# Patient Record
Sex: Female | Born: 1940
Health system: Southern US, Community
[De-identification: ages and names within clinical notes are randomized; demographics above are authoritative.]

## PROBLEM LIST (undated history)

## (undated) DIAGNOSIS — M1990 Unspecified osteoarthritis, unspecified site: Secondary | ICD-10-CM

## (undated) DIAGNOSIS — Z9889 Other specified postprocedural states: Secondary | ICD-10-CM

## (undated) DIAGNOSIS — I10 Essential (primary) hypertension: Secondary | ICD-10-CM

## (undated) DIAGNOSIS — K621 Rectal polyp: Secondary | ICD-10-CM

## (undated) DIAGNOSIS — K648 Other hemorrhoids: Secondary | ICD-10-CM

## (undated) DIAGNOSIS — Z972 Presence of dental prosthetic device (complete) (partial): Secondary | ICD-10-CM

## (undated) DIAGNOSIS — R112 Nausea with vomiting, unspecified: Secondary | ICD-10-CM

## (undated) DIAGNOSIS — M51379 Other intervertebral disc degeneration, lumbosacral region without mention of lumbar back pain or lower extremity pain: Secondary | ICD-10-CM

## (undated) DIAGNOSIS — D369 Benign neoplasm, unspecified site: Secondary | ICD-10-CM

## (undated) DIAGNOSIS — M5137 Other intervertebral disc degeneration, lumbosacral region: Secondary | ICD-10-CM

## (undated) DIAGNOSIS — D259 Leiomyoma of uterus, unspecified: Secondary | ICD-10-CM

## (undated) DIAGNOSIS — N3010 Interstitial cystitis (chronic) without hematuria: Secondary | ICD-10-CM

## (undated) DIAGNOSIS — K635 Polyp of colon: Secondary | ICD-10-CM

## (undated) DIAGNOSIS — E785 Hyperlipidemia, unspecified: Secondary | ICD-10-CM

## (undated) DIAGNOSIS — R42 Dizziness and giddiness: Secondary | ICD-10-CM

## (undated) DIAGNOSIS — R3989 Other symptoms and signs involving the genitourinary system: Secondary | ICD-10-CM

## (undated) DIAGNOSIS — Z973 Presence of spectacles and contact lenses: Secondary | ICD-10-CM

## (undated) DIAGNOSIS — R001 Bradycardia, unspecified: Secondary | ICD-10-CM

## (undated) HISTORY — DX: Other hemorrhoids: K64.8

## (undated) HISTORY — DX: Other intervertebral disc degeneration, lumbosacral region: M51.37

## (undated) HISTORY — PX: CARPAL TUNNEL RELEASE: SHX101

## (undated) HISTORY — DX: Essential (primary) hypertension: I10

## (undated) HISTORY — DX: Rectal polyp: K62.1

## (undated) HISTORY — DX: Benign neoplasm, unspecified site: D36.9

## (undated) HISTORY — DX: Polyp of colon: K63.5

## (undated) HISTORY — DX: Dizziness and giddiness: R42

## (undated) HISTORY — DX: Hyperlipidemia, unspecified: E78.5

## (undated) HISTORY — DX: Other intervertebral disc degeneration, lumbosacral region without mention of lumbar back pain or lower extremity pain: M51.379

---

## 1986-07-26 HISTORY — PX: DILATION AND CURETTAGE OF UTERUS: SHX78

## 1997-11-29 ENCOUNTER — Ambulatory Visit (HOSPITAL_COMMUNITY): Admission: RE | Admit: 1997-11-29 | Discharge: 1997-11-29 | Payer: Self-pay | Admitting: Family Medicine

## 1999-07-22 ENCOUNTER — Other Ambulatory Visit: Admission: RE | Admit: 1999-07-22 | Discharge: 1999-07-22 | Payer: Self-pay | Admitting: *Deleted

## 1999-08-14 ENCOUNTER — Encounter: Admission: RE | Admit: 1999-08-14 | Discharge: 1999-08-14 | Payer: Self-pay | Admitting: *Deleted

## 1999-08-14 ENCOUNTER — Encounter: Payer: Self-pay | Admitting: Family Medicine

## 2000-08-19 ENCOUNTER — Other Ambulatory Visit: Admission: RE | Admit: 2000-08-19 | Discharge: 2000-08-19 | Payer: Self-pay | Admitting: *Deleted

## 2001-01-22 ENCOUNTER — Emergency Department (HOSPITAL_COMMUNITY): Admission: EM | Admit: 2001-01-22 | Discharge: 2001-01-22 | Payer: Self-pay | Admitting: Emergency Medicine

## 2001-02-27 ENCOUNTER — Encounter: Payer: Self-pay | Admitting: Urology

## 2001-02-27 ENCOUNTER — Encounter: Admission: RE | Admit: 2001-02-27 | Discharge: 2001-02-27 | Payer: Self-pay | Admitting: Urology

## 2001-09-26 ENCOUNTER — Other Ambulatory Visit: Admission: RE | Admit: 2001-09-26 | Discharge: 2001-09-26 | Payer: Self-pay | Admitting: Obstetrics & Gynecology

## 2003-03-25 ENCOUNTER — Other Ambulatory Visit: Admission: RE | Admit: 2003-03-25 | Discharge: 2003-03-25 | Payer: Self-pay | Admitting: Obstetrics & Gynecology

## 2004-04-12 ENCOUNTER — Encounter: Admission: RE | Admit: 2004-04-12 | Discharge: 2004-04-12 | Payer: Self-pay | Admitting: Family Medicine

## 2004-06-15 ENCOUNTER — Other Ambulatory Visit: Admission: RE | Admit: 2004-06-15 | Discharge: 2004-06-15 | Payer: Self-pay | Admitting: Obstetrics & Gynecology

## 2004-06-23 ENCOUNTER — Emergency Department: Payer: Self-pay | Admitting: General Practice

## 2004-07-01 ENCOUNTER — Encounter: Admission: RE | Admit: 2004-07-01 | Discharge: 2004-07-01 | Payer: Self-pay | Admitting: Chiropractic Medicine

## 2005-01-22 ENCOUNTER — Ambulatory Visit (HOSPITAL_COMMUNITY): Admission: RE | Admit: 2005-01-22 | Discharge: 2005-01-22 | Payer: Self-pay | Admitting: Obstetrics & Gynecology

## 2005-10-13 ENCOUNTER — Other Ambulatory Visit: Admission: RE | Admit: 2005-10-13 | Discharge: 2005-10-13 | Payer: Self-pay | Admitting: Obstetrics & Gynecology

## 2006-01-27 ENCOUNTER — Emergency Department (HOSPITAL_COMMUNITY): Admission: EM | Admit: 2006-01-27 | Discharge: 2006-01-27 | Payer: Self-pay | Admitting: Emergency Medicine

## 2006-02-14 ENCOUNTER — Emergency Department: Payer: Self-pay | Admitting: Emergency Medicine

## 2007-06-26 ENCOUNTER — Encounter: Payer: Self-pay | Admitting: Family Medicine

## 2007-07-27 HISTORY — PX: COLONOSCOPY W/ POLYPECTOMY: SHX1380

## 2007-11-20 ENCOUNTER — Encounter: Payer: Self-pay | Admitting: Family Medicine

## 2008-09-30 ENCOUNTER — Encounter: Payer: Self-pay | Admitting: Family Medicine

## 2009-02-14 ENCOUNTER — Encounter: Payer: Self-pay | Admitting: Family Medicine

## 2009-05-30 ENCOUNTER — Encounter: Payer: Self-pay | Admitting: Family Medicine

## 2009-05-30 ENCOUNTER — Encounter: Admission: RE | Admit: 2009-05-30 | Discharge: 2009-05-30 | Payer: Self-pay | Admitting: Orthopedic Surgery

## 2009-07-16 ENCOUNTER — Ambulatory Visit: Payer: Self-pay | Admitting: Family Medicine

## 2009-07-16 DIAGNOSIS — I10 Essential (primary) hypertension: Secondary | ICD-10-CM | POA: Insufficient documentation

## 2009-07-16 DIAGNOSIS — R42 Dizziness and giddiness: Secondary | ICD-10-CM

## 2009-07-16 DIAGNOSIS — E785 Hyperlipidemia, unspecified: Secondary | ICD-10-CM | POA: Insufficient documentation

## 2009-07-16 DIAGNOSIS — H6120 Impacted cerumen, unspecified ear: Secondary | ICD-10-CM

## 2009-10-03 ENCOUNTER — Ambulatory Visit: Payer: Self-pay | Admitting: Family Medicine

## 2009-10-03 DIAGNOSIS — E559 Vitamin D deficiency, unspecified: Secondary | ICD-10-CM

## 2009-10-06 LAB — CONVERTED CEMR LAB
ALT: 18 units/L (ref 0–35)
Albumin: 3.9 g/dL (ref 3.5–5.2)
Alkaline Phosphatase: 51 units/L (ref 39–117)
BUN: 12 mg/dL (ref 6–23)
Bilirubin, Direct: 0 mg/dL (ref 0.0–0.3)
CO2: 30 meq/L (ref 19–32)
Chloride: 103 meq/L (ref 96–112)
Creatinine, Ser: 0.6 mg/dL (ref 0.4–1.2)
Direct LDL: 134.5 mg/dL
GFR calc non Af Amer: 106.08 mL/min (ref >60)
Glucose, Bld: 101 mg/dL — ABNORMAL HIGH (ref 70–99)
HDL: 41.8 mg/dL (ref >39.00)
Triglycerides: 225 mg/dL — ABNORMAL HIGH (ref 0.0–149.0)
Vit D, 25-Hydroxy: 41 ng/mL (ref 30–89)

## 2009-11-18 ENCOUNTER — Ambulatory Visit (HOSPITAL_BASED_OUTPATIENT_CLINIC_OR_DEPARTMENT_OTHER): Admission: RE | Admit: 2009-11-18 | Discharge: 2009-11-18 | Payer: Self-pay | Admitting: Orthopedic Surgery

## 2009-11-18 HISTORY — PX: KNEE ARTHROSCOPY: SUR90

## 2009-11-28 LAB — HM DEXA SCAN: HM Dexa Scan: NORMAL

## 2009-12-15 ENCOUNTER — Ambulatory Visit: Payer: Self-pay | Admitting: Family Medicine

## 2009-12-15 DIAGNOSIS — J018 Other acute sinusitis: Secondary | ICD-10-CM

## 2010-01-15 ENCOUNTER — Telehealth: Payer: Self-pay | Admitting: Family Medicine

## 2010-02-26 ENCOUNTER — Ambulatory Visit: Payer: Self-pay | Admitting: Family Medicine

## 2010-02-26 LAB — CONVERTED CEMR LAB
Specific Gravity, Urine: 1.01
Urobilinogen, UA: 0.2
WBC Urine, dipstick: NEGATIVE

## 2010-03-02 DIAGNOSIS — R300 Dysuria: Secondary | ICD-10-CM

## 2010-03-03 ENCOUNTER — Encounter: Payer: Self-pay | Admitting: Family Medicine

## 2010-03-05 ENCOUNTER — Encounter: Payer: Self-pay | Admitting: Family Medicine

## 2010-03-12 ENCOUNTER — Encounter: Admission: RE | Admit: 2010-03-12 | Discharge: 2010-03-12 | Payer: Self-pay | Admitting: Obstetrics & Gynecology

## 2010-04-06 ENCOUNTER — Ambulatory Visit: Payer: Self-pay | Admitting: Family Medicine

## 2010-04-06 DIAGNOSIS — G4700 Insomnia, unspecified: Secondary | ICD-10-CM

## 2010-04-06 HISTORY — DX: Insomnia, unspecified: G47.00

## 2010-04-06 LAB — CONVERTED CEMR LAB
Bilirubin Urine: NEGATIVE
Glucose, Urine, Semiquant: NEGATIVE
Ketones, urine, test strip: NEGATIVE
Nitrite: NEGATIVE
Protein, U semiquant: NEGATIVE

## 2010-04-22 ENCOUNTER — Encounter: Payer: Self-pay | Admitting: Family Medicine

## 2010-05-09 ENCOUNTER — Emergency Department (HOSPITAL_COMMUNITY): Admission: EM | Admit: 2010-05-09 | Discharge: 2010-05-09 | Payer: Self-pay | Admitting: Emergency Medicine

## 2010-05-12 ENCOUNTER — Ambulatory Visit: Payer: Self-pay | Admitting: Family Medicine

## 2010-05-27 ENCOUNTER — Telehealth: Payer: Self-pay | Admitting: Family Medicine

## 2010-07-03 ENCOUNTER — Telehealth: Payer: Self-pay | Admitting: Family Medicine

## 2010-08-07 ENCOUNTER — Telehealth: Payer: Self-pay | Admitting: Family Medicine

## 2010-08-16 ENCOUNTER — Encounter: Payer: Self-pay | Admitting: Orthopedic Surgery

## 2010-08-27 ENCOUNTER — Other Ambulatory Visit: Payer: Self-pay | Admitting: Obstetrics & Gynecology

## 2010-08-27 NOTE — Assessment & Plan Note (Signed)
Summary: PROBLEMS WITH HIGH BP / LFW   Vital Signs:  Patient profile:   70 year old female Height:      65.5 inches Weight:      208 pounds BMI:     34.21 Temp:     97.8 degrees F oral Pulse rate:   68 / minute Pulse rhythm:   regular BP sitting:   152 / 90  (left arm) Cuff size:   large  Vitals Entered By: Linde Gillis CMA Duncan Dull) (May 12, 2010 10:57 AM) CC: elevated blood pressure   History of Present Illness: 70 yo here for ER follow up.  Felt funny on 10/15, checked her BP at home and it was 190s/100.  Called EMS, taken to [facility].  Had a bad headache and felt a little disoriented.  No CP, SOB, nausea or vomiting.  No focal neurological deficits.  On arrival to ED BP was 189/94. Notes reviewed, BP trended down to 118/48 without additional medicaiton. Head CT was negative.  Has been on HCTZ 25 mg for some time.  This has never happened to her before.  Earlier in the day, prior to developping symptoms, received steroid shots in her knees at her Orthopedist office (Dr. Simonne Come).  Never had steroid injections in past.    HA has now resolved. Checking her BP every day, this morning was 127/74.  Current Medications (verified): 1)  Hydrochlorothiazide 25 Mg Tabs (Hydrochlorothiazide) .... Take One Tablet By Mouth Daily 2)  Valium 5 Mg Tabs (Diazepam) .... Take One Tablet By Mouth As Needed For Vertigo 3)  Tylenol Extra Strength 1000 Mg/48ml Liqd (Acetaminophen) .... Take One Tablet By Mouth As Needed 4)  Tramadol Hcl 50 Mg Tabs (Tramadol Hcl) .... As Needed For Pain (Knee) 5)  Meclizine Hcl 25 Mg Tabs (Meclizine Hcl) .Marland Kitchen.. 1 By Mouth Two Times A Day As Needed Vertigo 6)  Lunesta 2 Mg Tabs (Eszopiclone) .Marland Kitchen.. 1 Tab Nightly As Needed For Insomnia. 7)  Hydrocodone-Acetaminophen 7.5-500 Mg Tabs (Hydrocodone-Acetaminophen) .... Take One Tablet By Mouth Every 4-6 Hours As Needed Pain  Allergies: 1)  ! Sulfa 2)  ! Codeine  Past History:  Past Medical History: Last updated:  08/15/2009 Hyperlipidemia Hypertension Vertigo Right knee meniscal tear  Past Surgical History: Last updated: 2009/08/15 Denies surgical history  Family History: Last updated: August 15, 2009 Mom died at 53 of PE Dad died at 1, MI, COPD  Social History: Last updated: Aug 15, 2009 Lives with husband in [place]. Quit smoking when she was 70 yo. Married Alcohol use-no Drug use-no  Review of Systems      See HPI General:  Denies malaise. Eyes:  Denies blurring. CV:  Denies chest pain or discomfort. Resp:  Denies pleuritic. Neuro:  Denies headaches, inability to speak, numbness, poor balance, seizures, sensation of room spinning, tremors, visual disturbances, and weakness.  Physical Exam  General:  Well-developed,well-nourished,in no acute distress; alert,appropriate and cooperative throughout examination slightly hypertensive Lungs:  Normal respiratory effort, chest expands symmetrically. Lungs are clear to auscultation, no crackles or wheezes. Heart:  Normal rate and regular rhythm. S1 and S2 normal without gallop, murmur, click, rub or other extra sounds. Extremities:  No clubbing, cyanosis, edema, or deformity noted with normal full range of motion of all joints.   Neurologic:  No cranial nerve deficits noted. Station and gait are normal. Plantar reflexes are down-going bilaterally. DTRs are symmetrical throughout. Sensory, motor and coordinative functions appear intact. Psych:  Cognition and judgment appear intact. Alert and cooperative with normal attention span  and concentration. No apparent delusions, illusions, hallucinations   Impression & Recommendations:  Problem # 1:  HYPERTENSION (ICD-401.9) Assessment Deteriorated ER notes reviewed, normotensive prior to D/c and has been normotensive at home. Has h/o white coat HTN. I suspect this incident of increased BP was driven by a reaciton to the steroid shots she received at orthopedist.  Will call for records and add meds  to her allergy list.  Continue current dose of HCTZ. Pt to call on Friday with update of BPs. Her updated medication list for this problem includes:    Hydrochlorothiazide 25 Mg Tabs (Hydrochlorothiazide) .Marland Kitchen... Take one tablet by mouth daily  Complete Medication List: 1)  Hydrochlorothiazide 25 Mg Tabs (Hydrochlorothiazide) .... Take one tablet by mouth daily 2)  Valium 5 Mg Tabs (Diazepam) .... Take one tablet by mouth as needed for vertigo 3)  Tylenol Extra Strength 1000 Mg/14ml Liqd (Acetaminophen) .... Take one tablet by mouth as needed 4)  Tramadol Hcl 50 Mg Tabs (Tramadol hcl) .... As needed for pain (knee) 5)  Meclizine Hcl 25 Mg Tabs (Meclizine hcl) .Marland Kitchen.. 1 by mouth two times a day as needed vertigo 6)  Lunesta 2 Mg Tabs (Eszopiclone) .Marland Kitchen.. 1 tab nightly as needed for insomnia. 7)  Hydrocodone-acetaminophen 7.5-500 Mg Tabs (Hydrocodone-acetaminophen) .... Take one tablet by mouth every 4-6 hours as needed pain   Orders Added: 1)  Est. Patient Level IV [04540]    Current Allergies (reviewed today): ! SULFA ! CODEINE

## 2010-08-27 NOTE — Assessment & Plan Note (Signed)
Summary: TROUBLE SLEEPING,/CLE   Vital Signs:  Patient profile:   70 year old female Height:      65.5 inches Weight:      211 pounds BMI:     34.70 Temp:     97.7 degrees F oral Pulse rate:   72 / minute Pulse rhythm:   regular BP sitting:   140 / 90  (left arm) Cuff size:   large  Vitals Entered By: Linde Gillis CMA Duncan Dull) (April 06, 2010 10:52 AM) CC: trouble sleeping, ?UTI   History of Present Illness: 70 yo here to discuss several issues.  1.  ?UTI- over month 1/2 of dysuria, suprapubic pressure.  Has been seeing Dr. Jennette Kettle, OBGYN.  UAs at Dr. Donnetta Hail office and here have been negative.  No hematuria, fevers, back pain, nausea or vomiting. Dr. Jennette Kettle gave her topical estrogen which has not helped.  2.  Trouble sleeping- cannot fall or stay asleep.  Once she is asleep, she wakes up to use the bathroom and cannot fall back asleep.  Valium does not help.  Knows part of the issue is that she is fearful that something is wrong with her, like bladder cancer. Former smoker.  Current Medications (verified): 1)  Hydrochlorothiazide 25 Mg Tabs (Hydrochlorothiazide) .... Take One Tablet By Mouth Daily 2)  Valium 5 Mg Tabs (Diazepam) .... Take One Tablet By Mouth As Needed For Vertigo 3)  Tylenol Extra Strength 1000 Mg/74ml Liqd (Acetaminophen) .... Take One Tablet By Mouth As Needed 4)  Tramadol Hcl 50 Mg Tabs (Tramadol Hcl) .... As Needed For Pain (Knee) 5)  Meclizine Hcl 25 Mg Tabs (Meclizine Hcl) .Marland Kitchen.. 1 By Mouth Two Times A Day As Needed Vertigo 6)  Lunesta 2 Mg Tabs (Eszopiclone) .Marland Kitchen.. 1 Tab Nightly As Needed For Insomnia.  Allergies: 1)  ! Sulfa 2)  ! Codeine  Past History:  Past Medical History: Last updated: 08-14-2009 Hyperlipidemia Hypertension Vertigo Right knee meniscal tear  Past Surgical History: Last updated: 08-14-09 Denies surgical history  Family History: Last updated: 08-14-09 Mom died at 45 of PE Dad died at 48, MI, COPD  Social History: Last  updated: 2009-08-14 Lives with husband in [place]. Quit smoking when she was 70 yo. Married Alcohol use-no Drug use-no  Review of Systems      See HPI General:  Denies chills and fever. GI:  Complains of abdominal pain; denies nausea and vomiting. GU:  Complains of dysuria, nocturia, and urinary frequency; denies hematuria, incontinence, and urinary hesitancy. Psych:  Denies sense of great danger, suicidal thoughts/plans, thoughts of violence, unusual visions or sounds, and thoughts /plans of harming others.  Physical Exam  General:  Well-developed,well-nourished,in no acute distress; alert,appropriate and cooperative throughout examination slightly hypertensive Lungs:  Normal respiratory effort, chest expands symmetrically. Lungs are clear to auscultation, no crackles or wheezes. Heart:  Normal rate and regular rhythm. S1 and S2 normal without gallop, murmur, click, rub or other extra sounds. Abdomen:  Bowel sounds positive,abdomen soft and non-tender without masses, organomegaly or hernias noted. NO CVA or suprapubic tenderness Psych:  Cognition and judgment appear intact. Alert and cooperative with normal attention span and concentration. No apparent delusions, illusions, hallucinations   Impression & Recommendations:  Problem # 1:  DYSURIA (ICD-788.1) Assessment Deteriorated UA again negative. Chronic issue, will refer to urology for further workup and possible urodynamics/cystoscopy. ?possible IC. Orders: UA Dipstick w/o Micro (manual) (16109) Urology Referral (Urology)  Problem # 2:  INSOMNIA (ICD-780.52) Assessment: New Chronic issue that is worsened  by above. Will give short course of Lunesta until our urology work up is complete.  Pt in agreement with plan. Discussed sleep hygiene as well. Her updated medication list for this problem includes:    Lunesta 2 Mg Tabs (Eszopiclone) .Marland Kitchen... 1 tab nightly as needed for insomnia.  Complete Medication List: 1)   Hydrochlorothiazide 25 Mg Tabs (Hydrochlorothiazide) .... Take one tablet by mouth daily 2)  Valium 5 Mg Tabs (Diazepam) .... Take one tablet by mouth as needed for vertigo 3)  Tylenol Extra Strength 1000 Mg/31ml Liqd (Acetaminophen) .... Take one tablet by mouth as needed 4)  Tramadol Hcl 50 Mg Tabs (Tramadol hcl) .... As needed for pain (knee) 5)  Meclizine Hcl 25 Mg Tabs (Meclizine hcl) .Marland Kitchen.. 1 by mouth two times a day as needed vertigo 6)  Lunesta 2 Mg Tabs (Eszopiclone) .Marland Kitchen.. 1 tab nightly as needed for insomnia.  Patient Instructions: 1)  Please stop by to see Shirlee Limerick on your way out. Prescriptions: LUNESTA 2 MG TABS (ESZOPICLONE) 1 tab nightly as needed for insomnia.  #30 x 0   Entered and Authorized by:   Ruthe Mannan MD   Signed by:   Ruthe Mannan MD on 04/06/2010   Method used:   Print then Give to Patient   RxID:   [id number]   Current Allergies (reviewed today): ! SULFA ! CODEINE Laboratory Results   Urine Tests  Date/Time Received: April 06, 2010 11:10 AM   Routine Urinalysis   Color: yellow Appearance: Clear Glucose: negative   (Normal Range: Negative) Bilirubin: negative   (Normal Range: Negative) Ketone: negative   (Normal Range: Negative) Spec. Gravity: 1.010   (Normal Range: 1.003-1.035) Blood: negative   (Normal Range: Negative) pH: 5.0   (Normal Range: 5.0-8.0) Protein: negative   (Normal Range: Negative) Urobilinogen: 0.2   (Normal Range: 0-1) Nitrite: negative   (Normal Range: Negative) Leukocyte Esterace: negative   (Normal Range: Negative)        Last PAP:  normal (04/02/2009 2:31:54 PM) PAP Result Date:  03/11/2010 PAP Result:  normal Last Mammogram:  normal (03/27/2009 2:31:54 PM) Mammogram Result Date:  03/10/2010 Mammogram Result:  normal Bone Density Result Date:  03/05/2010 Bone Density Result:  normal

## 2010-08-27 NOTE — Progress Notes (Signed)
Summary: Meclizine  Phone Note Call from Patient Call back at Home Phone [phone] Call back at Cell:  [phone]   Caller: Patient Call For: Ruthe Mannan MD Summary of Call: Patient says you have not prescribed Meclizine in the past but you are aware that she takes it for dizziness.  She is down to 1 tablet left.  [facility]. Initial call taken by: Delilah Shan CMA Duncan Dull),  January 15, 2010 9:47 AM    New/Updated Medications: MECLIZINE HCL 25 MG TABS (MECLIZINE HCL) 1 by mouth two times a day as needed vertigo Prescriptions: MECLIZINE HCL 25 MG TABS (MECLIZINE HCL) 1 by mouth two times a day as needed vertigo  #60 x 0   Entered and Authorized by:   Ruthe Mannan MD   Signed by:   Ruthe Mannan MD on 01/15/2010   Method used:   [facility]ronically to        [facility] [id number] (retail)       [address]       [place], [place]  [address]       Ph: [phone] or [phone]       Fax: [phone]   RxID:   [id number]   Appended Document: Meclizine Patient Advised.

## 2010-08-27 NOTE — Letter (Signed)
Summary: Records from Dr. Harlene Salts Office 2005 - 2010  Records from Dr. Harlene Salts Office 2005 - 2010   Imported By: Maryln Gottron 03/06/2010 13:18:44  _____________________________________________________________________  External Attachment:    Type:   Image     Comment:   External Document

## 2010-08-27 NOTE — Assessment & Plan Note (Signed)
Summary: CPX/RBH   Vital Signs:  Patient profile:   70 year old female Height:      65.5 inches Weight:      214.13 pounds BMI:     35.22 Temp:     97.7 degrees F oral Pulse rate:   84 / minute Pulse rhythm:   regular BP sitting:   122 / 82  (left arm) Cuff size:   large  Vitals Entered By: Delilah Shan CMA Duncan Dull) (October 03, 2009 9:12 AM) CC: CPX   History of Present Illness: 70 yo here to for CPX.  HTN- on HCTZ 25 mg.  BPs were elevated at last OV, much improved today.  She has lost 4 pounds! and is working on exercising more.  Has been under great deal of stress since her son had an accident in January and she has been helping to rehabilitate him.   No CP, SOB, HA or blurred vision.  Hyperlipidem- was on Lipitor. Gave her myalgias and nausea.   Not taking anything right now.  Due for FLP.  Right knee meniscal tear- see scanned MRI report. Was supposed to have surgical repair in February but rescheduled for next month due to son's accident.    Well woman- UTD on mammogram, bone scan, pap smear (sees Dr. Jennette Kettle).   She wants to wait for colonoscopy until after knee surgery.   We do not have Zostavax available until June.  Current Medications (verified): 1)  Hydrochlorothiazide 25 Mg Tabs (Hydrochlorothiazide) .... Take One Tablet By Mouth Daily 2)  Valium 5 Mg Tabs (Diazepam) .... Take One Tablet By Mouth As Needed For Vertigo 3)  Tylenol Extra Strength 1000 Mg/44ml Liqd (Acetaminophen) .... Take One Tablet By Mouth As Needed 4)  Tramadol Hcl 50 Mg Tabs (Tramadol Hcl) .... As Needed For Pain (Knee) 5)  Meclizine Hcl 25 Mg Tabs (Meclizine Hcl) .... Occasionally For Vertigo  Allergies: 1)  ! Sulfa 2)  ! Codeine  Family History: Reviewed history from 07/16/2009 and no changes required. Mom died at 17 of PE Dad died at 11, MI, COPD  Social History: Reviewed history from 07/16/2009 and no changes required. Lives with husband in [place]. Quit smoking when she was 70  yo. Married Alcohol use-no Drug use-no  Review of Systems      See HPI General:  Denies chills, fever, and malaise. CV:  Denies chest pain or discomfort. Resp:  Denies shortness of breath. GI:  Denies abdominal pain. MS:  Denies muscle weakness. Derm:  Denies rash. Neuro:  Denies sensation of room spinning and visual disturbances. Psych:  Denies anxiety and depression.  Physical Exam  General:  Well-developed,well-nourished,in no acute distress; alert,appropriate and cooperative throughout examination Vs- lost 4 pounds, BP great! Ears:  R ear normal and L ear normal.   Mouth:  Oral mucosa and oropharynx without lesions or exudates.  Teeth in good repair. Lungs:  Normal respiratory effort, chest expands symmetrically. Lungs are clear to auscultation, no crackles or wheezes. Heart:  Normal rate and regular rhythm. S1 and S2 normal without gallop, murmur, click, rub or other extra sounds. Abdomen:  Bowel sounds positive,abdomen soft and non-tender without masses, organomegaly or hernias noted. Extremities:  No clubbing, cyanosis, edema, or deformity noted with normal full range of motion of all joints.   Skin:  Intact without suspicious lesions or rashes Psych:  Cognition and judgment appear intact. Alert and cooperative with normal attention span and concentration. No apparent delusions, illusions, hallucinations   Impression & Recommendations:  Problem # 1:  HYPERTENSION (ICD-401.9) Assessment Improved Congratulated her on weight loss and changes in diet. Looks great. Her updated medication list for this problem includes:    Hydrochlorothiazide 25 Mg Tabs (Hydrochlorothiazide) .Marland Kitchen... Take one tablet by mouth daily  Orders: TLB-BMP (Basic Metabolic Panel-BMET) (80048-METABOL)  Problem # 2:  HYPERLIPIDEMIA (ICD-272.4) Assessment: Unchanged Recheck Lipid panel, LFTs today.  Will likely need to start a statin, perhaps she will tolerate Pravachol better than  Lipitor. Orders: Venipuncture ([address]) TLB-Lipid Panel (80061-LIPID) TLB-Hepatic/Liver Function Pnl (80076-HEPATIC)  Problem # 3:  Preventive Health Care (ICD-V70.0) Reviewed preventive care protocols, scheduled due services, and updated immunizations Discussed nutrition, exercise, diet, and healthy lifestyle.  FLP, BMET today. Discussed colonoscopy, she would like to wait until after she has her knee surgery. Will need Zostavax when we get them.   Problem # 4:  VITAMIN D DEFICIENCY (ICD-268.9) Assessment: Unchanged Will recheck Vit D today, has been taking OTC supplements, she can not recall exact amount.  she will call back when she gets home with those details. Orders: Venipuncture (60454) T-Vitamin D (25-Hydroxy) (09811-91478) Specimen Handling (29562)  Complete Medication List: 1)  Hydrochlorothiazide 25 Mg Tabs (Hydrochlorothiazide) .... Take one tablet by mouth daily 2)  Valium 5 Mg Tabs (Diazepam) .... Take one tablet by mouth as needed for vertigo 3)  Tylenol Extra Strength 1000 Mg/51ml Liqd (Acetaminophen) .... Take one tablet by mouth as needed 4)  Tramadol Hcl 50 Mg Tabs (Tramadol hcl) .... As needed for pain (knee) 5)  Meclizine Hcl 25 Mg Tabs (Meclizine hcl) .... Occasionally for vertigo  Current Allergies (reviewed today): ! SULFA ! CODEINE

## 2010-08-27 NOTE — Progress Notes (Signed)
Summary: refill request for lunesta  Phone Note Refill Request Call back at [facility] Phone [phone] Call back at [phone] Message from:  Patient  Refills Requested: Medication #1:  LUNESTA 2 MG TABS 1 tab nightly as needed for insomnia. Pt states she is waking too early with this dose.  She tried her husbands 3 mg's and that worked better for her. She would like to increase to 3 mg's.   [facility] [place]  Initial call taken by: Lowella Petties CMA, AAMA,  May 27, 2010 4:51 PM  Follow-up for Phone Call        Rx called to pharmacy, patient notified. Follow-up by: Linde Gillis CMA Duncan Dull),  May 28, 2010 8:09 AM    New/Updated Medications: LUNESTA 3 MG TABS (ESZOPICLONE) one by mouth at bedtime as needed Prescriptions: LUNESTA 3 MG TABS (ESZOPICLONE) one by mouth at bedtime as needed  #30 x 0   Entered by:   Linde Gillis CMA (AAMA)   Authorized by:   Ruthe Mannan MD   Signed by:   Linde Gillis CMA (AAMA) on 05/28/2010   Method used:   Telephoned to ...       [facility] [phone]* (retail)       [address]       [place], [place]  [address]       Ph: [phone] or [phone]       Fax: [phone]   RxID:   [id number]

## 2010-08-27 NOTE — Assessment & Plan Note (Signed)
Summary: CHECK URINE  Nurse Visit   Vital Signs:  Patient profile:   70 year old female Height:      65.5 inches Temp:     98.4 degrees F oral Pulse rate:   72 / minute Pulse rhythm:   regular  Vitals Entered By: Linde Gillis CMA Duncan Dull) (February 26, 2010 3:35 PM) CC: ? UTI  Patient stated she was having a little burning upon urination that started about three days ago and wanted to make sure she didn't have a UTI.  Urine was clear, no signs of infection.  Advised patient to drink plenty of water, cranberry juice.  Advised her that we would send urine for culture and we would call her when results are in.  Advised her to call us back if symptoms worsen or new symptoms develop.  Linde Gillis CMA Duncan Dull)  February 26, 2010 3:37 PM    Allergies: 1)  ! Sulfa 2)  ! Codeine Laboratory Results   Urine Tests  Date/Time Received: February 26, 2010 3:37 PM   Routine Urinalysis   Color: lt. yellow Appearance: Clear Glucose: negative   (Normal Range: Negative) Bilirubin: negative   (Normal Range: Negative) Ketone: negative   (Normal Range: Negative) Spec. Gravity: 1.010   (Normal Range: 1.003-1.035) Blood: negative   (Normal Range: Negative) pH: 5.0   (Normal Range: 5.0-8.0) Protein: negative   (Normal Range: Negative) Urobilinogen: 0.2   (Normal Range: 0-1) Nitrite: negative   (Normal Range: Negative) Leukocyte Esterace: negative   (Normal Range: Negative)       Appended Document: CHECK URINE

## 2010-08-27 NOTE — Progress Notes (Signed)
Summary: lunesta  Phone Note Refill Request Message from:  Fax from Pharmacy on August 07, 2010 11:32 AM  Refills Requested: Medication #1:  LUNESTA 3 MG TABS one by mouth at bedtime as needed   Last Refilled: 07/03/2010 [phone]  Initial call taken by: Melody Comas,  August 07, 2010 11:32 AM  Follow-up for Phone Call        Rx called to pharmacy Follow-up by: Linde Gillis CMA Duncan Dull),  August 07, 2010 12:04 PM    Prescriptions: LUNESTA 3 MG TABS (ESZOPICLONE) one by mouth at bedtime as needed  #30 x 0   Entered and Authorized by:   Ruthe Mannan MD   Signed by:   Ruthe Mannan MD on 08/07/2010   Method used:   Telephoned to ...       [facility] [id number]* (retail)       [address]       [place], [place]  [address]       Ph: [phone] or [phone]       Fax: [phone]   RxID:   [id number]

## 2010-08-27 NOTE — Assessment & Plan Note (Signed)
Summary: STREP??   Vital Signs:  Patient profile:   70 year old female Height:      65.5 inches Weight:      214.50 pounds BMI:     35.28 Temp:     99.0 degrees F oral Pulse rate:   68 / minute Pulse rhythm:   regular BP sitting:   142 / 80  (right arm) Cuff size:   large  Vitals Entered By: Linde Gillis CMA Duncan Dull) (Dec 15, 2009 2:54 PM) CC: ? Strep throat   History of Present Illness: 70 yo here for ? strep.  Over 1 week ago was around her grandson that had strep throat. Since that time, she has had worsening nasal congestion, dry cough, sinus pressure. Subjective fever, body aches. Pain with swallowing. No nausea, vomiting or diarrhea. No rash.  Current Medications (verified): 1)  Hydrochlorothiazide 25 Mg Tabs (Hydrochlorothiazide) .... Take One Tablet By Mouth Daily 2)  Valium 5 Mg Tabs (Diazepam) .... Take One Tablet By Mouth As Needed For Vertigo 3)  Tylenol Extra Strength 1000 Mg/38ml Liqd (Acetaminophen) .... Take One Tablet By Mouth As Needed 4)  Tramadol Hcl 50 Mg Tabs (Tramadol Hcl) .... As Needed For Pain (Knee) 5)  Meclizine Hcl 25 Mg Tabs (Meclizine Hcl) .... Occasionally For Vertigo 6)  Amoxicillin 500 Mg Tabs (Amoxicillin) .Marland Kitchen.. 1 Tab By Mouth Two Times A Day X 10 Days  Allergies: 1)  ! Sulfa 2)  ! Codeine  Review of Systems      See HPI General:  Complains of chills and fever. ENT:  Complains of postnasal drainage, sinus pressure, and sore throat. Resp:  Complains of cough; denies shortness of breath, sputum productive, and wheezing.  Physical Exam  General:  Well-developed,well-nourished,in no acute distress; alert,appropriate and cooperative throughout examination Vs- lost 4 pounds, BP great! Ears:  TMs retracted bilaterally Nose:  boggy turbinates Mouth:  pharyngeal erythema.   Lungs:  Normal respiratory effort, chest expands symmetrically. Lungs are clear to auscultation, no crackles or wheezes. Heart:  Normal rate and regular rhythm. S1 and  S2 normal without gallop, murmur, click, rub or other extra sounds. Psych:  Cognition and judgment appear intact. Alert and cooperative with normal attention span and concentration. No apparent delusions, illusions, hallucinations   Impression & Recommendations:  Problem # 1:  OTHER ACUTE SINUSITIS (ICD-461.8) Assessment New  Given duration and progression of symptoms ,will treat for bacterial sinusitis with amoxicillin. See pt instructions for details. Her updated medication list for this problem includes:    Amoxicillin 500 Mg Tabs (Amoxicillin) .Marland Kitchen... 1 tab by mouth two times a day x 10 days  Orders: Prescription Created Electronically [phone])  Complete Medication List: 1)  Hydrochlorothiazide 25 Mg Tabs (Hydrochlorothiazide) .... Take one tablet by mouth daily 2)  Valium 5 Mg Tabs (Diazepam) .... Take one tablet by mouth as needed for vertigo 3)  Tylenol Extra Strength 1000 Mg/29ml Liqd (Acetaminophen) .... Take one tablet by mouth as needed 4)  Tramadol Hcl 50 Mg Tabs (Tramadol hcl) .... As needed for pain (knee) 5)  Meclizine Hcl 25 Mg Tabs (Meclizine hcl) .... Occasionally for vertigo 6)  Amoxicillin 500 Mg Tabs (Amoxicillin) .Marland Kitchen.. 1 tab by mouth two times a day x 10 days  Patient Instructions: 1)  Take Amoxcillin as directed.  Drink lots of fluids.  Treat sympotmatically with Mucinex, nasal saline irrigation, and Tylenol/Ibuprofen. You can use warm compresses.  Cough suppressant at night. Call if not improving as expected in 5-7 days.  Prescriptions: AMOXICILLIN 500 MG TABS (AMOXICILLIN) 1 tab by mouth two times a day x 10 days  #20 x 0   Entered and Authorized by:   Ruthe Mannan MD   Signed by:   Ruthe Mannan MD on 12/15/2009   Method used:   [facility] to        [facility] [id number] (retail)       [address]       [place], [place]  [address]       Ph: [phone] or [phone]       Fax: [phone]   RxID:   [id number]   Current Allergies (reviewed  today): ! SULFA ! CODEINE

## 2010-08-27 NOTE — Progress Notes (Signed)
Summary: refill request for lunesta  Phone Note Refill Request Call back at [phone] Message from:  Patient  Refills Requested: Medication #1:  LUNESTA 3 MG TABS one by mouth at bedtime as needed [facility] [place] [phone]  Initial call taken by: Lowella Petties CMA, AAMA,  July 03, 2010 12:43 PM  Follow-up for Phone Call        Rx called to pharmacy Follow-up by: Linde Gillis CMA Duncan Dull),  July 03, 2010 12:53 PM    Prescriptions: LUNESTA 3 MG TABS (ESZOPICLONE) one by mouth at bedtime as needed  #30 x 0   Entered and Authorized by:   Ruthe Mannan MD   Signed by:   Ruthe Mannan MD on 07/03/2010   Method used:   Telephoned to ...       [facility] [id number]* (retail)       [address]       [place], [place]  [address]       Ph: [phone] or [phone]       Fax: [phone]   RxID:   [id number]

## 2010-09-01 ENCOUNTER — Encounter: Payer: Self-pay | Admitting: Family Medicine

## 2010-09-01 ENCOUNTER — Ambulatory Visit
Admission: RE | Admit: 2010-09-01 | Discharge: 2010-09-01 | Disposition: A | Discharge status: Home / Self Care | Payer: Medicare Other | Source: Ambulatory Visit | Attending: Obstetrics & Gynecology | Admitting: Obstetrics & Gynecology

## 2010-09-01 MED ORDER — GADOBENATE DIMEGLUMINE 529 MG/ML IV SOLN
19.0000 mL | Freq: Once | INTRAVENOUS | Status: AC | PRN
  Administered 2010-09-01: 19 mL via INTRAVENOUS

## 2010-09-14 ENCOUNTER — Encounter: Payer: Self-pay | Admitting: Family Medicine

## 2010-09-17 ENCOUNTER — Ambulatory Visit (INDEPENDENT_AMBULATORY_CARE_PROVIDER_SITE_OTHER): Payer: Medicare Other | Admitting: Family Medicine

## 2010-09-17 ENCOUNTER — Encounter: Payer: Self-pay | Admitting: Family Medicine

## 2010-09-17 DIAGNOSIS — R300 Dysuria: Secondary | ICD-10-CM

## 2010-09-17 LAB — CONVERTED CEMR LAB
Bilirubin Urine: NEGATIVE
Glucose, Urine, Semiquant: NEGATIVE
pH: 5.5

## 2010-09-21 ENCOUNTER — Ambulatory Visit (HOSPITAL_BASED_OUTPATIENT_CLINIC_OR_DEPARTMENT_OTHER)
Admission: RE | Admit: 2010-09-21 | Discharge: 2010-09-21 | Disposition: A | Discharge status: Home / Self Care | Payer: Medicare Other | Source: Ambulatory Visit | Attending: Urology | Admitting: Urology

## 2010-09-21 DIAGNOSIS — Z79899 Other long term (current) drug therapy: Secondary | ICD-10-CM | POA: Insufficient documentation

## 2010-09-21 DIAGNOSIS — I10 Essential (primary) hypertension: Secondary | ICD-10-CM | POA: Insufficient documentation

## 2010-09-21 DIAGNOSIS — N949 Unspecified condition associated with female genital organs and menstrual cycle: Secondary | ICD-10-CM | POA: Insufficient documentation

## 2010-09-21 DIAGNOSIS — E669 Obesity, unspecified: Secondary | ICD-10-CM | POA: Insufficient documentation

## 2010-09-21 HISTORY — PX: OTHER SURGICAL HISTORY: SHX169

## 2010-09-21 LAB — POCT I-STAT 4, (NA,K, GLUC, HGB,HCT)
Glucose, Bld: 96 mg/dL (ref 70–99)
HCT: 42 % (ref 36.0–46.0)
Potassium: 3.7 mEq/L (ref 3.5–5.1)
Sodium: 140 mEq/L (ref 135–145)

## 2010-09-22 ENCOUNTER — Encounter: Payer: Self-pay | Admitting: Family Medicine

## 2010-09-22 LAB — HM PAP SMEAR

## 2010-09-22 LAB — HM MAMMOGRAPHY

## 2010-09-22 NOTE — Letter (Signed)
Summary: [facility]   Imported By: Maryln Gottron 09/18/2010 15:35:24  _____________________________________________________________________  External Attachment:    Type:   Image     Comment:   External Document

## 2010-09-22 NOTE — Assessment & Plan Note (Signed)
Summary: F/U FROM UROLOGIST/CLE   MEDICARE/[facility] [place]   Vital Signs:  Patient profile:   70 year old female Height:      65.5 inches Weight:      208 pounds BMI:     34.21 Temp:     97.8 degrees F oral Pulse rate:   67 / minute Pulse rhythm:   regular BP sitting:   130 / 80  (left arm) Cuff size:   large  Vitals Entered By: Linde Gillis CMA Duncan Dull) (September 17, 2010 9:07 AM) CC: follow up after Urology appt   History of Present Illness: 70 yo here to follow up urology appt.  Saw Dr. Isabel Caprice on Monday for persistent dysuria with normal UA. Per pt, ?possible IC. She is scheduled for cystoscopy next Monday (awaiting records). Wanted to discuss how I felt about this work up and plan.  having significant back pain with associated radiculopathy.  was referred to neurologist by her orthopedist.  awaiting appt. no urinary incontinence or saddle anesthesia.  Current Medications (verified): 1)  Hydrochlorothiazide 25 Mg Tabs (Hydrochlorothiazide) .... Take One Tablet By Mouth Daily 2)  Valium 5 Mg Tabs (Diazepam) .... Take One Tablet By Mouth As Needed For Vertigo 3)  Tylenol Extra Strength 1000 Mg/73ml Liqd (Acetaminophen) .... Take One Tablet By Mouth As Needed 4)  Tramadol Hcl 50 Mg Tabs (Tramadol Hcl) .... As Needed For Pain (Knee) 5)  Meclizine Hcl 25 Mg Tabs (Meclizine Hcl) .Marland Kitchen.. 1 By Mouth Two Times A Day As Needed Vertigo 6)  Lunesta 3 Mg Tabs (Eszopiclone) .... One By Mouth At Bedtime As Needed 7)  Hydrocodone-Acetaminophen 7.5-500 Mg Tabs (Hydrocodone-Acetaminophen) .... Take One Tablet By Mouth Every 4-6 Hours As Needed Pain  Allergies: 1)  ! Sulfa 2)  ! Codeine  Past History:  Past Medical History: Last updated: 2009-08-10 Hyperlipidemia Hypertension Vertigo Right knee meniscal tear  Past Surgical History: Last updated: 2009-08-10 Denies surgical history  Family History: Last updated: August 10, 2009 Mom died at 59 of PE Dad died at 65, MI, COPD  Social  History: Last updated: 2009-08-10 Lives with husband in [place]. Quit smoking when she was 70 yo. Married Alcohol use-no Drug use-no  Review of Systems      See HPI General:  Denies fever. GU:  Complains of dysuria, nocturia, and urinary frequency; denies incontinence.  Physical Exam  General:  Well-developed,well-nourished,in no acute distress; alert,appropriate and cooperative throughout examination normotensive today Psych:  Cognition and judgment appear intact. Alert and cooperative with normal attention span and concentration. No apparent delusions, illusions, hallucinations   Impression & Recommendations:  Problem # 1:  DYSURIA (ICD-788.1) Assessment Deteriorated Time spent with patient 25 minutes, more than 50% of this time was spent counseling patient on dysuria work up. UA clear here as well. agree with full work up/cystoscopy with Dr. Isabel Caprice.   answered patient's questions around the work up but deferred specific questions about procedure to Dr. Isabel Caprice since he will be performing it (awaiting records from Dr. Ellin Goodie office) Orders: UA Dipstick w/o Micro (manual) ([address])  Complete Medication List: 1)  Hydrochlorothiazide 25 Mg Tabs (Hydrochlorothiazide) .... Take one tablet by mouth daily 2)  Valium 5 Mg Tabs (Diazepam) .... Take one tablet by mouth as needed for vertigo 3)  Tylenol Extra Strength 1000 Mg/40ml Liqd (Acetaminophen) .... Take one tablet by mouth as needed 4)  Tramadol Hcl 50 Mg Tabs (Tramadol hcl) .... As needed for pain (knee) 5)  Meclizine Hcl 25 Mg Tabs (Meclizine hcl) .Marland KitchenMarland KitchenMarland Kitchen  1 by mouth two times a day as needed vertigo 6)  Lunesta 3 Mg Tabs (Eszopiclone) .... One by mouth at bedtime as needed 7)  Hydrocodone-acetaminophen 7.5-500 Mg Tabs (Hydrocodone-acetaminophen) .... Take one tablet by mouth every 4-6 hours as needed pain   Orders Added: 1)  UA Dipstick w/o Micro (manual) [81002] 2)  Est. Patient Level III [[address]]    Current Allergies  (reviewed today): ! SULFA ! CODEINE  Laboratory Results   Urine Tests  Date/Time Received: September 17, 2010 9:18 AM   Routine Urinalysis   Color: yellow Appearance: Clear Glucose: negative   (Normal Range: Negative) Bilirubin: negative   (Normal Range: Negative) Ketone: negative   (Normal Range: Negative) Spec. Gravity: 1.020   (Normal Range: 1.003-1.035) Blood: negative   (Normal Range: Negative) pH: 5.5   (Normal Range: 5.0-8.0) Protein: negative   (Normal Range: Negative) Urobilinogen: 0.2   (Normal Range: 0-1) Nitrite: negative   (Normal Range: Negative) Leukocyte Esterace: negative   (Normal Range: Negative)

## 2010-10-01 ENCOUNTER — Ambulatory Visit (INDEPENDENT_AMBULATORY_CARE_PROVIDER_SITE_OTHER): Payer: Medicare Other | Admitting: Family Medicine

## 2010-10-01 ENCOUNTER — Encounter: Payer: Self-pay | Admitting: Family Medicine

## 2010-10-01 ENCOUNTER — Telehealth (INDEPENDENT_AMBULATORY_CARE_PROVIDER_SITE_OTHER): Payer: Self-pay | Admitting: *Deleted

## 2010-10-01 DIAGNOSIS — R300 Dysuria: Secondary | ICD-10-CM

## 2010-10-01 NOTE — Letter (Signed)
Summary: MRI Abdomen  MRI Abdomen   Imported By: Kassie Mends 09/22/2010 09:38:53  _____________________________________________________________________  External Attachment:    Type:   Image     Comment:   External Document

## 2010-10-02 NOTE — Op Note (Signed)
  [name], Heather Shaw                 ACCOUNT NO.:  [id number]  MEDICAL RECORD NO.:  [id number]           PATIENT TYPE:  LOCATION:                                 FACILITY:  PHYSICIAN:  Valetta Fuller, M.D.  DATE OF BIRTH:  Feb 11, 1941  DATE OF PROCEDURE:  09/21/2010 DATE OF DISCHARGE:                              OPERATIVE REPORT   PREOPERATIVE DIAGNOSES: 1. Pelvic pain. 2. Probable interstitial cystitis.  POSTOPERATIVE DIAGNOSES: 1. Pelvic pain. 2. Probable interstitial cystitis.  PROCEDURE PERFORMED:  Cystoscopy, hydraulic overdistention of the bladder, installation of Clorpactin and Marcaine.  SURGEON:  Valetta Fuller, MD  ANESTHESIA:  General.  INDICATIONS:  Heather Shaw has had some fairly chronic discomfort with pelvic pain.  She has had a GYN evaluation and no definitive explanation for pain has been elucidated.  She does have increased urinary frequency and active voiding does appear to change her pain.  We suspectedpotentially interstitial cystitis/painful bladder syndrome and she presents now for additional diagnostic as well as potential therapeutic benefit.  Full informed consent was obtained.  The patient has received perioperative antibiotics.  TECHNIQUE AND FINDINGS:  The patient was brought to the operating room where she had successful induction of general anesthesia.  She was placed in lithotomy position and prepped and draped in the usual manner. An appropriate surgical time-out was performed.  Initial cystoscopy revealed an unremarkable urethra and bladder.  The bladder was filled to 80 cm of water pressure for 5 minutes.  Capacity was measured to be 1100 mL.  The patient did have evidence of diffuse glomerular hemorrhaging consistent with interstitial cystitis.  She underwent an additional hydraulic overdistention.  We then placed Clorpactin in her bladder under gravity drainage with standard concentration.  Her bladder was then emptied and Marcaine  instilled in her bladder.  She was brought to the recovery room having had no obvious complications or problems.     Valetta Fuller, M.D.     DSG/MEDQ  D:  09/22/2010  T:  09/22/2010  Job:  [id number]  cc:   Freddy Finner, M.D. Fax: [phone]  Electronically Signed by Barron Alvine M.D. on 10/02/2010 01:46:59 PM

## 2010-10-06 ENCOUNTER — Other Ambulatory Visit: Payer: Medicare Other

## 2010-10-06 NOTE — Progress Notes (Signed)
----   Converted from flag ---- ---- 10/01/2010 8:28 AM, Ruthe Mannan MD wrote: BMET (401.9) , lipid panel ,  hepatic panel (272.4)  ---- 10/01/2010 8:04 AM, Liane Comber CMA (AAMA) wrote: Lab orders please! Good Morning! This pt is scheduled for cpx labs Tuesday, which labs to draw and dx codes to use? Thanks Tasha ------------------------------

## 2010-10-06 NOTE — Assessment & Plan Note (Signed)
Summary: CONSULT-REFERRAL TO NEUROLOGIST/CLE  MEDICARE/[facility] OF [place]   Vital Signs:  Patient profile:   70 year old female Height:      65.5 inches Weight:      209.25 pounds BMI:     34.42 Temp:     97.8 degrees F oral Pulse rate:   61 / minute Pulse rhythm:   regular BP sitting:   130 / 70  (left arm) Cuff size:   large  Vitals Entered By: Linde Gillis CMA Duncan Dull) (October 01, 2010 11:04 AM) CC: follow up   History of Present Illness: 70 yo here to follow up urology appt.  Had cystoscopy done last Monday. Pt's husband was told that there was some irritation of the bladder but was otherwise unremarkable.  She has follow up appointment to discuss with him next week.  Still having significant back pain with associated radiculopathy.  was referred by Dr. Sedonia Small, her orthopedist, to Dr. Venetia Maxon (neuro).  Awaiting appointment. no urinary incontinence or saddle anesthesia.  She has not yet had her colonscopy.  Wants my opinion if she should go through with it. Has been having some pressure with BMs and she thought maybe that was related to her back issues.  Current Medications (verified): 1)  Hydrochlorothiazide 25 Mg Tabs (Hydrochlorothiazide) .... Take One Tablet By Mouth Daily 2)  Valium 5 Mg Tabs (Diazepam) .... Take One Tablet By Mouth As Needed For Vertigo 3)  Tylenol Extra Strength 1000 Mg/18ml Liqd (Acetaminophen) .... Take One Tablet By Mouth As Needed 4)  Meclizine Hcl 25 Mg Tabs (Meclizine Hcl) .Marland Kitchen.. 1 By Mouth Two Times A Day As Needed Vertigo 5)  Hydrocodone-Acetaminophen 7.5-500 Mg Tabs (Hydrocodone-Acetaminophen) .... Take One Tablet By Mouth Every 4-6 Hours As Needed Pain  Allergies: 1)  ! Sulfa 2)  ! Codeine  Past History:  Past Medical History: Last updated: 07/21/2009 Hyperlipidemia Hypertension Vertigo Right knee meniscal tear  Past Surgical History: Last updated: 21-Jul-2009 Denies surgical history  Family History: Last updated: 2009/07/21 Mom  died at 67 of PE Dad died at 73, MI, COPD  Social History: Last updated: 07-21-09 Lives with husband in [place]. Quit smoking when she was 70 yo. Married Alcohol use-no Drug use-no  Review of Systems      See HPI GI:  Denies constipation, diarrhea, nausea, and vomiting. GU:  Denies incontinence.  Physical Exam  General:  Well-developed,well-nourished,in no acute distress; alert,appropriate and cooperative throughout examination  Psych:  Cognition and judgment appear intact. Alert and cooperative with normal attention span and concentration. No apparent delusions, illusions, hallucinations   Impression & Recommendations:  Problem # 1:  DYSURIA (ICD-788.1) Assessment Unchanged Time spent with patient 15 minutes, more than 50% of this time was spent counseling patient on her symptoms and management.  She is in great hands with Dr. Isabel Caprice, I will await his cystoscopy report.  He also placed her on Uribel which has helped a little with her symptoms.  Problem # 2:  SPECIAL SCREENING FOR MALIGNANT NEOPLASMS COLON (ICD-V76.51) Order placed for GI referral. Orders: Gastroenterology Referral (GI)  Complete Medication List: 1)  Hydrochlorothiazide 25 Mg Tabs (Hydrochlorothiazide) .... Take one tablet by mouth daily 2)  Valium 5 Mg Tabs (Diazepam) .... Take one tablet by mouth as needed for vertigo 3)  Tylenol Extra Strength 1000 Mg/54ml Liqd (Acetaminophen) .... Take one tablet by mouth as needed 4)  Meclizine Hcl 25 Mg Tabs (Meclizine hcl) .Marland Kitchen.. 1 by mouth two times a day as needed vertigo 5)  Hydrocodone-acetaminophen 7.5-500 Mg Tabs (Hydrocodone-acetaminophen) .... Take one tablet by mouth every 4-6 hours as needed pain  Patient Instructions: 1)  Please stop by to see Shirlee Limerick on your way out.   Orders Added: 1)  Gastroenterology Referral [GI] 2)  Est. Patient Level III [[address]]    Current Allergies (reviewed today): ! SULFA ! CODEINE

## 2010-10-07 ENCOUNTER — Other Ambulatory Visit: Payer: Self-pay | Admitting: Family Medicine

## 2010-10-07 ENCOUNTER — Other Ambulatory Visit (INDEPENDENT_AMBULATORY_CARE_PROVIDER_SITE_OTHER): Payer: Medicare Other

## 2010-10-07 ENCOUNTER — Encounter: Payer: Self-pay | Admitting: *Deleted

## 2010-10-07 DIAGNOSIS — E785 Hyperlipidemia, unspecified: Secondary | ICD-10-CM

## 2010-10-07 LAB — BASIC METABOLIC PANEL
CO2: 29 mEq/L (ref 19–32)
Calcium: 9.6 mg/dL (ref 8.4–10.5)
Calcium: 9.3 mg/dL (ref 8.4–10.5)
Chloride: 100 mEq/L (ref 96–112)
Creatinine, Ser: 0.72 mg/dL (ref 0.4–1.2)
GFR calc Af Amer: >60 mL/min (ref >60)
Sodium: 138 mEq/L (ref 135–145)

## 2010-10-07 LAB — LIPID PANEL
HDL: 38.9 mg/dL — ABNORMAL LOW (ref >39.00)
Triglycerides: 240 mg/dL — ABNORMAL HIGH (ref 0.0–149.0)

## 2010-10-07 LAB — DIFFERENTIAL
Eosinophils Absolute: 0 10*3/uL (ref 0.0–0.7)
Lymphocytes Relative: 14 % (ref 12–46)
Lymphs Abs: 1 10*3/uL (ref 0.7–4.0)
Neutrophils Relative %: 85 % — ABNORMAL HIGH (ref 43–77)

## 2010-10-07 LAB — HEPATIC FUNCTION PANEL
ALT: 16 U/L (ref 0–35)
AST: 18 U/L (ref 0–37)
Total Protein: 6.6 g/dL (ref 6.0–8.3)

## 2010-10-07 LAB — LDL CHOLESTEROL, DIRECT: Direct LDL: 145.9 mg/dL

## 2010-10-07 LAB — CBC
Platelets: 220 10*3/uL (ref 150–400)
RBC: 4.98 MIL/uL (ref 3.87–5.11)
WBC: 7.5 10*3/uL (ref 4.0–10.5)

## 2010-10-07 LAB — RAPID STREP SCREEN (MED CTR MEBANE ONLY): Streptococcus, Group A Screen (Direct): NEGATIVE

## 2010-10-13 ENCOUNTER — Encounter: Payer: Medicare Other | Admitting: Family Medicine

## 2010-10-13 LAB — POCT I-STAT, CHEM 8
Calcium, Ion: 1.21 mmol/L (ref 1.12–1.32)
Hemoglobin: 14.6 g/dL (ref 12.0–15.0)
Sodium: 142 mEq/L (ref 135–145)
TCO2: 29 mmol/L (ref 0–100)

## 2010-10-27 ENCOUNTER — Encounter: Payer: Self-pay | Admitting: Family Medicine

## 2010-10-27 ENCOUNTER — Ambulatory Visit (INDEPENDENT_AMBULATORY_CARE_PROVIDER_SITE_OTHER): Payer: Medicare Other | Admitting: Family Medicine

## 2010-10-27 DIAGNOSIS — Z0000 Encounter for general adult medical examination without abnormal findings: Secondary | ICD-10-CM

## 2010-10-27 DIAGNOSIS — I10 Essential (primary) hypertension: Secondary | ICD-10-CM

## 2010-10-27 MED ORDER — TRAMADOL HCL 50 MG PO TABS: 50.0000 mg | ORAL_TABLET | Freq: Four times a day (QID) | ORAL | Status: DC | PRN

## 2010-10-27 MED ORDER — ZOSTER VACCINE LIVE 19400 UNT/0.65ML ~~LOC~~ SOLR
0.6500 mL | Freq: Once | SUBCUTANEOUS | Status: AC
  Administered 2010-10-27: 19400 [IU] via SUBCUTANEOUS

## 2010-10-27 NOTE — Patient Instructions (Signed)
Triglycerides are too high.  Weight loss (even small amount) can decrease triglycerides.  Decrease added sugars, eliminate trans fats, increase fiber and limit alcohol.  All these changes together can drop triglycerides by almost 50%.

## 2010-10-27 NOTE — Progress Notes (Signed)
70 yo here to for [facility] Annual Wellness Visit.  I have personally reviewed the Medicare Annual Wellness questionnaire and have noted 1. The patient's medical and social history- HTN- on HCTZ 25 mg.  No CP, SOB, HA or blurred vision.  Hyperlipidemia- was on Lipitor. Gave her myalgias and nausea.   Not taking anything right now.   Lipids still elevated- LDL last month 145, HDL 38 and TG240. Working on diet now for her IC.  IC- followed by Dr. Isabel Caprice.  Symptoms are slowly improving.  Well woman- UTD on mammogram, bone scan, pap smear (sees Dr. Jennette Kettle).   Colonoscopy scheduled for Monday. Due for Zostavax.  2. Their use of alcohol, tobacco or illicit drugs 3. Their current medications and supplements 4. The patient's functional ability including ADL's, fall risks, home safety risks and hearing or visual             impairment. 5. Diet and physical activities- trying to walk more.  Working on eliminating sugar from diet.   6. Evidence for depression or mood disorders- none  The PMH, PSH, Social History, Family History, Medications, and allergies have been reviewed in [facility], and have been updated if relevant.   Review of Systems       See HPI General:  Denies chills, fever, and malaise. CV:  Denies chest pain or discomfort. Resp:  Denies shortness of breath. GI:  Denies abdominal pain. MS:  Denies muscle weakness. Derm:  Denies rash. Neuro:  Denies sensation of room spinning and visual disturbances. Psych:  Denies anxiety and depression.  Physical Exam BP 130/80  Pulse 61  Temp(Src) 97.5 F (36.4 C) (Oral)  Ht 5' 5.5" (1.664 m)  Wt 210 lb (95.255 kg)  BMI 34.41 kg/m2  General:  Well-developed,well-nourished,in no acute distress; alert,appropriate and cooperative throughout examination Ears:  R ear normal and L ear normal.   Mouth:  Oral mucosa and oropharynx without lesions or exudates.  Teeth in good repair. Lungs:  Normal respiratory effort, chest expands symmetrically.  Lungs are clear to auscultation, no crackles or wheezes. Heart:  Normal rate and regular rhythm. S1 and S2 normal without gallop, murmur, click, rub or other extra sounds. Abdomen:  Bowel sounds positive,abdomen soft and non-tender without masses, organomegaly or hernias noted. Extremities:  No clubbing, cyanosis, edema, or deformity noted with normal full range of motion of all joints.   Skin:  Intact without suspicious lesions or rashes Psych:  Cognition and judgment appear intact. Alert and cooperative with normal attention span and concentration. No apparent delusions, illusions, hallucinations

## 2010-10-27 NOTE — Progress Notes (Signed)
Addended by: Linde Gillis on: 10/27/2010 10:03 AM   Modules accepted: Orders

## 2010-11-02 HISTORY — PX: COLONOSCOPY: SHX174

## 2010-11-26 ENCOUNTER — Other Ambulatory Visit: Payer: Self-pay | Admitting: Family Medicine

## 2010-12-07 ENCOUNTER — Other Ambulatory Visit: Payer: Self-pay | Admitting: *Deleted

## 2010-12-07 MED ORDER — HYDROCHLOROTHIAZIDE 25 MG PO TABS: 25.0000 mg | ORAL_TABLET | Freq: Every day | ORAL | Status: DC

## 2010-12-18 ENCOUNTER — Emergency Department: Payer: Self-pay | Admitting: *Deleted

## 2010-12-22 ENCOUNTER — Encounter: Payer: Self-pay | Admitting: Family Medicine

## 2010-12-22 ENCOUNTER — Ambulatory Visit (INDEPENDENT_AMBULATORY_CARE_PROVIDER_SITE_OTHER): Payer: Medicare Other | Admitting: Family Medicine

## 2010-12-22 VITALS — BP 146/82 | HR 60 | Temp 98.3°F | Ht 65.5 in | Wt 206.4 lb

## 2010-12-22 DIAGNOSIS — I10 Essential (primary) hypertension: Secondary | ICD-10-CM

## 2010-12-22 MED ORDER — ESZOPICLONE 3 MG PO TABS: 3.0000 mg | ORAL_TABLET | Freq: Every evening | ORAL | Status: DC | PRN

## 2010-12-22 MED ORDER — ESZOPICLONE 2 MG PO TABS: 2.0000 mg | ORAL_TABLET | Freq: Every evening | ORAL | Status: DC | PRN

## 2010-12-22 MED ORDER — LISINOPRIL 20 MG PO TABS: 20.0000 mg | ORAL_TABLET | Freq: Every day | ORAL | Status: DC

## 2010-12-22 NOTE — Patient Instructions (Signed)
Good to see you. Take your lisinopril as soon as you pick it up today. Tonight, check your blood pressure before bedtime. If it's high, take another clonidine. Tomorrow, just take your HCTZ and lisinopril in the morning.  Recheck your blood pressure in the evening. If high, ok to take another clonidine. Repeat this over the next few days and call me on Friday with an update.

## 2010-12-22 NOTE — Assessment & Plan Note (Signed)
Deteriorated, likely due to steroid injection. Per pt, labs were check in ER (awaiting records). Will add lisinopril 20 mg to her HCTZ 25 mg and use clonidine on as needed basis only. See pt instructions for details.

## 2010-12-22 NOTE — Progress Notes (Signed)
70 yo here for elevated BP.    Felt funny on Thursday night,  checked her BP at home and it was 200s/100s.  Went to [facility].  Had a bad headache and felt a little disoriented.  No CP, SOB, nausea or vomiting.  No focal neurological deficits. Has been on HCTZ 25 mg for some time.   We do not yet have ER notes to review.  Had similar episode when she had steroid injections in her knees in October 2011.   Had steroid injection in spine recently.  ER added clonidine to her HCTZ 25 mg daily.  HA has now resolved. Checking her BP every day, this morning was 117/74.  The PMH, PSH, Social History, Family History, Medications, and allergies have been reviewed in [facility], and have been updated if relevant.   Review of Systems       See HPI General:  Denies malaise. Eyes:  Denies blurring. CV:  Denies chest pain or discomfort. Resp:  Denies pleuritic. Neuro:  Denies headaches, inability to speak, numbness, poor balance, seizures, sensation of room spinning, tremors, visual disturbances, and weakness.  Physical Exam BP 146/82  Pulse 60  Temp(Src) 98.3 F (36.8 C) (Oral)  Ht 5' 5.5" (1.664 m)  Wt 206 lb 6.4 oz (93.622 kg)  BMI 33.82 kg/m2  General:  Well-developed,well-nourished,in no acute distress; alert,appropriate and cooperative throughout examination slightly hypertensive Lungs:  Normal respiratory effort, chest expands symmetrically. Lungs are clear to auscultation, no crackles or wheezes. Heart:  Normal rate and regular rhythm. S1 and S2 normal without gallop, murmur, click, rub or other extra sounds. Extremities:  No clubbing, cyanosis, edema, or deformity noted with normal full range of motion of all joints.   Neurologic:  No cranial nerve deficits noted. Station and gait are normal. Plantar reflexes are down-going bilaterally. DTRs are symmetrical throughout. Sensory, motor and coordinative functions appear intact. Psych:  Cognition and judgment appear intact. Alert and cooperative  with normal attention span and concentration. No apparent delusions, illusions, hallucinations

## 2010-12-29 ENCOUNTER — Encounter: Payer: Self-pay | Admitting: Family Medicine

## 2010-12-29 ENCOUNTER — Ambulatory Visit (INDEPENDENT_AMBULATORY_CARE_PROVIDER_SITE_OTHER): Payer: Medicare Other | Admitting: Family Medicine

## 2010-12-29 VITALS — BP 152/80 | HR 75 | Temp 97.8°F | Ht 65.5 in | Wt 207.8 lb

## 2010-12-29 DIAGNOSIS — I10 Essential (primary) hypertension: Secondary | ICD-10-CM

## 2010-12-29 NOTE — Progress Notes (Signed)
70 yo here for follow up elevated BP.   Went to [facility] last week for elevated BP, had a bad headache and felt a little disoriented.  No CP, SOB, nausea or vomiting.  No focal neurological deficits. Has been on HCTZ 25 mg for some time.   Had similar episode when she had steroid injections in her knees in October 2011.   Had steroid injection in spine recently.  ER added clonidine to her HCTZ 25 mg daily.  HA has now resolved.  When I saw her last week, we added Lisinopril 20 mg daily  to her HCTZ and advised her to use clonidine only on prn basis.   No angioedema or cough.   Checking her BP every day, this morning was 117/74, but has been as high as 180/100. She feels better, thinks her home cuff is inaccurate.    The PMH, PSH, Social History, Family History, Medications, and allergies have been reviewed in [facility], and have been updated if relevant.   Review of Systems       See HPI General:  Denies malaise. Eyes:  Denies blurring. CV:  Denies chest pain or discomfort. Resp:  Denies pleuritic. Neuro:  Denies headaches, inability to speak, numbness, poor balance, seizures, sensation of room spinning, tremors, visual disturbances, and weakness.  Physical Exam BP 152/80  Pulse 75  Temp(Src) 97.8 F (36.6 C) (Oral)  Ht 5' 5.5" (1.664 m)  Wt 207 lb 12.8 oz (94.257 kg)  BMI 34.05 kg/m2 General:  Well-developed,well-nourished,in no acute distress; alert,appropriate and cooperative throughout examination slightly hypertensive Lungs:  Normal respiratory effort, chest expands symmetrically. Lungs are clear to auscultation, no crackles or wheezes. Heart:  Normal rate and regular rhythm. S1 and S2 normal without gallop, murmur, click, rub or other extra sounds. Extremities:  No clubbing, cyanosis, edema, or deformity noted with normal full range of motion of all joints.   Neurologic:  No cranial nerve deficits noted. Station and gait are normal. Plantar reflexes are down-going bilaterally.  DTRs are symmetrical throughout. Sensory, motor and coordinative functions appear intact. Psych:  Cognition and judgment appear intact. Alert and cooperative with normal attention span and concentration. No apparent delusions, illusions, hallucinations  A/P- HTN- Improved. Advised getting an OMRON cuff. Continue current dose of Lisinopril and HCTZ, with only PRN clonidine. Follow up nurse visit for BP check in 2 weeks.

## 2010-12-30 ENCOUNTER — Telehealth: Payer: Self-pay | Admitting: *Deleted

## 2010-12-30 MED ORDER — DIAZEPAM 5 MG PO TABS: 5.0000 mg | ORAL_TABLET | Freq: Four times a day (QID) | ORAL | Status: DC | PRN

## 2010-12-30 NOTE — Telephone Encounter (Signed)
I'm happy with those BP readings. I will enter rx for valium.  Please call in.

## 2010-12-30 NOTE — Telephone Encounter (Signed)
Patient advised as instructed via telephone.  Rx call to pharmacy.

## 2010-12-30 NOTE — Telephone Encounter (Signed)
Pt called to report BP readings.  Last night it was 157/59, pulse 63, 142/80 after meds this morning with a pulse of 71.  She also states she wants to stop tramadol and vicodin and is asking if she can have a script for valium for a few days to get her through.  Wants 5 mg's.  [facility] [place]

## 2011-01-28 ENCOUNTER — Other Ambulatory Visit: Payer: Self-pay | Admitting: *Deleted

## 2011-01-28 MED ORDER — DIAZEPAM 5 MG PO TABS: 5.0000 mg | ORAL_TABLET | Freq: Four times a day (QID) | ORAL | Status: DC | PRN

## 2011-01-29 ENCOUNTER — Other Ambulatory Visit: Payer: Self-pay | Admitting: *Deleted

## 2011-01-29 MED ORDER — DIAZEPAM 5 MG PO TABS: 5.0000 mg | ORAL_TABLET | Freq: Four times a day (QID) | ORAL | Status: DC | PRN

## 2011-01-29 NOTE — Telephone Encounter (Signed)
[facility] 

## 2011-02-04 ENCOUNTER — Telehealth: Payer: Self-pay | Admitting: *Deleted

## 2011-02-04 NOTE — Telephone Encounter (Signed)
Advised pharmacist as instructed via telephone.

## 2011-02-04 NOTE — Telephone Encounter (Signed)
Received a call from pharmacy stating that Diazepam Rx was just refilled six days ago on 01/29/2011.  They wanted to make Dr. Dayton Martes aware of this.  Is it ok to refill again this soon?  Please advise.

## 2011-02-04 NOTE — Telephone Encounter (Signed)
Sorry did not realize that. NOT ok to refill.

## 2011-02-04 NOTE — Telephone Encounter (Signed)
[facility] 

## 2011-02-05 ENCOUNTER — Other Ambulatory Visit: Payer: Self-pay | Admitting: Family Medicine

## 2011-03-01 ENCOUNTER — Encounter: Payer: Self-pay | Admitting: Family Medicine

## 2011-03-01 ENCOUNTER — Ambulatory Visit (INDEPENDENT_AMBULATORY_CARE_PROVIDER_SITE_OTHER): Payer: Medicare Other | Admitting: Family Medicine

## 2011-03-01 VITALS — BP 140/80 | HR 71 | Temp 98.3°F | Wt 213.8 lb

## 2011-03-01 DIAGNOSIS — I10 Essential (primary) hypertension: Secondary | ICD-10-CM

## 2011-03-01 NOTE — Progress Notes (Signed)
70 yo here for follow up HTN.  On HCTZ 25 mg, Lisinopril 20 mg daily. Takes as needed clonidine. Bp is always a little elevated in office. She now purchased an OMRON home cuff and overall, BP has been good. This morning, at home, BP was 116/80.  Last week, after doing her back stretches, BP was 180/100- took a clonidine and it came down quickly.  No HA, blurred vision, LE edema, CP or SOB.  The PMH, PSH, Social History, Family History, Medications, and allergies have been reviewed in [facility], and have been updated if relevant.  Review of Systems       See HPI General:  Denies malaise. Eyes:  Denies blurring. CV:  Denies chest pain or discomfort. Resp:  Denies pleuritic. Neuro:  Denies headaches, inability to speak, numbness, poor balance, seizures, sensation of room spinning, tremors, visual disturbances, and weakness.  Physical Exam BP 140/80  Pulse 71  Temp(Src) 98.3 F (36.8 C) (Oral)  Wt 213 lb 12 oz (96.956 kg) General:  Well-developed,well-nourished,in no acute distress; alert,appropriate and cooperative throughout examination slightly hypertensive Lungs:  Normal respiratory effort, chest expands symmetrically. Lungs are clear to auscultation, no crackles or wheezes. Heart:  Normal rate and regular rhythm. S1 and S2 normal without gallop, murmur, click, rub or other extra sounds. Extremities:  No clubbing, cyanosis, edema, or deformity noted with normal full range of motion of all joints.   Neurologic:  No cranial nerve deficits noted. Station and gait are normal. Plantar reflexes are down-going bilaterally. DTRs are symmetrical throughout. Sensory, motor and coordinative functions appear intact. Psych:  Cognition and judgment appear intact. Alert and cooperative with normal attention span and concentration. No apparent delusions, illusions, hallucinations  A/P- HTN- Improved. Continue current dose of Lisinopril and HCTZ, with only PRN clonidine. Advised following up with  neurosurgery as she is due for more injections in her spine (caused her BP to increase last time). We need to keep a close eye on her BP if she does receive more injections. The patient indicates understanding of these issues and agrees with the plan.

## 2011-04-02 ENCOUNTER — Other Ambulatory Visit: Payer: Self-pay | Admitting: Family Medicine

## 2011-04-07 ENCOUNTER — Encounter: Payer: Self-pay | Admitting: Family Medicine

## 2011-04-07 ENCOUNTER — Ambulatory Visit (INDEPENDENT_AMBULATORY_CARE_PROVIDER_SITE_OTHER): Payer: Medicare Other | Admitting: Family Medicine

## 2011-04-07 VITALS — BP 150/92 | HR 72 | Temp 98.2°F | Wt 210.5 lb

## 2011-04-07 DIAGNOSIS — M51379 Other intervertebral disc degeneration, lumbosacral region without mention of lumbar back pain or lower extremity pain: Secondary | ICD-10-CM | POA: Insufficient documentation

## 2011-04-07 DIAGNOSIS — M5416 Radiculopathy, lumbar region: Secondary | ICD-10-CM | POA: Insufficient documentation

## 2011-04-07 DIAGNOSIS — IMO0002 Reserved for concepts with insufficient information to code with codable children: Secondary | ICD-10-CM

## 2011-04-07 DIAGNOSIS — M5137 Other intervertebral disc degeneration, lumbosacral region: Secondary | ICD-10-CM | POA: Insufficient documentation

## 2011-04-07 DIAGNOSIS — I10 Essential (primary) hypertension: Secondary | ICD-10-CM

## 2011-04-07 MED ORDER — GABAPENTIN 100 MG PO CAPS: ORAL_CAPSULE | ORAL | Status: DC

## 2011-04-07 NOTE — Patient Instructions (Signed)
Good to see you. Let's keep your blood pressure medication the same. See Shirlee Limerick on your way out to set up neurology referral.

## 2011-04-07 NOTE — Progress Notes (Signed)
70 yo here for follow up HTN.  On HCTZ 25 mg, Lisinopril 20 mg daily. Takes as needed clonidine. Bp is always a little elevated in office. Brings home readings with her- checks twice daily.  Most readings are great- averaging 106/72-130/80. Only requires clonidine once every few weeks when her back flares up.  No HA, blurred vision, LE edema, CP or SOB.  L4 radiculopathy- Saw Dr. Venetia Maxon, neurosurgery in 10/2010. MRI showed L> R disc herniation at L4-L5 with spondylosis and right L4 nerve compression. Tried nerve root block which did not provide much relief. She wants referral to neurologist, Dr. Terrace Arabia.  The PMH, PSH, Social History, Family History, Medications, and allergies have been reviewed in [facility], and have been updated if relevant.  Review of Systems       See HPI General:  Denies malaise. Eyes:  Denies blurring. CV:  Denies chest pain or discomfort. Resp:  Denies pleuritic. Neuro:  Denies headaches, inability to speak, numbness, poor balance, seizures, sensation of room spinning, tremors, visual disturbances, and weakness.  Physical Exam BP 150/92  Pulse 72  Temp(Src) 98.2 F (36.8 C) (Oral)  Wt 210 lb 8 oz (95.482 kg) BP Readings from Last 3 Encounters:  04/07/11 150/92  03/01/11 140/80  12/29/10 152/80    General:  Well-developed,well-nourished,in no acute distress; alert,appropriate and cooperative throughout examination slightly hypertensive Lungs:  Normal respiratory effort, chest expands symmetrically. Lungs are clear to auscultation, no crackles or wheezes. Heart:  Normal rate and regular rhythm. S1 and S2 normal without gallop, murmur, click, rub or other extra sounds. Extremities:  No clubbing, cyanosis, edema, or deformity noted with normal full range of motion of all joints.   Neurologic:  No cranial nerve deficits noted. Station and gait are normal. Plantar reflexes are down-going bilaterally. DTRs are symmetrical throughout. Sensory, motor and coordinative  functions appear intact. Psych:  Cognition and judgment appear intact. Alert and cooperative with normal attention span and concentration. No apparent delusions, illusions, hallucinations  A/P-   1. HYPERTENSION  Stable. Continue current dose of Lisinopril and HCTZ, with only PRN clonidine.    2. Radiculopathy, lumbar region  Deteriorated. Will add Gabapentin and refer to neuro.  Ambulatory referral to Neurology

## 2011-04-29 ENCOUNTER — Other Ambulatory Visit: Payer: Self-pay | Admitting: *Deleted

## 2011-04-29 MED ORDER — HYDROCODONE-ACETAMINOPHEN 7.5-500 MG PO TABS: ORAL_TABLET | ORAL | Status: DC

## 2011-04-29 NOTE — Telephone Encounter (Signed)
[facility], patient notified via telephone.

## 2011-04-29 NOTE — Telephone Encounter (Signed)
Patient is requesting a refill until her Neurology appt next Wednesday.  She stated that the Tramadol isn't helping.  Please advise.

## 2011-05-20 ENCOUNTER — Encounter: Payer: Self-pay | Admitting: Family Medicine

## 2011-05-20 ENCOUNTER — Ambulatory Visit (INDEPENDENT_AMBULATORY_CARE_PROVIDER_SITE_OTHER): Payer: Medicare Other | Admitting: Family Medicine

## 2011-05-20 VITALS — BP 124/72 | HR 77 | Temp 98.7°F | Ht 65.5 in | Wt 209.8 lb

## 2011-05-20 DIAGNOSIS — I10 Essential (primary) hypertension: Secondary | ICD-10-CM

## 2011-05-20 NOTE — Progress Notes (Signed)
70 yo here for follow up HTN.  On HCTZ 25 mg, Lisinopril 20 mg daily. Takes as needed clonidine.  Brings home readings with her- checks twice daily.  Most readings are great- averaging 106/72-127/90 but does have some elevated readings in 160s-180/100s.  Seem to occur more frequently in evenings but is happening in morning as well.  Only requires clonidine once every few weeks when her back flares up.  No HA, blurred vision, LE edema, CP or SOB.  Did have some left arm pain at one point when her BP was elevated.  EKG NSR in 11/2010. Cr 0.6 in 09/2010.  The PMH, PSH, Social History, Family History, Medications, and allergies have been reviewed in [facility], and have been updated if relevant.  Review of Systems       See HPI  Physical Exam BP 124/72  Pulse 77  Temp(Src) 98.7 F (37.1 C) (Oral)  Ht 5' 5.5" (1.664 m)  Wt 209 lb 12 oz (95.142 kg)  BMI 34.37 kg/m2 BP Readings from Last 3 Encounters:  05/20/11 124/72  04/07/11 150/92  03/01/11 140/80    General:  Well-developed,well-nourished,in no acute distress; alert,appropriate and cooperative throughout examination slightly hypertensive Lungs:  Normal respiratory effort, chest expands symmetrically. Lungs are clear to auscultation, no crackles or wheezes. Heart:  Normal rate and regular rhythm. S1 and S2 normal without gallop, murmur, click, rub or other extra sounds. Extremities:  No clubbing, cyanosis, edema, or deformity noted with normal full range of motion of all joints.   Neurologic:  No cranial nerve deficits noted. Station and gait are normal. Plantar reflexes are down-going bilaterally. DTRs are symmetrical throughout. Sensory, motor and coordinative functions appear intact. Psych:  Cognition and judgment appear intact. Alert and cooperative with normal attention span and concentration. No apparent delusions, illusions, hallucinations  A/P-   1. HYPERTENSION  Stable here but continuing to have elevated readings at  home. Has compared her cuff to ours in office, usually pretty close to same reading. Continue current dose of Lisinopril and HCTZ, with only PRN clonidine. Will refer to cardiology for further work up and management.

## 2011-05-20 NOTE — Patient Instructions (Signed)
Please stop by to see Heather Shaw on your way out to set up your cardiology appointment.

## 2011-05-27 ENCOUNTER — Other Ambulatory Visit: Payer: Self-pay | Admitting: Family Medicine

## 2011-05-27 NOTE — Telephone Encounter (Signed)
Rx refilled electronically by Dr. Aron. 

## 2011-06-02 ENCOUNTER — Encounter: Payer: Self-pay | Admitting: *Deleted

## 2011-06-03 ENCOUNTER — Ambulatory Visit (INDEPENDENT_AMBULATORY_CARE_PROVIDER_SITE_OTHER): Payer: Medicare Other | Admitting: Cardiovascular Disease

## 2011-06-03 ENCOUNTER — Encounter: Payer: Self-pay | Admitting: Cardiovascular Disease

## 2011-06-03 DIAGNOSIS — IMO0002 Reserved for concepts with insufficient information to code with codable children: Secondary | ICD-10-CM

## 2011-06-03 DIAGNOSIS — I10 Essential (primary) hypertension: Secondary | ICD-10-CM

## 2011-06-03 DIAGNOSIS — E785 Hyperlipidemia, unspecified: Secondary | ICD-10-CM

## 2011-06-03 DIAGNOSIS — M5416 Radiculopathy, lumbar region: Secondary | ICD-10-CM

## 2011-06-03 MED ORDER — NEBIVOLOL HCL 10 MG PO TABS: 10.0000 mg | ORAL_TABLET | Freq: Every day | ORAL | Status: DC

## 2011-06-03 MED ORDER — LISINOPRIL 40 MG PO TABS: 40.0000 mg | ORAL_TABLET | Freq: Every day | ORAL | Status: DC

## 2011-06-03 NOTE — Patient Instructions (Signed)
Your physician recommends that you schedule a follow-up appointment in: 6 weeks  Start BYSTOLIC 10 MG DAILY  INCREASE LISINOPRIL 40 MG DAILY

## 2011-06-03 NOTE — Assessment & Plan Note (Signed)
Try to minimize Rx with NSAI's and steroid iniections which will make BP worse

## 2011-06-03 NOTE — Progress Notes (Signed)
  70 yo referred by primary for HTN.  Recent worsening.  Seen in ER at [facility] and given PRN clonidine which probably not helping fluctuations.  She is compliant with her lisinopril and has been on diuretic for years.  No unusual stress but has a white coat component.  Has an Omeron BP cuff and most readings at home are high.  Seems real nervous.  No history of chronic kidney disease.  Some salt in diet.  No stimulants or cold meds.  Denies ETOH and quit smoking over 25 years ago.  No SSCP, palpitations or syncope  ROS: Denies fever, malais, weight loss, blurry vision, decreased visual acuity, cough, sputum, SOB, hemoptysis, pleuritic pain, palpitaitons, heartburn, abdominal pain, melena, lower extremity edema, claudication, or rash.  All other systems reviewed and negative   General: Affect appropriate Healthy:  appears stated age HEENT: normal Neck supple with no adenopathy JVP normal no bruits no thyromegaly Lungs clear with no wheezing and good diaphragmatic motion Heart:  S1/S2 no murmur,rub, gallop or click PMI normal Abdomen: benighn, BS positve, no tenderness, no AAA no bruit.  No HSM or HJR Distal pulses intact with no bruits No edema Neuro non-focal Skin warm and dry No muscular weakness  Medications Current Outpatient Prescriptions  Medication Sig Dispense Refill  . acetaminophen (TYLENOL) 500 MG tablet Take 500 mg by mouth as needed.        . cloNIDine (CATAPRES) 0.1 MG tablet Take 0.1 mg by mouth as needed.       . Eszopiclone (ESZOPICLONE) 3 MG TABS Take 3 mg by mouth at bedtime. Take immediately before bedtime       . hydrochlorothiazide 25 MG tablet Take 1 tablet (25 mg total) by mouth daily.  30 tablet  6  . HYDROcodone-acetaminophen (LORTAB) 7.5-500 MG per tablet Take one tablet by mouth every 4-6 hours as needed for pain  60 tablet  0  . lisinopril (PRINIVIL,ZESTRIL) 20 MG tablet Take 1 tablet (20 mg total) by mouth daily.  30 tablet  11  . traMADol (ULTRAM) 50 MG  tablet TAKE 1 TABLET BY MOUTH EVERY 6 HOURS AS NEEDED FOR KNEE PAIN  60 tablet  0    Allergies Codeine and Sulfonamide derivatives  Family History: Family History  Problem Relation Age of Onset  . COPD Father     Social History: History   Social History  . Marital Status: Married    Spouse Name: N/A    Number of Children: N/A  . Years of Education: N/A   Occupational History  . Not on file.   Social History Main Topics  . Smoking status: Former Games developer  . Smokeless tobacco: Not on file  . Alcohol Use: No  . Drug Use: No  . Sexually Active:    Other Topics Concern  . Not on file   Social History Narrative  . No narrative on file    Electrocardiogram:   12/18/10  NSR 65 normal ECG no LVH  Assessment and Plan

## 2011-06-03 NOTE — Assessment & Plan Note (Signed)
Stop clonidine PRN.  Increase lisinopril and take latter in day with diuretic.  Add bystolic in am.  F/U with me -6 weeks

## 2011-06-03 NOTE — Assessment & Plan Note (Signed)
Cholesterol is at goal.  Continue current dose of statin and diet Rx.  No myalgias or side effects.  F/U  LFT's in 6 months. No results found for this basename: LDLCALC  Labs with primary            

## 2011-06-18 ENCOUNTER — Other Ambulatory Visit: Payer: Self-pay | Admitting: *Deleted

## 2011-06-18 MED ORDER — HYDROCODONE-ACETAMINOPHEN 7.5-500 MG PO TABS: ORAL_TABLET | ORAL | Status: DC

## 2011-06-18 MED ORDER — ESZOPICLONE 3 MG PO TABS: 3.0000 mg | ORAL_TABLET | Freq: Every day | ORAL | Status: DC

## 2011-06-18 NOTE — Telephone Encounter (Signed)
Ok to refill lortab #60, 0 refills lunesta #30, 0 refills  Filled in Dr. Elmer Sow absence

## 2011-06-18 NOTE — Telephone Encounter (Signed)
[facility]

## 2011-06-18 NOTE — Telephone Encounter (Signed)
Faxed request from [facility], lortab last filled 04/29/11, lunesta last filled 05/18/11.

## 2011-07-13 ENCOUNTER — Encounter: Payer: Self-pay | Admitting: Cardiovascular Disease

## 2011-07-13 ENCOUNTER — Ambulatory Visit (INDEPENDENT_AMBULATORY_CARE_PROVIDER_SITE_OTHER): Payer: Medicare Other | Admitting: Cardiovascular Disease

## 2011-07-13 DIAGNOSIS — IMO0002 Reserved for concepts with insufficient information to code with codable children: Secondary | ICD-10-CM

## 2011-07-13 DIAGNOSIS — E785 Hyperlipidemia, unspecified: Secondary | ICD-10-CM

## 2011-07-13 DIAGNOSIS — I10 Essential (primary) hypertension: Secondary | ICD-10-CM

## 2011-07-13 DIAGNOSIS — M5416 Radiculopathy, lumbar region: Secondary | ICD-10-CM

## 2011-07-13 NOTE — Progress Notes (Signed)
70 yo referred by primary for HTN. Recent worsening. Seen in ER at [facility] and given PRN clonidine which probably not helping fluctuations. She is compliant with her lisinopril and has been on diuretic for years. No unusual stress but has a white coat component. Has an Omeron BP cuff and most readings at home are high. Seems real nervous. No history of chronic kidney disease. Some salt in diet. No stimulants or cold meds. Denies ETOH and quit smoking over 25 years ago. No SSCP, palpitations or syncope  Last visit added bystolic to ACE and diuretic.  Still having problems with taking afternoon meds on time. Home readings improved.  Her BP machine ran high in office today  ROS: Denies fever, malais, weight loss, blurry vision, decreased visual acuity, cough, sputum, SOB, hemoptysis, pleuritic pain, palpitaitons, heartburn, abdominal pain, melena, lower extremity edema, claudication, or rash.  All other systems reviewed and negative  General: Affect appropriate Healthy:  appears stated age HEENT: normal Neck supple with no adenopathy JVP normal no bruits no thyromegaly Lungs clear with no wheezing and good diaphragmatic motion Heart:  S1/S2 no murmur,rub, gallop or click PMI normal Abdomen: benighn, BS positve, no tenderness, no AAA no bruit.  No HSM or HJR Distal pulses intact with no bruits No edema Neuro non-focal Skin warm and dry No muscular weakness   Current Outpatient Prescriptions  Medication Sig Dispense Refill  . acetaminophen (TYLENOL) 500 MG tablet Take 500 mg by mouth as needed.        . Eszopiclone (ESZOPICLONE) 3 MG TABS Take 1 tablet (3 mg total) by mouth at bedtime. Take immediately before bedtime  30 tablet  0  . hydrochlorothiazide 25 MG tablet Take 1 tablet (25 mg total) by mouth daily.  30 tablet  6  . HYDROcodone-acetaminophen (LORTAB) 7.5-500 MG per tablet Take one tablet by mouth every 4-6 hours as needed for pain  60 tablet  0  . lisinopril (PRINIVIL,ZESTRIL) 40 MG  tablet Take 1 tablet (40 mg total) by mouth daily.  30 tablet  11  . nebivolol (BYSTOLIC) 10 MG tablet Take 1 tablet (10 mg total) by mouth daily.  30 tablet  12  . traMADol (ULTRAM) 50 MG tablet TAKE 1 TABLET BY MOUTH EVERY 6 HOURS AS NEEDED FOR KNEE PAIN  60 tablet  0    Allergies  Codeine and Sulfonamide derivatives  Electrocardiogram:  Assessment and Plan

## 2011-07-13 NOTE — Assessment & Plan Note (Signed)
F/U ortho  Avoid steroids and NSAI's as much as possible

## 2011-07-13 NOTE — Assessment & Plan Note (Signed)
Cholesterol is at goal.  Continue current dose of statin and diet Rx.  No myalgias or side effects.  F/U  LFT's in 6 months. No results found for this basename: LDLCALC   Labs next office visit

## 2011-07-13 NOTE — Assessment & Plan Note (Signed)
Improved continue current meds ?

## 2011-07-13 NOTE — Patient Instructions (Signed)
Your physician wants you to follow-up in:  6 MONTHS WITH DR NISHAN  You will receive a reminder letter in the mail two months in advance. If you don't receive a letter, please call our office to schedule the follow-up appointment. Your physician recommends that you continue on your current medications as directed. Please refer to the Current Medication list given to you today. 

## 2011-07-15 ENCOUNTER — Other Ambulatory Visit: Payer: Self-pay | Admitting: Family Medicine

## 2011-07-19 ENCOUNTER — Other Ambulatory Visit: Payer: Self-pay | Admitting: Family Medicine

## 2011-08-05 ENCOUNTER — Other Ambulatory Visit: Payer: Self-pay | Admitting: Family Medicine

## 2011-08-26 ENCOUNTER — Ambulatory Visit (INDEPENDENT_AMBULATORY_CARE_PROVIDER_SITE_OTHER)
Admission: RE | Admit: 2011-08-26 | Discharge: 2011-08-26 | Disposition: A | Discharge status: Home / Self Care | Payer: Medicare Other | Source: Ambulatory Visit | Attending: Family Medicine | Admitting: Family Medicine

## 2011-08-26 ENCOUNTER — Encounter: Payer: Self-pay | Admitting: Family Medicine

## 2011-08-26 ENCOUNTER — Ambulatory Visit (INDEPENDENT_AMBULATORY_CARE_PROVIDER_SITE_OTHER): Payer: Medicare Other | Admitting: Family Medicine

## 2011-08-26 ENCOUNTER — Ambulatory Visit: Payer: Medicare Other | Admitting: Family Medicine

## 2011-08-26 VITALS — BP 130/80 | HR 56 | Temp 97.5°F | Wt 185.8 lb

## 2011-08-26 DIAGNOSIS — M25559 Pain in unspecified hip: Secondary | ICD-10-CM | POA: Diagnosis not present

## 2011-08-26 DIAGNOSIS — M5137 Other intervertebral disc degeneration, lumbosacral region: Secondary | ICD-10-CM

## 2011-08-26 MED ORDER — GABAPENTIN 300 MG PO CAPS: 300.0000 mg | ORAL_CAPSULE | Freq: Three times a day (TID) | ORAL | Status: DC

## 2011-08-26 NOTE — Patient Instructions (Signed)
Good to see you.  We will call you with results of your xray. Restart gabapentin- up to 300 mg three times daily.

## 2011-08-26 NOTE — Progress Notes (Signed)
71 yo here to follow up .  Having persistent left hip pain and left back pain associated radiculopathy. Was referred by Dr. Sedonia Small, her orthopedist, to Dr. Venetia Maxon (neuro).   No urinary incontinence or saddle anesthesia.   Was told not a neurosurgical issue, spine injections have not helped.  Pain in left hip so bad, it wakes her up from her sleep.  No LLE weakness.  Patient Active Problem List  Diagnoses  . VITAMIN D DEFICIENCY  . HYPERLIPIDEMIA  . CERUMEN IMPACTION, RIGHT  . HYPERTENSION  . OTHER ACUTE SINUSITIS  . VERTIGO  . INSOMNIA  . DYSURIA  . Routine general medical examination at a health care facility  . DDD (degenerative disc disease), lumbosacral  . Radiculopathy, lumbar region  . Hip pain   Past Medical History  Diagnosis Date  . Hyperlipidemia   . Hypertension   . Vertigo   . H/O: knee surgery     right knee meniscal tear  . VITAMIN D DEFICIENCY   . VERTIGO   . Routine general medical examination at a health care facility   . Radiculopathy, lumbar region   . Other acute sinusitis   . INSOMNIA   . Dysuria   . DDD (degenerative disc disease), lumbosacral   . CERUMEN IMPACTION, RIGHT    No past surgical history on file. History  Substance Use Topics  . Smoking status: Former Games developer  . Smokeless tobacco: Not on file  . Alcohol Use: No   Family History  Problem Relation Age of Onset  . COPD Father    Allergies  Allergen Reactions  . Codeine   . Sulfonamide Derivatives    Current Outpatient Prescriptions on File Prior to Visit  Medication Sig Dispense Refill  . acetaminophen (TYLENOL) 500 MG tablet Take 500 mg by mouth as needed.        . Eszopiclone (ESZOPICLONE) 3 MG TABS Take 1 tablet (3 mg total) by mouth at bedtime. Take immediately before bedtime  30 tablet  0  . hydrochlorothiazide (HYDRODIURIL) 25 MG tablet TAKE 1 TABLET BY MOUTH EVERY DAY  30 tablet  12  . HYDROcodone-acetaminophen (LORTAB) 7.5-500 MG per tablet Take one tablet by  mouth every 4-6 hours as needed for pain  60 tablet  0  . lisinopril (PRINIVIL,ZESTRIL) 40 MG tablet Take 1 tablet (40 mg total) by mouth daily.  30 tablet  11  . nebivolol (BYSTOLIC) 10 MG tablet Take 1 tablet (10 mg total) by mouth daily.  30 tablet  12  . traMADol (ULTRAM) 50 MG tablet TAKE 1 TABLET BY MOUTH EVERY 6 HOURS AS NEEDED FOR KNEE PAIN  60 tablet  0   The PMH, PSH, Social History, Family History, Medications, and allergies have been reviewed in [facility], and have been updated if relevant.   Review of Systems  See HPI  GI: Denies constipation, diarrhea, nausea, and vomiting.  GU: Denies incontinence.   Physical Exam  BP 130/80  Pulse 56  Temp(Src) 97.5 F (36.4 C) (Oral)  Wt 185 lb 12 oz (84.256 kg)  General: Well-developed,well-nourished,in no acute distress; alert,appropriate and cooperative throughout examination  Psych: Cognition and judgment appear intact. Alert and cooperative with normal attention span and concentration. No apparent delusions, illusions, hallucinations Msk:  Not TTP over left hip but does have some limited ROM.  Assessment and Plan: 1. DDD (degenerative disc disease), lumbosacral   Deteriorated.  Advised following up again with Dr. Sedonia Small.  Will restart Gabapentin.   2. Hip pain  Deteriorated. See above. Will order hip films. DG Hip Complete Left

## 2011-08-27 ENCOUNTER — Other Ambulatory Visit: Payer: Self-pay | Admitting: *Deleted

## 2011-08-27 MED ORDER — ESZOPICLONE 3 MG PO TABS: 3.0000 mg | ORAL_TABLET | Freq: Every day | ORAL | Status: DC

## 2011-08-27 NOTE — Telephone Encounter (Signed)
Rx called to pharmacy

## 2011-08-30 DIAGNOSIS — N949 Unspecified condition associated with female genital organs and menstrual cycle: Secondary | ICD-10-CM | POA: Diagnosis not present

## 2011-08-30 DIAGNOSIS — D259 Leiomyoma of uterus, unspecified: Secondary | ICD-10-CM | POA: Diagnosis not present

## 2011-08-31 ENCOUNTER — Other Ambulatory Visit: Payer: Self-pay | Admitting: *Deleted

## 2011-08-31 MED ORDER — TRAMADOL HCL 50 MG PO TABS: 50.0000 mg | ORAL_TABLET | Freq: Four times a day (QID) | ORAL | Status: DC | PRN

## 2011-09-20 ENCOUNTER — Other Ambulatory Visit: Payer: Self-pay | Admitting: *Deleted

## 2011-09-20 MED ORDER — HYDROCODONE-ACETAMINOPHEN 7.5-500 MG PO TABS: ORAL_TABLET | ORAL | Status: DC

## 2011-09-20 NOTE — Telephone Encounter (Signed)
[facility]

## 2011-10-05 ENCOUNTER — Other Ambulatory Visit: Payer: Self-pay | Admitting: Family Medicine

## 2011-11-08 ENCOUNTER — Other Ambulatory Visit: Payer: Self-pay | Admitting: Family Medicine

## 2011-11-15 ENCOUNTER — Other Ambulatory Visit: Payer: Self-pay | Admitting: *Deleted

## 2011-11-15 NOTE — Telephone Encounter (Signed)
[facility] [place], last filled date not given.

## 2011-11-16 MED ORDER — HYDROCODONE-ACETAMINOPHEN 7.5-500 MG PO TABS: ORAL_TABLET | ORAL | Status: DC

## 2011-11-16 NOTE — Telephone Encounter (Signed)
[facility] 

## 2011-12-22 ENCOUNTER — Other Ambulatory Visit: Payer: Self-pay | Admitting: Family Medicine

## 2011-12-30 ENCOUNTER — Other Ambulatory Visit: Payer: Self-pay | Admitting: *Deleted

## 2011-12-30 MED ORDER — HYDROCODONE-ACETAMINOPHEN 7.5-500 MG PO TABS: ORAL_TABLET | ORAL | Status: DC

## 2011-12-30 NOTE — Telephone Encounter (Signed)
Medicine called to pharmacy. 

## 2011-12-30 NOTE — Telephone Encounter (Signed)
[facility] [place], last filled date not given.

## 2012-01-04 DIAGNOSIS — N949 Unspecified condition associated with female genital organs and menstrual cycle: Secondary | ICD-10-CM | POA: Diagnosis not present

## 2012-01-04 DIAGNOSIS — N3010 Interstitial cystitis (chronic) without hematuria: Secondary | ICD-10-CM | POA: Diagnosis not present

## 2012-02-01 ENCOUNTER — Other Ambulatory Visit: Payer: Self-pay | Admitting: Family Medicine

## 2012-02-04 ENCOUNTER — Other Ambulatory Visit: Payer: Self-pay | Admitting: *Deleted

## 2012-02-04 MED ORDER — HYDROCODONE-ACETAMINOPHEN 7.5-500 MG PO TABS: ORAL_TABLET | ORAL | Status: DC

## 2012-02-04 NOTE — Telephone Encounter (Signed)
[facility] [place]

## 2012-02-04 NOTE — Telephone Encounter (Signed)
Medicine called to pharmacy. 

## 2012-02-18 ENCOUNTER — Other Ambulatory Visit: Payer: Self-pay | Admitting: *Deleted

## 2012-02-18 NOTE — Telephone Encounter (Signed)
[facility] [place] 

## 2012-02-19 MED ORDER — ESZOPICLONE 3 MG PO TABS: 3.0000 mg | ORAL_TABLET | Freq: Every day | ORAL | Status: DC

## 2012-02-21 NOTE — Telephone Encounter (Signed)
Medicine called to pharamacy.

## 2012-03-02 ENCOUNTER — Telehealth: Payer: Self-pay | Admitting: Cardiovascular Disease

## 2012-03-02 NOTE — Telephone Encounter (Signed)
New Problem:     I called the patient and was unable to reach them. I left a message on their voicemail with my name, the reason I called, the name of his physician, and a number to call back to schedule their appointment. 

## 2012-03-06 ENCOUNTER — Other Ambulatory Visit: Payer: Self-pay | Admitting: Family Medicine

## 2012-04-03 ENCOUNTER — Other Ambulatory Visit: Payer: Self-pay | Admitting: *Deleted

## 2012-04-03 MED ORDER — ESZOPICLONE 3 MG PO TABS: 3.0000 mg | ORAL_TABLET | Freq: Every day | ORAL | Status: DC

## 2012-04-03 NOTE — Telephone Encounter (Signed)
Received faxed refill request from pharmacy. Last office visit 08/26/11. Is it okay to refill medication?

## 2012-04-03 NOTE — Telephone Encounter (Signed)
Medicine called to pharmacy. 

## 2012-04-06 ENCOUNTER — Other Ambulatory Visit: Payer: Self-pay | Admitting: *Deleted

## 2012-04-06 MED ORDER — HYDROCODONE-ACETAMINOPHEN 7.5-500 MG PO TABS: ORAL_TABLET | ORAL | Status: DC

## 2012-04-06 NOTE — Telephone Encounter (Signed)
Medicine called to pharmacy. 

## 2012-04-06 NOTE — Telephone Encounter (Signed)
[facility] [place], last filled 02/04/12

## 2012-04-10 ENCOUNTER — Ambulatory Visit: Payer: Medicare Other | Admitting: Cardiovascular Disease

## 2012-04-11 ENCOUNTER — Ambulatory Visit (INDEPENDENT_AMBULATORY_CARE_PROVIDER_SITE_OTHER): Payer: Medicare Other | Admitting: Family Medicine

## 2012-04-11 ENCOUNTER — Encounter: Payer: Self-pay | Admitting: Family Medicine

## 2012-04-11 VITALS — BP 142/80 | HR 68 | Temp 99.0°F | Wt 213.0 lb

## 2012-04-11 DIAGNOSIS — J329 Chronic sinusitis, unspecified: Secondary | ICD-10-CM | POA: Diagnosis not present

## 2012-04-11 MED ORDER — AZITHROMYCIN 250 MG PO TABS: ORAL_TABLET | ORAL | Status: DC

## 2012-04-11 MED ORDER — MECLIZINE HCL 25 MG PO TABS: 25.0000 mg | ORAL_TABLET | Freq: Two times a day (BID) | ORAL | Status: DC

## 2012-04-11 NOTE — Patient Instructions (Addendum)
Take Zpack as directed.  Drink lots of fluids.    Treat sympotmatically with Mucinex, nasal saline irrigation, and Tylenol/Ibuprofen. Also try claritin or zyrtec  over the counter- two times a day as needed ( have to sign for them at pharmacy). You can use warm compresses.    Call if not improving as expected in 5-7 days.

## 2012-04-11 NOTE — Progress Notes (Signed)
SUBJECTIVE:  Heather Shaw is a 71 y.o. female who complains of coryza, congestion and bilateral sinus pain for 8 days. She denies a history of anorexia, chest pain, chills and dizziness and denies a history of asthma. Patient denies smoke cigarettes.   Patient Active Problem List  Diagnosis  . VITAMIN D DEFICIENCY  . HYPERLIPIDEMIA  . CERUMEN IMPACTION, RIGHT  . HYPERTENSION  . OTHER ACUTE SINUSITIS  . VERTIGO  . INSOMNIA  . DYSURIA  . Routine general medical examination at a health care facility  . DDD (degenerative disc disease), lumbosacral  . Radiculopathy, lumbar region  . Hip pain   Past Medical History  Diagnosis Date  . Hyperlipidemia   . Hypertension   . Vertigo   . H/O: knee surgery     right knee meniscal tear  . VITAMIN D DEFICIENCY   . VERTIGO   . Routine general medical examination at a health care facility   . Radiculopathy, lumbar region   . Other acute sinusitis   . INSOMNIA   . Dysuria   . DDD (degenerative disc disease), lumbosacral   . CERUMEN IMPACTION, RIGHT    No past surgical history on file. History  Substance Use Topics  . Smoking status: Former Games developer  . Smokeless tobacco: Not on file  . Alcohol Use: No   Family History  Problem Relation Age of Onset  . COPD Father    Allergies  Allergen Reactions  . Codeine   . Sulfonamide Derivatives    Current Outpatient Prescriptions on File Prior to Visit  Medication Sig Dispense Refill  . acetaminophen (TYLENOL) 500 MG tablet Take 500 mg by mouth as needed.        . Eszopiclone (ESZOPICLONE) 3 MG TABS Take 1 tablet (3 mg total) by mouth at bedtime. Take immediately before bedtime  30 tablet  0  . hydrochlorothiazide (HYDRODIURIL) 25 MG tablet TAKE 1 TABLET BY MOUTH EVERY DAY  30 tablet  12  . HYDROcodone-acetaminophen (LORTAB) 7.5-500 MG per tablet Take one tablet by mouth every 4-6 hours as needed for pain  60 tablet  0  . lisinopril (PRINIVIL,ZESTRIL) 40 MG tablet Take 1 tablet (40 mg  total) by mouth daily.  30 tablet  11  . nebivolol (BYSTOLIC) 10 MG tablet Take 1 tablet (10 mg total) by mouth daily.  30 tablet  12  . traMADol (ULTRAM) 50 MG tablet TAKE 1 TABLET (50 MG TOTAL) BY MOUTH EVERY 6 (SIX) HOURS AS NEEDED FOR PAIN.  60 tablet  0   The PMH, PSH, Social History, Family History, Medications, and allergies have been reviewed in [facility] and have been updated if relevant.  OBJECTIVE: BP 142/80  Pulse 68  Temp 99 F (37.2 C)  Wt 213 lb (96.616 kg)  She appears well, vital signs are as noted. Ears normal.  Throat and pharynx normal.  Neck supple. No adenopathy in the neck. Nose is congested. Sinuses  tender. The chest is clear, without wheezes or rales.  ASSESSMENT:  sinusitis  PLAN: Given duration and progression of symptoms, will treat for bacterial sinusitis. Symptomatic therapy suggested: push fluids, rest and return office visit prn if symptoms persist or worsen.  Call or return to clinic prn if these symptoms worsen or fail to improve as anticipated.

## 2012-05-10 ENCOUNTER — Ambulatory Visit: Payer: Medicare Other | Admitting: Cardiovascular Disease

## 2012-05-10 DIAGNOSIS — M999 Biomechanical lesion, unspecified: Secondary | ICD-10-CM | POA: Diagnosis not present

## 2012-05-10 DIAGNOSIS — M5137 Other intervertebral disc degeneration, lumbosacral region: Secondary | ICD-10-CM | POA: Diagnosis not present

## 2012-05-10 DIAGNOSIS — M5030 Other cervical disc degeneration, unspecified cervical region: Secondary | ICD-10-CM | POA: Diagnosis not present

## 2012-05-10 DIAGNOSIS — M9981 Other biomechanical lesions of cervical region: Secondary | ICD-10-CM | POA: Diagnosis not present

## 2012-05-12 ENCOUNTER — Other Ambulatory Visit: Payer: Self-pay | Admitting: Family Medicine

## 2012-05-12 NOTE — Telephone Encounter (Signed)
Med[facility]. 

## 2012-05-22 ENCOUNTER — Ambulatory Visit: Payer: Medicare Other | Admitting: Family Medicine

## 2012-05-22 NOTE — Progress Notes (Signed)
Cancelled appt

## 2012-05-24 ENCOUNTER — Other Ambulatory Visit (INDEPENDENT_AMBULATORY_CARE_PROVIDER_SITE_OTHER): Payer: Medicare Other

## 2012-05-24 ENCOUNTER — Other Ambulatory Visit: Payer: Self-pay | Admitting: Family Medicine

## 2012-05-24 DIAGNOSIS — E785 Hyperlipidemia, unspecified: Secondary | ICD-10-CM

## 2012-05-24 DIAGNOSIS — I10 Essential (primary) hypertension: Secondary | ICD-10-CM

## 2012-05-24 DIAGNOSIS — Z0000 Encounter for general adult medical examination without abnormal findings: Secondary | ICD-10-CM

## 2012-05-24 LAB — COMPREHENSIVE METABOLIC PANEL
ALT: 14 U/L (ref 0–35)
AST: 16 U/L (ref 0–37)
Albumin: 3.7 g/dL (ref 3.5–5.2)
Calcium: 9.3 mg/dL (ref 8.4–10.5)
Chloride: 102 mEq/L (ref 96–112)
Potassium: 4 mEq/L (ref 3.5–5.1)

## 2012-05-24 LAB — LDL CHOLESTEROL, DIRECT: Direct LDL: 132.8 mg/dL

## 2012-05-26 ENCOUNTER — Other Ambulatory Visit: Payer: Self-pay | Admitting: Family Medicine

## 2012-05-29 ENCOUNTER — Ambulatory Visit (INDEPENDENT_AMBULATORY_CARE_PROVIDER_SITE_OTHER): Payer: Medicare Other | Admitting: Family Medicine

## 2012-05-29 ENCOUNTER — Encounter: Payer: Self-pay | Admitting: Family Medicine

## 2012-05-29 VITALS — BP 150/78 | HR 64 | Temp 98.4°F | Ht 64.5 in | Wt 215.0 lb

## 2012-05-29 DIAGNOSIS — Z23 Encounter for immunization: Secondary | ICD-10-CM

## 2012-05-29 DIAGNOSIS — I10 Essential (primary) hypertension: Secondary | ICD-10-CM

## 2012-05-29 DIAGNOSIS — E785 Hyperlipidemia, unspecified: Secondary | ICD-10-CM

## 2012-05-29 DIAGNOSIS — Z0000 Encounter for general adult medical examination without abnormal findings: Secondary | ICD-10-CM | POA: Diagnosis not present

## 2012-05-29 NOTE — Progress Notes (Signed)
71 yo here to for [facility] Annual Wellness Visit.  I have personally reviewed the Medicare Annual Wellness questionnaire and have noted 1. The patient's medical and social history 2. Their use of alcohol, tobacco or illicit drugs 3. Their current medications and supplements 4. The patient's functional ability including ADL's, fall risks, home safety risks and hearing or visual             impairment. 5. Diet and physical activities 6. Evidence for depression or mood disorders  Unfortunately, her son in law recently died unexpectedly.  She feels she is coping with this ok. She is more concerned about how her daughter and grandchildren are handling it. Denies feeling anxious or depressed.  HTN- on HCTZ 25 mg.  No CP, SOB, HA or blurred vision. Lab Results  Component Value Date   CREATININE 0.7 05/24/2012    Hyperlipidemia- was on Lipitor. Gave her myalgias and nausea.   Not taking anything right now.  TG remain elevated but admits to eating a lot sugar and carbohydrates recently.  Her weight has also gone up. Lab Results  Component Value Date   CHOL 213* 05/24/2012   HDL 33.00* 05/24/2012   LDLDIRECT 132.8 05/24/2012   TRIG 285.0* 05/24/2012   CHOLHDL 6 05/24/2012    IC- followed by Dr. Isabel Caprice.  Symptoms are controlled with Uribel.  Well woman- UTD on mammogram, bone scan, pap smear (sees Dr. Jennette Kettle).   Due for flu shot, pneumovax.  Patient Active Problem List  Diagnosis  . VITAMIN D DEFICIENCY  . HYPERLIPIDEMIA  . CERUMEN IMPACTION, RIGHT  . HYPERTENSION  . OTHER ACUTE SINUSITIS  . VERTIGO  . INSOMNIA  . DYSURIA  . Routine general medical examination at a health care facility  . DDD (degenerative disc disease), lumbosacral  . Radiculopathy, lumbar region  . Hip pain   Past Medical History  Diagnosis Date  . Hyperlipidemia   . Hypertension   . Vertigo   . H/O: knee surgery     right knee meniscal tear  . VITAMIN D DEFICIENCY   . VERTIGO   . Routine general  medical examination at a health care facility   . Radiculopathy, lumbar region   . Other acute sinusitis   . INSOMNIA   . Dysuria   . DDD (degenerative disc disease), lumbosacral   . CERUMEN IMPACTION, RIGHT    No past surgical history on file. History  Substance Use Topics  . Smoking status: Former Games developer  . Smokeless tobacco: Not on file  . Alcohol Use: No   Family History  Problem Relation Age of Onset  . COPD Father    Allergies  Allergen Reactions  . Codeine   . Sulfonamide Derivatives    Current Outpatient Prescriptions on File Prior to Visit  Medication Sig Dispense Refill  . acetaminophen (TYLENOL) 500 MG tablet Take 500 mg by mouth as needed.        . Eszopiclone (ESZOPICLONE) 3 MG TABS Take 1 tablet (3 mg total) by mouth at bedtime. Take immediately before bedtime  30 tablet  0  . hydrochlorothiazide (HYDRODIURIL) 25 MG tablet TAKE 1 TABLET BY MOUTH EVERY DAY  30 tablet  12  . HYDROcodone-acetaminophen (LORTAB) 7.5-500 MG per tablet TAKE 1 TABLET BY MOUTH EVERY 4 TO 6 HOURS AS NEEDED FOR PAIN  60 tablet  0  . lisinopril (PRINIVIL,ZESTRIL) 40 MG tablet Take 1 tablet (40 mg total) by mouth daily.  30 tablet  11  . meclizine (ANTIVERT) 25 MG  tablet Take 1 tablet (25 mg total) by mouth 2 (two) times daily.  30 tablet  0  . Meth-Hyo-M Bl-Na Phos-Ph Sal (URIBEL PO) Take by mouth.      . nebivolol (BYSTOLIC) 10 MG tablet Take 1 tablet (10 mg total) by mouth daily.  30 tablet  12  . traMADol (ULTRAM) 50 MG tablet TAKE 1 TABLET (50 MG TOTAL) BY MOUTH EVERY 6 (SIX) HOURS AS NEEDED FOR PAIN.  60 tablet  0     The PMH, PSH, Social History, Family History, Medications, and allergies have been reviewed in [facility], and have been updated if relevant.   Review of Systems       See HPI Patient reports no  vision/ hearing changes,anorexia, weight change, fever ,adenopathy, persistant / recurrent hoarseness, swallowing issues, chest pain, edema,persistant / recurrent cough, hemoptysis,  dyspnea(rest, exertional, paroxysmal nocturnal), gastrointestinal  bleeding (melena, rectal bleeding), abdominal pain, excessive heart burn, GU symptoms(dysuria, hematuria, pyuria, voiding/incontinence  Issues) syncope, focal weakness, severe memory loss, concerning skin lesions, depression, anxiety, abnormal bruising/bleeding, major joint swelling, breast masses or abnormal vaginal bleeding.     Physical Exam BP 150/78  Pulse 64  Temp 98.4 F (36.9 C)  Ht 5' 4.5" (1.638 m)  Wt 215 lb (97.523 kg)  BMI 36.33 kg/m2  General:  Well-developed,well-nourished,in no acute distress; alert,appropriate and cooperative throughout examination Ears:  R ear normal and L ear normal.   Mouth:  Oral mucosa and oropharynx without lesions or exudates.  Teeth in good repair. Lungs:  Normal respiratory effort, chest expands symmetrically. Lungs are clear to auscultation, no crackles or wheezes. Heart:  Normal rate and regular rhythm. S1 and S2 normal without gallop, murmur, click, rub or other extra sounds. Abdomen:  Bowel sounds positive,abdomen soft and non-tender without masses, organomegaly or hernias noted. Extremities:  No clubbing, cyanosis, edema, or deformity noted with normal full range of motion of all joints.   Skin:  Intact without suspicious lesions or rashes Psych:  Cognition and judgment appear intact. Alert and cooperative with normal attention span and concentration. No apparent delusions, illusions, hallucinations  Assessment and Plan: 1. HYPERLIPIDEMIA  LDL improved but TG deteriorated. Discussed conservative ways to decrease TG as she continues to be reluctant to starting medication.  Recheck TG in 3 months.  If remains elevated at that time, she agrees to start Tricor.  2. HYPERTENSION  A little elevated today- has not yet had her medication this morning. Asymptomatic.  No changes.  3. Routine general medical examination at a health care facility   The patients weight, height, BMI and  visual acuity have been recorded in the chart I have made referrals, counseling and provided education to the patient based review of the above and I have provided the pt with a written personalized care plan for preventive services.

## 2012-05-29 NOTE — Patient Instructions (Addendum)
Triglycerides are too high.  Weight loss (even small amount) can decrease triglycerides.  Decrease added sugars, eliminate trans fats, increase fiber and limit alcohol.  All these changes together can drop triglycerides by almost 50%.  Let's recheck your cholesterol in 3 - 6 months.  I am so sorry about your son in law.

## 2012-05-29 NOTE — Addendum Note (Signed)
Addended by: Eliezer Bottom on: 05/29/2012 09:11 AM   Modules accepted: Orders

## 2012-05-30 ENCOUNTER — Other Ambulatory Visit: Payer: Self-pay | Admitting: Family Medicine

## 2012-06-12 ENCOUNTER — Ambulatory Visit: Payer: Medicare Other | Admitting: Cardiovascular Disease

## 2012-06-13 ENCOUNTER — Other Ambulatory Visit: Payer: Self-pay | Admitting: *Deleted

## 2012-06-13 MED ORDER — LISINOPRIL 40 MG PO TABS: 40.0000 mg | ORAL_TABLET | Freq: Every day | ORAL | Status: DC

## 2012-06-26 ENCOUNTER — Telehealth: Payer: Self-pay | Admitting: Cardiovascular Disease

## 2012-06-26 NOTE — Telephone Encounter (Signed)
plz return call to pt [phone], to discuss Bystolic medication.  Pt only has one pill left

## 2012-06-26 NOTE — Telephone Encounter (Signed)
BUSY SIGNAL./CY 

## 2012-06-27 MED ORDER — NEBIVOLOL HCL 10 MG PO TABS: 10.0000 mg | ORAL_TABLET | Freq: Every day | ORAL | Status: DC

## 2012-06-27 NOTE — Telephone Encounter (Signed)
Pt returning nurse call, she can be reached at [phone]

## 2012-06-27 NOTE — Telephone Encounter (Signed)
[facility]  10 MG  QD  NUMBER 30 WITH 3 REFILLS./CY

## 2012-06-27 NOTE — Telephone Encounter (Signed)
LMTCB ./CY 

## 2012-07-04 ENCOUNTER — Other Ambulatory Visit: Payer: Self-pay | Admitting: Family Medicine

## 2012-07-04 NOTE — Telephone Encounter (Signed)
Medicine called to pharmacy. 

## 2012-07-14 ENCOUNTER — Ambulatory Visit: Payer: Medicare Other | Admitting: Cardiovascular Disease

## 2012-07-21 ENCOUNTER — Other Ambulatory Visit: Payer: Self-pay | Admitting: Family Medicine

## 2012-07-24 ENCOUNTER — Other Ambulatory Visit: Payer: Self-pay | Admitting: Family Medicine

## 2012-07-24 NOTE — Telephone Encounter (Signed)
[facility] 

## 2012-08-09 DIAGNOSIS — M5137 Other intervertebral disc degeneration, lumbosacral region: Secondary | ICD-10-CM | POA: Diagnosis not present

## 2012-08-09 DIAGNOSIS — IMO0002 Reserved for concepts with insufficient information to code with codable children: Secondary | ICD-10-CM | POA: Diagnosis not present

## 2012-08-09 DIAGNOSIS — M999 Biomechanical lesion, unspecified: Secondary | ICD-10-CM | POA: Diagnosis not present

## 2012-08-11 DIAGNOSIS — IMO0002 Reserved for concepts with insufficient information to code with codable children: Secondary | ICD-10-CM | POA: Diagnosis not present

## 2012-08-11 DIAGNOSIS — M999 Biomechanical lesion, unspecified: Secondary | ICD-10-CM | POA: Diagnosis not present

## 2012-08-11 DIAGNOSIS — M5137 Other intervertebral disc degeneration, lumbosacral region: Secondary | ICD-10-CM | POA: Diagnosis not present

## 2012-08-15 ENCOUNTER — Other Ambulatory Visit: Payer: Self-pay | Admitting: Family Medicine

## 2012-08-16 DIAGNOSIS — M5137 Other intervertebral disc degeneration, lumbosacral region: Secondary | ICD-10-CM | POA: Diagnosis not present

## 2012-08-16 DIAGNOSIS — IMO0002 Reserved for concepts with insufficient information to code with codable children: Secondary | ICD-10-CM | POA: Diagnosis not present

## 2012-08-16 DIAGNOSIS — M999 Biomechanical lesion, unspecified: Secondary | ICD-10-CM | POA: Diagnosis not present

## 2012-08-18 ENCOUNTER — Ambulatory Visit (INDEPENDENT_AMBULATORY_CARE_PROVIDER_SITE_OTHER): Payer: Medicare Other | Admitting: Cardiovascular Disease

## 2012-08-18 ENCOUNTER — Encounter: Payer: Self-pay | Admitting: Cardiovascular Disease

## 2012-08-18 VITALS — BP 150/80 | HR 68 | Wt 216.0 lb

## 2012-08-18 DIAGNOSIS — I10 Essential (primary) hypertension: Secondary | ICD-10-CM | POA: Diagnosis not present

## 2012-08-18 MED ORDER — NEBIVOLOL HCL 10 MG PO TABS: 10.0000 mg | ORAL_TABLET | Freq: Every day | ORAL | Status: DC

## 2012-08-18 NOTE — Progress Notes (Signed)
Patient ID: Heather Shaw, female   DOB: 1940/07/31, 72 y.o.   MRN: [id number] 72 yo referred by primary for HTN. Recent worsening. Seen in ER at [facility] and given PRN clonidine which probably not helping fluctuations. She is compliant with her lisinopril and has been on diuretic for years. No unusual stress but has a white coat component. Has an [place] BP cuff and most readings at home are high. Seems real nervous. No history of chronic kidney disease. Some salt in diet. No stimulants or cold meds. Denies ETOH and quit smoking over 25 years ago. No SSCP, palpitations or syncope  Her daughters husband died and husband had a ruptured colon so her stress levels have been high  ROS: Denies fever, malais, weight loss, blurry vision, decreased visual acuity, cough, sputum, SOB, hemoptysis, pleuritic pain, palpitaitons, heartburn, abdominal pain, melena, lower extremity edema, claudication, or rash.  All other systems reviewed and negative  General: Affect appropriate Healthy:  appears stated age HEENT: normal Neck supple with no adenopathy JVP normal no bruits no thyromegaly Lungs clear with no wheezing and good diaphragmatic motion Heart:  S1/S2 no murmur, no rub, gallop or click PMI normal Abdomen: benighn, BS positve, no tenderness, no AAA no bruit.  No HSM or HJR Distal pulses intact with no bruits No edema Neuro non-focal Skin warm and dry No muscular weakness   Current Outpatient Prescriptions  Medication Sig Dispense Refill  . acetaminophen (TYLENOL) 500 MG tablet Take 500 mg by mouth as needed.        . Eszopiclone (ESZOPICLONE) 3 MG TABS       . hydrochlorothiazide (HYDRODIURIL) 25 MG tablet TAKE 1 TABLET BY MOUTH EVERY DAY  30 tablet  5  . HYDROcodone-acetaminophen (LORTAB) 7.5-500 MG per tablet TAKE 1 TABLET BY MOUTH DAILY AT BEDTIME  60 tablet  0  . lisinopril (PRINIVIL,ZESTRIL) 40 MG tablet Take 1 tablet (40 mg total) by mouth daily.  30 tablet  11  . meclizine (ANTIVERT) 25  MG tablet Take 25 mg by mouth as needed.      . Meth-Hyo-M Bl-Na Phos-Ph Sal (URIBEL PO) Take by mouth as needed.       . nebivolol (BYSTOLIC) 10 MG tablet Take 1 tablet (10 mg total) by mouth daily.  30 tablet  12  . traMADol (ULTRAM) 50 MG tablet TAKE 1 TABLET (50 MG TOTAL) BY MOUTH EVERY 6 (SIX) HOURS AS NEEDED FOR PAIN.  60 tablet  0    Allergies  Codeine and Sulfonamide derivatives  Electrocardiogram:  SR rate 68  LAD nonspecific T wave changes  Assessment and Plan

## 2012-08-18 NOTE — Assessment & Plan Note (Signed)
Labile but overall reasonable control  Needs to do better with salt, exercise and weight loss

## 2012-08-18 NOTE — Patient Instructions (Signed)
Your physician wants you to follow-up in:  6 MONTHS WITH DR NISHAN  You will receive a reminder letter in the mail two months in advance. If you don't receive a letter, please call our office to schedule the follow-up appointment. Your physician recommends that you continue on your current medications as directed. Please refer to the Current Medication list given to you today. 

## 2012-08-18 NOTE — Assessment & Plan Note (Signed)
Cholesterol is at goal.  Continue current dose of statin and diet Rx.  No myalgias or side effects.  F/U  LFT's in 6 months. No results found for this basename: LDLCALC             

## 2012-08-28 DIAGNOSIS — M999 Biomechanical lesion, unspecified: Secondary | ICD-10-CM | POA: Diagnosis not present

## 2012-08-28 DIAGNOSIS — M9981 Other biomechanical lesions of cervical region: Secondary | ICD-10-CM | POA: Diagnosis not present

## 2012-08-28 DIAGNOSIS — M5030 Other cervical disc degeneration, unspecified cervical region: Secondary | ICD-10-CM | POA: Diagnosis not present

## 2012-08-28 DIAGNOSIS — M5137 Other intervertebral disc degeneration, lumbosacral region: Secondary | ICD-10-CM | POA: Diagnosis not present

## 2012-09-06 ENCOUNTER — Other Ambulatory Visit: Payer: Self-pay | Admitting: Family Medicine

## 2012-09-11 ENCOUNTER — Telehealth: Payer: Self-pay

## 2012-09-11 NOTE — Telephone Encounter (Signed)
Advised patient, she says she will call back for appt.

## 2012-09-11 NOTE — Telephone Encounter (Signed)
Pt left v/m pt is having to take Hydrocodone-apap in the morning and at bedtime. Pt request prescription [facility] with twice a day directions. If pt needs to be seen pt will come in for appt.Please advise.

## 2012-09-11 NOTE — Telephone Encounter (Signed)
She needs to be seen.

## 2012-09-13 ENCOUNTER — Ambulatory Visit: Payer: Medicare Other | Admitting: Family Medicine

## 2012-09-21 ENCOUNTER — Ambulatory Visit (INDEPENDENT_AMBULATORY_CARE_PROVIDER_SITE_OTHER): Payer: Medicare Other | Admitting: Family Medicine

## 2012-09-21 ENCOUNTER — Encounter: Payer: Self-pay | Admitting: Family Medicine

## 2012-09-21 VITALS — BP 140/72 | HR 64 | Temp 98.9°F | Wt 209.0 lb

## 2012-09-21 DIAGNOSIS — M25551 Pain in right hip: Secondary | ICD-10-CM

## 2012-09-21 DIAGNOSIS — G4700 Insomnia, unspecified: Secondary | ICD-10-CM | POA: Diagnosis not present

## 2012-09-21 DIAGNOSIS — M25559 Pain in unspecified hip: Secondary | ICD-10-CM | POA: Diagnosis not present

## 2012-09-21 MED ORDER — HYDROCODONE-ACETAMINOPHEN 7.5-300 MG PO TABS: ORAL_TABLET | ORAL | Status: DC

## 2012-09-21 NOTE — Patient Instructions (Addendum)
Let's increase your lortab to twice daily as needed, continue Tramadol. See Shirlee Limerick on your way out to set up your referral with Dr. Ernest Pine.

## 2012-09-21 NOTE — Progress Notes (Addendum)
72 yo here for follow up.  Chronic hip pain (right)- sees Dr. Leslee Home but would like to see Dr. Ernest Pine.  Right hip pain radiates to "bone" in her leg.  Dr. Leslee Home wanted to do a Myelogram but she preferred to get another opinion.   She did have an MRI several years ago. Has tried PT years ago, never tried water therapy.  Taking Tramadol and as needed Loratab, mainly at night.  Pain is worsening her insomnia.  Taking Lunesta.   Patient Active Problem List  Diagnosis  . VITAMIN D DEFICIENCY  . HYPERLIPIDEMIA  . HYPERTENSION  . OTHER ACUTE SINUSITIS  . INSOMNIA  . DYSURIA  . DDD (degenerative disc disease), lumbosacral  . Radiculopathy, lumbar region  . Hip pain   Past Medical History  Diagnosis Date  . Hyperlipidemia   . Hypertension   . Vertigo   . H/O: knee surgery     right knee meniscal tear  . VITAMIN D DEFICIENCY   . VERTIGO   . Routine general medical examination at a health care facility   . Radiculopathy, lumbar region   . Other acute sinusitis   . INSOMNIA   . Dysuria   . DDD (degenerative disc disease), lumbosacral   . CERUMEN IMPACTION, RIGHT    No past surgical history on file. History  Substance Use Topics  . Smoking status: Former Games developer  . Smokeless tobacco: Not on file  . Alcohol Use: No   Family History  Problem Relation Age of Onset  . COPD Father    Allergies  Allergen Reactions  . Codeine   . Sulfonamide Derivatives    Current Outpatient Prescriptions on File Prior to Visit  Medication Sig Dispense Refill  . acetaminophen (TYLENOL) 500 MG tablet Take 500 mg by mouth as needed.        . Eszopiclone (ESZOPICLONE) 3 MG TABS       . hydrochlorothiazide (HYDRODIURIL) 25 MG tablet TAKE 1 TABLET BY MOUTH EVERY DAY  30 tablet  5  . HYDROcodone-acetaminophen (LORTAB) 7.5-500 MG per tablet TAKE 1 TABLET BY MOUTH DAILY AT BEDTIME  60 tablet  0  . lisinopril (PRINIVIL,ZESTRIL) 40 MG tablet Take 1 tablet (40 mg total) by mouth daily.  30  tablet  11  . meclizine (ANTIVERT) 25 MG tablet Take 25 mg by mouth as needed.      . Meth-Hyo-M Bl-Na Phos-Ph Sal (URIBEL PO) Take by mouth as needed.       . nebivolol (BYSTOLIC) 10 MG tablet Take 1 tablet (10 mg total) by mouth daily.  30 tablet  12  . traMADol (ULTRAM) 50 MG tablet TAKE 1 TABLET BY MOUTH EVERY 6 HOURS AS NEEDED FOR PAIN  60 tablet  0   No current facility-administered medications on file prior to visit.     The PMH, PSH, Social History, Family History, Medications, and allergies have been reviewed in [facility], and have been updated if relevant.   Review of Systems       See HPI   Pain is worse when walking  Physical Exam BP 140/72  Pulse 64  Temp(Src) 98.9 F (37.2 C)  Wt 209 lb (94.802 kg)  BMI 35.33 kg/m2  General:  Well-developed,well-nourished,in no acute distress; alert,appropriate and cooperative throughout examination Ears:  R ear normal and L ear normal.   Mouth:  Oral mucosa and oropharynx without lesions or exudates.  Teeth in good repair. Lungs:  Normal respiratory effort, chest expands symmetrically. Lungs are clear to  auscultation, no crackles or wheezes. Heart:  Normal rate and regular rhythm. S1 and S2 normal without gallop, murmur, click, rub or other extra sounds. Abdomen:  Bowel sounds positive,abdomen soft and non-tender without masses, organomegaly or hernias noted. Extremities:  No clubbing, cyanosis, edema, or deformity noted with normal full range of motion of all joints.   SLR neg bilaterally, no TTP over spine Skin:  Intact without suspicious lesions or rashes Psych:  Cognition and judgment appear intact. Alert and cooperative with normal attention span and concentration. No apparent delusions, illusions, hallucinations  Assessment and Plan:  1. INSOMNIA Deteriorated due to pain.  See below.  2. Hip pain, right Hip vs sciatica? I suspect she would also benefit from another round of PT. Will refer to another orthopedist, referral  placed.  She does not abuse pain medications- will increase her Lortab to twice daily as needed, continue Tramadol.  - Ambulatory referral to Orthopedic Surgery

## 2012-09-28 DIAGNOSIS — Z124 Encounter for screening for malignant neoplasm of cervix: Secondary | ICD-10-CM | POA: Diagnosis not present

## 2012-09-28 DIAGNOSIS — Z1231 Encounter for screening mammogram for malignant neoplasm of breast: Secondary | ICD-10-CM | POA: Diagnosis not present

## 2012-10-03 ENCOUNTER — Telehealth: Payer: Self-pay | Admitting: *Deleted

## 2012-10-03 NOTE — Telephone Encounter (Signed)
Ok to change to 7.5-325 dose.

## 2012-10-03 NOTE — Telephone Encounter (Signed)
Prior Berkley Harvey is needed for hydrocodone acetaminophen 7.5-300.  Insurance will cover 7.5- 325.  Please advise.

## 2012-10-03 NOTE — Telephone Encounter (Signed)
doseage updated

## 2012-10-06 DIAGNOSIS — IMO0002 Reserved for concepts with insufficient information to code with codable children: Secondary | ICD-10-CM | POA: Diagnosis not present

## 2012-10-06 DIAGNOSIS — M5430 Sciatica, unspecified side: Secondary | ICD-10-CM | POA: Diagnosis not present

## 2012-10-06 DIAGNOSIS — M545 Low back pain: Secondary | ICD-10-CM | POA: Diagnosis not present

## 2012-10-10 ENCOUNTER — Emergency Department: Payer: Self-pay | Admitting: Emergency Medicine

## 2012-10-10 DIAGNOSIS — R51 Headache: Secondary | ICD-10-CM | POA: Diagnosis not present

## 2012-10-10 DIAGNOSIS — I498 Other specified cardiac arrhythmias: Secondary | ICD-10-CM | POA: Diagnosis not present

## 2012-10-10 DIAGNOSIS — I10 Essential (primary) hypertension: Secondary | ICD-10-CM | POA: Diagnosis not present

## 2012-10-10 DIAGNOSIS — R52 Pain, unspecified: Secondary | ICD-10-CM | POA: Diagnosis not present

## 2012-10-10 DIAGNOSIS — Z79899 Other long term (current) drug therapy: Secondary | ICD-10-CM | POA: Diagnosis not present

## 2012-10-10 LAB — COMPREHENSIVE METABOLIC PANEL
Alkaline Phosphatase: 52 U/L (ref 50–136)
Anion Gap: 7 (ref 7–16)
BUN: 13 mg/dL (ref 7–18)
Calcium, Total: 8.8 mg/dL (ref 8.5–10.1)
Chloride: 97 mmol/L — ABNORMAL LOW (ref 98–107)
Co2: 29 mmol/L (ref 21–32)
Creatinine: 0.74 mg/dL (ref 0.60–1.30)
EGFR (African American): >60
EGFR (Non-African Amer.): >60
Glucose: 109 mg/dL — ABNORMAL HIGH (ref 65–99)
Potassium: 4.5 mmol/L (ref 3.5–5.1)
SGOT(AST): 18 U/L (ref 15–37)
SGPT (ALT): 23 U/L (ref 12–78)
Sodium: 133 mmol/L — ABNORMAL LOW (ref 136–145)
Total Protein: 7.6 g/dL (ref 6.4–8.2)

## 2012-10-10 LAB — CK TOTAL AND CKMB (NOT AT ARMC): CK-MB: 0.9 ng/mL (ref 0.5–3.6)

## 2012-10-10 LAB — CBC
MCHC: 33.4 g/dL (ref 32.0–36.0)
WBC: 10.9 10*3/uL (ref 3.6–11.0)

## 2012-10-11 DIAGNOSIS — I10 Essential (primary) hypertension: Secondary | ICD-10-CM | POA: Diagnosis not present

## 2012-10-11 DIAGNOSIS — R51 Headache: Secondary | ICD-10-CM | POA: Diagnosis not present

## 2012-10-16 ENCOUNTER — Ambulatory Visit: Payer: Self-pay | Admitting: General Practice

## 2012-10-16 DIAGNOSIS — M48061 Spinal stenosis, lumbar region without neurogenic claudication: Secondary | ICD-10-CM | POA: Diagnosis not present

## 2012-10-16 DIAGNOSIS — Z4789 Encounter for other orthopedic aftercare: Secondary | ICD-10-CM | POA: Diagnosis not present

## 2012-10-16 DIAGNOSIS — Z87821 Personal history of retained foreign body fully removed: Secondary | ICD-10-CM | POA: Diagnosis not present

## 2012-10-16 DIAGNOSIS — M545 Low back pain, unspecified: Secondary | ICD-10-CM | POA: Diagnosis not present

## 2012-10-17 ENCOUNTER — Ambulatory Visit (INDEPENDENT_AMBULATORY_CARE_PROVIDER_SITE_OTHER): Payer: Medicare Other | Admitting: Family Medicine

## 2012-10-17 ENCOUNTER — Encounter: Payer: Self-pay | Admitting: Family Medicine

## 2012-10-17 VITALS — BP 140/68 | HR 80 | Temp 98.5°F | Wt 212.0 lb

## 2012-10-17 DIAGNOSIS — J018 Other acute sinusitis: Secondary | ICD-10-CM

## 2012-10-17 MED ORDER — AMOXICILLIN 875 MG PO TABS: 875.0000 mg | ORAL_TABLET | Freq: Two times a day (BID) | ORAL | Status: DC

## 2012-10-17 NOTE — Patient Instructions (Addendum)
Take Amoxicillin as directed.  Drink lots of fluids.    Treat sympotmatically with Mucinex, nasal saline irrigation, and Tylenol/Ibuprofen.  Try over the counter Nasocort-start with 2 sprays per nostril per day...and then try to taper to 1 spray per nostril once symptoms improve.    Call if not improving as expected in 5-7 days.

## 2012-10-17 NOTE — Progress Notes (Signed)
SUBJECTIVE:  Heather Shaw is a 72 y.o. female who complains of coryza, congestion, sneezing, sore throat, dry cough and bilateral sinus pain for 8 days. She denies a history of anorexia, chest pain, chills, dizziness, fatigue and fevers and denies a history of asthma. Patient denies smoke cigarettes.   Patient Active Problem List  Diagnosis  . VITAMIN D DEFICIENCY  . HYPERLIPIDEMIA  . HYPERTENSION  . OTHER ACUTE SINUSITIS  . INSOMNIA  . DDD (degenerative disc disease), lumbosacral  . Hip pain   Past Medical History  Diagnosis Date  . Hyperlipidemia   . Hypertension   . Vertigo   . H/O: knee surgery     right knee meniscal tear  . VITAMIN D DEFICIENCY   . VERTIGO   . Routine general medical examination at a health care facility   . Radiculopathy, lumbar region   . Other acute sinusitis   . INSOMNIA   . Dysuria   . DDD (degenerative disc disease), lumbosacral   . CERUMEN IMPACTION, RIGHT    No past surgical history on file. History  Substance Use Topics  . Smoking status: Former Games developer  . Smokeless tobacco: Not on file  . Alcohol Use: No   Family History  Problem Relation Age of Onset  . COPD Father    Allergies  Allergen Reactions  . Codeine   . Prednisone     Causes elevation in blood pressure- ? All steroids cause same reaction  . Sulfonamide Derivatives    Current Outpatient Prescriptions on File Prior to Visit  Medication Sig Dispense Refill  . acetaminophen (TYLENOL) 500 MG tablet Take 500 mg by mouth as needed.        . Eszopiclone (ESZOPICLONE) 3 MG TABS       . hydrochlorothiazide (HYDRODIURIL) 25 MG tablet TAKE 1 TABLET BY MOUTH EVERY DAY  30 tablet  5  . HYDROcodone-acetaminophen (NORCO) 7.5-325 MG per tablet Take 1 tablet by mouth 2 (two) times daily.      Marland Kitchen lisinopril (PRINIVIL,ZESTRIL) 40 MG tablet Take 1 tablet (40 mg total) by mouth daily.  30 tablet  11  . meclizine (ANTIVERT) 25 MG tablet Take 25 mg by mouth as needed.      . Meth-Hyo-M  Bl-Na Phos-Ph Sal (URIBEL PO) Take by mouth as needed.       . nebivolol (BYSTOLIC) 10 MG tablet Take 1 tablet (10 mg total) by mouth daily.  30 tablet  12  . traMADol (ULTRAM) 50 MG tablet TAKE 1 TABLET BY MOUTH EVERY 6 HOURS AS NEEDED FOR PAIN  60 tablet  0   No current facility-administered medications on file prior to visit.   The PMH, PSH, Social History, Family History, Medications, and allergies have been reviewed in [facility], and have been updated if relevant.  OBJECTIVE: BP 140/68  Pulse 80  Temp(Src) 98.5 F (36.9 C)  Wt 212 lb (96.163 kg)  BMI 35.84 kg/m2  She appears well, vital signs are as noted. Ears normal.  Throat and pharynx normal.  Neck supple. No adenopathy in the neck. Nose is congested. Sinuses  tender. The chest is clear, without wheezes or rales.  ASSESSMENT:  sinusitis  PLAN: Given duration and progression of symptoms, will treat for bacterial sinusitis with 10 day course of Augmentin. Symptomatic therapy suggested: push fluids, rest and return office visit prn if symptoms persist or worsen. Call or return to clinic prn if these symptoms worsen or fail to improve as anticipated.

## 2012-10-24 ENCOUNTER — Telehealth: Payer: Self-pay

## 2012-10-24 NOTE — Telephone Encounter (Signed)
Pt left v/m; pt spoke with insurance co and they request prior authorization for hydrocodone; pt left phone number to call for PA; [phone].

## 2012-10-25 DIAGNOSIS — M5137 Other intervertebral disc degeneration, lumbosacral region: Secondary | ICD-10-CM | POA: Diagnosis not present

## 2012-10-25 DIAGNOSIS — M999 Biomechanical lesion, unspecified: Secondary | ICD-10-CM | POA: Diagnosis not present

## 2012-10-25 DIAGNOSIS — IMO0002 Reserved for concepts with insufficient information to code with codable children: Secondary | ICD-10-CM | POA: Diagnosis not present

## 2012-10-27 NOTE — Telephone Encounter (Signed)
I have attempted to call the number below 5 times and each time I am holding for extended periods.  Will try again next week.

## 2012-10-30 DIAGNOSIS — D259 Leiomyoma of uterus, unspecified: Secondary | ICD-10-CM | POA: Diagnosis not present

## 2012-10-30 DIAGNOSIS — N949 Unspecified condition associated with female genital organs and menstrual cycle: Secondary | ICD-10-CM | POA: Diagnosis not present

## 2012-10-31 ENCOUNTER — Other Ambulatory Visit: Payer: Self-pay | Admitting: Family Medicine

## 2012-10-31 NOTE — Telephone Encounter (Signed)
Medicine called to pharmacy as 5/325 dose.

## 2012-10-31 NOTE — Telephone Encounter (Signed)
Request for refill on norco 5/300.  This will have to be changed to 5/325, which is on med list, due to insurance reasons.

## 2012-11-01 NOTE — Telephone Encounter (Signed)
Hydrocodone called to pharmacy yesterday as 5/325, which insurance company should cover.

## 2012-11-03 ENCOUNTER — Other Ambulatory Visit: Payer: Self-pay | Admitting: Family Medicine

## 2012-11-28 ENCOUNTER — Other Ambulatory Visit: Payer: Self-pay | Admitting: Family Medicine

## 2012-11-29 ENCOUNTER — Other Ambulatory Visit: Payer: Self-pay | Admitting: Family Medicine

## 2012-11-29 NOTE — Telephone Encounter (Signed)
Received refill request electronically. Last office visit 10/17/12. Is it okay to refill medication?

## 2012-11-30 ENCOUNTER — Telehealth: Payer: Self-pay

## 2012-11-30 NOTE — Telephone Encounter (Signed)
Form is on your desk.

## 2012-11-30 NOTE — Telephone Encounter (Signed)
Pt left v/m that Lunesta was non formulary and ask [place] to call [phone]. I called [employer] spoke with Maudie Mercury and she will fax to [phone]. Notified pt that PA process had started. Pt said only other med taken for sleep or rest is Valium and it did not help her sleep. Pt has not tried Ambien because of info she has read about Ambien; makes pt afraid to take Ambien. Pt will also call [facility] and if can pay out of pocket will let Jacki Cones know.

## 2012-11-30 NOTE — Telephone Encounter (Signed)
[facility] 

## 2012-11-30 NOTE — Telephone Encounter (Signed)
[facility].

## 2012-12-01 NOTE — Telephone Encounter (Signed)
On my desk

## 2012-12-01 NOTE — Telephone Encounter (Signed)
Prior auth given for [employer], approval letter placed on doctor's desk for signature and scanning.  Advised pharmacy- [facility].

## 2012-12-01 NOTE — Telephone Encounter (Signed)
Form faxed

## 2012-12-08 ENCOUNTER — Other Ambulatory Visit: Payer: Self-pay | Admitting: Family Medicine

## 2012-12-21 ENCOUNTER — Other Ambulatory Visit: Payer: Self-pay | Admitting: Family Medicine

## 2012-12-21 NOTE — Telephone Encounter (Signed)
[facility]

## 2013-01-24 ENCOUNTER — Other Ambulatory Visit: Payer: Self-pay | Admitting: Family Medicine

## 2013-01-29 DIAGNOSIS — N3010 Interstitial cystitis (chronic) without hematuria: Secondary | ICD-10-CM | POA: Diagnosis not present

## 2013-02-09 ENCOUNTER — Other Ambulatory Visit: Payer: Self-pay | Admitting: Family Medicine

## 2013-02-09 NOTE — Telephone Encounter (Signed)
[facility] 

## 2013-03-12 ENCOUNTER — Other Ambulatory Visit: Payer: Self-pay | Admitting: Family Medicine

## 2013-03-12 NOTE — Telephone Encounter (Signed)
Rx called to pharmacy as instructed. 

## 2013-03-13 DIAGNOSIS — N949 Unspecified condition associated with female genital organs and menstrual cycle: Secondary | ICD-10-CM | POA: Diagnosis not present

## 2013-03-13 DIAGNOSIS — D259 Leiomyoma of uterus, unspecified: Secondary | ICD-10-CM | POA: Diagnosis not present

## 2013-03-17 ENCOUNTER — Other Ambulatory Visit: Payer: Self-pay | Admitting: Family Medicine

## 2013-04-07 ENCOUNTER — Other Ambulatory Visit: Payer: Self-pay | Admitting: Family Medicine

## 2013-04-09 NOTE — Telephone Encounter (Signed)
[facility] 

## 2013-04-20 ENCOUNTER — Other Ambulatory Visit: Payer: Self-pay | Admitting: Family Medicine

## 2013-04-20 NOTE — Telephone Encounter (Signed)
Rout to PCP 

## 2013-04-23 NOTE — Telephone Encounter (Signed)
Rx called to pharmacy

## 2013-05-09 ENCOUNTER — Ambulatory Visit (INDEPENDENT_AMBULATORY_CARE_PROVIDER_SITE_OTHER): Payer: Medicare Other | Admitting: Emergency Medicine

## 2013-05-09 VITALS — BP 124/76 | HR 60 | Temp 98.8°F | Resp 18 | Ht 65.5 in | Wt 210.0 lb

## 2013-05-09 DIAGNOSIS — J209 Acute bronchitis, unspecified: Secondary | ICD-10-CM

## 2013-05-09 DIAGNOSIS — J018 Other acute sinusitis: Secondary | ICD-10-CM | POA: Diagnosis not present

## 2013-05-09 MED ORDER — AMOXICILLIN 500 MG PO CAPS: 500.0000 mg | ORAL_CAPSULE | Freq: Three times a day (TID) | ORAL | Status: DC

## 2013-05-09 MED ORDER — HYDROCOD POLST-CHLORPHEN POLST 10-8 MG/5ML PO LQCR: 5.0000 mL | Freq: Two times a day (BID) | ORAL | Status: DC | PRN

## 2013-05-09 NOTE — Patient Instructions (Signed)

## 2013-05-09 NOTE — Progress Notes (Signed)
[facility] [address], [place] [address] [phone]  Date:  05/09/2013   Name:  Heather Shaw   DOB:  12/22/1940   MRN:  [id number]  PCP:  Ruthe Mannan, MD    Chief Complaint: Sinus Problem   History of Present Illness:  Heather Shaw is a 72 y.o. very pleasant female patient who presents with the following:  Ill since Sunday with nasal congestion and purulent drainage and post nasal drip.  Has a sore throat.  No fever or chills.  Has a cough productive of purulent sputum.  No wheezing or shortness of breath.  No stool change, rash, nausea or vomiting.  No improvement with over the counter medications or other home remedies. Denies other complaint or health concern today.   Patient Active Problem List   Diagnosis Date Noted  . Hip pain 08/26/2011  . DDD (degenerative disc disease), lumbosacral 04/07/2011  . INSOMNIA 04/06/2010  . OTHER ACUTE SINUSITIS 12/15/2009  . VITAMIN D DEFICIENCY 10/03/2009  . HYPERLIPIDEMIA 07/16/2009  . HYPERTENSION 07/16/2009    Past Medical History  Diagnosis Date  . Hyperlipidemia   . Hypertension   . Vertigo   . H/O: knee surgery     right knee meniscal tear  . VITAMIN D DEFICIENCY   . VERTIGO   . Routine general medical examination at a health care facility   . Radiculopathy, lumbar region   . Other acute sinusitis   . INSOMNIA   . Dysuria   . DDD (degenerative disc disease), lumbosacral   . CERUMEN IMPACTION, RIGHT     History reviewed. No pertinent past surgical history.  History  Substance Use Topics  . Smoking status: Former Games developer  . Smokeless tobacco: Not on file  . Alcohol Use: No    Family History  Problem Relation Age of Onset  . COPD Father     Allergies  Allergen Reactions  . Codeine   . Prednisone     Causes elevation in blood pressure- ? All steroids cause same reaction  . Sulfonamide Derivatives     Medication list has been reviewed and updated.  Current Outpatient  Prescriptions on File Prior to Visit  Medication Sig Dispense Refill  . acetaminophen (TYLENOL) 500 MG tablet Take 500 mg by mouth as needed.        Marland Kitchen amoxicillin (AMOXIL) 875 MG tablet Take 1 tablet (875 mg total) by mouth 2 (two) times daily.  20 tablet  0  . ESZOPICLONE 3 MG tablet TAKE 1 TABLET BY MOUTH AT BEDTIME  30 tablet  0  . hydrochlorothiazide (HYDRODIURIL) 25 MG tablet TAKE 1 TABLET BY MOUTH EVERY DAY  30 tablet  5  . Hydrocodone-Acetaminophen 7.5-300 MG TABS TAKE 1 TABLET BY MOUTH 2 TIMES A DAY AS NEEDED FOR PAIN  60 each  0  . lisinopril (PRINIVIL,ZESTRIL) 40 MG tablet Take 1 tablet (40 mg total) by mouth daily.  30 tablet  11  . Meth-Hyo-M Bl-Na Phos-Ph Sal (URIBEL PO) Take by mouth as needed.       . nebivolol (BYSTOLIC) 10 MG tablet Take 1 tablet (10 mg total) by mouth daily.  30 tablet  12  . traMADol (ULTRAM) 50 MG tablet TAKE 1 TABLET BY MOUTH EVERY 6 HOURS AS NEEDED FOR PAIN  60 tablet  0  . gabapentin (NEURONTIN) 300 MG capsule Take 300 mg by mouth 3 (three) times daily.      Marland Kitchen HYDROcodone-acetaminophen (NORCO) 7.5-325 MG per  tablet TAKE 1 TABLET BY MOUTH TWICE DAILY AS NEEDED FOR PAIN  60 tablet  0  . meclizine (ANTIVERT) 25 MG tablet Take 25 mg by mouth as needed.       No current facility-administered medications on file prior to visit.    Review of Systems:  As per HPI, otherwise negative.    Physical Examination: Filed Vitals:   05/09/13 1201  BP: 124/76  Pulse: 60  Temp: 98.8 F (37.1 C)  Resp: 18   Filed Vitals:   05/09/13 1201  Height: 5' 5.5" (1.664 m)  Weight: 210 lb (95.255 kg)   Body mass index is 34.4 kg/(m^2). Ideal Body Weight: Weight in (lb) to have BMI = 25: 152.2  GEN: WDWN, NAD, Non-toxic, A & O x 3 HEENT: Atraumatic, Normocephalic. Neck supple. No masses, No LAD. Ears and Nose: No external deformity. CV: RRR, No M/G/R. No JVD. No thrill. No extra heart sounds. PULM: CTA B, no wheezes, crackles, rhonchi. No retractions. No resp.  distress. No accessory muscle use. ABD: S, NT, ND, +BS. No rebound. No HSM. EXTR: No c/c/e NEURO Normal gait.  PSYCH: Normally interactive. Conversant. Not depressed or anxious appearing.  Calm demeanor.    Assessment and Plan: Sinusitis Bronchitis augmentin tussionex   Signed,  Phillips Odor, MD

## 2013-05-18 ENCOUNTER — Other Ambulatory Visit: Payer: Self-pay | Admitting: Family Medicine

## 2013-05-18 NOTE — Telephone Encounter (Signed)
Last office visit 10/17/2012.  Ok to refill?

## 2013-05-21 NOTE — Telephone Encounter (Signed)
Ok to refill 

## 2013-05-21 NOTE — Telephone Encounter (Signed)
Called to [facility].

## 2013-05-28 ENCOUNTER — Other Ambulatory Visit: Payer: Self-pay

## 2013-05-28 MED ORDER — HYDROCODONE-ACETAMINOPHEN 7.5-325 MG PO TABS: ORAL_TABLET | ORAL | Status: DC

## 2013-05-28 NOTE — Telephone Encounter (Signed)
Pt left v/m requesting rx hydrocodone apap. Call when ready for pick up.  

## 2013-05-28 NOTE — Telephone Encounter (Signed)
Patient notified that script is up front and ready for pickup. 

## 2013-05-31 ENCOUNTER — Encounter: Payer: Self-pay | Admitting: Family Medicine

## 2013-05-31 DIAGNOSIS — Z79899 Other long term (current) drug therapy: Secondary | ICD-10-CM | POA: Diagnosis not present

## 2013-06-13 ENCOUNTER — Other Ambulatory Visit: Payer: Self-pay

## 2013-06-13 MED ORDER — LISINOPRIL 40 MG PO TABS: 40.0000 mg | ORAL_TABLET | Freq: Every day | ORAL | Status: DC

## 2013-06-25 ENCOUNTER — Encounter: Payer: Self-pay | Admitting: Family Medicine

## 2013-06-28 ENCOUNTER — Telehealth: Payer: Self-pay | Admitting: *Deleted

## 2013-06-28 NOTE — Telephone Encounter (Signed)
Atenolol 25 mg daily would be fine

## 2013-06-28 NOTE — Telephone Encounter (Signed)
MEDICARE  WILL NOT  COVER BYSTOLIC  IN 2015  [facility]  ARE  ARE  ATENOLOL  METOPROLOL WILL FORWARD TO DR NISHAN./CY

## 2013-07-06 MED ORDER — NEBIVOLOL HCL 10 MG PO TABS: 10.0000 mg | ORAL_TABLET | Freq: Every day | ORAL | Status: DC

## 2013-07-06 NOTE — Telephone Encounter (Signed)
Patient wishes to get 90 day supply bystolic then change to atenolol, script sent in.

## 2013-07-10 ENCOUNTER — Other Ambulatory Visit: Payer: Self-pay | Admitting: Family Medicine

## 2013-07-10 NOTE — Telephone Encounter (Signed)
Ok to phone in tramadol 

## 2013-07-10 NOTE — Telephone Encounter (Signed)
Phoned in.

## 2013-07-23 ENCOUNTER — Telehealth: Payer: Self-pay

## 2013-07-23 ENCOUNTER — Other Ambulatory Visit: Payer: Self-pay

## 2013-07-23 MED ORDER — HYDROCODONE-ACETAMINOPHEN 7.5-325 MG PO TABS: ORAL_TABLET | ORAL | Status: DC

## 2013-07-23 NOTE — Telephone Encounter (Signed)
Rx left in front office for pick up and left detailed msg on VM letting pt know and that a valid gov ID is required for pick up

## 2013-07-23 NOTE — Telephone Encounter (Signed)
Pt request rx hydrocodone apap. Call when ready for pick up. 

## 2013-08-28 ENCOUNTER — Other Ambulatory Visit: Payer: Self-pay | Admitting: Family Medicine

## 2013-08-28 NOTE — Telephone Encounter (Signed)
Pt requesting medication refill. Last ov 09/2012 with no future appts scheduled. pls advise 

## 2013-08-28 NOTE — Telephone Encounter (Signed)
Spoke to pt and informed her Rx has been faxed to requested pharmacy 

## 2013-09-12 ENCOUNTER — Other Ambulatory Visit: Payer: Self-pay

## 2013-09-12 NOTE — Telephone Encounter (Signed)
Pt left v/m requesting rx hydrocodone apap. Pt has 4 tabs left/ pt request cb when ready for pick up.

## 2013-09-13 MED ORDER — HYDROCODONE-ACETAMINOPHEN 7.5-325 MG PO TABS: ORAL_TABLET | ORAL | Status: DC

## 2013-09-13 NOTE — Telephone Encounter (Signed)
Spoke to pt and informed her Rx is available for pickup at the front desk;pt informed a gov't issued photo id required for pickup

## 2013-09-26 ENCOUNTER — Other Ambulatory Visit: Payer: Self-pay | Admitting: Family Medicine

## 2013-10-08 ENCOUNTER — Other Ambulatory Visit: Payer: Self-pay | Admitting: Family Medicine

## 2013-10-09 NOTE — Telephone Encounter (Signed)
Spoke to pt and informed her Rx has been faxed to requested pharmacy 

## 2013-10-25 ENCOUNTER — Other Ambulatory Visit: Payer: Self-pay

## 2013-10-25 MED ORDER — LISINOPRIL 40 MG PO TABS: 40.0000 mg | ORAL_TABLET | Freq: Every day | ORAL | Status: DC

## 2013-10-26 ENCOUNTER — Telehealth: Payer: Self-pay | Admitting: Cardiovascular Disease

## 2013-10-26 ENCOUNTER — Other Ambulatory Visit: Payer: Self-pay | Admitting: *Deleted

## 2013-10-26 MED ORDER — ATENOLOL 25 MG PO TABS: 25.0000 mg | ORAL_TABLET | Freq: Every day | ORAL | Status: DC

## 2013-10-26 NOTE — Telephone Encounter (Signed)
New message      Talk to a nurse regarding bp medication.  Insurance will no longer cover current medications. Pt has approx 5 pills left.  OK to call Monday.

## 2013-10-26 NOTE — Telephone Encounter (Signed)
Calling to let us know she is almost out of her Bystolic.  There is telephone note that says when has finished the Bystolic to change her to Atenolol 25 mg due to insurance.  She really doesn't want to change because medication is working well.  Her BP has been running 140/85; 125/80. Also made her an appointment to see Dr. Johnsie Cancel on 4/24 since has been over a year that she has been.  Sent Rx to [facility]

## 2013-11-05 ENCOUNTER — Other Ambulatory Visit: Payer: Self-pay

## 2013-11-05 ENCOUNTER — Other Ambulatory Visit: Payer: Self-pay | Admitting: Family Medicine

## 2013-11-05 MED ORDER — HYDROCODONE-ACETAMINOPHEN 7.5-325 MG PO TABS: ORAL_TABLET | ORAL | Status: DC

## 2013-11-05 NOTE — Telephone Encounter (Signed)
Pt left v/m requesting rx hydrocodone apap. Call when ready for pick up. No future appt scheduled.

## 2013-11-05 NOTE — Telephone Encounter (Signed)
Spoke to pt and informed her Rx is available at the front desk for pickup; states that she may pickup at 11/08/13 appt

## 2013-11-08 ENCOUNTER — Encounter: Payer: Self-pay | Admitting: Family Medicine

## 2013-11-08 ENCOUNTER — Ambulatory Visit (INDEPENDENT_AMBULATORY_CARE_PROVIDER_SITE_OTHER): Payer: Medicare Other | Admitting: Family Medicine

## 2013-11-08 VITALS — BP 142/80 | HR 57 | Temp 98.0°F | Ht 64.0 in | Wt 209.2 lb

## 2013-11-08 DIAGNOSIS — E785 Hyperlipidemia, unspecified: Secondary | ICD-10-CM | POA: Diagnosis not present

## 2013-11-08 DIAGNOSIS — G4700 Insomnia, unspecified: Secondary | ICD-10-CM

## 2013-11-08 DIAGNOSIS — I10 Essential (primary) hypertension: Secondary | ICD-10-CM

## 2013-11-08 DIAGNOSIS — M5137 Other intervertebral disc degeneration, lumbosacral region: Secondary | ICD-10-CM | POA: Diagnosis not present

## 2013-11-08 DIAGNOSIS — Z79899 Other long term (current) drug therapy: Secondary | ICD-10-CM | POA: Diagnosis not present

## 2013-11-08 LAB — LIPID PANEL
CHOL/HDL RATIO: 5
Cholesterol: 178 mg/dL (ref 0–200)
HDL: 34.7 mg/dL — ABNORMAL LOW (ref >39.00)
LDL Cholesterol: 122 mg/dL — ABNORMAL HIGH (ref 0–99)
Triglycerides: 106 mg/dL (ref 0.0–149.0)
VLDL: 21.2 mg/dL (ref 0.0–40.0)

## 2013-11-08 LAB — COMPREHENSIVE METABOLIC PANEL
ALK PHOS: 38 U/L — AB (ref 39–117)
ALT: 15 U/L (ref 0–35)
AST: 19 U/L (ref 0–37)
Albumin: 3.5 g/dL (ref 3.5–5.2)
BILIRUBIN TOTAL: 0.6 mg/dL (ref 0.3–1.2)
BUN: 18 mg/dL (ref 6–23)
CO2: 26 mEq/L (ref 19–32)
Calcium: 9.1 mg/dL (ref 8.4–10.5)
Chloride: 104 mEq/L (ref 96–112)
Creatinine, Ser: 0.8 mg/dL (ref 0.4–1.2)
GFR: 78.59 mL/min (ref >60.00)
Glucose, Bld: 81 mg/dL (ref 70–99)
Potassium: 3.8 mEq/L (ref 3.5–5.1)
SODIUM: 136 meq/L (ref 135–145)
TOTAL PROTEIN: 6.4 g/dL (ref 6.0–8.3)

## 2013-11-08 MED ORDER — HYDROCHLOROTHIAZIDE 25 MG PO TABS: ORAL_TABLET | ORAL | Status: DC

## 2013-11-08 NOTE — Assessment & Plan Note (Signed)
Deteriorated.  Taking more tramadol.  Discussed addictive nature of this drug and advised her to see Dr. Lorelei Pont to discuss her knee. The patient indicates understanding of these issues and agrees with the plan.

## 2013-11-08 NOTE — Assessment & Plan Note (Signed)
Due for labs today. Orders Placed This Encounter  Procedures  . Comprehensive metabolic panel  . Lipid panel

## 2013-11-08 NOTE — Patient Instructions (Signed)
Good to see you. Please make an appointment to see Dr. Lorelei Pont on you way out.

## 2013-11-08 NOTE — Assessment & Plan Note (Signed)
Stable on current rx. Has appt with Dr. Johnsie Cancel later this month.

## 2013-11-08 NOTE — Progress Notes (Signed)
73 yo female here for med refills.  HTN- on atenolol 25 mg daily, HCTZ 25 mg daily, Lisinopril 40 mg daily.  Sees Dr. Johnsie Cancel.  Has appt to see him at the end of this month. No CP, SOB, HA or blurred vision.  Due for labs. Lab Results  Component Value Date   CREATININE 0.7 05/24/2012    Hyperlipidemia- was on Lipitor. Gave her myalgias and nausea.    Lab Results  Component Value Date   CHOL 213* 05/24/2012   HDL 33.00* 05/24/2012   LDLDIRECT 132.8 05/24/2012   TRIG 285.0* 05/24/2012   CHOLHDL 6 05/24/2012    IC- followed by Dr. Risa Grill.  Symptoms are controlled with Uribel.  Insomnia- stopped taking Lunesta and Gapapentin.  Sleeping fine now unless her knee pain wakes her up and she takes a Norco and goes back to sleep.  Chronic pain- hip, back (DDD) and right knee.  Has seen multiple orthopedists, including [facility].  Take tramadol and Norco. Tries not to take Norco.  Does take Tramadol daily.  She has rejoined her gym- walking every day. Wt Readings from Last 3 Encounters:  11/08/13 209 lb 4 oz (94.915 kg)  05/09/13 210 lb (95.255 kg)  10/17/12 212 lb (96.163 kg)     Due for UDS today.   Patient Active Problem List   Diagnosis Date Noted  . Hip pain 08/26/2011  . DDD (degenerative disc disease), lumbosacral 04/07/2011  . INSOMNIA 04/06/2010  . VITAMIN D DEFICIENCY 10/03/2009  . HYPERLIPIDEMIA 07/16/2009  . HYPERTENSION 07/16/2009   Past Medical History  Diagnosis Date  . Hyperlipidemia   . Hypertension   . Vertigo   . H/O: knee surgery     right knee meniscal tear  . VITAMIN D DEFICIENCY   . VERTIGO   . Routine general medical examination at a health care facility   . Radiculopathy, lumbar region   . Other acute sinusitis   . INSOMNIA   . Dysuria   . DDD (degenerative disc disease), lumbosacral   . CERUMEN IMPACTION, RIGHT    No past surgical history on file. History  Substance Use Topics  . Smoking status: Former Research scientist (life sciences)  . Smokeless  tobacco: Not on file  . Alcohol Use: No   Family History  Problem Relation Age of Onset  . COPD Father    Allergies  Allergen Reactions  . Codeine   . Prednisone     Causes elevation in blood pressure- ? All steroids cause same reaction  . Sulfonamide Derivatives    Current Outpatient Prescriptions on File Prior to Visit  Medication Sig Dispense Refill  . acetaminophen (TYLENOL) 500 MG tablet Take 500 mg by mouth as needed.        Marland Kitchen atenolol (TENORMIN) 25 MG tablet Take 1 tablet (25 mg total) by mouth daily. After patient completes bystolic medication change to atenolol, see Dr Mariana Arn office, this is for insurer purposes  30 tablet  11  . HYDROcodone-acetaminophen (NORCO) 7.5-325 MG per tablet TAKE 1 TABLET BY MOUTH TWICE DAILY AS NEEDED FOR PAIN  60 tablet  0  . lisinopril (PRINIVIL,ZESTRIL) 40 MG tablet Take 1 tablet (40 mg total) by mouth daily.  30 tablet  0  . meclizine (ANTIVERT) 25 MG tablet Take 25 mg by mouth as needed.      . Meth-Hyo-M Bl-Na Phos-Ph Sal (URIBEL PO) Take by mouth as needed.       . nebivolol (BYSTOLIC) 10 MG tablet Take 1 tablet (10  mg total) by mouth daily.  90 tablet  0  . traMADol (ULTRAM) 50 MG tablet TAKE 1 TABLET BY MOUTH EVERY 6 HOURS AS NEEDED  60 tablet  0   No current facility-administered medications on file prior to visit.     The PMH, PSH, Social History, Family History, Medications, and allergies have been reviewed in [facility], and have been updated if relevant.   Review of Systems       See HPI   Physical Exam BP 142/80  Pulse 57  Temp(Src) 98 F (36.7 C) (Oral)  Ht 5\' 4"  (1.626 m)  Wt 209 lb 4 oz (94.915 kg)  BMI 35.90 kg/m2  SpO2 98%  General:  Well-developed,well-nourished,in no acute distress; alert,appropriate and cooperative throughout examination Ears:  R ear normal and L ear normal.   Mouth:  Oral mucosa and oropharynx without lesions or exudates.  Teeth in good repair. Lungs:  Normal respiratory effort, chest expands  symmetrically. Lungs are clear to auscultation, no crackles or wheezes. Heart:  Normal rate and regular rhythm. S1 and S2 normal without gallop, murmur, click, rub or other extra sounds. Abdomen:  Bowel sounds positive,abdomen soft and non-tender without masses, organomegaly or hernias noted. Extremities:  No clubbing, cyanosis, edema, or deformity noted with normal full range of motion of all joints.   Skin:  Intact without suspicious lesions or rashes Psych:  Cognition and judgment appear intact. Alert and cooperative with normal attention span and concentration. No apparent delusions, illusions, hallucinations  Assessment and Plan:

## 2013-11-08 NOTE — Progress Notes (Signed)
Pre visit review using our clinic review tool, if applicable. No additional management support is needed unless otherwise documented below in the visit note. 

## 2013-11-08 NOTE — Assessment & Plan Note (Signed)
Off Lunesta and sleeping well.

## 2013-11-09 ENCOUNTER — Encounter: Payer: Self-pay | Admitting: *Deleted

## 2013-11-09 ENCOUNTER — Telehealth: Payer: Self-pay | Admitting: Family Medicine

## 2013-11-09 NOTE — Telephone Encounter (Signed)
Relevant patient education assigned to patient using Emmi. ° °

## 2013-11-12 ENCOUNTER — Ambulatory Visit (INDEPENDENT_AMBULATORY_CARE_PROVIDER_SITE_OTHER)
Admission: RE | Admit: 2013-11-12 | Discharge: 2013-11-12 | Disposition: A | Discharge status: Home / Self Care | Payer: Medicare Other | Source: Ambulatory Visit | Attending: Family Medicine | Admitting: Family Medicine

## 2013-11-12 ENCOUNTER — Ambulatory Visit (INDEPENDENT_AMBULATORY_CARE_PROVIDER_SITE_OTHER): Payer: Medicare Other | Admitting: Family Medicine

## 2013-11-12 ENCOUNTER — Encounter: Payer: Self-pay | Admitting: Family Medicine

## 2013-11-12 VITALS — BP 140/72 | HR 51 | Temp 98.0°F | Ht 64.0 in | Wt 204.5 lb

## 2013-11-12 DIAGNOSIS — M25569 Pain in unspecified knee: Secondary | ICD-10-CM

## 2013-11-12 DIAGNOSIS — IMO0002 Reserved for concepts with insufficient information to code with codable children: Secondary | ICD-10-CM | POA: Diagnosis not present

## 2013-11-12 DIAGNOSIS — M1710 Unilateral primary osteoarthritis, unspecified knee: Secondary | ICD-10-CM | POA: Diagnosis not present

## 2013-11-12 DIAGNOSIS — M5137 Other intervertebral disc degeneration, lumbosacral region: Secondary | ICD-10-CM

## 2013-11-12 DIAGNOSIS — M25561 Pain in right knee: Secondary | ICD-10-CM

## 2013-11-12 DIAGNOSIS — M5416 Radiculopathy, lumbar region: Secondary | ICD-10-CM

## 2013-11-12 MED ORDER — PREGABALIN 75 MG PO CAPS: 75.0000 mg | ORAL_CAPSULE | Freq: Two times a day (BID) | ORAL | Status: DC

## 2013-11-12 NOTE — Progress Notes (Signed)
Date:  11/12/2013   Name:  Heather Shaw   DOB:  06/08/1941   MRN:  [id number]  PCP:  Arnette Norris, MD  Evaluating MD: Owens Loffler, MD  This 73 y.o. female patient presents with "R knee pain" pain intermittently for 15 years - but upon further questioning, the patient actually is having some radiculopathy and paresthesias throughout the entire leg and it is this pain and is altered sensation that she describes as her pain around the knee.  . No audible pop was heard. The patient has not had an effusion. No symptomatic giving-way. No mechanical clicking. Joint has not locked up. Patient has been able to walk. The patient does not have pain going up and down stairs or rising from a seated position.   2001, R knee pain. Initially she was having some problems as far back as 2001. Taking 2 tablets of tramadol at night. Sometimes a vicodin tablet a day.   DDD of L-spine.   2002 - 2003, [facility], back problem. She was seen at [facility], and they told her that she had a problem with her back, did not have any knee pathology.  Then Dr. Shellia Carwin,  Arthroscopy. About 5 years ago the patient had knee arthroscopy on the RIGHT, and Dr. Collier Salina felt on the surgical evaluation of the patient's knee that she had effectively no signs of degenerative joint disease or chondromalacia.  Saw someone at [facility], DDD in the l spine.   Burning goes down her leg. Bone feels like it hurts. Feels irritated and inflammed.   Take Tylenol and no nsaids.  Pain never goes away.   Lumbar radiculopathy and neuropathy  Pain location: anterior, posterior, and lateral, throughout the entirety of the leg down through the lower extremity. Current physical activity: minimal Prior Knee Surgery: RIGHT knee arthroscopy. Current pain meds: Tylenol and tramadol. Intermittent Vicodin. Bracing: none  The PMH, PSH, Social History, Family History, Medications, and allergies have been reviewed in [facility], and have been updated if  relevant.  ROS: no acute illness or fever MSK: above GI: tol po intake without nausea or vomitting. Neuro: no numbness, tingling, or radiculopathy O/w per hpi  PHYSICAL EXAM  Blood pressure 140/72, pulse 51, temperature 98 F (36.7 C), temperature source Oral, height 5\' 4"  (1.626 m), weight 204 lb 8 oz (92.761 kg).  GEN: Well-developed,well-nourished,in no acute distress; alert,appropriate and cooperative throughout examination HEENT: Normocephalic and atraumatic without obvious abnormalities. Ears, externally no deformities PULM: Breathing comfortably in no respiratory distress EXT: No clubbing, cyanosis, or edema PSYCH: Normally interactive. Cooperative during the interview. Pleasant. Friendly and conversant. Not anxious or depressed appearing. Normal, full affect.  R knee Gait: Normal heel toe pattern ROM: WNL Effusion: neg Echymosis or edema: none Patellar tendon NT Painful PLICA: neg Patellar grind: negative Medial and lateral patellar facet loading: negative medial and lateral joint lines:NT Mcmurray's neg Flexion-pinch neg Varus and valgus stress: stable Lachman: neg Ant and Post drawer: neg Hip abduction, IR, ER: WNL Hip flexion str: 5/5 Hip abd: 5/5 Quad: 5/5 VMO atrophy:No Hamstring concentric and eccentric: 5/5  Range of motion at  the waist: Flexion: normal Extension: normal Lateral bending: normal Rotation: all normal  No echymosis or edema Rises to examination table with no difficulty Gait: non antalgic  Inspection/Deformity: N Paraspinus Tenderness: mild l4-s1  B Ankle Dorsiflexion (L5,4): 5/5 B Great Toe Dorsiflexion (L5,4): 5/5 Heel Walk (L5): WNL Toe Walk (S1): WNL Rise/Squat (L4): WNL  SENSORY B Medial Foot (L4): WNL B  Dorsum (L5): WNL B Lateral (S1): WNL Light Touch: WNL Pinprick: WNL  REFLEXES Knee (L4): 2+ Ankle (S1): 2+  B SLR, seated: neg B SLR, supine: neg B FABER: neg B Reverse FABER: neg B Greater Troch: NT B Log  Roll: neg B Stork: NT B Sciatic Notch: NT   Dg Knee Ap/lat W/sunrise Right  11/12/2013   CLINICAL DATA:  Pain.  EXAM: DG KNEE - 3 VIEWS  COMPARISON:  MR EXTREM LOW JT*R* W/O CM dated 05/30/2009; CT EXTREMITY LOWER W/O CM*R* dated 07/01/2004  FINDINGS: Tricompartment degenerative change present. No evidence of fracture or dislocation. Metallic density again noted along the soft tissues of the medial posterior knee.  IMPRESSION: Number Degenerative changes right knee.  2. Metallic density again noted along the medial posterior right knee soft tissues.   Electronically Signed   By: Marcello Moores  Register   On: 11/12/2013 09:41   The radiological images were independently reviewed by myself in the office and results were reviewed with the patient. My independent interpretation of images:  Joint spaces are well preserved. On weightbearing the films, minimal loss of joint space at best. Overall minimal osteoarthritic changes in all compartments relative to age. No fracture or dislocation. There is a radiopaque body medially. This would be most consistent with a metal fragment. Owens Loffler, MD   Assessment and Plan: Lumbar radiculopathy  Knee pain, right - Plan: DG Knee AP/LAT W/Sunrise Right  DDD (degenerative disc disease), lumbosacral  The patient's pain is not coming from her knee. I suspect that this is all lumbar radiculopathy. I would titrate up the patient's Lyrica dosing. Not consistent with intra-articular knee pathology.  New Prescriptions   PREGABALIN (LYRICA) 75 MG CAPSULE    Take 1 capsule (75 mg total) by mouth 2 (two) times daily.    Orders Placed This Encounter  Procedures  . DG Knee AP/LAT W/Sunrise Right   Return in about 6 weeks (around 12/24/2013).   Signed,  Maud Deed. Careen Mauch, MD, [place] [facility] [place] [address] Phone: [phone] Fax: [phone]   Patient's Medications  New Prescriptions   PREGABALIN  (LYRICA) 75 MG CAPSULE    Take 1 capsule (75 mg total) by mouth 2 (two) times daily.  Previous Medications   ACETAMINOPHEN (TYLENOL) 500 MG TABLET    Take 500 mg by mouth as needed.     ATENOLOL (TENORMIN) 25 MG TABLET    Take 1 tablet (25 mg total) by mouth daily. After patient completes bystolic medication change to atenolol, see Dr Mariana Arn office, this is for insurer purposes   HYDROCHLOROTHIAZIDE (HYDRODIURIL) 25 MG TABLET    TAKE 1 TABLET BY MOUTH EVERY DAY   HYDROCODONE-ACETAMINOPHEN (NORCO) 7.5-325 MG PER TABLET    TAKE 1 TABLET BY MOUTH TWICE DAILY AS NEEDED FOR PAIN   LISINOPRIL (PRINIVIL,ZESTRIL) 40 MG TABLET    Take 1 tablet (40 mg total) by mouth daily.   MECLIZINE (ANTIVERT) 25 MG TABLET    Take 25 mg by mouth as needed.   METH-HYO-M BL-NA PHOS-PH SAL (URIBEL PO)    Take by mouth as needed.   Modified Medications   Modified Medication Previous Medication   TRAMADOL (ULTRAM) 50 MG TABLET traMADol (ULTRAM) 50 MG tablet      TAKE 1 TABLET BY MOUTH EVERY 6 HOURS AS NEEDED FOR PAIN    TAKE 1 TABLET BY MOUTH EVERY 6 HOURS AS NEEDED  Discontinued Medications   No medications  on file

## 2013-11-12 NOTE — Progress Notes (Signed)
Pre visit review using our clinic review tool, if applicable. No additional management support is needed unless otherwise documented below in the visit note. 

## 2013-11-13 ENCOUNTER — Other Ambulatory Visit: Payer: Self-pay | Admitting: Family Medicine

## 2013-11-13 NOTE — Telephone Encounter (Signed)
Lm on pts vm informing her Rx has been faxed to requested pharmacy.  

## 2013-11-13 NOTE — Telephone Encounter (Signed)
Pt requesting medication refill. Last ov 10/2013 with no future appts scheduled. Pls advise 

## 2013-11-16 ENCOUNTER — Encounter: Payer: Self-pay | Admitting: Cardiovascular Disease

## 2013-11-16 ENCOUNTER — Ambulatory Visit (INDEPENDENT_AMBULATORY_CARE_PROVIDER_SITE_OTHER): Payer: Medicare Other | Admitting: Cardiovascular Disease

## 2013-11-16 VITALS — BP 142/82 | HR 90 | Ht 64.0 in | Wt 208.0 lb

## 2013-11-16 DIAGNOSIS — I10 Essential (primary) hypertension: Secondary | ICD-10-CM

## 2013-11-16 DIAGNOSIS — E785 Hyperlipidemia, unspecified: Secondary | ICD-10-CM | POA: Diagnosis not present

## 2013-11-16 NOTE — Progress Notes (Signed)
Patient ID: Barnet Pall, female   DOB: 12-04-1940, 73 y.o.   MRN: [id number] 73 yo referred by primary for HTN. Recent worsening. Seen in ER at [facility] and given PRN clonidine which probably not helping fluctuations. She is compliant with her lisinopril and has been on diuretic for years. No unusual stress but has a white coat component. Has an [place] BP cuff and most readings at home are high. Seems real nervous. No history of chronic kidney disease. Some salt in diet. No stimulants or cold meds. Denies ETOH and quit smoking over 25 years ago. No SSCP, palpitations or syncope Her daughters husband died and husband had a ruptured colon so her stress levels have been high  Bystolic not covered anymore and she just changed to atenolol     ROS: Denies fever, malais, weight loss, blurry vision, decreased visual acuity, cough, sputum, SOB, hemoptysis, pleuritic pain, palpitaitons, heartburn, abdominal pain, melena, lower extremity edema, claudication, or rash.  All other systems reviewed and negative  General: Affect appropriate Healthy:  appears stated age 73: normal Neck supple with no adenopathy JVP normal no bruits no thyromegaly Lungs clear with no wheezing and good diaphragmatic motion Heart:  S1/S2 no murmur, no rub, gallop or click PMI normal Abdomen: benighn, BS positve, no tenderness, no AAA no bruit.  No HSM or HJR Distal pulses intact with no bruits No edema Neuro non-focal Skin warm and dry No muscular weakness   Current Outpatient Prescriptions  Medication Sig Dispense Refill  . acetaminophen (TYLENOL) 500 MG tablet Take 500 mg by mouth as needed.        Marland Kitchen atenolol (TENORMIN) 25 MG tablet Take 1 tablet (25 mg total) by mouth daily. After patient completes bystolic medication change to atenolol, see Dr Mariana Arn office, this is for insurer purposes  30 tablet  11  . hydrochlorothiazide (HYDRODIURIL) 25 MG tablet TAKE 1 TABLET BY MOUTH EVERY DAY  90 tablet  1  .  HYDROcodone-acetaminophen (NORCO) 7.5-325 MG per tablet TAKE 1 TABLET BY MOUTH TWICE DAILY AS NEEDED FOR PAIN  60 tablet  0  . lisinopril (PRINIVIL,ZESTRIL) 40 MG tablet Take 1 tablet (40 mg total) by mouth daily.  30 tablet  0  . meclizine (ANTIVERT) 25 MG tablet Take 25 mg by mouth as needed.      . Meth-Hyo-M Bl-Na Phos-Ph Sal (URIBEL PO) Take by mouth as needed.       . pregabalin (LYRICA) 75 MG capsule Take 1 capsule (75 mg total) by mouth 2 (two) times daily.  60 capsule  5  . traMADol (ULTRAM) 50 MG tablet TAKE 1 TABLET BY MOUTH EVERY 6 HOURS AS NEEDED FOR PAIN  60 tablet  0   No current facility-administered medications for this visit.    Allergies  Codeine; Prednisone; and Sulfonamide derivatives  Electrocardiogram: 08/18/12 SR rate 68 LAD nonspecific ST changes today NSR ate 90 normal ECG   Assessment and Plan

## 2013-11-16 NOTE — Assessment & Plan Note (Signed)
Cholesterol is at goal.  Continue current dose of statin and diet Rx.  No myalgias or side effects.  F/U  LFT's in 6 months. Lab Results  Component Value Date   LDLCALC 122* 11/08/2013

## 2013-11-16 NOTE — Assessment & Plan Note (Signed)
Reasonable control of BP  HR a bit high and may need higher dose of atenolol  She will monitor at home Continue low sodium diet and exercise

## 2013-11-16 NOTE — Patient Instructions (Signed)
Your physician wants you to follow-up in: YEAR WITH DR NISHAN  You will receive a reminder letter in the mail two months in advance. If you don't receive a letter, please call our office to schedule the follow-up appointment.  Your physician recommends that you continue on your current medications as directed. Please refer to the Current Medication list given to you today. 

## 2013-11-27 ENCOUNTER — Encounter: Payer: Self-pay | Admitting: Internal Medicine

## 2013-11-27 ENCOUNTER — Ambulatory Visit (INDEPENDENT_AMBULATORY_CARE_PROVIDER_SITE_OTHER): Payer: Medicare Other | Admitting: Internal Medicine

## 2013-11-27 VITALS — BP 134/84 | HR 74 | Temp 98.5°F | Wt 207.8 lb

## 2013-11-27 DIAGNOSIS — J209 Acute bronchitis, unspecified: Secondary | ICD-10-CM | POA: Diagnosis not present

## 2013-11-27 DIAGNOSIS — J019 Acute sinusitis, unspecified: Secondary | ICD-10-CM | POA: Diagnosis not present

## 2013-11-27 MED ORDER — AMOXICILLIN-POT CLAVULANATE 875-125 MG PO TABS: 1.0000 | ORAL_TABLET | Freq: Two times a day (BID) | ORAL | Status: DC

## 2013-11-27 NOTE — Patient Instructions (Addendum)

## 2013-11-27 NOTE — Progress Notes (Signed)
Pre visit review using our clinic review tool, if applicable. No additional management support is needed unless otherwise documented below in the visit note. 

## 2013-11-27 NOTE — Progress Notes (Signed)
HPI  Pt presents to the clinic today with c/o facial pain and pressure, headache, cough and shortness of breath. She reports this started 2-3 weeks ago. She does have yellow nasal drainage. She denies fever, chills or body aches. She has tried saline nasal spray. She does not take a antihistamine OTC daily. She has no history of allergies or breathing problems. She has had a sinus infections in the past.  Review of Systems    Past Medical History  Diagnosis Date  . Hyperlipidemia   . Hypertension   . Vertigo   . H/O: knee surgery     right knee meniscal tear  . VITAMIN D DEFICIENCY   . VERTIGO   . Routine general medical examination at a health care facility   . Radiculopathy, lumbar region   . Other acute sinusitis   . INSOMNIA   . Dysuria   . DDD (degenerative disc disease), lumbosacral   . CERUMEN IMPACTION, RIGHT     Family History  Problem Relation Age of Onset  . COPD Father     History   Social History  . Marital Status: Married    Spouse Name: N/A    Number of Children: N/A  . Years of Education: N/A   Occupational History  . Not on file.   Social History Main Topics  . Smoking status: Former Research scientist (life sciences)  . Smokeless tobacco: Never Used  . Alcohol Use: No  . Drug Use: No  . Sexual Activity: Not on file   Other Topics Concern  . Not on file   Social History Narrative  . No narrative on file    Allergies  Allergen Reactions  . Codeine   . Prednisone     Causes elevation in blood pressure- ? All steroids cause same reaction  . Sulfonamide Derivatives      Constitutional: Positive headache, fatigue. Denies fever or abrupt weight changes.  HEENT:  Positive eye pain, pressure behind the eyes, facial pain, nasal congestion and sore throat. Denies eye redness, ear pain, ringing in the ears, wax buildup, runny nose or bloody nose. Respiratory: Positive cough and shortness of breath. Denies difficulty breathing.  Cardiovascular: Denies chest pain, chest  tightness, palpitations or swelling in the hands or feet.   No other specific complaints in a complete review of systems (except as listed in HPI above).  Objective:    General: Appears his stated age, well developed, well nourished in NAD. HEENT: Head: normal shape and size, maxillary sinus tenderness noted; Eyes: sclera white, no icterus, conjunctiva pink, PERRLA and EOMs intact; Ears: Tm's gray and intact, normal light reflex; Nose: mucosa pink and moist, septum midline; Throat/Mouth: + PND. Teeth present, mucosa pink and moist, no exudate noted, no lesions or ulcerations noted.  Neck: Neck supple, trachea midline. No massses, lumps or thyromegaly present.  Cardiovascular: Normal rate and rhythm. S1,S2 noted.  No murmur, rubs or gallops noted. No JVD or BLE edema. No carotid bruits noted. Pulmonary/Chest: Normal effort and scattered rhonchi throughout. No respiratory distress. No wheezes, rales noted.      Assessment & Plan:   Acute bacterial sinusitis, bronchitis  Can use a Neti Pot which can be purchased from your local drug store. Augmentin BID for 10 days  RTC as needed or if symptoms persist.

## 2013-11-30 ENCOUNTER — Other Ambulatory Visit: Payer: Self-pay | Admitting: Internal Medicine

## 2013-11-30 ENCOUNTER — Telehealth: Payer: Self-pay

## 2013-11-30 MED ORDER — AMOXICILLIN 500 MG PO CAPS: 500.0000 mg | ORAL_CAPSULE | Freq: Three times a day (TID) | ORAL | Status: DC

## 2013-11-30 NOTE — Telephone Encounter (Signed)
Amoxil sent to pharmacy

## 2013-11-30 NOTE — Telephone Encounter (Signed)
Pt left v/m; pt seen 11/27/13 and was prescribed augmentin; augmentin is causing pain in stomach and pt request plain amoxicillin sent to [place]. Pt request cb.

## 2013-12-03 ENCOUNTER — Other Ambulatory Visit: Payer: Self-pay | Admitting: *Deleted

## 2013-12-03 MED ORDER — LISINOPRIL 40 MG PO TABS: 40.0000 mg | ORAL_TABLET | Freq: Every day | ORAL | Status: DC

## 2013-12-03 NOTE — Telephone Encounter (Signed)
Pt is aware and she has already picked up new Rx

## 2013-12-18 ENCOUNTER — Other Ambulatory Visit: Payer: Self-pay

## 2013-12-18 NOTE — Telephone Encounter (Signed)
Pt left v/m requesting rx hydrocodone apap. Call when ready for pick up.  

## 2013-12-18 NOTE — Telephone Encounter (Signed)
Ok to print and put in my box for signature. 

## 2013-12-19 MED ORDER — HYDROCODONE-ACETAMINOPHEN 7.5-325 MG PO TABS: ORAL_TABLET | ORAL | Status: DC

## 2013-12-19 NOTE — Telephone Encounter (Signed)
Spoke to pt and informed her Rx is available for pickup; informed a gov't issued photo id required for pickup 

## 2013-12-21 ENCOUNTER — Other Ambulatory Visit: Payer: Self-pay | Admitting: Family Medicine

## 2013-12-21 NOTE — Telephone Encounter (Signed)
Lm on pts vm informing her Rx has been called in to requested pharmacy 

## 2013-12-21 NOTE — Telephone Encounter (Signed)
Pt requesting medication refill. Last f/u appt 10/2013 with no future appts scheduled. pls advise 

## 2013-12-24 ENCOUNTER — Ambulatory Visit: Payer: Medicare Other | Admitting: Family Medicine

## 2013-12-31 ENCOUNTER — Encounter (INDEPENDENT_AMBULATORY_CARE_PROVIDER_SITE_OTHER): Payer: Self-pay

## 2013-12-31 ENCOUNTER — Encounter: Payer: Self-pay | Admitting: Family Medicine

## 2013-12-31 ENCOUNTER — Ambulatory Visit (INDEPENDENT_AMBULATORY_CARE_PROVIDER_SITE_OTHER): Payer: Medicare Other | Admitting: Family Medicine

## 2013-12-31 VITALS — BP 140/68 | HR 53 | Temp 99.2°F | Ht 64.0 in | Wt 207.5 lb

## 2013-12-31 DIAGNOSIS — IMO0002 Reserved for concepts with insufficient information to code with codable children: Secondary | ICD-10-CM | POA: Diagnosis not present

## 2013-12-31 DIAGNOSIS — M5416 Radiculopathy, lumbar region: Secondary | ICD-10-CM | POA: Insufficient documentation

## 2013-12-31 DIAGNOSIS — M5137 Other intervertebral disc degeneration, lumbosacral region: Secondary | ICD-10-CM

## 2013-12-31 MED ORDER — GABAPENTIN 300 MG PO CAPS: ORAL_CAPSULE | ORAL | Status: DC

## 2013-12-31 NOTE — Progress Notes (Signed)
Date:  12/31/2013   Name:  Heather Shaw   DOB:  10/11/1940   MRN:  [id number]  PCP:  Arnette Norris, MD  Evaluating MD: Owens Loffler, MD  Fu ov: last consult  [facility].  Insurance denied -   The details of our last consults are noted below. The patient has had 15-20 years of right knee pain and right-sided radiculopathy and back pain. Now she is primarily having pain that is going down the posterior aspect of her right leg. She is also have some altered sensation. On her last office visit, I recommended that she started some Lyrica. The patient did not complete this, and had some financial limitation. She in the past has been unable to do any kind of Neurontin. She has never tried to try cyclic antidepressants or Cymbalta or other SNRIs.  Pain never goes away.  Taking vicodin 7.5 mg, once at night. Sometimes twice, then some tramadol at night.   ESI - had one about 6 years ago. One of the [facility] in [place].  BP has gone up from steroids.   She has had extensive evaluations by multiple physicians over the last 2 decades.  11/12/2013 Last OV with Owens Loffler, MD  This 73 y.o. female patient presents with "R knee pain" pain intermittently for 15 years - but upon further questioning, the patient actually is having some radiculopathy and paresthesias throughout the entire leg and it is this pain and is altered sensation that she describes as her pain around the knee.  . No audible pop was heard. The patient has not had an effusion. No symptomatic giving-way. No mechanical clicking. Joint has not locked up. Patient has been able to walk. The patient does not have pain going up and down stairs or rising from a seated position.   2001, R knee pain. Initially she was having some problems as far back as 2001. Taking 2 tablets of tramadol at night. Sometimes a vicodin tablet a day.   DDD of L-spine.   2002 - 2003, [facility], back problem. She was seen at [facility], and they told her that she had  a problem with her back, did not have any knee pathology.  Then Dr. Shellia Carwin,  Arthroscopy. About 5 years ago the patient had knee arthroscopy on the RIGHT, and Dr. Collier Salina felt on the surgical evaluation of the patient's knee that she had effectively no signs of degenerative joint disease or chondromalacia.  Saw someone at [facility], DDD in the l spine.   Burning goes down her leg. Bone feels like it hurts. Feels irritated and inflammed.   Take Tylenol and no nsaids.  Pain never goes away.   Lumbar radiculopathy and neuropathy  Pain location: anterior, posterior, and lateral, throughout the entirety of the leg down through the lower extremity. Current physical activity: minimal Prior Knee Surgery: RIGHT knee arthroscopy. Current pain meds: Tylenol and tramadol. Intermittent Vicodin. Bracing: none  The PMH, PSH, Social History, Family History, Medications, and allergies have been reviewed in [facility], and have been updated if relevant.  ROS: no acute illness or fever MSK: above GI: tol po intake without nausea or vomitting. Neuro: no numbness, tingling, or radiculopathy O/w per hpi  PHYSICAL EXAM  Blood pressure 140/68, pulse 53, temperature 99.2 F (37.3 C), temperature source Oral, height 5\' 4"  (1.626 m), weight 207 lb 8 oz (94.121 kg).  GEN: Well-developed,well-nourished,in no acute distress; alert,appropriate and cooperative throughout examination HEENT: Normocephalic and atraumatic without obvious abnormalities. Ears, externally  no deformities PULM: Breathing comfortably in no respiratory distress EXT: No clubbing, cyanosis, or edema PSYCH: Normally interactive. Cooperative during the interview. Pleasant. Friendly and conversant. Not anxious or depressed appearing. Normal, full affect.  Range of motion at  the waist: Flexion: normal Extension: normal Lateral bending: normal Rotation: all normal  No echymosis or edema Rises to examination table with no difficulty Gait: non  antalgic  Inspection/Deformity: N Paraspinus Tenderness: mild l4-s1  B Ankle Dorsiflexion (L5,4): 5/5 B Great Toe Dorsiflexion (L5,4): 5/5 Heel Walk (L5): WNL Toe Walk (S1): WNL Rise/Squat (L4): WNL  SENSORY B Medial Foot (L4): WNL B Dorsum (L5): WNL B Lateral (S1): WNL Light Touch: WNL Pinprick: WNL  REFLEXES Knee (L4): 2+ Ankle (S1): 2+  B SLR, seated: neg B SLR, supine: neg B FABER: neg B Reverse FABER: neg B Greater Troch: NT B Log Roll: neg B Stork: NT B Sciatic Notch: NT   Assessment and Plan: Lumbar radiculopathy, chronic  DDD (degenerative disc disease), lumbosacral  The patient's pain is not coming from her knee. I suspect that this is all lumbar radiculopathy. Not consistent with intra-articular knee pathology.  I have given her a very detailed titration protocol for titrating up her gabapentin. She was anxious and reluctant to start any other medications or any that have been previously described as antidepressant.  New Prescriptions   GABAPENTIN (NEURONTIN) 300 MG CAPSULE    Use dose schedule as directed.   Patient Instructions  Generic Gabapentin Titration Schedule  Generic Gabapentin (generic form of Neurontin) comes in 300 mg tablets or capsules.   You have to titrate your dose slowly to reduce side effects and reduce sedation / sleepiness.    Week               Breakfast  Lunch   Dinner One                 0   0   300 mg Two   300mg    0   300mg  Three  300mg    300mg    300mg  Four   300mg    300mg    600mg  (2 tabs) Five   600mg  (2 tabs) 300mg    600mg  (2 tabs) Six   600mg  (2 tabs) 600mg  (2 tabs) 600mg  (2 tabs) Seven  600mg  (2 tabs) 600mg  (2 tabs) 900mg  (3 tabs) Eight   900mg  (3 tabs) 600mg  (2 tabs) 900mg  (3 tabs)  If you have any problems at any time, drop back to the previous dosing schedule. Continue with this dose for 1 week, and then try to go up the next step again.       Signed,  Maud Deed. Marvion Bastidas, MD, [place] at [facility] [place] [address] Phone: [phone] Fax: [phone]   Patient's Medications  New Prescriptions   GABAPENTIN (NEURONTIN) 300 MG CAPSULE    Use dose schedule as directed.  Previous Medications   ACETAMINOPHEN (TYLENOL) 500 MG TABLET    Take 500 mg by mouth as needed.     ATENOLOL (TENORMIN) 25 MG TABLET    Take 1 tablet (25 mg total) by mouth daily. After patient completes bystolic medication change to atenolol, see Dr Mariana Arn office, this is for insurer purposes   HYDROCHLOROTHIAZIDE (HYDRODIURIL) 25 MG TABLET    TAKE 1 TABLET BY MOUTH EVERY DAY   HYDROCODONE-ACETAMINOPHEN ([place]) 7.5-325 MG PER TABLET    TAKE 1 TABLET BY MOUTH TWICE DAILY AS NEEDED FOR  PAIN   LISINOPRIL (PRINIVIL,ZESTRIL) 40 MG TABLET    Take 1 tablet (40 mg total) by mouth daily.   MECLIZINE (ANTIVERT) 25 MG TABLET    Take 25 mg by mouth as needed.   METH-HYO-M BL-NA PHOS-PH SAL (URIBEL PO)    Take by mouth as needed.    TRAMADOL (ULTRAM) 50 MG TABLET    TAKE 1 TABLET BY MOUTH EVERY 6 HOURS AS NEEDED FOR PAIN  Modified Medications   No medications on file  Discontinued Medications   AMOXICILLIN (AMOXIL) 500 MG CAPSULE    Take 1 capsule (500 mg total) by mouth 3 (three) times daily.   AMOXICILLIN-CLAVULANATE (AUGMENTIN) 875-125 MG PER TABLET    Take 1 tablet by mouth 2 (two) times daily.

## 2013-12-31 NOTE — Patient Instructions (Signed)

## 2013-12-31 NOTE — Progress Notes (Signed)
Pre visit review using our clinic review tool, if applicable. No additional management support is needed unless otherwise documented below in the visit note. 

## 2014-01-09 ENCOUNTER — Other Ambulatory Visit: Payer: Self-pay | Admitting: Family Medicine

## 2014-01-28 ENCOUNTER — Other Ambulatory Visit: Payer: Self-pay | Admitting: Family Medicine

## 2014-01-28 NOTE — Telephone Encounter (Signed)
Pt requesting medication refill. Last f/u appt 10/2013 with no future appts scheduled. pls advise

## 2014-01-29 NOTE — Telephone Encounter (Signed)
Too early to contact pt; Rx has been called in to requested pharmacy

## 2014-01-31 ENCOUNTER — Telehealth: Payer: Self-pay

## 2014-01-31 NOTE — Telephone Encounter (Signed)
Pt left v/m requesting rx hydrocodone apap. Call when ready for pick up.  

## 2014-02-01 MED ORDER — HYDROCODONE-ACETAMINOPHEN 7.5-325 MG PO TABS: ORAL_TABLET | ORAL | Status: DC

## 2014-02-01 NOTE — Telephone Encounter (Signed)
Spoke with pt and pt thinks she has enough med to last until 02/04/14.

## 2014-02-01 NOTE — Telephone Encounter (Signed)
Ok to print out and put in my box for signature. 

## 2014-02-01 NOTE — Telephone Encounter (Signed)
Patient with 20 year chronic pain syndrome history. I have seen in consultation in past, but I do not assume chronic narcotic management with these cases.   I will forward to her PCP, who she has a pain contract with.  Electronically Signed  By: Owens Loffler, MD On: 02/01/2014 7:48 AM

## 2014-02-04 ENCOUNTER — Telehealth: Payer: Self-pay

## 2014-02-04 NOTE — Telephone Encounter (Signed)
Pt left v/m requesting status of Hydrocodone; pt request cb [phone].

## 2014-02-04 NOTE — Telephone Encounter (Signed)
Patient returned your call.

## 2014-02-04 NOTE — Telephone Encounter (Signed)
Left message on answering machine to call back.

## 2014-02-04 NOTE — Telephone Encounter (Signed)
Opened in error; see 01/31/14 phone note.

## 2014-02-04 NOTE — Telephone Encounter (Signed)
Patient notified that script is up front ready for pickup.

## 2014-03-04 ENCOUNTER — Ambulatory Visit: Payer: Medicare Other | Admitting: Family Medicine

## 2014-03-06 ENCOUNTER — Ambulatory Visit (INDEPENDENT_AMBULATORY_CARE_PROVIDER_SITE_OTHER): Payer: Medicare Other | Admitting: Internal Medicine

## 2014-03-06 ENCOUNTER — Encounter: Payer: Self-pay | Admitting: Internal Medicine

## 2014-03-06 VITALS — BP 120/80 | HR 58 | Temp 98.2°F | Wt 203.8 lb

## 2014-03-06 DIAGNOSIS — J069 Acute upper respiratory infection, unspecified: Secondary | ICD-10-CM | POA: Diagnosis not present

## 2014-03-06 DIAGNOSIS — B9789 Other viral agents as the cause of diseases classified elsewhere: Principal | ICD-10-CM

## 2014-03-06 MED ORDER — PROMETHAZINE-DM 6.25-15 MG/5ML PO SYRP: 5.0000 mL | ORAL_SOLUTION | Freq: Four times a day (QID) | ORAL | Status: DC | PRN

## 2014-03-06 NOTE — Progress Notes (Signed)
HPI  Pt presents to the clinic today with c/o sore throat, post nasal drip, cough and chest congestion. This started 5 days ago. She reports the cough is worse at night. The cough is productive of thick white sputum. She denies fever, chills or body aches. She has tried tylenol and nasal saline without much relief. She does have a history of allergies but denies breathing problems. She has not had sick contacts that she is aware of.   Review of Systems      Past Medical History  Diagnosis Date  . Hyperlipidemia   . Hypertension   . Vertigo   . H/O: knee surgery     right knee meniscal tear  . VITAMIN D DEFICIENCY   . VERTIGO   . Routine general medical examination at a health care facility   . Radiculopathy, lumbar region   . Other acute sinusitis   . INSOMNIA   . Dysuria   . DDD (degenerative disc disease), lumbosacral   . CERUMEN IMPACTION, RIGHT     Family History  Problem Relation Age of Onset  . COPD Father     History   Social History  . Marital Status: Married    Spouse Name: N/A    Number of Children: N/A  . Years of Education: N/A   Occupational History  . Not on file.   Social History Main Topics  . Smoking status: Former Research scientist (life sciences)  . Smokeless tobacco: Never Used  . Alcohol Use: No  . Drug Use: No  . Sexual Activity: Not on file   Other Topics Concern  . Not on file   Social History Narrative  . No narrative on file    Allergies  Allergen Reactions  . Codeine   . Prednisone     Causes elevation in blood pressure- ? All steroids cause same reaction  . Sulfonamide Derivatives      Constitutional: Denies headache, fatigue, fever or  abrupt weight changes.  HEENT:  Positive sore throat. Denies eye redness, eye pain, pressure behind the eyes, facial pain, nasal congestion, ear pain, ringing in the ears, wax buildup, runny nose or bloody nose. Respiratory: Positive cough. Denies difficulty breathing or shortness of breath.  Cardiovascular: Denies  chest pain, chest tightness, palpitations or swelling in the hands or feet.   No other specific complaints in a complete review of systems (except as listed in HPI above).  Objective:   Wt 203 lb 12 oz (92.42 kg) Wt Readings from Last 3 Encounters:  03/06/14 203 lb 12 oz (92.42 kg)  12/31/13 207 lb 8 oz (94.121 kg)  11/27/13 207 lb 12 oz (94.235 kg)   BP 120/80  Pulse 58  Temp(Src) 98.2 F (36.8 C) (Oral)  Wt 203 lb 12 oz (92.42 kg)  SpO2 98%   General: Appears her stated age, well developed, well nourished in NAD. HEENT: Head: normal shape and size; Nose: mucosa pink and moist, septum midline; Throat/Mouth: + PND. Teeth present, mucosa erythematous and moist, no exudate noted, no lesions or ulcerations noted.  Cardiovascular: Normal rate and rhythm. S1,S2 noted.  No murmur, rubs or gallops noted. No JVD or BLE edema. No carotid bruits noted. Pulmonary/Chest: Normal effort and positive vesicular breath sounds. No respiratory distress. No wheezes, rales or ronchi noted.      Assessment & Plan:   Viral Upper Respiratory Infection:  Get some rest and drink plenty of water Do salt water gargles for the sore throat Try some Allegra OTC eRx for  promethazine with dextromethorphan for cough  RTC as needed or if symptoms persist.

## 2014-03-06 NOTE — Patient Instructions (Addendum)
Upper Respiratory Infection, Adult An upper respiratory infection (URI) is also sometimes known as the common cold. The upper respiratory tract includes the nose, sinuses, throat, trachea, and bronchi. Bronchi are the airways leading to the lungs. Most people improve within 1 week, but symptoms can last up to 2 weeks. A residual cough may last even longer.  CAUSES Many different viruses can infect the tissues lining the upper respiratory tract. The tissues become irritated and inflamed and often become very moist. Mucus production is also common. A cold is contagious. You can easily spread the virus to others by oral contact. This includes kissing, sharing a glass, coughing, or sneezing. Touching your mouth or nose and then touching a surface, which is then touched by another person, can also spread the virus. SYMPTOMS  Symptoms typically develop 1 to 3 days after you come in contact with a cold virus. Symptoms vary from person to person. They may include:  Runny nose.  Sneezing.  Nasal congestion.  Sinus irritation.  Sore throat.  Loss of voice (laryngitis).  Cough.  Fatigue.  Muscle aches.  Loss of appetite.  Headache.  Low-grade fever. DIAGNOSIS  You might diagnose your own cold based on familiar symptoms, since most people get a cold 2 to 3 times a year. Your caregiver can confirm this based on your exam. Most importantly, your caregiver can check that your symptoms are not due to another disease such as strep throat, sinusitis, pneumonia, asthma, or epiglottitis. Blood tests, throat tests, and X-rays are not necessary to diagnose a common cold, but they may sometimes be helpful in excluding other more serious diseases. Your caregiver will decide if any further tests are required. RISKS AND COMPLICATIONS  You may be at risk for a more severe case of the common cold if you smoke cigarettes, have chronic heart disease (such as heart failure) or lung disease (such as asthma), or if  you have a weakened immune system. The very young and very old are also at risk for more serious infections. Bacterial sinusitis, middle ear infections, and bacterial pneumonia can complicate the common cold. The common cold can worsen asthma and chronic obstructive pulmonary disease (COPD). Sometimes, these complications can require emergency medical care and may be life-threatening. PREVENTION  The best way to protect against getting a cold is to practice good hygiene. Avoid oral or hand contact with people with cold symptoms. Wash your hands often if contact occurs. There is no clear evidence that vitamin C, vitamin E, echinacea, or exercise reduces the chance of developing a cold. However, it is always recommended to get plenty of rest and practice good nutrition. TREATMENT  Treatment is directed at relieving symptoms. There is no cure. Antibiotics are not effective, because the infection is caused by a virus, not by bacteria. Treatment may include:  Increased fluid intake. Sports drinks offer valuable electrolytes, sugars, and fluids.  Breathing heated mist or steam (vaporizer or shower).  Eating chicken soup or other clear broths, and maintaining good nutrition.  Getting plenty of rest.  Using gargles or lozenges for comfort.  Controlling fevers with ibuprofen or acetaminophen as directed by your caregiver.  Increasing usage of your inhaler if you have asthma. Zinc gel and zinc lozenges, taken in the first 24 hours of the common cold, can shorten the duration and lessen the severity of symptoms. Pain medicines may help with fever, muscle aches, and throat pain. A variety of non-prescription medicines are available to treat congestion and runny nose. Your caregiver   can make recommendations and may suggest nasal or lung inhalers for other symptoms.  HOME CARE INSTRUCTIONS   Only take over-the-counter or prescription medicines for pain, discomfort, or fever as directed by your  caregiver.  Use a warm mist humidifier or inhale steam from a shower to increase air moisture. This may keep secretions moist and make it easier to breathe.  Drink enough water and fluids to keep your urine clear or pale yellow.  Rest as needed.  Return to work when your temperature has returned to normal or as your caregiver advises. You may need to stay home longer to avoid infecting others. You can also use a face mask and careful hand washing to prevent spread of the virus. SEEK MEDICAL CARE IF:   After the first few days, you feel you are getting worse rather than better.  You need your caregiver's advice about medicines to control symptoms.  You develop chills, worsening shortness of breath, or brown or red sputum. These may be signs of pneumonia.  You develop yellow or brown nasal discharge or pain in the face, especially when you bend forward. These may be signs of sinusitis.  You develop a fever, swollen neck glands, pain with swallowing, or white areas in the back of your throat. These may be signs of strep throat. SEEK IMMEDIATE MEDICAL CARE IF:   You have a fever.  You develop severe or persistent headache, ear pain, sinus pain, or chest pain.  You develop wheezing, a prolonged cough, cough up blood, or have a change in your usual mucus (if you have chronic lung disease).  You develop sore muscles or a stiff neck. Document Released: 01/05/2001 Document Revised: 10/04/2011 Document Reviewed: 10/17/2013 Exit[employer] Patient Information 2015 [employer]. This information is not intended to replace advice given to you by your health care provider. Make sure you discuss any questions you have with your health care provider.  

## 2014-03-06 NOTE — Progress Notes (Signed)
Pre visit review using our clinic review tool, if applicable. No additional management support is needed unless otherwise documented below in the visit note. 

## 2014-03-08 ENCOUNTER — Other Ambulatory Visit: Payer: Self-pay | Admitting: Internal Medicine

## 2014-03-08 ENCOUNTER — Telehealth: Payer: Self-pay

## 2014-03-08 MED ORDER — AMOXICILLIN 875 MG PO TABS: 875.0000 mg | ORAL_TABLET | Freq: Two times a day (BID) | ORAL | Status: DC

## 2014-03-08 NOTE — Telephone Encounter (Signed)
p left v/m requesting amoxicillin sent to [facility]; pt was seen on 03/06/14; bronchial tubes are tightening, cough is no better and has low grade fever. Pt said cannot take allegra because causes pt to be nervous.

## 2014-03-08 NOTE — Telephone Encounter (Signed)
Amoxil sent to pharmacy

## 2014-03-11 NOTE — Telephone Encounter (Signed)
Spoke to pt and she picked up Rx on Fri

## 2014-03-12 ENCOUNTER — Other Ambulatory Visit: Payer: Self-pay

## 2014-03-12 NOTE — Telephone Encounter (Signed)
Pt left v/m requesting rx hydrocodone apap. Call when ready for pick up. Pt has appt to see Dr Lorelei Pont on 03/13/14 at 2:15 and would like to pick up rx then.

## 2014-03-13 ENCOUNTER — Ambulatory Visit (INDEPENDENT_AMBULATORY_CARE_PROVIDER_SITE_OTHER): Payer: Medicare Other | Admitting: Family Medicine

## 2014-03-13 ENCOUNTER — Encounter: Payer: Self-pay | Admitting: Family Medicine

## 2014-03-13 VITALS — BP 130/70 | HR 59 | Temp 98.4°F | Ht 64.0 in | Wt 202.8 lb

## 2014-03-13 DIAGNOSIS — IMO0002 Reserved for concepts with insufficient information to code with codable children: Secondary | ICD-10-CM

## 2014-03-13 DIAGNOSIS — M5416 Radiculopathy, lumbar region: Secondary | ICD-10-CM

## 2014-03-13 DIAGNOSIS — M5137 Other intervertebral disc degeneration, lumbosacral region: Secondary | ICD-10-CM

## 2014-03-13 DIAGNOSIS — Z79899 Other long term (current) drug therapy: Secondary | ICD-10-CM | POA: Diagnosis not present

## 2014-03-13 MED ORDER — HYDROCODONE-ACETAMINOPHEN 7.5-325 MG PO TABS: ORAL_TABLET | ORAL | Status: DC

## 2014-03-13 MED ORDER — FENTANYL 12 MCG/HR TD PT72: 12.5000 ug | MEDICATED_PATCH | TRANSDERMAL | Status: DC

## 2014-03-13 NOTE — Progress Notes (Signed)
Dr. Frederico Hamman T. Kealie Barrie, MD Primary Care and Sports Medicine [place], [address] Phone: [phone] Fax: [phone]  03/13/2014  Patient: Heather Shaw [place], MRN: [id number], DOB: Jan 07, 1941  Primary Physician:  Arnette Norris, MD  Chief Complaint: Follow-up   Chronic back pain patient with failure to improve despite multiple conservative measures for more than 2 decades. She continues to have some difficulties, but she has had some relief of her symptoms and taking some gabapentin. Right now she has been able to increase this up to 300 mg 3 times daily. She is stalled out in terms of some side effects and some fatigue at this point. She also is taking some Vicodin about twice a day. Intermittently she also takes some Tylenol.  She has had one episode of epidural steroid in the past, now much significant relief. She has never had a spine surgery, and she is unwilling to try TCA's or SNRIs at this point.  12/31/2013 Last OV with Heather Loffler, MD  Fu ov: last consult  [facility].  Insurance denied -   The details of our last consults are noted below. The patient has had 15-20 years of right knee pain and right-sided radiculopathy and back pain. Now she is primarily having pain that is going down the posterior aspect of her right leg. She is also have some altered sensation. On her last office visit, I recommended that she started some Lyrica. The patient did not complete this, and had some financial limitation. She in the past has been unable to do any kind of Neurontin. She has never tried to try cyclic antidepressants or Cymbalta or other SNRIs.  Pain never goes away.  Taking vicodin 7.5 mg, once at night. Sometimes twice, then some tramadol at night.   ESI - had one about 6 years ago. One of the [place] in [place].  BP has gone up from steroids.   She has had extensive evaluations by multiple physicians over the last 2 decades.  11/12/2013 Last OV with Heather Loffler, MD  This 73 y.o. female patient presents with "R knee pain" pain intermittently for 15 years - but upon further questioning, the patient actually is having some radiculopathy and paresthesias throughout the entire leg and it is this pain and is altered sensation that she describes as her pain around the knee.  . No audible pop was heard. The patient has not had an effusion. No symptomatic giving-way. No mechanical clicking. Joint has not locked up. Patient has been able to walk. The patient does not have pain going up and down stairs or rising from a seated position.   2001, R knee pain. Initially she was having some problems as far back as 2001. Taking 2 tablets of tramadol at night. Sometimes a vicodin tablet a day.   DDD of L-spine.   2002 - 2003, [facility], back problem. She was seen at [facility], and they told her that she had a problem with her back, did not have any knee pathology.  Then Dr. Shellia Carwin,  Arthroscopy. About 5 years ago the patient had knee arthroscopy on the RIGHT, and Dr. Collier Salina felt on the surgical evaluation of the patient's knee that she had effectively no signs of degenerative joint disease or chondromalacia.  Saw someone at [facility], DDD in the l spine.   Burning goes down her leg. Bone feels like it hurts. Feels irritated and inflammed.   Take Tylenol and no nsaids.  Pain never goes away.  Lumbar radiculopathy and neuropathy  Pain location: anterior, posterior, and lateral, throughout the entirety of the leg down through the lower extremity. Current physical activity: minimal Prior Knee Surgery: RIGHT knee arthroscopy. Current pain meds: Tylenol and tramadol. Intermittent Vicodin. Bracing: none  The PMH, PSH, Social History, Family History, Medications, and allergies have been reviewed in [facility], and have been updated if relevant.   ROS: no acute illness or fever MSK: above GI: tol po intake without nausea or vomitting. Neuro: no numbness, tingling, or  radiculopathy O/w per hpi  PHYSICAL EXAM  Blood pressure 130/70, pulse 59, temperature 98.4 F (36.9 C), temperature source Oral, height 5\' 4"  (1.626 m), weight 202 lb 12 oz (91.967 kg), SpO2 97.00%.  GEN: Well-developed,well-nourished,in no acute distress; alert,appropriate and cooperative throughout examination HEENT: Normocephalic and atraumatic without obvious abnormalities. Ears, externally no deformities PULM: Breathing comfortably in no respiratory distress EXT: No clubbing, cyanosis, or edema PSYCH: Normally interactive. Cooperative during the interview. Pleasant. Friendly and conversant. Not anxious or depressed appearing. Normal, full affect.  Range of motion at  the waist: Flexion: normal Extension: normal Lateral bending: normal Rotation: all normal  No echymosis or edema Rises to examination table with no difficulty Gait: non antalgic  Inspection/Deformity: N Paraspinus Tenderness: mild l4-s1  B Ankle Dorsiflexion (L5,4): 5/5 B Great Toe Dorsiflexion (L5,4): 5/5 Heel Walk (L5): WNL Toe Walk (S1): WNL Rise/Squat (L4): WNL  SENSORY B Medial Foot (L4): WNL B Dorsum (L5): WNL B Lateral (S1): WNL Light Touch: WNL Pinprick: WNL  REFLEXES Knee (L4): 2+ Ankle (S1): 2+  B SLR, seated: neg B SLR, supine: neg B FABER: neg B Reverse FABER: neg B Greater Troch: NT B Log Roll: neg B Stork: NT B Sciatic Notch: NT   Assessment and Plan: Lumbar radiculopathy, chronic  DDD (degenerative disc disease), lumbosacral   I really think that this patient pain is coming almost exclusively from her back. She is reluctant to do any kind of further procedures or any kind of epidural steroids. This is a patient it would be good for pain management. Right now and will start her on some fentanyl patches. She also is intervally going to be using some Vicodin on an as-needed basis. She currently is using one or 2 a day.  Titrate up neurontin as tolerated  12/31/2013 Last OV  with Heather Loffler, MD  The patient's pain is not coming from her knee. I suspect that this is all lumbar radiculopathy. Not consistent with intra-articular knee pathology.  I have given her a very detailed titration protocol for titrating up her gabapentin. She was anxious and reluctant to start any other medications or any that have been previously described as antidepressant.  New Prescriptions   FENTANYL (DURAGESIC - DOSED MCG/HR) 12 MCG/HR    Place 1 patch (12.5 mcg total) onto the skin every 3 (three) days.   There are no Patient Instructions on file for this visit.   Signed,  Maud Deed. Valentine Kuechle, MD, [place] at [facility] [place] [address] Phone: [phone] Fax: [phone]   Patient's Medications  New Prescriptions   FENTANYL (DURAGESIC - DOSED MCG/HR) 12 MCG/HR    Place 1 patch (12.5 mcg total) onto the skin every 3 (three) days.  Previous Medications   ACETAMINOPHEN (TYLENOL) 500 MG TABLET    Take 500 mg by mouth as needed.     AMOXICILLIN (AMOXIL) 875 MG TABLET    Take 1 tablet (875 mg  total) by mouth 2 (two) times daily.   ATENOLOL (TENORMIN) 25 MG TABLET    Take 1 tablet (25 mg total) by mouth daily. After patient completes bystolic medication change to atenolol, see Dr Mariana Arn office, this is for insurer purposes   GABAPENTIN (NEURONTIN) 300 MG CAPSULE    Use dose schedule as directed.   HYDROCHLOROTHIAZIDE (HYDRODIURIL) 25 MG TABLET    TAKE 1 TABLET BY MOUTH EVERY DAY   HYDROCODONE-ACETAMINOPHEN (NORCO) 7.5-325 MG PER TABLET    TAKE 1 TABLET BY MOUTH TWICE DAILY AS NEEDED FOR PAIN   LISINOPRIL (PRINIVIL,ZESTRIL) 40 MG TABLET    Take 1 tablet (40 mg total) by mouth daily.   MECLIZINE (ANTIVERT) 25 MG TABLET    Take 25 mg by mouth as needed.   METH-HYO-M BL-NA PHOS-PH SAL (URIBEL PO)    Take by mouth as needed.    PROMETHAZINE-DEXTROMETHORPHAN (PROMETHAZINE-DM) 6.25-15 MG/5ML SYRUP    Take 5 mLs by mouth 4 (four)  times daily as needed for cough.   TRAMADOL (ULTRAM) 50 MG TABLET    TAKE 1 TABLET BY MOUTH EVERY 6 HOURS AS NEEDED FOR PAIN  Modified Medications   No medications on file  Discontinued Medications   No medications on file

## 2014-03-13 NOTE — Telephone Encounter (Signed)
Spoke to pt and informed her Rx is available for pick up at the front desk 

## 2014-03-13 NOTE — Progress Notes (Signed)
Pre visit review using our clinic review tool, if applicable. No additional management support is needed unless otherwise documented below in the visit note. 

## 2014-03-28 ENCOUNTER — Other Ambulatory Visit: Payer: Self-pay | Admitting: Family Medicine

## 2014-03-28 NOTE — Telephone Encounter (Signed)
Rx faxed to requested pharmacy 

## 2014-03-28 NOTE — Telephone Encounter (Signed)
Pt requesting medication refill. Per Dr Deborra Medina, [facility] to fill. Last f/u appt 10/2013 with no future appts scheduled.

## 2014-04-11 ENCOUNTER — Other Ambulatory Visit (HOSPITAL_COMMUNITY): Payer: Self-pay | Admitting: Obstetrics & Gynecology

## 2014-04-11 DIAGNOSIS — R1032 Left lower quadrant pain: Secondary | ICD-10-CM

## 2014-04-11 DIAGNOSIS — D251 Intramural leiomyoma of uterus: Secondary | ICD-10-CM

## 2014-04-11 DIAGNOSIS — D259 Leiomyoma of uterus, unspecified: Secondary | ICD-10-CM | POA: Diagnosis not present

## 2014-04-12 DIAGNOSIS — N949 Unspecified condition associated with female genital organs and menstrual cycle: Secondary | ICD-10-CM | POA: Diagnosis not present

## 2014-04-12 DIAGNOSIS — N3010 Interstitial cystitis (chronic) without hematuria: Secondary | ICD-10-CM | POA: Diagnosis not present

## 2014-04-15 ENCOUNTER — Encounter (INDEPENDENT_AMBULATORY_CARE_PROVIDER_SITE_OTHER): Payer: Self-pay

## 2014-04-15 ENCOUNTER — Ambulatory Visit (HOSPITAL_COMMUNITY)
Admission: RE | Admit: 2014-04-15 | Discharge: 2014-04-15 | Disposition: A | Discharge status: Home / Self Care | Payer: Medicare Other | Source: Ambulatory Visit | Attending: Obstetrics & Gynecology | Admitting: Obstetrics & Gynecology

## 2014-04-15 ENCOUNTER — Other Ambulatory Visit: Payer: Self-pay | Admitting: *Deleted

## 2014-04-15 DIAGNOSIS — R1032 Left lower quadrant pain: Secondary | ICD-10-CM | POA: Diagnosis not present

## 2014-04-15 DIAGNOSIS — D259 Leiomyoma of uterus, unspecified: Secondary | ICD-10-CM | POA: Insufficient documentation

## 2014-04-15 DIAGNOSIS — D251 Intramural leiomyoma of uterus: Secondary | ICD-10-CM

## 2014-04-15 DIAGNOSIS — N838 Other noninflammatory disorders of ovary, fallopian tube and broad ligament: Secondary | ICD-10-CM | POA: Diagnosis not present

## 2014-04-15 DIAGNOSIS — J984 Other disorders of lung: Secondary | ICD-10-CM | POA: Insufficient documentation

## 2014-04-15 MED ORDER — HYDROCODONE-ACETAMINOPHEN 7.5-325 MG PO TABS: ORAL_TABLET | ORAL | Status: DC

## 2014-04-15 MED ORDER — IOHEXOL 300 MG/ML  SOLN
100.0000 mL | Freq: Once | INTRAMUSCULAR | Status: AC | PRN
  Administered 2014-04-15: 100 mL via INTRAVENOUS

## 2014-04-15 NOTE — Telephone Encounter (Signed)
Heather Shaw notified prescription is ready to be picked up at front desk.

## 2014-04-15 NOTE — Telephone Encounter (Signed)
I think this is reasonable.   If she continues to have problems, she might be someone who would benefit from pain management consult.

## 2014-04-15 NOTE — Telephone Encounter (Signed)
Pt left voicemail requesting Hydrocodone rx. She states she tried they Fentanyl patches prescribed by Dr Lorelei Pont, but they made her nauseous and dizzy. She stopped the patches, and only has 1 pill of hydrocodone left. I returned pt's call, and informed her that DrAron was out of the office today. Since she only has one tab left I told her I would send the message to Dr Lorelei Pont, to see if he could write the rx.

## 2014-04-16 DIAGNOSIS — N83209 Unspecified ovarian cyst, unspecified side: Secondary | ICD-10-CM | POA: Diagnosis not present

## 2014-04-16 DIAGNOSIS — D259 Leiomyoma of uterus, unspecified: Secondary | ICD-10-CM | POA: Diagnosis not present

## 2014-04-17 ENCOUNTER — Telehealth: Payer: Self-pay | Admitting: Family Medicine

## 2014-04-17 NOTE — Telephone Encounter (Signed)
Lm on pts vm informing her that she may schedule nurse visit to receive immunization

## 2014-04-17 NOTE — Telephone Encounter (Signed)
Closed in error.

## 2014-04-17 NOTE — Telephone Encounter (Signed)
Yes ok for her to come in for Tdap

## 2014-04-17 NOTE — Telephone Encounter (Signed)
Pt called stating her daughter in law is getting ready to have a baby and needs the whooping cough vaccine. Looks like she had Td in 2009 but not the pertussis. Does she need to come in for Tdap? Please advise.

## 2014-04-18 ENCOUNTER — Telehealth: Payer: Self-pay | Admitting: Family Medicine

## 2014-04-18 NOTE — Telephone Encounter (Signed)
Lm additional message on pts vm; see 9/23 phone note

## 2014-04-18 NOTE — Telephone Encounter (Signed)
Pt called to request whooping cough (pertussis) vaccine. Daughter-in law is having a baby and OB/Gyn told her that anyone who comes in contact with the baby should have this vaccine. There are no Nurse visits in time before the baby is due to be born.

## 2014-04-19 DIAGNOSIS — N3010 Interstitial cystitis (chronic) without hematuria: Secondary | ICD-10-CM | POA: Diagnosis not present

## 2014-04-19 DIAGNOSIS — N949 Unspecified condition associated with female genital organs and menstrual cycle: Secondary | ICD-10-CM | POA: Diagnosis not present

## 2014-04-19 NOTE — Telephone Encounter (Signed)
Scheduled 04/23/14

## 2014-04-23 ENCOUNTER — Ambulatory Visit (INDEPENDENT_AMBULATORY_CARE_PROVIDER_SITE_OTHER): Payer: Medicare Other | Admitting: *Deleted

## 2014-04-23 DIAGNOSIS — Z23 Encounter for immunization: Secondary | ICD-10-CM

## 2014-05-10 DIAGNOSIS — R102 Pelvic and perineal pain: Secondary | ICD-10-CM | POA: Diagnosis not present

## 2014-05-10 DIAGNOSIS — N3010 Interstitial cystitis (chronic) without hematuria: Secondary | ICD-10-CM | POA: Diagnosis not present

## 2014-05-13 ENCOUNTER — Other Ambulatory Visit: Payer: Self-pay | Admitting: Urology

## 2014-05-15 ENCOUNTER — Encounter (HOSPITAL_BASED_OUTPATIENT_CLINIC_OR_DEPARTMENT_OTHER): Payer: Self-pay | Admitting: *Deleted

## 2014-05-16 ENCOUNTER — Encounter (HOSPITAL_BASED_OUTPATIENT_CLINIC_OR_DEPARTMENT_OTHER): Payer: Self-pay | Admitting: *Deleted

## 2014-05-16 NOTE — Progress Notes (Signed)
NPO AFTER MN. ARRIVE AT 0745. NEEDS ISTAT AND EKG. WILL TAKE ATENOLOL AM DOS W/ SIPS OF WATER AND IF NEEDED TAKE MELCIZINE OR PAIN MED .

## 2014-05-17 ENCOUNTER — Other Ambulatory Visit: Payer: Self-pay

## 2014-05-17 MED ORDER — HYDROCODONE-ACETAMINOPHEN 7.5-325 MG PO TABS: ORAL_TABLET | ORAL | Status: DC

## 2014-05-17 MED ORDER — HYDROCHLOROTHIAZIDE 25 MG PO TABS: ORAL_TABLET | ORAL | Status: DC

## 2014-05-17 NOTE — Telephone Encounter (Signed)
Heather Shaw notified prescription is ready to be picked up at front desk.

## 2014-05-17 NOTE — Telephone Encounter (Signed)
Pt left v/m requesting rx hydrocodone apap. Call when ready for pick up. Pt has 2 pills left and request pick up today.

## 2014-05-17 NOTE — H&P (Signed)
History of Present Illness   Heather Shaw presents today to discuss her ongoing pelvic pain. I saw her originally in February of 2012. At that time, she was having some pelvic discomfort as well as some irritative voiding symptoms. This situation was potentially consistent with painful bladder syndrome/interstitial cystitis. I did take her to surgery and went ahead with a cystoscopy and hydraulic overdistention of her bladder. This showed normal capacity, but she did have fairly diffuse glomerular hemorrhaging consistent with painful bladder syndrome. She did get substantial improvement with that procedure. I last saw her back in the summer of 2013. At that time, she told me that for the most part she did quite well, as long as she was fairly consistent with her dietary modifications. If she did deviate, she would often have increased discomfort. She recently has had increased pain. She has nonspecific pelvic discomfort that is bilateral into her inguinal and pelvic region. She does have at least mild to moderate nocturia with minimal frequency. She has had a repeat assessment by her gynecologist, Dr Dory Horn. She does have known fibroid tumor, but recent ultrasound did not show any change. She has been given a Marcaine instillation of her bladder by Jimmey Ralph, but that has not resulted in any obvious significant improvement in some of her discomfort. She is here to discuss the possibility of trying a repeat cystoscopy and hydraulic overdistention of her bladder.     Past Medical History Problems  1. History of Anxiety (F41.9) 2. Former smoker [phone]) 3. History of hypercholesterolemia (Z86.39) 4. History of hypertension (Z86.79) 5. History of vertigo (Z87.898) 6. History of Lumbar Disc Degeneration L4 - L5  Surgical History Problems  1. History of Arthroscopy Knee Right 2. History of Bladder Irrigation 3. History of Cystoscopy With Dilation Of Bladder 4. History of Dilation And  Curettage  Current Meds 1. Atenolol TABS;  Therapy: (Recorded:18Sep2015) to Recorded 2. Hydrochlorothiazide 25 MG Oral Tablet; TAKE 1 TABLET DAILY;  Therapy: (Recorded:20Feb2012) to Recorded 3. Hydrocodone-Acetaminophen 7.5-500 MG TABS;  Therapy: [phone] to Recorded 4. Lisinopril 40 MG Oral Tablet;  Therapy: [id number] to Recorded 5. Uribel 118 MG Oral Capsule; TAKE ONE CAPSULE BY MOUTH 3 TIMES A DAY AS  NEEDED FOR BURNING;  Therapy: 22Mar2012 to (Evaluate:25Jul2015)  Requested for: [phone]; Last  Rx:15Jul2015 Ordered 6. Vitamin D3 1000 UNIT Oral Capsule; TAKE 1 CAPSULE Every 6 hours;  Therapy: (Recorded:20Feb2012) to Recorded  Allergies Medication  1. NSAIDs 2. Codeine Derivatives 3. Sulfa Drugs  Family History Problems  1. Family history of Acute Myocardial Infarction : Paternal Grandfather 2. Family history of Acute Myocardial Infarction : Paternal Grandmother 3. Family history of Acute Myocardial Infarction : Maternal Grandfather 4. Family history of Acute Myocardial Infarction : Maternal Grandmother 5. Family history of Death In The Family Child Daughter   54  yo- accident 58. Family history of Death In The Family Father   72 yo heart attack 7. Family history of Death In The Family Mother   51 yo pulmonary embolism 8. Family history of Heart Disease : Father 34. Family history of Hematuria : Mother 54. Family history of Pulmonary Artery Thrombosis : Mother 4. Family history of Renal Failure : Father 32. Family history of Venous Thrombosis : Mother  Social History Problems  1. Denied: History of Alcohol Use 2. Caffeine Use   2/day 3. Former smoker [phone]) 4. Marital History - Currently Married 5. Occupation:   Homemaker  Review of Systems Genitourinary, constitutional, skin, eye, otolaryngeal, hematologic/lymphatic, cardiovascular,  pulmonary, endocrine, musculoskeletal, gastrointestinal, neurological and psychiatric system(s) were reviewed and  pertinent findings if present are noted.  Genitourinary: urinary frequency, nocturia, incontinence, pelvic pain, suprapubic pain and inguinal pain.  Constitutional: feeling tired (fatigue).  Integumentary: skin rash/lesion.  Musculoskeletal: back pain.    Vitals Vital Signs [Data Includes: Last 1 Day]  Recorded: 16Oct2015 03:25PM  Blood Pressure: 155 / 83 Temperature: 98.3 F Heart Rate: 57  Well-developed well-nourished female in no acute distress Respiratory effort normal Cardiac regular rate and rhythm Abdomen soft nontender Extremities without edema or tenderness Neurologic nonfocal  Results/Data Urine [Data Includes: Last 1 Day]   16Oct2015 COLOR YELLOW  APPEARANCE CLEAR  SPECIFIC GRAVITY 1.015  pH 5.5  GLUCOSE NEG mg/dL BILIRUBIN NEG  KETONE NEG mg/dL BLOOD NEG  PROTEIN NEG mg/dL UROBILINOGEN 0.2 mg/dL NITRITE NEG  LEUKOCYTE ESTERASE NEG   Assessment Assessed  1. Female pelvic pain (R10.2) 2. Chronic interstitial cystitis without hematuria (N30.10)  Plan Health Maintenance  1. UA With REFLEX; [Do Not Release]; Status:Complete;   Done: [id number] 03:11PM  Discussion/Summary   Heather Shaw has had recurrent pelvic discomfort. She often gets some relief right after voiding, but also will have pain that can occur in association with urination. Her symptoms certainly suggest that her bladder may indeed be the source of her discomfort. It appears that she is having a flare of her interstitial cystitis. She is interested in a repeat hydraulic overdistention. It has been about 3-1/2 years since that procedure and I think this is a very reasonable approach. It is her contention that is probably her bladder causing her discomfort and apparently Dr Nori Riis felt as well. I think given the pain association with the act of voiding, I do think that is likely to be the case. Her urinalysis today is clear and there is no other obvious concern over other pathology. We will try to set her  up for outpatient cystoscopy with hydraulic overdistention of her bladder sometime in the next several weeks. Again, we will remind her that the pain may worsen initially before getting improvement after this procedure. We will set her up a followup afterwards with Diane to assess her progress.

## 2014-05-20 ENCOUNTER — Encounter (HOSPITAL_BASED_OUTPATIENT_CLINIC_OR_DEPARTMENT_OTHER): Payer: Self-pay | Admitting: *Deleted

## 2014-05-20 ENCOUNTER — Ambulatory Visit (HOSPITAL_BASED_OUTPATIENT_CLINIC_OR_DEPARTMENT_OTHER): Payer: Medicare Other | Admitting: Anesthesiology

## 2014-05-20 ENCOUNTER — Encounter (HOSPITAL_BASED_OUTPATIENT_CLINIC_OR_DEPARTMENT_OTHER): Payer: Medicare Other | Admitting: Anesthesiology

## 2014-05-20 ENCOUNTER — Ambulatory Visit (HOSPITAL_BASED_OUTPATIENT_CLINIC_OR_DEPARTMENT_OTHER)
Admission: RE | Admit: 2014-05-20 | Discharge: 2014-05-20 | Disposition: A | Discharge status: Home / Self Care | Payer: Medicare Other | Source: Ambulatory Visit | Attending: Urology | Admitting: Urology

## 2014-05-20 ENCOUNTER — Encounter (HOSPITAL_BASED_OUTPATIENT_CLINIC_OR_DEPARTMENT_OTHER)
Admission: RE | Disposition: A | Discharge status: Home / Self Care | Payer: Self-pay | Source: Ambulatory Visit | Attending: Urology

## 2014-05-20 DIAGNOSIS — I10 Essential (primary) hypertension: Secondary | ICD-10-CM | POA: Insufficient documentation

## 2014-05-20 DIAGNOSIS — M5136 Other intervertebral disc degeneration, lumbar region: Secondary | ICD-10-CM | POA: Diagnosis not present

## 2014-05-20 DIAGNOSIS — Z87891 Personal history of nicotine dependence: Secondary | ICD-10-CM | POA: Insufficient documentation

## 2014-05-20 DIAGNOSIS — E780 Pure hypercholesterolemia: Secondary | ICD-10-CM | POA: Diagnosis not present

## 2014-05-20 DIAGNOSIS — F419 Anxiety disorder, unspecified: Secondary | ICD-10-CM | POA: Diagnosis not present

## 2014-05-20 DIAGNOSIS — R102 Pelvic and perineal pain: Secondary | ICD-10-CM | POA: Diagnosis not present

## 2014-05-20 DIAGNOSIS — N3010 Interstitial cystitis (chronic) without hematuria: Secondary | ICD-10-CM | POA: Diagnosis not present

## 2014-05-20 HISTORY — DX: Leiomyoma of uterus, unspecified: D25.9

## 2014-05-20 HISTORY — PX: CYSTO WITH HYDRODISTENSION: SHX5453

## 2014-05-20 HISTORY — DX: Nausea with vomiting, unspecified: R11.2

## 2014-05-20 HISTORY — DX: Presence of spectacles and contact lenses: Z97.3

## 2014-05-20 HISTORY — DX: Interstitial cystitis (chronic) without hematuria: N30.10

## 2014-05-20 HISTORY — DX: Presence of dental prosthetic device (complete) (partial): Z97.2

## 2014-05-20 HISTORY — DX: Other symptoms and signs involving the genitourinary system: R39.89

## 2014-05-20 HISTORY — DX: Other specified postprocedural states: Z98.890

## 2014-05-20 HISTORY — DX: Unspecified osteoarthritis, unspecified site: M19.90

## 2014-05-20 LAB — POCT I-STAT, CHEM 8
BUN: 7 mg/dL (ref 6–23)
CHLORIDE: 95 meq/L — AB (ref 96–112)
Calcium, Ion: 1.25 mmol/L (ref 1.13–1.30)
Creatinine, Ser: 0.6 mg/dL (ref 0.50–1.10)
Glucose, Bld: 100 mg/dL — ABNORMAL HIGH (ref 70–99)
HCT: 39 % (ref 36.0–46.0)
Hemoglobin: 13.3 g/dL (ref 12.0–15.0)
POTASSIUM: 3.3 meq/L — AB (ref 3.7–5.3)
SODIUM: 134 meq/L — AB (ref 137–147)
TCO2: 25 mmol/L (ref 0–100)

## 2014-05-20 SURGERY — CYSTOSCOPY, WITH BLADDER HYDRODISTENSION
Anesthesia: General | Site: Bladder

## 2014-05-20 MED ORDER — BUPIVACAINE HCL (PF) 0.25 % IJ SOLN
INTRAMUSCULAR | Status: DC | PRN
  Administered 2014-05-20: 30 mL

## 2014-05-20 MED ORDER — MIDAZOLAM HCL 2 MG/2ML IJ SOLN
INTRAMUSCULAR | Status: AC
  Filled 2014-05-20: qty 2

## 2014-05-20 MED ORDER — LACTATED RINGERS IV SOLN
INTRAVENOUS | Status: DC
  Filled 2014-05-20: qty 1000

## 2014-05-20 MED ORDER — GLYCOPYRROLATE 0.2 MG/ML IJ SOLN
INTRAMUSCULAR | Status: DC | PRN
  Administered 2014-05-20: 0.2 mg via INTRAVENOUS

## 2014-05-20 MED ORDER — EPHEDRINE SULFATE 50 MG/ML IJ SOLN
INTRAMUSCULAR | Status: DC | PRN
  Administered 2014-05-20: 10 mg via INTRAVENOUS

## 2014-05-20 MED ORDER — STERILE WATER FOR IRRIGATION IR SOLN
Status: DC | PRN
  Administered 2014-05-20: 500 mL via INTRAVESICAL

## 2014-05-20 MED ORDER — FENTANYL CITRATE 0.05 MG/ML IJ SOLN
25.0000 ug | INTRAMUSCULAR | Status: DC | PRN
  Filled 2014-05-20: qty 1

## 2014-05-20 MED ORDER — LIDOCAINE HCL 2 % EX GEL
CUTANEOUS | Status: DC | PRN
  Administered 2014-05-20: 1

## 2014-05-20 MED ORDER — LACTATED RINGERS IV SOLN
INTRAVENOUS | Status: DC
  Administered 2014-05-20 (×2): via INTRAVENOUS
  Filled 2014-05-20: qty 1000

## 2014-05-20 MED ORDER — FENTANYL CITRATE 0.05 MG/ML IJ SOLN
INTRAMUSCULAR | Status: DC | PRN
  Administered 2014-05-20 (×2): 12.5 ug via INTRAVENOUS
  Administered 2014-05-20: 25 ug via INTRAVENOUS
  Administered 2014-05-20 (×4): 12.5 ug via INTRAVENOUS

## 2014-05-20 MED ORDER — ONDANSETRON HCL 4 MG/2ML IJ SOLN
INTRAMUSCULAR | Status: DC | PRN
  Administered 2014-05-20: 4 mg via INTRAVENOUS

## 2014-05-20 MED ORDER — DEXAMETHASONE SODIUM PHOSPHATE 4 MG/ML IJ SOLN: INTRAMUSCULAR | Status: DC | PRN

## 2014-05-20 MED ORDER — CEFAZOLIN SODIUM-DEXTROSE 2-3 GM-% IV SOLR
2.0000 g | INTRAVENOUS | Status: AC
Start: 2014-05-20 — End: 2014-05-20
  Administered 2014-05-20: 2 g via INTRAVENOUS
  Filled 2014-05-20: qty 50

## 2014-05-20 MED ORDER — ACETAMINOPHEN 10 MG/ML IV SOLN
INTRAVENOUS | Status: DC | PRN
  Administered 2014-05-20: 1000 mg via INTRAVENOUS

## 2014-05-20 MED ORDER — CEFAZOLIN SODIUM-DEXTROSE 2-3 GM-% IV SOLR
INTRAVENOUS | Status: AC
  Filled 2014-05-20: qty 50

## 2014-05-20 MED ORDER — LIDOCAINE HCL (CARDIAC) 20 MG/ML IV SOLN
INTRAVENOUS | Status: DC | PRN
  Administered 2014-05-20: 80 mg via INTRAVENOUS

## 2014-05-20 MED ORDER — OXYCHLOROSENE SODIUM POWD
Status: DC | PRN
  Administered 2014-05-20: 1

## 2014-05-20 MED ORDER — MIDAZOLAM HCL 5 MG/5ML IJ SOLN
INTRAMUSCULAR | Status: DC | PRN
  Administered 2014-05-20 (×2): 1 mg via INTRAVENOUS

## 2014-05-20 MED ORDER — PROPOFOL INFUSION 10 MG/ML OPTIME
INTRAVENOUS | Status: DC | PRN
  Administered 2014-05-20: 150 mL via INTRAVENOUS

## 2014-05-20 MED ORDER — FENTANYL CITRATE 0.05 MG/ML IJ SOLN
INTRAMUSCULAR | Status: AC
  Filled 2014-05-20: qty 2

## 2014-05-20 SURGICAL SUPPLY — 22 items
BAG DRAIN URO-CYSTO SKYTR STRL (DRAIN) ×3 IMPLANT
BAG DRN UROCATH (DRAIN) ×1
CANISTER SUCT LVC 12 LTR MEDI- (MISCELLANEOUS) ×2 IMPLANT
CATH ROBINSON RED A/P 14FR (CATHETERS) IMPLANT
CATH ROBINSON RED A/P 16FR (CATHETERS) IMPLANT
CATH ROBINSON RED A/P 18FR (CATHETERS) ×3 IMPLANT
CLOTH BEACON ORANGE TIMEOUT ST (SAFETY) ×3 IMPLANT
DRAPE CAMERA CLOSED 9X96 (DRAPES) ×3 IMPLANT
ELECT REM PT RETURN 9FT ADLT (ELECTROSURGICAL)
ELECTRODE REM PT RTRN 9FT ADLT (ELECTROSURGICAL) ×1 IMPLANT
GLOVE BIO SURGEON STRL SZ7.5 (GLOVE) ×3 IMPLANT
GLOVE SURG SS PI 7.5 STRL IVOR (GLOVE) ×4 IMPLANT
GOWN PREVENTION PLUS LG XLONG (DISPOSABLE) ×1 IMPLANT
GOWN STRL REIN XL XLG (GOWN DISPOSABLE) ×1 IMPLANT
GOWN STRL REUS W/ TWL XL LVL3 (GOWN DISPOSABLE) IMPLANT
GOWN STRL REUS W/TWL XL LVL3 (GOWN DISPOSABLE) ×6
NEEDLE HYPO 22GX1.5 SAFETY (NEEDLE) IMPLANT
NS IRRIG 500ML POUR BTL (IV SOLUTION) IMPLANT
PACK CYSTO (CUSTOM PROCEDURE TRAY) ×3 IMPLANT
SYR BULB IRRIGATION 50ML (SYRINGE) ×2 IMPLANT
WATER STERILE IRR 3000ML UROMA (IV SOLUTION) ×3 IMPLANT
WATER STERILE IRR 500ML POUR (IV SOLUTION) ×2 IMPLANT

## 2014-05-20 NOTE — Op Note (Signed)
Preoperative diagnosis:  Interstitial Cystitis Postoperative diagnosis: Same Procedure:  Cystoscopy and hydraulic overdistention of the bladder with instillation of Clorpactin and Marcaine Surgeon Bernestine Amass, MD Anesthesia: General  Indication: Heather Shaw has a long-standing history of interstitial cystitis.She has had a recent flare and requested repeat hydraulic overdistention of the bladder.  Technique and findings:   The patient was brought to the operating room where [facility] successful induction of general anesthesia.The patient was placed in lithotomy position and prepped and draped in usual manner. Appropriate surgical timeout was performed.The patient received perioperative antibiotics. Initial cystoscopy revealed no abnormalities of the bladder. The patient underwent hydraulic overdistention of the bladder to 100 cm of water pressure for 5 minutes. Capacity was felt to be 1000 cc. The patient had diffuse 3+ glomerular bleeding noted consistent with the diagnosis of interstitial cystitis. The patient had instillation of Clorpactin in a standard manner with gravity administration. Marcaine was then instilled in the bladder.The patient was brought to recovery room in stable condition having no obvious complications or problems.   Bernestine Amass, MD 05/20/2014, 10:18 AM

## 2014-05-20 NOTE — Anesthesia Procedure Notes (Signed)
Procedure Name: LMA Insertion Date/Time: 05/20/2014 9:42 AM Performed by: Justice Rocher Pre-anesthesia Checklist: Patient identified, Emergency Drugs available, Suction available and Patient being monitored Patient Re-evaluated:Patient Re-evaluated prior to inductionOxygen Delivery Method: Circle System Utilized Preoxygenation: Pre-oxygenation with 100% oxygen Intubation Type: IV induction Ventilation: Mask ventilation without difficulty LMA: LMA inserted LMA Size: 4.0 Number of attempts: 1 Airway Equipment and Method: bite block Placement Confirmation: positive ETCO2 Tube secured with: Tape Dental Injury: Teeth and Oropharynx as per pre-operative assessment

## 2014-05-20 NOTE — Interval H&P Note (Signed)
History and Physical Interval Note:  05/20/2014 8:35 AM  Heather Shaw  has presented today for surgery, with the diagnosis of INTERSTITIAL CYSTITIS  The various methods of treatment have been discussed with the patient and family. After consideration of risks, benefits and other options for treatment, the patient has consented to  Procedure(s): CYSTOSCOPY/HYDRODISTENSION (N/A) as a surgical intervention .  The patient's history has been reviewed, patient examined, no change in status, stable for surgery.  I have reviewed the patient's chart and labs.  Questions were answered to the patient's satisfaction.     Makaria Poarch S

## 2014-05-20 NOTE — Anesthesia Postprocedure Evaluation (Signed)
  Anesthesia Post-op Note  Patient: Heather Shaw  Procedure(s) Performed: Procedure(s) (LRB): CYSTOSCOPY/HYDRODISTENSION, INSTILLATION OF CHLORPACTIN (N/A)  Patient Location: PACU  Anesthesia Type: General  Level of Consciousness: awake and alert   Airway and Oxygen Therapy: Patient Spontanous Breathing  Post-op Pain: mild  Post-op Assessment: Post-op Vital signs reviewed, Patient's Cardiovascular Status Stable, Respiratory Function Stable, Patent Airway and No signs of Nausea or vomiting  Last Vitals:  Filed Vitals:   05/20/14 1100  BP: 129/59  Pulse: 48  Temp:   Resp: 10    Post-op Vital Signs: stable   Complications: No apparent anesthesia complications

## 2014-05-20 NOTE — Transfer of Care (Signed)
Immediate Anesthesia Transfer of Care Note  Patient: Heather Shaw  Procedure(s) Performed: Procedure(s) (LRB): CYSTOSCOPY/HYDRODISTENSION, INSTILLATION OF CHLORPACTIN (N/A)  Patient Location: PACU  Anesthesia Type: General  Level of Consciousness: awake, sedated, patient cooperative and responds to stimulation  Airway & Oxygen Therapy: Patient Spontanous Breathing and Patient connected to face mask oxygen  Post-op Assessment: Report given to PACU RN, Post -op Vital signs reviewed and stable and Patient moving all extremities  Post vital signs: Reviewed and stable  Complications: No apparent anesthesia complications

## 2014-05-20 NOTE — Anesthesia Preprocedure Evaluation (Addendum)
Anesthesia Evaluation  Patient identified by MRN, date of birth, ID band Patient awake    Reviewed: Allergy & Precautions, H&P , NPO status , Patient's Chart, lab work & pertinent test results, reviewed documented beta blocker date and time   History of Anesthesia Complications (+) PONV  Airway Mallampati: II  TM Distance: >3 FB Neck ROM: full    Dental  (+) Dental Advisory Given, Caps All front teeth are capped:   Pulmonary neg pulmonary ROS, former smoker,  breath sounds clear to auscultation  Pulmonary exam normal       Cardiovascular Exercise Tolerance: Good hypertension, Pt. on home beta blockers and Pt. on medications Rhythm:regular Rate:Normal     Neuro/Psych vertigo negative neurological ROS  negative psych ROS   GI/Hepatic negative GI ROS, Neg liver ROS,   Endo/Other  negative endocrine ROS  Renal/GU negative Renal ROS  negative genitourinary   Musculoskeletal   Abdominal   Peds  Hematology negative hematology ROS (+)   Anesthesia Other Findings   Reproductive/Obstetrics negative OB ROS                             Anesthesia Physical Anesthesia Plan  ASA: II  Anesthesia Plan: General   Post-op Pain Management:    Induction: Intravenous  Airway Management Planned: LMA  Additional Equipment:   Intra-op Plan:   Post-operative Plan:   Informed Consent: I have reviewed the patients History and Physical, chart, labs and discussed the procedure including the risks, benefits and alternatives for the proposed anesthesia with the patient or authorized representative who has indicated his/her understanding and acceptance.   Dental Advisory Given  Plan Discussed with: CRNA and Surgeon  Anesthesia Plan Comments:         Anesthesia Quick Evaluation

## 2014-05-20 NOTE — Discharge Instructions (Signed)

## 2014-05-21 ENCOUNTER — Encounter (HOSPITAL_BASED_OUTPATIENT_CLINIC_OR_DEPARTMENT_OTHER): Payer: Self-pay | Admitting: Urology

## 2014-06-04 ENCOUNTER — Other Ambulatory Visit: Payer: Self-pay | Admitting: *Deleted

## 2014-06-04 MED ORDER — TRAMADOL HCL 50 MG PO TABS: ORAL_TABLET | ORAL | Status: DC

## 2014-06-04 NOTE — Telephone Encounter (Signed)
Rx called in to pharmacy. 

## 2014-06-04 NOTE — Telephone Encounter (Signed)
Pt requesting medication refill.Last f/u appt 10/2013. pls advise

## 2014-06-11 DIAGNOSIS — N3010 Interstitial cystitis (chronic) without hematuria: Secondary | ICD-10-CM | POA: Diagnosis not present

## 2014-06-13 ENCOUNTER — Ambulatory Visit (INDEPENDENT_AMBULATORY_CARE_PROVIDER_SITE_OTHER): Payer: Medicare Other | Admitting: Internal Medicine

## 2014-06-13 ENCOUNTER — Encounter: Payer: Self-pay | Admitting: Internal Medicine

## 2014-06-13 VITALS — BP 128/70 | HR 54 | Temp 98.4°F | Wt 205.0 lb

## 2014-06-13 DIAGNOSIS — B349 Viral infection, unspecified: Secondary | ICD-10-CM | POA: Diagnosis not present

## 2014-06-13 DIAGNOSIS — J329 Chronic sinusitis, unspecified: Secondary | ICD-10-CM

## 2014-06-13 DIAGNOSIS — B9789 Other viral agents as the cause of diseases classified elsewhere: Secondary | ICD-10-CM

## 2014-06-13 MED ORDER — FLUTICASONE PROPIONATE 50 MCG/ACT NA SUSP: 1.0000 | Freq: Every day | NASAL | Status: DC

## 2014-06-13 NOTE — Progress Notes (Signed)
HPI  Pt presents to the clinic today with c/o of  nasal congestion, right ear fullness, and facial pain/pressure. This started 4 days ago. She is blowing light yellow mucous out of her nose. She has tried OTC nasal sprays with minimal relief. She denies fever, chills, or body aches. Her grandson has been sick with the same symptoms. She has no history of seasonal allergies.   Past Medical History  Diagnosis Date  . Hyperlipidemia   . Hypertension   . Vertigo   . DDD (degenerative disc disease), lumbosacral   . Arthritis   . IC (interstitial cystitis)   . PONV (postoperative nausea and vomiting)   . Bladder pain   . History of colon polyps   . Uterine fibroid   . Wears glasses   . Wears partial dentures     Current Outpatient Prescriptions  Medication Sig Dispense Refill  . acetaminophen (TYLENOL) 500 MG tablet Take 1,000 mg by mouth as needed.     Marland Kitchen atenolol (TENORMIN) 25 MG tablet Take 25 mg by mouth every morning. After patient completes bystolic medication change to atenolol, see Dr Mariana Arn office, this is for insurer purposes    . hydrochlorothiazide (HYDRODIURIL) 25 MG tablet TAKE 1 TABLET BY MOUTH EVERY DAY--  TAKES IN PM 30 tablet 0  . HYDROcodone-acetaminophen (NORCO) 7.5-325 MG per tablet TAKE 1 TABLET BY MOUTH TWICE DAILY AS NEEDED FOR PAIN 60 tablet 0  . lisinopril (PRINIVIL,ZESTRIL) 40 MG tablet Take 40 mg by mouth every evening.    . meclizine (ANTIVERT) 25 MG tablet Take 25 mg by mouth as needed.    . Meth-Hyo-M Bl-Na Phos-Ph Sal (URIBEL) 118 MG CAPS Take by mouth as needed.    . traMADol (ULTRAM) 50 MG tablet TAKE 1 TABLET BY MOUTH EVERY 6 HOURS AS NEEDED FOR PAIN 60 tablet 0   No current facility-administered medications for this visit.    Allergies  Allergen Reactions  . Allegra [Fexofenadine] Other (See Comments)    Makes pt nervous  . Codeine Nausea And Vomiting  . Prednisone     Causes elevation in blood pressure- ? All steroids cause same reaction  .  Sulfa Antibiotics Rash    Family History  Problem Relation Age of Onset  . COPD Father     History   Social History  . Marital Status: Married    Spouse Name: N/A    Number of Children: N/A  . Years of Education: N/A   Occupational History  . Not on file.   Social History Main Topics  . Smoking status: Former Smoker    Types: Cigarettes    Quit date: 05/17/1971  . Smokeless tobacco: Never Used  . Alcohol Use: No  . Drug Use: No  . Sexual Activity: Not on file   Other Topics Concern  . Not on file   Social History Narrative    ROS:  Constitutional: Pt reports headache.  Denies fever, malaise, fatigue,  or abrupt weight changes.  HEENT: Pt reports facial pressure, nasal congestion, ear fullness, and sore throat.   Respiratory: Denies difficulty breathing, shortness of breath, cough or sputum production.   Cardiovascular: Denies chest pain, chest tightness, palpitations or swelling in the hands or feet.    No other specific complaints in a complete review of systems (except as listed in HPI above).  PE:  BP 128/70 mmHg  Pulse 54  Temp(Src) 98.4 F (36.9 C) (Oral)  Wt 205 lb (92.987 kg)  SpO2 98%  Wt  Readings from Last 3 Encounters:  06/13/14 205 lb (92.987 kg)  05/20/14 202 lb (91.627 kg)  03/13/14 202 lb 12 oz (91.967 kg)    General: Appears her stated age, well developed, well nourished in NAD. HEENT: Head: normal shape and size;  Ears: Right ear: effusion with serous fluid; light reflex distorted: Left earTm's gray and intact, normal light reflex; Nose: mucosa pink and moist, septum midline; frontal and ethmoid tenderness with palpation; Throat/Mouth: Teeth present, mucosa pink and moist, no lesions or ulcerations noted.  Neck: no lymphadenopathy   Cardiovascular: Normal rate and rhythm. S1,S2 noted.  No murmur, rubs or gallops noted.  Pulmonary/Chest: Normal effort and positive vesicular breath sounds. No respiratory distress. No wheezes, rales or  ronchi noted.    BMET    Component Value Date/Time   NA 134* 05/20/2014 0923   K 3.3* 05/20/2014 0923   CL 95* 05/20/2014 0923   CO2 26 11/08/2013 1022   GLUCOSE 100* 05/20/2014 0923   BUN 7 05/20/2014 0923   CREATININE 0.60 05/20/2014 0923   CALCIUM 9.1 11/08/2013 1022   GFRNONAA >60 05/09/2010 0550   GFRAA  05/09/2010 0550    >60        The eGFR has been calculated using the MDRD equation. This calculation has not been validated in all clinical situations. eGFR's persistently <60 mL/min signify possible Chronic Kidney Disease.    Lipid Panel     Component Value Date/Time   CHOL 178 11/08/2013 1022   TRIG 106.0 11/08/2013 1022   HDL 34.70* 11/08/2013 1022   CHOLHDL 5 11/08/2013 1022   VLDL 21.2 11/08/2013 1022   LDLCALC 122* 11/08/2013 1022    CBC    Component Value Date/Time   WBC 7.5 05/09/2010 0550   RBC 4.98 05/09/2010 0550   HGB 13.3 05/20/2014 0923   HCT 39.0 05/20/2014 0923   PLT 220 05/09/2010 0550   MCV 85.0 05/09/2010 0550   MCH 29.4 05/09/2010 0550   MCHC 34.6 05/09/2010 0550   RDW 13.2 05/09/2010 0550   LYMPHSABS 1.0 05/09/2010 0550   MONOABS 0.1 05/09/2010 0550   EOSABS 0.0 05/09/2010 0550   BASOSABS 0.0 05/09/2010 0550    Hgb A1C No results found for: HGBA1C   Assessment and Plan:  Sinusitis:  This is most likely viral Patient is insistent to have an antibiotic- I declined at this time Rx for Flonase nasal spray Continue supportive care: saline rinse, antihistamines, and tylenol for pain

## 2014-06-13 NOTE — Progress Notes (Signed)
Pre visit review using our clinic review tool, if applicable. No additional management support is needed unless otherwise documented below in the visit note. 

## 2014-06-13 NOTE — Patient Instructions (Signed)

## 2014-06-14 ENCOUNTER — Encounter: Payer: Self-pay | Admitting: Internal Medicine

## 2014-06-14 ENCOUNTER — Other Ambulatory Visit: Payer: Self-pay | Admitting: *Deleted

## 2014-06-14 MED ORDER — HYDROCHLOROTHIAZIDE 25 MG PO TABS: ORAL_TABLET | ORAL | Status: DC

## 2014-06-18 ENCOUNTER — Other Ambulatory Visit: Payer: Self-pay | Admitting: Family Medicine

## 2014-06-18 MED ORDER — HYDROCODONE-ACETAMINOPHEN 7.5-325 MG PO TABS: ORAL_TABLET | ORAL | Status: DC

## 2014-06-18 NOTE — Telephone Encounter (Signed)
Pt called and would like HYDROcodone-acetaminophen (NORCO) 7.5-325 MG per tablet [[id number]]  Refilled.  Pt would like call back at [phone].

## 2014-06-18 NOTE — Telephone Encounter (Signed)
Please enter as refill request. 

## 2014-06-18 NOTE — Telephone Encounter (Signed)
Lm on pts vm and informed her Rx will be available for pickup after 1400

## 2014-06-18 NOTE — Telephone Encounter (Signed)
Pt requesting medication refill. Last f/u appt 10/2013. pls advise

## 2014-06-19 ENCOUNTER — Other Ambulatory Visit: Payer: Self-pay | Admitting: Internal Medicine

## 2014-06-19 ENCOUNTER — Telehealth: Payer: Self-pay

## 2014-06-19 MED ORDER — AMOXICILLIN 875 MG PO TABS: 875.0000 mg | ORAL_TABLET | Freq: Two times a day (BID) | ORAL | Status: DC

## 2014-06-19 NOTE — Telephone Encounter (Signed)
Left message on voicemail.

## 2014-06-19 NOTE — Telephone Encounter (Signed)
Amoxil sent to [facility]

## 2014-06-19 NOTE — Telephone Encounter (Signed)
Pt left v/m; pt was seen 06/13/14 and pt's S/T is worse than when seen 06/13/14 and pt request amoxicillin to [facility]. Pt request cb when med sent in .

## 2014-07-22 ENCOUNTER — Other Ambulatory Visit: Payer: Self-pay | Admitting: *Deleted

## 2014-07-22 MED ORDER — HYDROCODONE-ACETAMINOPHEN 7.5-325 MG PO TABS: ORAL_TABLET | ORAL | Status: DC

## 2014-07-22 NOTE — Telephone Encounter (Signed)
Patient notified that Rx was ready and placed up front for pick up.

## 2014-07-22 NOTE — Telephone Encounter (Signed)
Pt called and left message requesting hydrocodone refill. Rx last written on 06/18/14. Last appt was an acute on 06/23/14 with [facility] for sinusitis, previously seen by Dr Lorelei Pont on 03/13/14, 12/31/13, and 11/12/13 for back pain. Last appt with PCP Dr Deborra Medina was on 11/08/13 for a med refill, no future appts scheduled.  Last UDS was in 8/15, and pt was marked as low risk with her next screening due in 2/16

## 2014-07-31 ENCOUNTER — Other Ambulatory Visit: Payer: Self-pay

## 2014-07-31 MED ORDER — TRAMADOL HCL 50 MG PO TABS: ORAL_TABLET | ORAL | Status: DC

## 2014-07-31 NOTE — Telephone Encounter (Signed)
Rx called in to requested pharmacy 

## 2014-07-31 NOTE — Telephone Encounter (Signed)
Pt left v/m requesting refill tramadol to [facility]. Pt last seen 06/13/14 sick visit and last med refill appt 11/08/13. No future appt scheduled.Please advise.

## 2014-08-22 ENCOUNTER — Other Ambulatory Visit: Payer: Self-pay

## 2014-08-22 NOTE — Telephone Encounter (Signed)
Pt left v/m requesting rx hydrocodone apap. Call when ready for pick up. Last sick visit 03/13/14.Please advise.

## 2014-08-23 MED ORDER — HYDROCODONE-ACETAMINOPHEN 7.5-325 MG PO TABS: ORAL_TABLET | ORAL | Status: DC

## 2014-08-23 NOTE — Telephone Encounter (Signed)
RX printed and signed and given to WK 

## 2014-08-23 NOTE — Telephone Encounter (Signed)
Spoke to pt and informed her Rx is available for pickup from the front desk 

## 2014-09-10 ENCOUNTER — Other Ambulatory Visit: Payer: Self-pay | Admitting: Family Medicine

## 2014-09-10 NOTE — Telephone Encounter (Signed)
Last office visit 06/13/2014 with Webb Silversmith.  Last refilled 07/31/2014 for #60 with no refills.  Ok to refill?

## 2014-09-11 DIAGNOSIS — Z1231 Encounter for screening mammogram for malignant neoplasm of breast: Secondary | ICD-10-CM | POA: Diagnosis not present

## 2014-09-11 DIAGNOSIS — Z01419 Encounter for gynecological examination (general) (routine) without abnormal findings: Secondary | ICD-10-CM | POA: Diagnosis not present

## 2014-09-11 DIAGNOSIS — N760 Acute vaginitis: Secondary | ICD-10-CM | POA: Diagnosis not present

## 2014-09-11 DIAGNOSIS — Z6834 Body mass index (BMI) 34.0-34.9, adult: Secondary | ICD-10-CM | POA: Diagnosis not present

## 2014-09-11 NOTE — Telephone Encounter (Signed)
Ok to phone in tramadol 

## 2014-09-17 ENCOUNTER — Encounter: Payer: Self-pay | Admitting: Family Medicine

## 2014-09-19 ENCOUNTER — Ambulatory Visit (INDEPENDENT_AMBULATORY_CARE_PROVIDER_SITE_OTHER): Payer: Medicare Other | Admitting: Family Medicine

## 2014-09-19 ENCOUNTER — Encounter: Payer: Self-pay | Admitting: Family Medicine

## 2014-09-19 VITALS — BP 130/88 | HR 55 | Temp 97.9°F | Ht 65.0 in | Wt 204.1 lb

## 2014-09-19 DIAGNOSIS — L309 Dermatitis, unspecified: Secondary | ICD-10-CM

## 2014-09-19 DIAGNOSIS — G5601 Carpal tunnel syndrome, right upper limb: Secondary | ICD-10-CM | POA: Diagnosis not present

## 2014-09-19 DIAGNOSIS — G5600 Carpal tunnel syndrome, unspecified upper limb: Secondary | ICD-10-CM | POA: Insufficient documentation

## 2014-09-19 MED ORDER — FLUOCINONIDE-E 0.05 % EX CREA: 1.0000 "application " | TOPICAL_CREAM | Freq: Two times a day (BID) | CUTANEOUS | Status: DC

## 2014-09-19 NOTE — Assessment & Plan Note (Signed)
New- progressive. Advised to stop repetitive movements, get OTC wrist splint to wear particularly at night. Given exercises from sports med advisor. Given severity of symptoms, refer immediately to ortho to discuss injections/tx options.  Will not give course of oral steroids since she is allergic to oral steroids.

## 2014-09-19 NOTE — Progress Notes (Signed)
Pre visit review using our clinic review tool, if applicable. No additional management support is needed unless otherwise documented below in the visit note. 

## 2014-09-19 NOTE — Assessment & Plan Note (Signed)
New- lidex twice daily x 10 days. If symptoms do not improve, refer to derm for biopsy. The patient indicates understanding of these issues and agrees with the plan.

## 2014-09-19 NOTE — Progress Notes (Signed)
Subjective:   Patient ID: Heather Shaw, female    DOB: 1940-10-30, 74 y.o.   MRN: [id number]  Heather Shaw is a pleasant 74 y.o. year old female who presents to clinic today with Numbness and Rash  on 09/19/2014  HPI:  Numbness- right hand and fingers have been going numb for over 6 months, particularly with repetitive movement or when she is sleeping.  Denies any decreased grip strength or injury.  Has never had anything like this before.  Now her left hand is starting to exhibit some very mild symptoms as well.  Rash- left ankle- very dry, itchy patch of skin.  Started out as a smaller area and now growing in size.  No redness or warmth.  Has tried lotions without improvement of symptoms.  No changes in detergents or soaps.    Current Outpatient Prescriptions on File Prior to Visit  Medication Sig Dispense Refill  . acetaminophen (TYLENOL) 500 MG tablet Take 1,000 mg by mouth as needed.     Marland Kitchen amoxicillin (AMOXIL) 875 MG tablet Take 1 tablet (875 mg total) by mouth 2 (two) times daily. 20 tablet 0  . atenolol (TENORMIN) 25 MG tablet Take 25 mg by mouth every morning. After patient completes bystolic medication change to atenolol, see Dr Mariana Arn office, this is for insurer purposes    . hydrochlorothiazide (HYDRODIURIL) 25 MG tablet TAKE 1 TABLET BY MOUTH EVERY DAY--  TAKES IN PM * Needs to schedule a med follow up appointment with Dr Deborra Medina. 30 tablet 0  . HYDROcodone-acetaminophen (NORCO) 7.5-325 MG per tablet TAKE 1 TABLET BY MOUTH TWICE DAILY AS NEEDED FOR PAIN 60 tablet 0  . lisinopril (PRINIVIL,ZESTRIL) 40 MG tablet Take 40 mg by mouth every evening.    . meclizine (ANTIVERT) 25 MG tablet Take 25 mg by mouth as needed.    . Meth-Hyo-M Bl-Na Phos-Ph Sal (URIBEL) 118 MG CAPS Take by mouth as needed.    . traMADol (ULTRAM) 50 MG tablet TAKE 1 TABLET BY MOUTH EVERY 6 HOURS AS NEEDED FOR PAIN. NEEDS OFFICE VISIT 60 tablet 0   No current facility-administered medications on file  prior to visit.    Allergies  Allergen Reactions  . Allegra [Fexofenadine] Other (See Comments)    Makes pt nervous  . Codeine Nausea And Vomiting  . Prednisone     Causes elevation in blood pressure- ? All steroids cause same reaction  . Sulfa Antibiotics Rash    Past Medical History  Diagnosis Date  . Hyperlipidemia   . Hypertension   . Vertigo   . DDD (degenerative disc disease), lumbosacral   . Arthritis   . IC (interstitial cystitis)   . PONV (postoperative nausea and vomiting)   . Bladder pain   . History of colon polyps   . Uterine fibroid   . Wears glasses   . Wears partial dentures     Past Surgical History  Procedure Laterality Date  . Knee arthroscopy Right 11-18-2009  . Cysto/  hydrodistention/  instillation clorpactic  09-21-2010  . Dilation and curettage of uterus  1988  . Colonoscopy w/ polypectomy  2009  . Cysto with hydrodistension N/A 05/20/2014    Procedure: CYSTOSCOPY/HYDRODISTENSION, INSTILLATION OF CHLORPACTIN;  Surgeon: Bernestine Amass, MD;  Location: [facility];  Service: Urology;  Laterality: N/A;    Family History  Problem Relation Age of Onset  . COPD Father     History   Social History  . Marital Status: Married  Spouse Name: N/A  . Number of Children: N/A  . Years of Education: N/A   Occupational History  . Not on file.   Social History Main Topics  . Smoking status: Former Smoker    Types: Cigarettes    Quit date: 05/17/1971  . Smokeless tobacco: Never Used  . Alcohol Use: No  . Drug Use: No  . Sexual Activity: Not on file   Other Topics Concern  . Not on file   Social History Narrative   The PMH, PSH, Social History, Family History, Medications, and allergies have been reviewed in [facility], and have been updated if relevant.   Review of Systems  Constitutional: Negative.   HENT: Negative.   Respiratory: Negative.   Cardiovascular: Negative.   Gastrointestinal: Negative.   Endocrine: Negative.     Genitourinary: Negative.   Musculoskeletal: Positive for arthralgias.  Skin: Positive for rash.  Neurological: Negative.   Hematological: Negative.   Psychiatric/Behavioral: Negative.   All other systems reviewed and are negative.      Objective:    BP 130/88 mmHg  Pulse 55  Temp(Src) 97.9 F (36.6 C) (Oral)  Ht 5\' 5"  (1.651 m)  Wt 204 lb 1.9 oz (92.588 kg)  BMI 33.97 kg/m2  SpO2 98%   Physical Exam  Constitutional: She is oriented to person, place, and time. She appears well-developed and well-nourished. No distress.  HENT:  Head: Normocephalic and atraumatic.  Eyes: Conjunctivae are normal.  Neck: Normal range of motion.  Cardiovascular: Normal rate.   Pulmonary/Chest: Effort normal.  Musculoskeletal:       Right wrist: She exhibits normal range of motion, no bony tenderness, no swelling, no effusion and no deformity.  + phalen's - right, neg left  Neurological: She is alert and oriented to person, place, and time. No cranial nerve deficit.  Skin: Rash noted.     Psychiatric: She has a normal mood and affect. Her behavior is normal. Judgment and thought content normal.  Nursing note and vitals reviewed.         Assessment & Plan:   Carpal tunnel syndrome of right wrist - Plan: Ambulatory referral to Orthopedic Surgery  Eczema No Follow-up on file.

## 2014-09-19 NOTE — Patient Instructions (Addendum)
Good to see you. Please use lidex twice daily on your ankle for 10 days- call me if no improvement and at that point we will send you to a dermatologist.  Go to the drug store and ask for a carpal tunnel splint to wear at night.  You may stop by to see Rosaria Ferries on your way out to setup your orthopedist referral.

## 2014-09-20 ENCOUNTER — Other Ambulatory Visit: Payer: Self-pay | Admitting: Family Medicine

## 2014-09-24 ENCOUNTER — Other Ambulatory Visit: Payer: Self-pay

## 2014-09-24 MED ORDER — HYDROCODONE-ACETAMINOPHEN 7.5-325 MG PO TABS: ORAL_TABLET | ORAL | Status: DC

## 2014-09-24 NOTE — Telephone Encounter (Signed)
Pt left v/m requesting rx hydrocodone apap. Call when ready for pick up. Pt seen 09/19/14.

## 2014-09-25 DIAGNOSIS — G5602 Carpal tunnel syndrome, left upper limb: Secondary | ICD-10-CM | POA: Diagnosis not present

## 2014-09-25 DIAGNOSIS — G5601 Carpal tunnel syndrome, right upper limb: Secondary | ICD-10-CM | POA: Diagnosis not present

## 2014-09-25 NOTE — Telephone Encounter (Signed)
Lm on pts vm informing her Rx available for pickup from the front desk; pt advised third party unable to pickup

## 2014-10-09 DIAGNOSIS — G5601 Carpal tunnel syndrome, right upper limb: Secondary | ICD-10-CM | POA: Diagnosis not present

## 2014-10-22 ENCOUNTER — Other Ambulatory Visit: Payer: Self-pay | Admitting: Family Medicine

## 2014-10-22 NOTE — Telephone Encounter (Signed)
Rx called in to requested pharmacy 

## 2014-10-22 NOTE — Telephone Encounter (Signed)
Last f/u appt 10/2013 

## 2014-10-24 ENCOUNTER — Other Ambulatory Visit: Payer: Self-pay

## 2014-10-24 MED ORDER — HYDROCODONE-ACETAMINOPHEN 7.5-325 MG PO TABS: ORAL_TABLET | ORAL | Status: DC

## 2014-10-24 NOTE — Telephone Encounter (Signed)
Pt left v/m requesting rx hydrocodone apap. Call when ready for pick up.last f/u appt with Dr Deborra Medina 11/08/13.Please advise.

## 2014-10-24 NOTE — Telephone Encounter (Signed)
Lm on pts vm informing her Rx is available for pickup from the front desk;advised third party unable to pickup 

## 2014-10-25 ENCOUNTER — Encounter: Payer: Self-pay | Admitting: Family Medicine

## 2014-10-25 DIAGNOSIS — Z79899 Other long term (current) drug therapy: Secondary | ICD-10-CM | POA: Diagnosis not present

## 2014-10-25 DIAGNOSIS — Z79891 Long term (current) use of opiate analgesic: Secondary | ICD-10-CM | POA: Diagnosis not present

## 2014-10-25 DIAGNOSIS — G5601 Carpal tunnel syndrome, right upper limb: Secondary | ICD-10-CM | POA: Diagnosis not present

## 2014-11-05 ENCOUNTER — Other Ambulatory Visit: Payer: Self-pay | Admitting: Cardiovascular Disease

## 2014-11-07 DIAGNOSIS — M65311 Trigger thumb, right thumb: Secondary | ICD-10-CM | POA: Diagnosis not present

## 2014-11-15 ENCOUNTER — Other Ambulatory Visit: Payer: Self-pay

## 2014-11-15 MED ORDER — HYDROCODONE-ACETAMINOPHEN 7.5-325 MG PO TABS: ORAL_TABLET | ORAL | Status: DC

## 2014-11-15 NOTE — Telephone Encounter (Signed)
Pt left v/m requesting rx hydrocodone apap. Call when ready for pick up. Pt said calling early because has taken extra pills due to carpal tunnel surgery. rx last printed 10/24/14; pt has 4 pills left/ pt last seen 09/19/14.

## 2014-11-15 NOTE — Telephone Encounter (Signed)
Spoke to pt and informed her Rx is available for pickup and advised per Dr Silvio Pate. Pt verbally expressed understanding. Advised pt she may have to pay out of pocket for med as the sig has not been changed and she has reached quantity limits until 05/01. states that she may not pick up the Rx if it means that she will be in breech of contract

## 2014-11-15 NOTE — Telephone Encounter (Signed)
Please let her know that this is a violation of the controlled substances policy which she just signed.  Since she had surgery, she should have gotten pain meds from the surgeon and let us know about that (or called at the time of the surgery with written instructions from the surgeon) I will fill an emergency fill today and let Dr Deborra Medina decide how to proceed after that.

## 2014-11-18 NOTE — Telephone Encounter (Signed)
Pt left v/m and request cb about controlled substance contract and pick ing up the pain med.

## 2014-11-18 NOTE — Telephone Encounter (Signed)
Spoke to pt who states she did not pick up Rx but may contact her surgeon to see if they can be of any assistance. I also advised pt to try to take norco when having severe pain and take her normal Tramadol Rx between to take the edge off of the pain. Pt agreed and states this may work well, and help prevent her from taking all norco before Rx is due again 11/24/14

## 2014-11-20 ENCOUNTER — Other Ambulatory Visit: Payer: Self-pay | Admitting: *Deleted

## 2014-11-20 ENCOUNTER — Encounter: Payer: Self-pay | Admitting: Family Medicine

## 2014-11-20 NOTE — Telephone Encounter (Signed)
Ok to print rx and put on my desk.

## 2014-11-20 NOTE — Telephone Encounter (Signed)
OV 09/19/14.  Last filled #15 11/15/14.  Patient is going out of town this weekend and will run out of medication while gone.  She was previously told that she could not refill until 05/01 or later.  Patient is requesting to pick up a new Rx to be filled on or after that date.  Please advise.

## 2014-11-21 MED ORDER — HYDROCODONE-ACETAMINOPHEN 7.5-325 MG PO TABS: ORAL_TABLET | ORAL | Status: DC

## 2014-11-21 NOTE — Telephone Encounter (Signed)
Spoke to pt and informed her Rx is available for pickup from the front desk 

## 2014-11-23 IMAGING — CT CT ABD-PELV W/ CM
1 of 2 series · 15 of 32 positions shown, 19 images · IV contrast (omnipaque)
Comparison: Abdomen and pelvis CT dated 01/22/2005 and abdomen and
pelvis MR dated 09/01/2010.

CLINICAL DATA: Diffuse lower abdominal pain, more pronounced in the
left lower quadrant. The patient has had pain for years, worse over
the past few weeks.

EXAM:
CT ABDOMEN AND PELVIS WITH CONTRAST
TECHNIQUE: Multidetector CT imaging of the abdomen and pelvis was performed
using the standard protocol following bolus administration of
intravenous contrast.
CONTRAST:  100mL OMNIPAQUE IOHEXOL 300 MG/ML  SOLN

[Series 4: routine abdomen/pelvis with · axial · 0.88mm/px · z∈[-602,-192]mm · 15 of 90 slices shown, 19 images]
[im 4/90  soft-tissue]
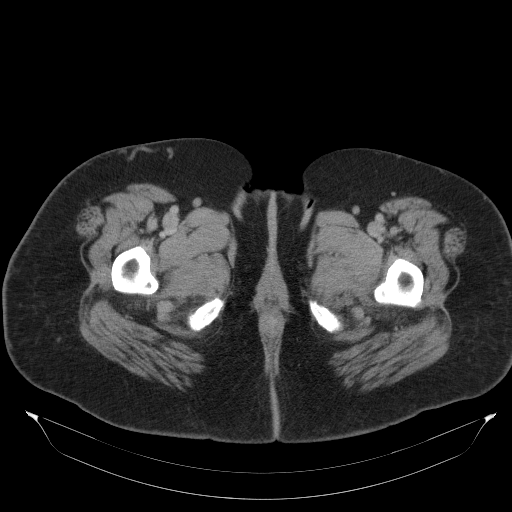
[im 4/90  bone]
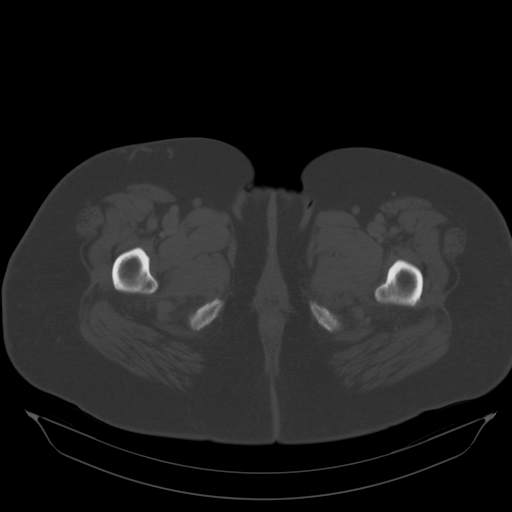
[im 11/90  soft-tissue]
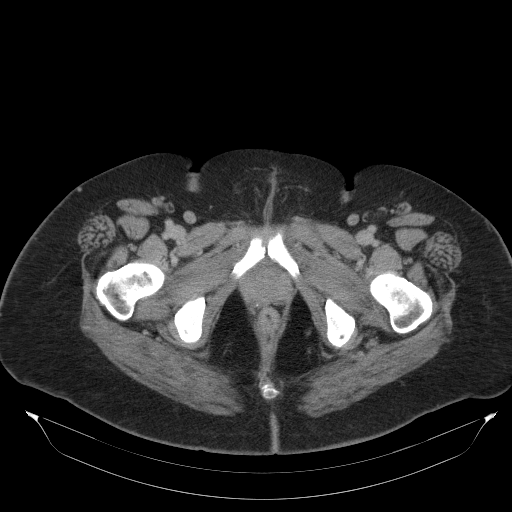
[im 18/90  soft-tissue]
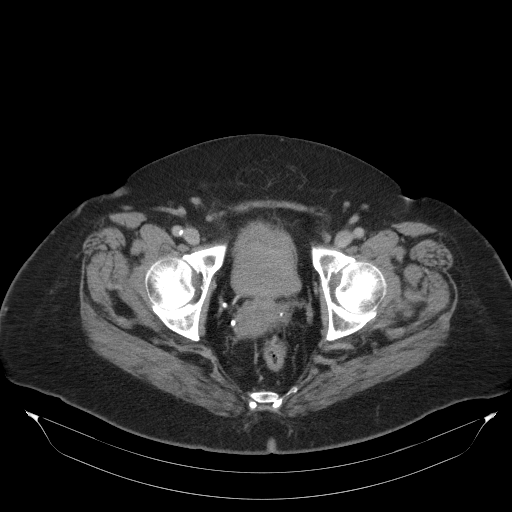
[im 24/90  soft-tissue]
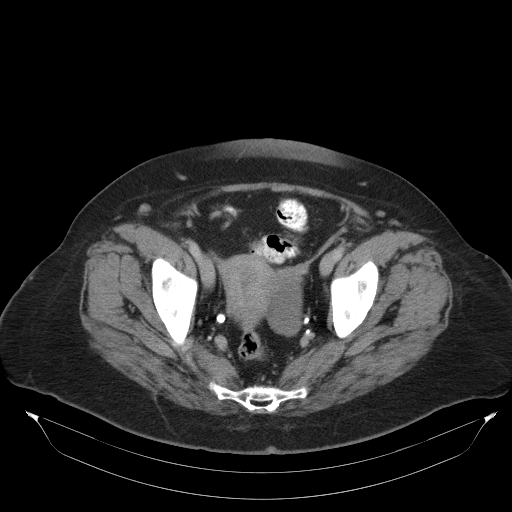
[im 31/90  soft-tissue]
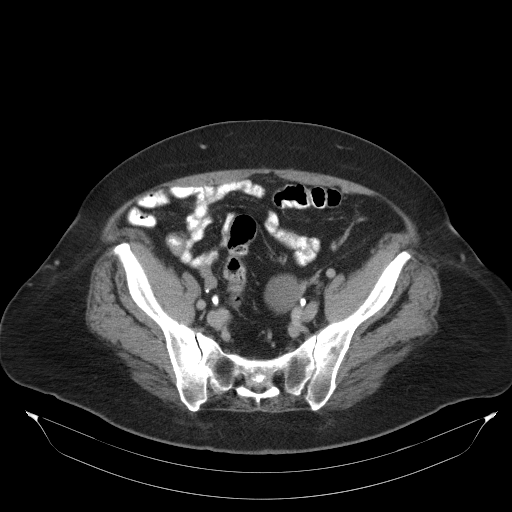
[im 38/90  soft-tissue]
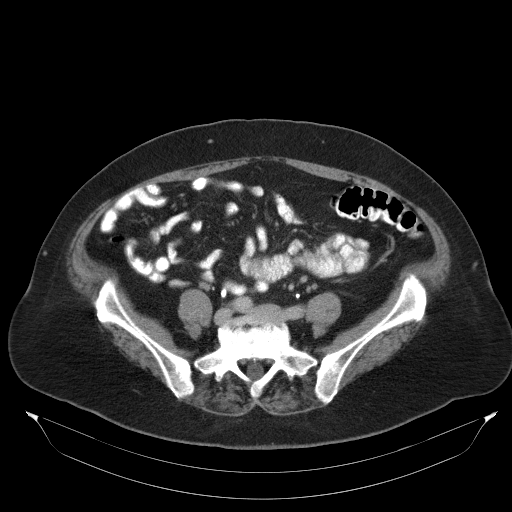
[im 45/90  soft-tissue]
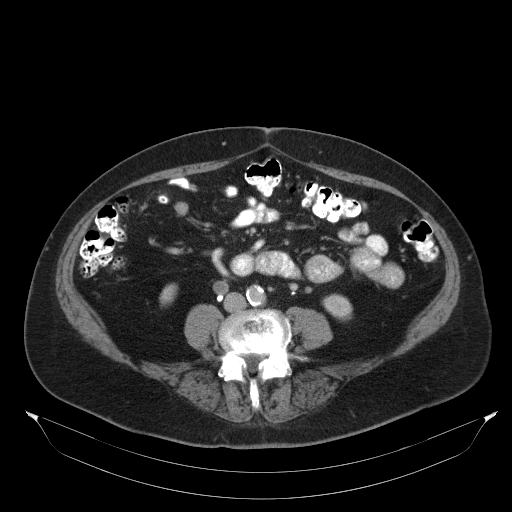
[im 52/90  soft-tissue]
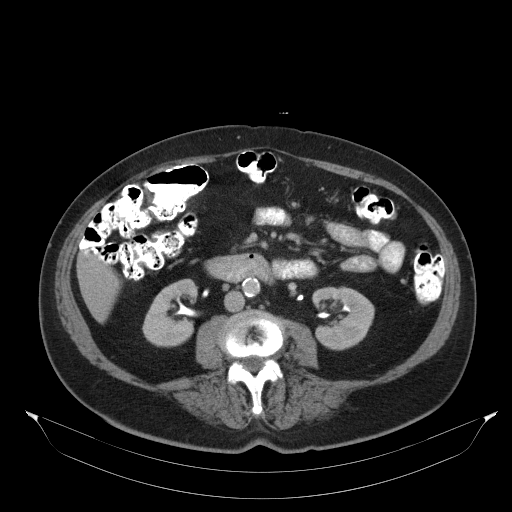
[im 59/90  soft-tissue]
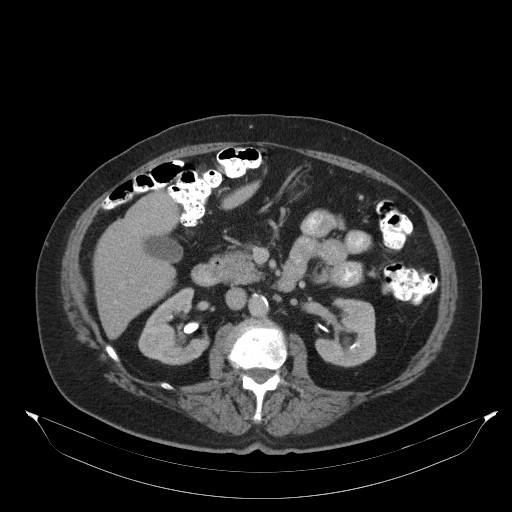
[im 59/90  bone]
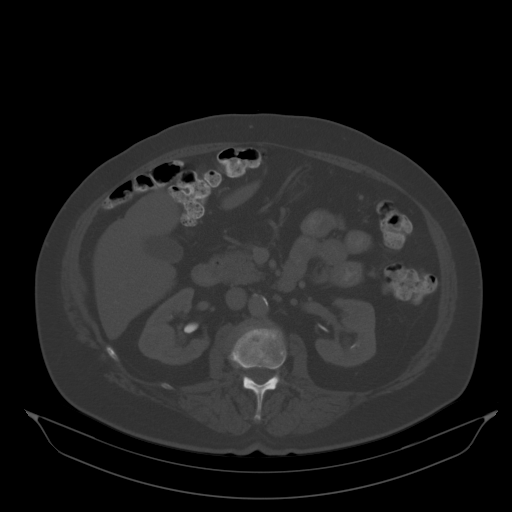
[im 66/90  soft-tissue]
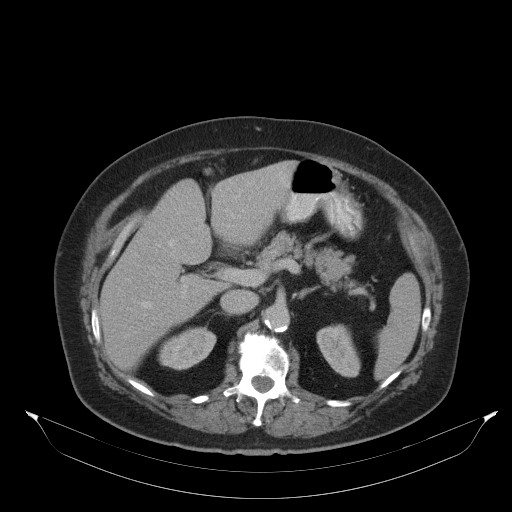
[im 72/90  soft-tissue]
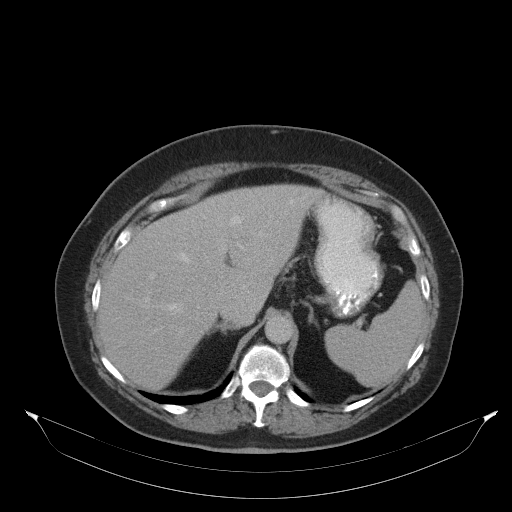
[im 76/90  lung]
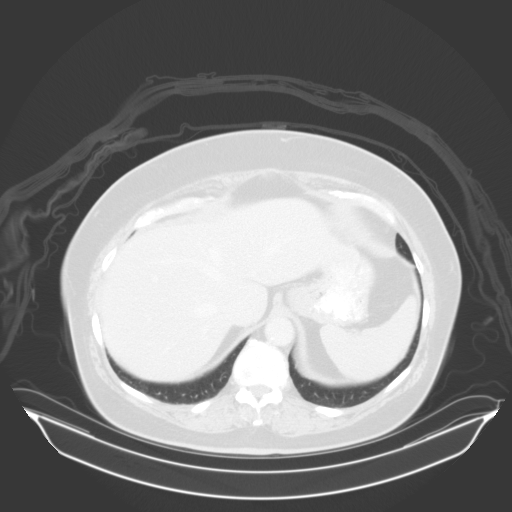
[im 79/90  soft-tissue]
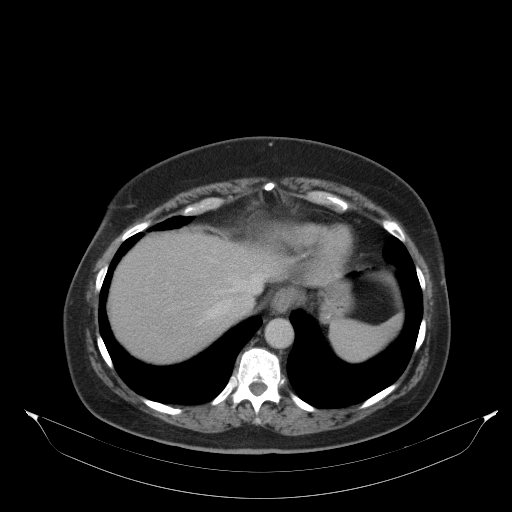
[im 79/90  lung]
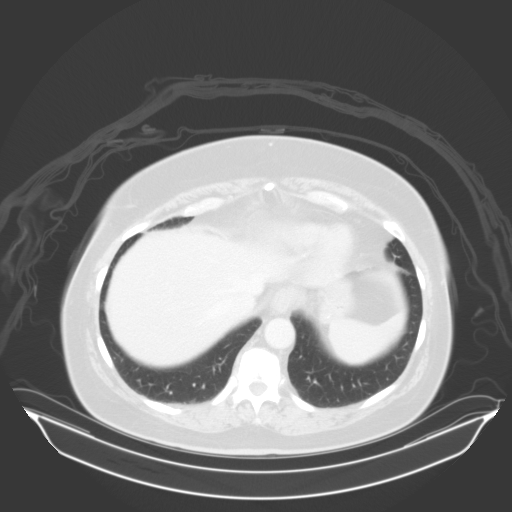
[im 83/90  lung]
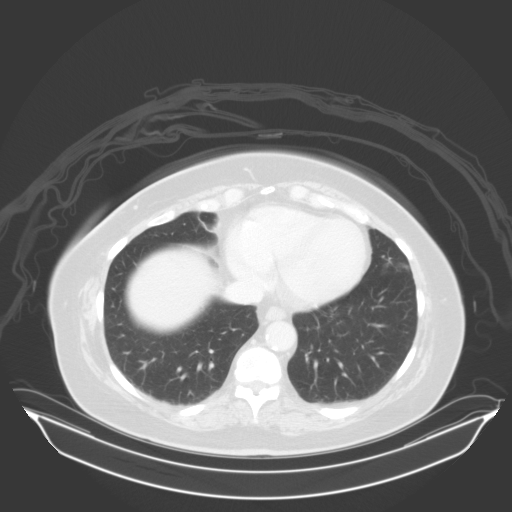
[im 86/90  soft-tissue]
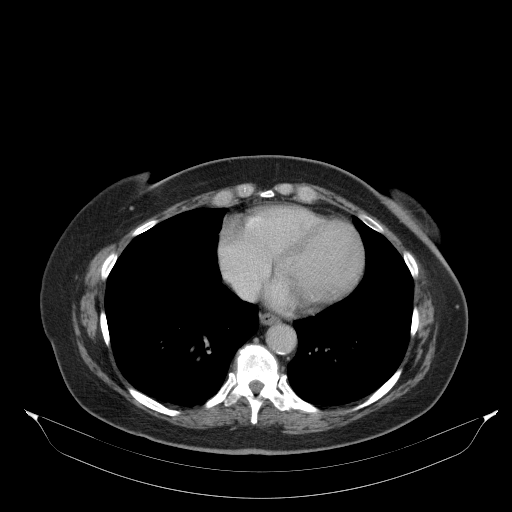
[im 86/90  lung]
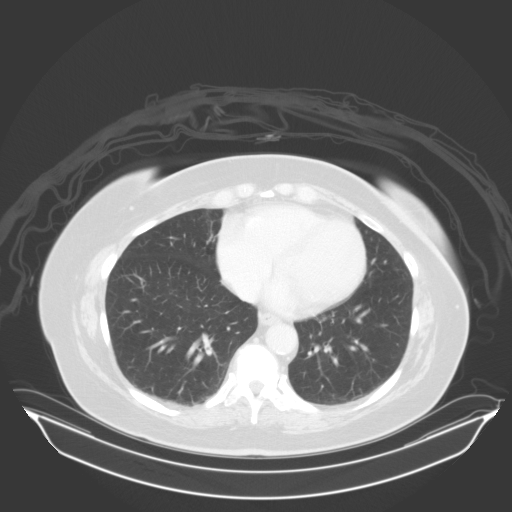

[15 of 32 positions shown; findings below may reference images not displayed]

FINDINGS: Normal appearing liver, spleen, gallbladder common adrenal glands,
kidneys and right ovary. No gross change in a small posterior
uterine mass on the left. Interval enlargement of the left ovary,
currently measuring 7.0 x 4.0 cm on image number 65 of series 4. No
discrete mass or cyst visualized.

A small rounded area of fat density in the pancreatic tail is
unchanged. No gastrointestinal abnormalities or enlarged lymph
nodes. Normal appearing appendix. Tiny amount of free peritoneal
fluid in the inferior pelvis. Atheromatous arterial calcifications.
Stable mild linear scarring at the lung bases. Lumbar lower thoracic
spine degenerative changes.
IMPRESSION: 1. Interval diffuse enlargement of the left ovary. Further
evaluation with pelvic ultrasound is recommended.
2. Grossly stable small left posterior uterine fibroid.
3. Tiny amount of free peritoneal fluid.

## 2014-12-02 NOTE — Progress Notes (Signed)
Patient ID: Heather Shaw, female   DOB: [birth year], 74 y.o.   MRN: [id number]   74 y.o.  referred by primary for HTN. Last year  At that time was seen in ER at [facility] and given PRN clonidine which probably not helping fluctuations. She is compliant with her lisinopril and has been on diuretic for years. No unusual stress but has a white coat component. Has an [place] BP cuff and most readings at home are high. Seems real nervous. No history of chronic kidney disease. Some salt in diet. No stimulants or cold meds. Denies ETOH and quit smoking over 25 years ago. No SSCP, palpitations or syncope Her daughters husband died and husband had a ruptured colon so her stress levels have been high  Husband sees [facility] and has a large post colostomy hernia that she wants fixed but he won't do it  BP has been good at home  ROS: Denies fever, malais, weight loss, blurry vision, decreased visual acuity, cough, sputum, SOB, hemoptysis, pleuritic pain, palpitaitons, heartburn, abdominal pain, melena, lower extremity edema, claudication, or rash.  All other systems reviewed and negative  General: Affect appropriate Healthy:  appears stated age 79: normal Neck supple with no adenopathy JVP normal no bruits no thyromegaly Lungs clear with no wheezing and good diaphragmatic motion Heart:  S1/S2 no murmur, no rub, gallop or click PMI normal Abdomen: benighn, BS positve, no tenderness, no AAA no bruit.  No HSM or HJR Distal pulses intact with no bruits No edema Neuro non-focal Skin warm and dry psoriatic type rash on left ankle  No muscular weakness   Current Outpatient Prescriptions  Medication Sig Dispense Refill  . acetaminophen (TYLENOL) 500 MG tablet Take 1,000 mg by mouth as needed.     Marland Kitchen atenolol (TENORMIN) 25 MG tablet TAKE 1 TABLET BY MOUTH ONCE DAILY 30 tablet 0  . fluocinonide-emollient (LIDEX-E) 0.05 % cream Apply 1 application topically 2 (two) times daily. 30 g 0  .  hydrochlorothiazide (HYDRODIURIL) 25 MG tablet TAKE 1 TABLET BY MOUTH EVERY DAY--  TAKES IN PM * Needs to schedule a med follow up appointment with Dr Deborra Medina. 30 tablet 0  . HYDROcodone-acetaminophen (NORCO) 7.5-325 MG per tablet TAKE 1 TABLET BY MOUTH TWICE DAILY AS NEEDED FOR PAIN 60 tablet 0  . lisinopril (PRINIVIL,ZESTRIL) 40 MG tablet Take 40 mg by mouth every evening.    . meclizine (ANTIVERT) 25 MG tablet Take 25 mg by mouth as needed.    . Meth-Hyo-M Bl-Na Phos-Ph Sal (URIBEL) 118 MG CAPS Take by mouth as needed.    . traMADol (ULTRAM) 50 MG tablet TAKE 1 TABLET BY MOUTH EVERY 6 HOURS AS NEEDED FOR PAIN. PLEASE MAKE APPOINTMENT FOR OFFICE VISIT 60 tablet 0   No current facility-administered medications for this visit.    Allergies  Allegra; Codeine; Prednisone; and Sulfa antibiotics  Electrocardiogram: 08/18/12 SR rate 68 LAD nonspecific ST changes  05/20/14   NSR ate 90 normal ECG   Assessment and Plan HTN:  Well controlled discussed low sodium DASH type diet can f/u with primary for this  Rash:  On left ankle not resolved with lidex  Consider referral to dermatology

## 2014-12-04 ENCOUNTER — Encounter: Payer: Self-pay | Admitting: Cardiovascular Disease

## 2014-12-04 ENCOUNTER — Ambulatory Visit (INDEPENDENT_AMBULATORY_CARE_PROVIDER_SITE_OTHER): Payer: Medicare Other | Admitting: Cardiovascular Disease

## 2014-12-04 VITALS — BP 128/70 | HR 53 | Ht 65.0 in | Wt 205.8 lb

## 2014-12-04 DIAGNOSIS — I10 Essential (primary) hypertension: Secondary | ICD-10-CM | POA: Diagnosis not present

## 2014-12-04 NOTE — Patient Instructions (Signed)
Medication Instructions:  NO CHANGES  Labwork: NONE  Testing/Procedures: NONE  Follow-Up: AS NEEDED  Any Other Special Instructions Will Be Listed Below (If Applicable).

## 2014-12-05 ENCOUNTER — Other Ambulatory Visit: Payer: Self-pay | Admitting: Cardiovascular Disease

## 2014-12-10 ENCOUNTER — Other Ambulatory Visit: Payer: Self-pay | Admitting: Family Medicine

## 2014-12-10 DIAGNOSIS — M65311 Trigger thumb, right thumb: Secondary | ICD-10-CM | POA: Diagnosis not present

## 2014-12-10 DIAGNOSIS — G5601 Carpal tunnel syndrome, right upper limb: Secondary | ICD-10-CM | POA: Diagnosis not present

## 2014-12-10 NOTE — Telephone Encounter (Signed)
Pt has not had any recent f/u appt

## 2014-12-10 NOTE — Telephone Encounter (Signed)
Rx called in to requested pharmacy 

## 2014-12-11 DIAGNOSIS — N3010 Interstitial cystitis (chronic) without hematuria: Secondary | ICD-10-CM | POA: Diagnosis not present

## 2014-12-11 DIAGNOSIS — R102 Pelvic and perineal pain: Secondary | ICD-10-CM | POA: Diagnosis not present

## 2014-12-20 ENCOUNTER — Other Ambulatory Visit: Payer: Self-pay | Admitting: Family Medicine

## 2014-12-25 ENCOUNTER — Other Ambulatory Visit: Payer: Self-pay

## 2014-12-25 MED ORDER — HYDROCODONE-ACETAMINOPHEN 7.5-325 MG PO TABS: ORAL_TABLET | ORAL | Status: DC

## 2014-12-25 NOTE — Telephone Encounter (Signed)
Pt left v/m requesting rx hydrocodone apap. Call when ready for pick up.rx last printed 11/21/14 for # 60. Seen 03/13/14 for back pain f/u.

## 2014-12-25 NOTE — Telephone Encounter (Signed)
Lm on pts vm informing her Rx is available for pickup from the front desk 

## 2014-12-30 DIAGNOSIS — H2513 Age-related nuclear cataract, bilateral: Secondary | ICD-10-CM | POA: Diagnosis not present

## 2015-01-02 ENCOUNTER — Other Ambulatory Visit: Payer: Self-pay | Admitting: Family Medicine

## 2015-01-02 NOTE — Telephone Encounter (Signed)
Pt request refill lisinopril to [facility]. Dr Johnsie Cancel had filled previously but pt does not have to go back to Dr Johnsie Cancel unless pt has a problem and request Dr Deborra Medina to begin to refill lisinopril. Last f/u appt 11/08/13 and last sick visit 09/19/14; no future appt scheduled.Please advise.

## 2015-01-21 ENCOUNTER — Other Ambulatory Visit: Payer: Self-pay | Admitting: Family Medicine

## 2015-01-21 NOTE — Telephone Encounter (Signed)
Ok to refill enough until she can get an appointment only.

## 2015-01-21 NOTE — Telephone Encounter (Signed)
Last Rx indicates f/u appt required. pls advise

## 2015-01-22 NOTE — Telephone Encounter (Signed)
Spoke to pt and advised. Medication filled to last until 07/11 appt

## 2015-01-24 ENCOUNTER — Other Ambulatory Visit: Payer: Self-pay

## 2015-01-24 NOTE — Telephone Encounter (Signed)
Pt left v/m requesting rx hydrocodone apap. Call when ready for pick up. rx last printed # 60 on 12/25/2014; last F/U appt on 03/13/14 and has f/u appt on 02/03/15 with Dr Deborra Medina.Please advise.

## 2015-01-27 MED ORDER — HYDROCODONE-ACETAMINOPHEN 7.5-325 MG PO TABS: ORAL_TABLET | ORAL | Status: DC

## 2015-01-27 NOTE — Telephone Encounter (Signed)
Printed.  Thanks.  

## 2015-01-28 NOTE — Telephone Encounter (Signed)
Patient advised.  Rx left at front desk for pick up. 

## 2015-02-03 ENCOUNTER — Ambulatory Visit: Payer: No Typology Code available for payment source | Admitting: Family Medicine

## 2015-02-10 ENCOUNTER — Ambulatory Visit: Payer: Medicare Other | Admitting: Family Medicine

## 2015-02-12 ENCOUNTER — Telehealth: Payer: Self-pay

## 2015-02-12 ENCOUNTER — Encounter: Payer: Self-pay | Admitting: Family Medicine

## 2015-02-12 ENCOUNTER — Ambulatory Visit (INDEPENDENT_AMBULATORY_CARE_PROVIDER_SITE_OTHER): Payer: Medicare Other | Admitting: Family Medicine

## 2015-02-12 VITALS — BP 130/78 | HR 51 | Temp 98.1°F | Wt 208.0 lb

## 2015-02-12 DIAGNOSIS — E785 Hyperlipidemia, unspecified: Secondary | ICD-10-CM

## 2015-02-12 DIAGNOSIS — G4700 Insomnia, unspecified: Secondary | ICD-10-CM

## 2015-02-12 DIAGNOSIS — I10 Essential (primary) hypertension: Secondary | ICD-10-CM

## 2015-02-12 DIAGNOSIS — M5416 Radiculopathy, lumbar region: Secondary | ICD-10-CM | POA: Diagnosis not present

## 2015-02-12 LAB — CBC WITH DIFFERENTIAL/PLATELET
Basophils Absolute: 0 10*3/uL (ref 0.0–0.1)
Basophils Relative: 0.6 % (ref 0.0–3.0)
EOS PCT: 6.6 % — AB (ref 0.0–5.0)
Eosinophils Absolute: 0.5 10*3/uL (ref 0.0–0.7)
HCT: 37.3 % (ref 36.0–46.0)
Hemoglobin: 12.7 g/dL (ref 12.0–15.0)
LYMPHS ABS: 3 10*3/uL (ref 0.7–4.0)
Lymphocytes Relative: 43.9 % (ref 12.0–46.0)
MCHC: 33.9 g/dL (ref 30.0–36.0)
MCV: 85.7 fl (ref 78.0–100.0)
Monocytes Absolute: 0.4 10*3/uL (ref 0.1–1.0)
Monocytes Relative: 6.2 % (ref 3.0–12.0)
NEUTROS PCT: 42.7 % — AB (ref 43.0–77.0)
Neutro Abs: 2.9 10*3/uL (ref 1.4–7.7)
Platelets: 201 10*3/uL (ref 150.0–400.0)
RBC: 4.35 Mil/uL (ref 3.87–5.11)
RDW: 13.5 % (ref 11.5–15.5)
WBC: 6.9 10*3/uL (ref 4.0–10.5)

## 2015-02-12 LAB — COMPREHENSIVE METABOLIC PANEL
ALBUMIN: 3.8 g/dL (ref 3.5–5.2)
ALK PHOS: 45 U/L (ref 39–117)
ALT: 10 U/L (ref 0–35)
AST: 15 U/L (ref 0–37)
BILIRUBIN TOTAL: 0.2 mg/dL (ref 0.2–1.2)
BUN: 16 mg/dL (ref 6–23)
CALCIUM: 9.3 mg/dL (ref 8.4–10.5)
CO2: 30 mEq/L (ref 19–32)
Chloride: 104 mEq/L (ref 96–112)
Creatinine, Ser: 0.74 mg/dL (ref 0.40–1.20)
GFR: 81.98 mL/min (ref >60.00)
GLUCOSE: 96 mg/dL (ref 70–99)
POTASSIUM: 3.9 meq/L (ref 3.5–5.1)
Sodium: 139 mEq/L (ref 135–145)
TOTAL PROTEIN: 6.3 g/dL (ref 6.0–8.3)

## 2015-02-12 LAB — LIPID PANEL
Cholesterol: 182 mg/dL (ref 0–200)
HDL: 33.5 mg/dL — AB (ref >39.00)
NONHDL: 148.5
TRIGLYCERIDES: 251 mg/dL — AB (ref 0.0–149.0)
Total CHOL/HDL Ratio: 5
VLDL: 50.2 mg/dL — AB (ref 0.0–40.0)

## 2015-02-12 LAB — LDL CHOLESTEROL, DIRECT: Direct LDL: 119 mg/dL

## 2015-02-12 LAB — TSH: TSH: 1.57 u[IU]/mL (ref 0.35–4.50)

## 2015-02-12 MED ORDER — TRAMADOL HCL 50 MG PO TABS: ORAL_TABLET | ORAL | Status: DC

## 2015-02-12 MED ORDER — MECLIZINE HCL 12.5 MG PO TABS: 12.5000 mg | ORAL_TABLET | Freq: Three times a day (TID) | ORAL | Status: DC | PRN

## 2015-02-12 NOTE — Assessment & Plan Note (Signed)
Well controlled on current rx.

## 2015-02-12 NOTE — Assessment & Plan Note (Signed)
On chronic pain rx. Rx refilled.  Orders Placed This Encounter  Procedures  . CBC with Differential/Platelet  . Comprehensive metabolic panel  . Lipid panel  . TSH

## 2015-02-12 NOTE — Progress Notes (Signed)
74 yo pleasant female here for follow up.  HTN- on atenolol 25 mg daily, HCTZ 25 mg daily, Lisinopril 40 mg daily.  Sees Dr. Johnsie Cancel.   No CP, SOB, HA or blurred vision.  Due for labs. Lab Results  Component Value Date   CREATININE 0.60 05/20/2014    Hyperlipidemia- was on Lipitor. Gave her myalgias and nausea.    Lab Results  Component Value Date   CHOL 178 11/08/2013   HDL 34.70* 11/08/2013   LDLCALC 122* 11/08/2013   LDLDIRECT 132.8 05/24/2012   TRIG 106.0 11/08/2013   CHOLHDL 5 11/08/2013    IC- followed by Dr. Risa Grill.  Symptoms are controlled with Uribel.  Insomnia- stopped taking Lunesta and Gapapentin.  Sleeping fine now unless her knee pain wakes her up and she takes a Norco and goes back to sleep.  Chronic pain- hip, back (DDD) and right knee. Has seen multiple orthopedists.   Take tramadol and Norco. Tries not to take Norco except at night.   Wt Readings from Last 3 Encounters:  02/12/15 208 lb (94.348 kg)  12/04/14 205 lb 12.8 oz (93.35 kg)  09/19/14 204 lb 1.9 oz (92.588 kg)    Patient Active Problem List   Diagnosis Date Noted  . Carpal tunnel syndrome 09/19/2014  . Eczema 09/19/2014  . Chronic interstitial cystitis 05/20/2014  . Lumbar radiculopathy, chronic 12/31/2013  . DDD (degenerative disc disease), lumbosacral 04/07/2011  . Insomnia 04/06/2010  . VITAMIN D DEFICIENCY 10/03/2009  . HLD (hyperlipidemia) 07/16/2009  . Essential hypertension 07/16/2009   Past Medical History  Diagnosis Date  . Hyperlipidemia   . Hypertension   . Vertigo   . DDD (degenerative disc disease), lumbosacral   . Arthritis   . IC (interstitial cystitis)   . PONV (postoperative nausea and vomiting)   . Bladder pain   . History of colon polyps   . Uterine fibroid   . Wears glasses   . Wears partial dentures    Past Surgical History  Procedure Laterality Date  . Knee arthroscopy Right 11-18-2009  . Cysto/  hydrodistention/  instillation clorpactic  09-21-2010  .  Dilation and curettage of uterus  1988  . Colonoscopy w/ polypectomy  2009  . Cysto with hydrodistension N/A 05/20/2014    Procedure: CYSTOSCOPY/HYDRODISTENSION, INSTILLATION OF CHLORPACTIN;  Surgeon: Bernestine Amass, MD;  Location: [facility];  Service: Urology;  Laterality: N/A;   History  Substance Use Topics  . Smoking status: Former Smoker    Types: Cigarettes    Quit date: 05/17/1971  . Smokeless tobacco: Never Used  . Alcohol Use: No   Family History  Problem Relation Age of Onset  . COPD Father    Allergies  Allergen Reactions  . Allegra [Fexofenadine] Other (See Comments)    Makes pt nervous  . Codeine Nausea And Vomiting  . Prednisone     Causes elevation in blood pressure- ? All steroids cause same reaction  . Sulfa Antibiotics Rash   Current Outpatient Prescriptions on File Prior to Visit  Medication Sig Dispense Refill  . acetaminophen (TYLENOL) 500 MG tablet Take 1,000 mg by mouth as needed.     Marland Kitchen atenolol (TENORMIN) 25 MG tablet TAKE 1 TABLET BY MOUTH EVERY DAY 30 tablet 11  . fluocinonide-emollient (LIDEX-E) 0.05 % cream Apply 1 application topically 2 (two) times daily. 30 g 0  . hydrochlorothiazide (HYDRODIURIL) 25 MG tablet TAKE 1 TABLET BY MOUTH EVERY DAY 30 tablet 2  . HYDROcodone-acetaminophen (NORCO) 7.5-325 MG  per tablet TAKE 1 TABLET BY MOUTH TWICE DAILY AS NEEDED FOR PAIN 60 tablet 0  . lisinopril (PRINIVIL,ZESTRIL) 40 MG tablet TAKE 1 TABLET BY MOUTH EVERY DAY 30 tablet 3  . meclizine (ANTIVERT) 25 MG tablet Take 25 mg by mouth as needed.    . Meth-Hyo-M Bl-Na Phos-Ph Sal (URIBEL) 118 MG CAPS Take by mouth as needed.    . traMADol (ULTRAM) 50 MG tablet TAKE 1 TABLET BY MOUTH EVERY 6 HOURS AS NEEDED FOR PAIN **MUST MAKE APPOINTMENT FOR REFILLS** 20 tablet 0   No current facility-administered medications on file prior to visit.     The PMH, PSH, Social History, Family History, Medications, and allergies have been reviewed in [facility], and  have been updated if relevant.  Review of Systems  Constitutional: Negative.   HENT: Negative.   Respiratory: Negative.   Cardiovascular: Negative.   Gastrointestinal: Negative.   Endocrine: Negative.   Genitourinary: Negative.   Musculoskeletal: Positive for back pain and arthralgias.  Allergic/Immunologic: Negative.   Neurological: Negative.   Hematological: Negative.   Psychiatric/Behavioral: Negative.   All other systems reviewed and are negative.     Physical Exam BP 130/78 mmHg  Pulse 51  Temp(Src) 98.1 F (36.7 C) (Oral)  Wt 208 lb (94.348 kg)  SpO2 97%  General:  Well-developed,well-nourished,in no acute distress; alert,appropriate and cooperative throughout examination Ears:  R ear normal and L ear normal.   Mouth:  Oral mucosa and oropharynx without lesions or exudates.  Teeth in good repair. Lungs:  Normal respiratory effort, chest expands symmetrically. Lungs are clear to auscultation, no crackles or wheezes. Heart:  Normal rate and regular rhythm. S1 and S2 normal without gallop, murmur, click, rub or other extra sounds. Abdomen:  Bowel sounds positive,abdomen soft and non-tender without masses, organomegaly or hernias noted. Extremities:  No clubbing, cyanosis, edema, or deformity noted with normal full range of motion of all joints.   Skin:  Intact without suspicious lesions or rashes Psych:  Cognition and judgment appear intact. Alert and cooperative with normal attention span and concentration. No apparent delusions, illusions, hallucinations

## 2015-02-12 NOTE — Assessment & Plan Note (Signed)
Overdue for labs

## 2015-02-12 NOTE — Telephone Encounter (Signed)
Can we call pharmacy to change quantity to 60 please?

## 2015-02-12 NOTE — Telephone Encounter (Signed)
Spoke to pt and informed her she must first return original Rx in order to get new one, since she received a hard copy at [facility]

## 2015-02-12 NOTE — Telephone Encounter (Signed)
Pt left v/m; pt was seen earlier today and pt told Dr Deborra Medina incorrectly that pt usually gets # 30 for tramadol; pt actuallly gets # 50. Pt still has prescription and wants to know if needs to come back to [facility] for new rx or can med be called in as quantity of # 42. Pt request cb.[facility].

## 2015-02-12 NOTE — Progress Notes (Signed)
Pre visit review using our clinic review tool, if applicable. No additional management support is needed unless otherwise documented below in the visit note. 

## 2015-02-13 ENCOUNTER — Telehealth: Payer: Self-pay | Admitting: Family Medicine

## 2015-02-13 NOTE — Telephone Encounter (Signed)
Pt came by to change out rx for tramadol from qty 30 to qty 41. Pt will bring in script on Monday. Call back number is [phone].

## 2015-02-14 NOTE — Telephone Encounter (Signed)
Noted. Route to [place].

## 2015-02-17 MED ORDER — TRAMADOL HCL 50 MG PO TABS: ORAL_TABLET | ORAL | Status: DC

## 2015-02-17 NOTE — Addendum Note (Signed)
Addended by: Modena Nunnery on: 02/17/2015 12:23 PM   Modules accepted: Orders

## 2015-02-26 ENCOUNTER — Other Ambulatory Visit: Payer: Self-pay

## 2015-02-26 NOTE — Telephone Encounter (Signed)
Pt left v/m requesting rx hydrocodone apap. Call when ready for pick up. rx last printed # 60 on 01/27/2015 and pt last seen 02/12/15.

## 2015-02-27 MED ORDER — HYDROCODONE-ACETAMINOPHEN 7.5-325 MG PO TABS: ORAL_TABLET | ORAL | Status: DC

## 2015-02-27 NOTE — Telephone Encounter (Signed)
Rx had to be printed and approved per Dr Lorelei Pont; Dr Deborra Medina out of office remainder of the day, left without signing

## 2015-02-27 NOTE — Addendum Note (Signed)
Addended by: Modena Nunnery on: 02/27/2015 04:24 PM   Modules accepted: Orders

## 2015-02-27 NOTE — Telephone Encounter (Signed)
Pt called for status of rx she would be in our area this afternoon and wanted to know if could pick up hydrocodone apap rx. Advised rx ready for pick up at front desk. Pt voiced understanding.

## 2015-03-10 ENCOUNTER — Ambulatory Visit (INDEPENDENT_AMBULATORY_CARE_PROVIDER_SITE_OTHER): Payer: Medicare Other | Admitting: Family Medicine

## 2015-03-10 ENCOUNTER — Encounter: Payer: Self-pay | Admitting: Family Medicine

## 2015-03-10 VITALS — BP 118/74 | HR 60 | Temp 98.2°F | Wt 205.5 lb

## 2015-03-10 DIAGNOSIS — S90212A Contusion of left great toe with damage to nail, initial encounter: Secondary | ICD-10-CM

## 2015-03-10 NOTE — Progress Notes (Signed)
Subjective:   Patient ID: Heather Shaw, female    DOB: 1941/05/23, 74 y.o.   MRN: [id number]  Heather Shaw is a pleasant 74 y.o. year old female who presents to clinic today with Foot Pain  on 03/10/2015  HPI: Dropped a piece of plywood on her left great toe 4 days ago.  Immediate swelling and blackening of toe.  Very painful but seems to be feeling a little better.  Has been soaking it.  Current Outpatient Prescriptions on File Prior to Visit  Medication Sig Dispense Refill  . acetaminophen (TYLENOL) 500 MG tablet Take 1,000 mg by mouth as needed.     Marland Kitchen atenolol (TENORMIN) 25 MG tablet TAKE 1 TABLET BY MOUTH EVERY DAY 30 tablet 11  . fluocinonide-emollient (LIDEX-E) 0.05 % cream Apply 1 application topically 2 (two) times daily. 30 g 0  . hydrochlorothiazide (HYDRODIURIL) 25 MG tablet TAKE 1 TABLET BY MOUTH EVERY DAY 30 tablet 2  . HYDROcodone-acetaminophen (NORCO) 7.5-325 MG per tablet TAKE 1 TABLET BY MOUTH TWICE DAILY AS NEEDED FOR PAIN 60 tablet 0  . lisinopril (PRINIVIL,ZESTRIL) 40 MG tablet TAKE 1 TABLET BY MOUTH EVERY DAY 30 tablet 3  . meclizine (ANTIVERT) 12.5 MG tablet Take 1 tablet (12.5 mg total) by mouth 3 (three) times daily as needed for dizziness. 30 tablet 0  . Meth-Hyo-M Bl-Na Phos-Ph Sal (URIBEL) 118 MG CAPS Take by mouth as needed.    . traMADol (ULTRAM) 50 MG tablet TAKE 1 TABLET BY MOUTH EVERY 6 HOURS AS NEEDED FOR PAIN 60 tablet 0   No current facility-administered medications on file prior to visit.    Allergies  Allergen Reactions  . Allegra [Fexofenadine] Other (See Comments)    Makes pt nervous  . Codeine Nausea And Vomiting  . Prednisone     Causes elevation in blood pressure- ? All steroids cause same reaction  . Sulfa Antibiotics Rash    Past Medical History  Diagnosis Date  . Hyperlipidemia   . Hypertension   . Vertigo   . DDD (degenerative disc disease), lumbosacral   . Arthritis   . IC (interstitial cystitis)   . PONV  (postoperative nausea and vomiting)   . Bladder pain   . History of colon polyps   . Uterine fibroid   . Wears glasses   . Wears partial dentures     Past Surgical History  Procedure Laterality Date  . Knee arthroscopy Right 11-18-2009  . Cysto/  hydrodistention/  instillation clorpactic  09-21-2010  . Dilation and curettage of uterus  1988  . Colonoscopy w/ polypectomy  2009  . Cysto with hydrodistension N/A 05/20/2014    Procedure: CYSTOSCOPY/HYDRODISTENSION, INSTILLATION OF CHLORPACTIN;  Surgeon: Bernestine Amass, MD;  Location: [facility];  Service: Urology;  Laterality: N/A;    Family History  Problem Relation Age of Onset  . COPD Father     Social History   Social History  . Marital Status: Married    Spouse Name: N/A  . Number of Children: N/A  . Years of Education: N/A   Occupational History  . Not on file.   Social History Main Topics  . Smoking status: Former Smoker    Types: Cigarettes    Quit date: 05/17/1971  . Smokeless tobacco: Never Used  . Alcohol Use: No  . Drug Use: No  . Sexual Activity: Not on file   Other Topics Concern  . Not on file   Social History Narrative  The PMH, PSH, Social History, Family History, Medications, and allergies have been reviewed in [facility], and have been updated if relevant.   Review of Systems  Constitutional: Negative.   Musculoskeletal: Negative for gait problem.  Allergic/Immunologic: Negative.   Neurological: Negative.   Hematological: Negative.   All other systems reviewed and are negative.      Objective:    BP 118/74 mmHg  Pulse 60  Temp(Src) 98.2 F (36.8 C) (Oral)  Wt 205 lb 8 oz (93.214 kg)   Physical Exam  Constitutional: She is oriented to person, place, and time. She appears well-developed and well-nourished. No distress.  HENT:  Head: Normocephalic.  Eyes: Conjunctivae are normal.  Cardiovascular: Normal rate.   Pulmonary/Chest: Effort normal.  Musculoskeletal:        Feet:  Neurological: She is alert and oriented to person, place, and time. No cranial nerve deficit.  Skin: Skin is warm and dry.  Psychiatric: She has a normal mood and affect. Her behavior is normal. Judgment and thought content normal.  Nursing note and vitals reviewed.         Assessment & Plan:   Subungual hematoma of great toe of left foot, initial encounter No Follow-up on file.

## 2015-03-10 NOTE — Progress Notes (Signed)
Pre visit review using our clinic review tool, if applicable. No additional management support is needed unless otherwise documented below in the visit note. 

## 2015-03-10 NOTE — Patient Instructions (Signed)
Subungual Hematoma  Subungual hematoma is more commonly known as tennis toe. It is the result of bleeding under the toenail because the nail separates from the nail bed. A blood clot (hematoma) forms under the nail that causes the nail to look "black and blue." SYMPTOMS   Pain under the toenail after exercise, occasionally very painful.  Black or dark blue color appears several days after the activity. When the black and blue appears, generally there is no pain.  Toenail may separate and fall off. CAUSES  Subungual hematomas are often the result of repetitive contact between shoes and the nail. RISK INCREASES WITH:   Shoes that fit poorly (particularly too tight).  Sports that allow for repeated pressure of the shoe against the toenail (tennis, jogging, snow skiing, and hiking). PREVENTION  Wear properly fitted shoes with enough room in the toe box.  Avoid activities that create constant pressure on toenails.  Relieve shoe pressure by stretching the areas of the shoe that cause the pressure. Use ointments to soften leather shoes.  Keep toenails trimmed. PROGNOSIS  If treated properly, subungual hematomas are typically curable in 1 to 2 weeks. RELATED COMPLICATIONS   Pain elsewhere from changing how you move your body (walking or throwing) to avoid the pain of continued irritation.  Loss of toenail.  Reoccurrence if cause is not prevented. TREATMENT Treatment initially requires stopping any activities that cause the symptoms to become more severe. It is important to remove the source of the problem if possible (wear shoes with a large toe box or trim toenails). Occasionally medicine may be given to reduce pain and inflammation. For severe cases, your caregiver may relieve pressure by removing the hematoma. This is done by drilling a needle through the toenail.  MEDICATION  If pain medicine is necessary, then nonsteroidal anti-inflammatory medicines, such as aspirin and ibuprofen,  or other minor pain relievers, such as acetaminophen, are often recommended.  Prescription pain relievers may be prescribed. Use only as directed and only as much as you need. SEEK MEDICAL CARE IF:   Symptoms get worse or do not improve in 2 weeks despite treatment.  Any signs of infection develop, including redness, swelling, increasing pain or tenderness, or increased warmth.  The black discoloration does not disappear as the nail grows back.  New, unexplained symptoms develop (drugs used in treatment may produce side effects). Document Released: 07/12/2005 Document Revised: 11/26/2013 Document Reviewed: 10/24/2008 [facility] Patient Information 2015 [place], [place]. This information is not intended to replace advice given to you by your health care provider. Make sure you discuss any questions you have with your health care provider.

## 2015-03-10 NOTE — Assessment & Plan Note (Signed)
New- advised supportive care. Unable to drain given how many days has passed since injury- hematoma has hardened. Advised toe nail may fall off. Call or return to clinic prn if these symptoms worsen or fail to improve as anticipated. The patient indicates understanding of these issues and agrees with the plan.

## 2015-03-22 ENCOUNTER — Other Ambulatory Visit: Payer: Self-pay | Admitting: Family Medicine

## 2015-04-01 ENCOUNTER — Other Ambulatory Visit: Payer: Self-pay

## 2015-04-01 MED ORDER — HYDROCODONE-ACETAMINOPHEN 7.5-325 MG PO TABS: ORAL_TABLET | ORAL | Status: DC

## 2015-04-01 NOTE — Telephone Encounter (Signed)
Lm on pts vm informing her Rx is available for pickup from the front desk 

## 2015-04-01 NOTE — Telephone Encounter (Signed)
Pt left v/m requesting rx hydrocodone apap. Call when ready for pick up. rx last printed # 60 on 02/27/15. Last seen f/u 02/12/15.

## 2015-04-07 ENCOUNTER — Other Ambulatory Visit: Payer: Self-pay | Admitting: Family Medicine

## 2015-04-09 ENCOUNTER — Other Ambulatory Visit: Payer: Self-pay | Admitting: Family Medicine

## 2015-04-09 NOTE — Telephone Encounter (Signed)
Last f/u appt 01/2015 

## 2015-04-09 NOTE — Telephone Encounter (Signed)
Rx called in to requested pharmacy 

## 2015-04-27 ENCOUNTER — Other Ambulatory Visit: Payer: Self-pay | Admitting: Family Medicine

## 2015-04-30 ENCOUNTER — Telehealth: Payer: Self-pay

## 2015-04-30 MED ORDER — HYDROCODONE-ACETAMINOPHEN 7.5-325 MG PO TABS: ORAL_TABLET | ORAL | Status: DC

## 2015-04-30 NOTE — Telephone Encounter (Signed)
Rx placed in Dr Hulen Shouts inbox for signature

## 2015-04-30 NOTE — Telephone Encounter (Signed)
Pt left v/m requesting rx hydrocodone apap. Call when ready for pick up. rx last printed # 60 on 04/01/15. Last seen f/u appt on 02/12/15.

## 2015-05-01 NOTE — Telephone Encounter (Signed)
Pt left v/m requesting cb with status of hydrocodone apap rx; pt would like to pick up today.

## 2015-05-01 NOTE — Telephone Encounter (Signed)
Lm on pts vm informing her Rx is available for pickup from the front desk; pt advised third party unable to pickup 

## 2015-05-02 ENCOUNTER — Encounter: Payer: Self-pay | Admitting: Family Medicine

## 2015-05-02 DIAGNOSIS — Z79891 Long term (current) use of opiate analgesic: Secondary | ICD-10-CM | POA: Diagnosis not present

## 2015-05-05 DIAGNOSIS — M542 Cervicalgia: Secondary | ICD-10-CM | POA: Diagnosis not present

## 2015-05-05 DIAGNOSIS — M4692 Unspecified inflammatory spondylopathy, cervical region: Secondary | ICD-10-CM | POA: Diagnosis not present

## 2015-05-07 DIAGNOSIS — M65311 Trigger thumb, right thumb: Secondary | ICD-10-CM | POA: Diagnosis not present

## 2015-05-14 DIAGNOSIS — M542 Cervicalgia: Secondary | ICD-10-CM | POA: Diagnosis not present

## 2015-05-16 DIAGNOSIS — M542 Cervicalgia: Secondary | ICD-10-CM | POA: Diagnosis not present

## 2015-05-20 ENCOUNTER — Other Ambulatory Visit: Payer: Self-pay | Admitting: Family Medicine

## 2015-05-20 DIAGNOSIS — M542 Cervicalgia: Secondary | ICD-10-CM | POA: Diagnosis not present

## 2015-05-20 NOTE — Telephone Encounter (Signed)
Last f/u 01/2015

## 2015-05-20 NOTE — Telephone Encounter (Signed)
Rx called in to requested pharmacy 

## 2015-05-22 DIAGNOSIS — M542 Cervicalgia: Secondary | ICD-10-CM | POA: Diagnosis not present

## 2015-05-29 ENCOUNTER — Encounter: Payer: Self-pay | Admitting: Family Medicine

## 2015-05-29 DIAGNOSIS — M542 Cervicalgia: Secondary | ICD-10-CM | POA: Diagnosis not present

## 2015-06-02 ENCOUNTER — Other Ambulatory Visit: Payer: Self-pay

## 2015-06-02 MED ORDER — HYDROCODONE-ACETAMINOPHEN 7.5-325 MG PO TABS: ORAL_TABLET | ORAL | Status: DC

## 2015-06-02 NOTE — Telephone Encounter (Signed)
Lm on pts vm informing her Rx is available for pickup from the front desk 

## 2015-06-02 NOTE — Telephone Encounter (Signed)
Pt left v/m requesting rx hydrocodone apap. Call when ready for pick up. Pt will be in [place] today at 4 pm and would like to pick up rx late today. rx last printed # 60 on 04/30/15. Last f/u appt 02/12/15.

## 2015-06-03 DIAGNOSIS — M542 Cervicalgia: Secondary | ICD-10-CM | POA: Diagnosis not present

## 2015-06-05 DIAGNOSIS — M542 Cervicalgia: Secondary | ICD-10-CM | POA: Diagnosis not present

## 2015-06-11 ENCOUNTER — Encounter: Payer: Self-pay | Admitting: Internal Medicine

## 2015-06-11 ENCOUNTER — Ambulatory Visit (INDEPENDENT_AMBULATORY_CARE_PROVIDER_SITE_OTHER): Payer: Medicare Other | Admitting: Internal Medicine

## 2015-06-11 VITALS — BP 128/72 | HR 55 | Temp 98.8°F | Wt 206.0 lb

## 2015-06-11 DIAGNOSIS — J069 Acute upper respiratory infection, unspecified: Secondary | ICD-10-CM

## 2015-06-11 DIAGNOSIS — R21 Rash and other nonspecific skin eruption: Secondary | ICD-10-CM

## 2015-06-11 NOTE — Progress Notes (Signed)
Pre visit review using our clinic review tool, if applicable. No additional management support is needed unless otherwise documented below in the visit note. 

## 2015-06-11 NOTE — Patient Instructions (Signed)

## 2015-06-11 NOTE — Progress Notes (Signed)
HPI  Pt presents to the clinic today with c/o runny nose, sore throat or cough. This started 3-4 days ago. She is blowing clear mucous of her nose. The cough is productive of yellow mucous. She denies difficulty swallowing. She denies fever, chills or body aches. She has tried some nasal spray with some relief. She denies history of allergies or breathing problems. She has had sick contacts.  She also c/o a rash to her left wrist. She noticed this yesterday. The rash is itchy. She has not been working in the yard. She has not tried anything OTC.  Review of Systems      Past Medical History  Diagnosis Date  . Hyperlipidemia   . Hypertension   . Vertigo   . DDD (degenerative disc disease), lumbosacral   . Arthritis   . IC (interstitial cystitis)   . PONV (postoperative nausea and vomiting)   . Bladder pain   . History of colon polyps   . Uterine fibroid   . Wears glasses   . Wears partial dentures     Family History  Problem Relation Age of Onset  . COPD Father     Social History   Social History  . Marital Status: Married    Spouse Name: N/A  . Number of Children: N/A  . Years of Education: N/A   Occupational History  . Not on file.   Social History Main Topics  . Smoking status: Former Smoker    Types: Cigarettes    Quit date: 05/17/1971  . Smokeless tobacco: Never Used  . Alcohol Use: No  . Drug Use: No  . Sexual Activity: Not on file   Other Topics Concern  . Not on file   Social History Narrative    Allergies  Allergen Reactions  . Allegra [Fexofenadine] Other (See Comments)    Makes pt nervous  . Codeine Nausea And Vomiting  . Prednisone     Causes elevation in blood pressure- ? All steroids cause same reaction  . Sulfa Antibiotics Rash     Constitutional: Denies headache, fatigue, fever or abrupt weight changes.  HEENT:  Positive sore throat. Denies eye redness, eye pain, pressure behind the eyes, facial pain, nasal congestion, ear pain,  ringing in the ears, wax buildup, runny nose or bloody nose. Respiratory: Positive cough. Denies difficulty breathing or shortness of breath.  Cardiovascular: Denies chest pain, chest tightness, palpitations or swelling in the hands or feet.  Skin: Pt reports rash. Denies ulceration.  No other specific complaints in a complete review of systems (except as listed in HPI above).  Objective:   BP 128/72 mmHg  Pulse 55  Temp(Src) 98.8 F (37.1 C) (Oral)  Wt 206 lb (93.441 kg)  SpO2 98% Wt Readings from Last 3 Encounters:  06/11/15 206 lb (93.441 kg)  03/10/15 205 lb 8 oz (93.214 kg)  02/12/15 208 lb (94.348 kg)     General: Appears her stated age, well developed, well nourished in NAD. HEENT: Head: normal shape and size, no sinus tenderness noted; Eyes: sclera white, no icterus, conjunctiva pink; Ears: Tm's gray and intact, normal light reflex; Nose: mucosa pink and moist, septum midline; Throat/Mouth:  Teeth present, mucosa pink and moist, no exudate noted, no lesions or ulcerations noted.  Neck: No cervical lymphadenopathy.  Cardiovascular: Normal rate and rhythm. S1,S2 noted.  No murmur, rubs or gallops noted.  Pulmonary/Chest: Normal effort and positive vesicular breath sounds. No respiratory distress. No wheezes, rales or ronchi noted.  Skin: Linear  macular rash noted on posterior wrist.    Assessment & Plan:   Upper Respiratory Infection:  Viral Get some rest and drink plenty of water Do salt water gargles for the sore throat Mucinex and Ibuprofen for symptom relief Delsym as needed for cough  Rash:  Looks like a contact dermatitis Hydrocortisone cream OTC  RTC as needed or if symptoms persist.

## 2015-06-13 ENCOUNTER — Other Ambulatory Visit: Payer: Self-pay | Admitting: Internal Medicine

## 2015-06-13 ENCOUNTER — Telehealth: Payer: Self-pay

## 2015-06-13 MED ORDER — AMOXICILLIN 500 MG PO CAPS: 500.0000 mg | ORAL_CAPSULE | Freq: Three times a day (TID) | ORAL | Status: DC

## 2015-06-13 MED ORDER — AZITHROMYCIN 250 MG PO TABS: ORAL_TABLET | ORAL | Status: DC

## 2015-06-13 NOTE — Telephone Encounter (Signed)
I sent her in a zpack to her pharmacy. I know she requested Amoxil but I think a zpack is more appropriate

## 2015-06-13 NOTE — Telephone Encounter (Signed)
Pt left v/m; pt seen 06/11/15 and pt continues with symptoms when seen 06/11/15; S/T seems worse and mucinex is not helping.pt request amoxicillin sent to [facility]. Pt request cb.

## 2015-06-13 NOTE — Telephone Encounter (Signed)
Pt notified as instructed.

## 2015-06-13 NOTE — Telephone Encounter (Signed)
Amoxil sent to pharmacy

## 2015-06-13 NOTE — Telephone Encounter (Signed)
Pt left v/m; pt got message from pharmacy that zpak was ready for pick up; pt said zpak hurts pt stomach and pt request amoxicillin. Pt request cb.

## 2015-06-25 DIAGNOSIS — R102 Pelvic and perineal pain: Secondary | ICD-10-CM | POA: Diagnosis not present

## 2015-06-25 DIAGNOSIS — N3010 Interstitial cystitis (chronic) without hematuria: Secondary | ICD-10-CM | POA: Diagnosis not present

## 2015-06-27 ENCOUNTER — Other Ambulatory Visit: Payer: Self-pay

## 2015-06-27 ENCOUNTER — Encounter: Payer: Self-pay | Admitting: Primary Care

## 2015-06-27 ENCOUNTER — Ambulatory Visit (INDEPENDENT_AMBULATORY_CARE_PROVIDER_SITE_OTHER): Payer: Medicare Other | Admitting: Primary Care

## 2015-06-27 VITALS — BP 118/64 | HR 53 | Temp 97.3°F | Wt 207.8 lb

## 2015-06-27 DIAGNOSIS — J069 Acute upper respiratory infection, unspecified: Secondary | ICD-10-CM | POA: Diagnosis not present

## 2015-06-27 MED ORDER — AMOXICILLIN-POT CLAVULANATE 875-125 MG PO TABS: 1.0000 | ORAL_TABLET | Freq: Two times a day (BID) | ORAL | Status: DC

## 2015-06-27 NOTE — Telephone Encounter (Signed)
Pt notified as instructed; there was opening at 1 pm today with Allie Bossier NP; pt scheduled appt.

## 2015-06-27 NOTE — Progress Notes (Signed)
Pre visit review using our clinic review tool, if applicable. No additional management support is needed unless otherwise documented below in the visit note. 

## 2015-06-27 NOTE — Telephone Encounter (Signed)
We do not refill antibiotics. If she is not better, she needs follow up appt

## 2015-06-27 NOTE — Telephone Encounter (Signed)
Pt left v/m; pt seen 06/11/15 and see 06/13/15 phone note. Pt is better but not well; pt has a lot of head congestion; difficult to sleep at night due to congestion in head. Slight prod cough with yellow phlegm; no wheezing,pt has no more SOB than usual; no fever. Still slight sorethroat; using mucinex and saline nose spray. Pt request refill of amoxicillin to [facility]. Pt request cb.

## 2015-06-27 NOTE — Progress Notes (Signed)
Subjective:    Patient ID: Heather Shaw, female    DOB: [birth year], 74 y.o.   MRN: [id number]  HPI  [name] is a 74 year old female who presents today with a chief complaint of sore throat. She also reports symptoms of nasal congestion, body aches, and cough. She was evaluated in the clinic on 06/11/15 with same complaints. She was treated for a viral URI with supportive measures. She then was sent a Zpak on 06/13/15 which she never took as it has historically caused GI upset. Amoxicillin was called in on 06/13/15 as she reports this always works. She finished her course of amoxicillin.  Since completion of antibiotic she's feeling slightly improved. She continues to experience nasal congestion and sore throat. She's also reporting fatigue, body aches, sinus pressure. She cannot take antihistamines. Denies fevers.     Review of Systems  Constitutional: Negative for fever and chills.  HENT: Positive for congestion, sinus pressure and sore throat.   Respiratory: Positive for cough. Negative for shortness of breath.   Cardiovascular: Negative for chest pain.       Past Medical History  Diagnosis Date  . Hyperlipidemia   . Hypertension   . Vertigo   . DDD (degenerative disc disease), lumbosacral   . Arthritis   . IC (interstitial cystitis)   . PONV (postoperative nausea and vomiting)   . Bladder pain   . History of colon polyps   . Uterine fibroid   . Wears glasses   . Wears partial dentures     Social History   Social History  . Marital Status: Married    Spouse Name: N/A  . Number of Children: N/A  . Years of Education: N/A   Occupational History  . Not on file.   Social History Main Topics  . Smoking status: Former Smoker    Types: Cigarettes    Quit date: 05/17/1971  . Smokeless tobacco: Never Used  . Alcohol Use: No  . Drug Use: No  . Sexual Activity: Not on file   Other Topics Concern  . Not on file   Social History Narrative    Past Surgical  History  Procedure Laterality Date  . Knee arthroscopy Right 11-18-2009  . Cysto/  hydrodistention/  instillation clorpactic  09-21-2010  . Dilation and curettage of uterus  1988  . Colonoscopy w/ polypectomy  2009  . Cysto with hydrodistension N/A 05/20/2014    Procedure: CYSTOSCOPY/HYDRODISTENSION, INSTILLATION OF CHLORPACTIN;  Surgeon: Bernestine Amass, MD;  Location: [facility];  Service: Urology;  Laterality: N/A;    Family History  Problem Relation Age of Onset  . COPD Father     Allergies  Allergen Reactions  . Allegra [Fexofenadine] Other (See Comments)    Makes pt nervous  . Codeine Nausea And Vomiting  . Prednisone     Causes elevation in blood pressure- ? All steroids cause same reaction  . Zithromax [Azithromycin] Other (See Comments)    Burns stomach  . Sulfa Antibiotics Rash    Current Outpatient Prescriptions on File Prior to Visit  Medication Sig Dispense Refill  . acetaminophen (TYLENOL) 500 MG tablet Take 1,000 mg by mouth as needed.     Marland Kitchen atenolol (TENORMIN) 25 MG tablet TAKE 1 TABLET BY MOUTH EVERY DAY 30 tablet 11  . fluocinonide-emollient (LIDEX-E) 0.05 % cream Apply 1 application topically 2 (two) times daily. 30 g 0  . hydrochlorothiazide (HYDRODIURIL) 25 MG tablet TAKE 1 TABLET BY MOUTH EVERY  DAY 30 tablet 11  . lisinopril (PRINIVIL,ZESTRIL) 40 MG tablet TAKE 1 TABLET BY MOUTH EVERY DAY 30 tablet 9  . meclizine (ANTIVERT) 12.5 MG tablet Take 1 tablet (12.5 mg total) by mouth 3 (three) times daily as needed for dizziness. 30 tablet 0  . Meth-Hyo-M Bl-Na Phos-Ph Sal (URIBEL) 118 MG CAPS Take by mouth as needed.    . traMADol (ULTRAM) 50 MG tablet TAKE 1 TABLET BY MOUTH EVERY 6 HOURS AS NEEDED FOR PAIN 60 tablet 0  . HYDROcodone-acetaminophen (NORCO) 7.5-325 MG tablet TAKE 1 TABLET BY MOUTH TWICE DAILY AS NEEDED FOR PAIN (Patient not taking: Reported on 06/27/2015) 60 tablet 0   No current facility-administered medications on file prior to  visit.    BP 118/64 mmHg  Pulse 53  Temp(Src) 97.3 F (36.3 C) (Oral)  Wt 207 lb 12.8 oz (94.257 kg)  SpO2 97%    Objective:   Physical Exam  Constitutional: She appears well-nourished.  HENT:  Right Ear: Tympanic membrane and ear canal normal.  Left Ear: Tympanic membrane and ear canal normal.  Nose: Right sinus exhibits no maxillary sinus tenderness and no frontal sinus tenderness. Left sinus exhibits no maxillary sinus tenderness and no frontal sinus tenderness.  Mouth/Throat: Oropharynx is clear and moist.  Eyes: Conjunctivae are normal. Pupils are equal, round, and reactive to light.  Neck: Neck supple.  Cardiovascular: Normal rate and regular rhythm.   Pulmonary/Chest: Effort normal and breath sounds normal. She has no rales.  Lymphadenopathy:    She has no cervical adenopathy.  Skin: Skin is warm and dry.          Assessment & Plan:  Nasal Congestion:  Present for 2.5 weeks with sore throat, body aches, cough. Exam unremarkable with clear lungs. Does not appear ill.  Cannot tolerate prednisone and antihistamines. Do not think she's acutely ill with bacterial process although she is convinced that another round of amoxicillin will help. Suspect allergies. Discussed that viral illnesses will dissipate gradually on their own and that she'd need an antihistamine and or nasal spray to help with allergies.  Slight improvement on amoxicillin, given age, will send in trial of Augmentin. Return precautions provided.

## 2015-06-27 NOTE — Patient Instructions (Signed)
Start Augmentin antibiotics. Take 1 tablet by mouth twice daily for 7 days.  Increase fluids and rest.   It was a pleasure meeting you!

## 2015-06-30 ENCOUNTER — Other Ambulatory Visit: Payer: Self-pay

## 2015-06-30 NOTE — Telephone Encounter (Signed)
Pt left v/m requesting rx hydrocodone apap. Call when ready for pick up. rx last printed # 60 on 06/02/15; last seen F/u appt on 02/12/15.

## 2015-07-01 MED ORDER — HYDROCODONE-ACETAMINOPHEN 7.5-325 MG PO TABS: ORAL_TABLET | ORAL | Status: DC

## 2015-07-01 NOTE — Telephone Encounter (Signed)
Lm on pts vm and informed her Rx is available for pickup from the front desk 

## 2015-07-18 ENCOUNTER — Other Ambulatory Visit: Payer: Self-pay | Admitting: Family Medicine

## 2015-07-18 MED ORDER — FLUOCINONIDE-E 0.05 % EX CREA: 1.0000 "application " | TOPICAL_CREAM | Freq: Two times a day (BID) | CUTANEOUS | Status: DC

## 2015-07-18 NOTE — Telephone Encounter (Signed)
rx called into pharmacy

## 2015-07-18 NOTE — Telephone Encounter (Signed)
Pt has new rash on lt wrist just like the rash pt had on lt ankle 09/19/14; rash on lt ankle went away after using lidex cream and pt wants to try on rash on wrist. Please advise.[facility].02/12/15 last f/u appt.

## 2015-07-24 ENCOUNTER — Encounter: Payer: Self-pay | Admitting: *Deleted

## 2015-07-30 ENCOUNTER — Other Ambulatory Visit: Payer: Self-pay

## 2015-07-30 DIAGNOSIS — M542 Cervicalgia: Secondary | ICD-10-CM | POA: Diagnosis not present

## 2015-07-30 NOTE — Telephone Encounter (Signed)
Pt left v/m requesting rx hydrocodone apap. Call when ready for pick up. rx last printed # 60 on 07/01/15;last seen 02/12/15 for f/u. Pt would like to pick up by 08/01/15.

## 2015-07-31 MED ORDER — HYDROCODONE-ACETAMINOPHEN 7.5-325 MG PO TABS: ORAL_TABLET | ORAL | Status: DC

## 2015-07-31 NOTE — Telephone Encounter (Signed)
Lm on pts vm informing her Rx is available for pickup from the front desk 

## 2015-07-31 NOTE — Telephone Encounter (Signed)
Dr. Deborra Medina note reviewed. UDS reviewed. RX printed and signed and given to [facility]

## 2015-08-29 ENCOUNTER — Telehealth: Payer: Self-pay | Admitting: *Deleted

## 2015-09-01 MED ORDER — HYDROCODONE-ACETAMINOPHEN 7.5-325 MG PO TABS: ORAL_TABLET | ORAL | Status: DC

## 2015-09-01 NOTE — Telephone Encounter (Signed)
Patient called to find out if her prescription is ready. She needs it today.  Please call patient back at [phone].

## 2015-09-01 NOTE — Telephone Encounter (Signed)
Spoke to pt and informed her Rx is available for pickup from the front desk 

## 2015-09-04 ENCOUNTER — Other Ambulatory Visit: Payer: Self-pay | Admitting: Family Medicine

## 2015-09-04 NOTE — Telephone Encounter (Signed)
Last f/u 01/2015 

## 2015-09-04 NOTE — Telephone Encounter (Signed)
rx called in to requested pharmacy 

## 2015-09-18 DIAGNOSIS — M65341 Trigger finger, right ring finger: Secondary | ICD-10-CM | POA: Diagnosis not present

## 2015-09-26 ENCOUNTER — Other Ambulatory Visit: Payer: Self-pay

## 2015-09-26 NOTE — Telephone Encounter (Signed)
Ok to print and put on my desk for signature. 

## 2015-09-26 NOTE — Telephone Encounter (Signed)
Pt left v/m requesting rx hydrocodone apap. Call when ready for pick up.last printed # 60 on 09/01/15; last seen 02/12/15.

## 2015-09-29 MED ORDER — HYDROCODONE-ACETAMINOPHEN 7.5-325 MG PO TABS: ORAL_TABLET | ORAL | Status: DC

## 2015-09-29 NOTE — Telephone Encounter (Signed)
Pt came into office and picked up Rx 

## 2015-10-02 ENCOUNTER — Emergency Department (HOSPITAL_COMMUNITY)
Admission: EM | Admit: 2015-10-02 | Discharge: 2015-10-03 | Disposition: A | Discharge status: Home / Self Care | Payer: Medicare Other | Attending: Emergency Medicine | Admitting: Emergency Medicine

## 2015-10-02 ENCOUNTER — Encounter (HOSPITAL_COMMUNITY): Payer: Self-pay | Admitting: Emergency Medicine

## 2015-10-02 DIAGNOSIS — Z87448 Personal history of other diseases of urinary system: Secondary | ICD-10-CM | POA: Diagnosis not present

## 2015-10-02 DIAGNOSIS — Z87891 Personal history of nicotine dependence: Secondary | ICD-10-CM | POA: Diagnosis not present

## 2015-10-02 DIAGNOSIS — R109 Unspecified abdominal pain: Secondary | ICD-10-CM | POA: Diagnosis present

## 2015-10-02 DIAGNOSIS — R1010 Upper abdominal pain, unspecified: Secondary | ICD-10-CM | POA: Diagnosis not present

## 2015-10-02 DIAGNOSIS — M1990 Unspecified osteoarthritis, unspecified site: Secondary | ICD-10-CM | POA: Diagnosis not present

## 2015-10-02 DIAGNOSIS — R110 Nausea: Secondary | ICD-10-CM | POA: Diagnosis not present

## 2015-10-02 DIAGNOSIS — R1011 Right upper quadrant pain: Secondary | ICD-10-CM | POA: Diagnosis not present

## 2015-10-02 DIAGNOSIS — I10 Essential (primary) hypertension: Secondary | ICD-10-CM | POA: Insufficient documentation

## 2015-10-02 DIAGNOSIS — Z86010 Personal history of colonic polyps: Secondary | ICD-10-CM | POA: Diagnosis not present

## 2015-10-02 DIAGNOSIS — Z79899 Other long term (current) drug therapy: Secondary | ICD-10-CM | POA: Diagnosis not present

## 2015-10-02 DIAGNOSIS — Z8639 Personal history of other endocrine, nutritional and metabolic disease: Secondary | ICD-10-CM | POA: Diagnosis not present

## 2015-10-02 LAB — COMPREHENSIVE METABOLIC PANEL
ALBUMIN: 4.2 g/dL (ref 3.5–5.0)
ALK PHOS: 43 U/L (ref 38–126)
ALT: 16 U/L (ref 14–54)
AST: 21 U/L (ref 15–41)
Anion gap: 13 (ref 5–15)
BUN: 10 mg/dL (ref 6–20)
CALCIUM: 9.4 mg/dL (ref 8.9–10.3)
CO2: 23 mmol/L (ref 22–32)
CREATININE: 0.68 mg/dL (ref 0.44–1.00)
Chloride: 102 mmol/L (ref 101–111)
GFR calc Af Amer: >60 mL/min (ref >60)
GFR calc non Af Amer: >60 mL/min (ref >60)
GLUCOSE: 124 mg/dL — AB (ref 65–99)
Potassium: 3.9 mmol/L (ref 3.5–5.1)
SODIUM: 138 mmol/L (ref 135–145)
Total Bilirubin: 0.4 mg/dL (ref 0.3–1.2)
Total Protein: 7.2 g/dL (ref 6.5–8.1)

## 2015-10-02 LAB — CBC
HCT: 39.6 % (ref 36.0–46.0)
HEMOGLOBIN: 13.8 g/dL (ref 12.0–15.0)
MCH: 28.6 pg (ref 26.0–34.0)
MCHC: 34.8 g/dL (ref 30.0–36.0)
MCV: 82.2 fL (ref 78.0–100.0)
PLATELETS: 205 10*3/uL (ref 150–400)
RBC: 4.82 MIL/uL (ref 3.87–5.11)
RDW: 12.7 % (ref 11.5–15.5)
WBC: 10.7 10*3/uL — ABNORMAL HIGH (ref 4.0–10.5)

## 2015-10-02 LAB — URINALYSIS, ROUTINE W REFLEX MICROSCOPIC
BILIRUBIN URINE: NEGATIVE
Glucose, UA: NEGATIVE mg/dL
HGB URINE DIPSTICK: NEGATIVE
Ketones, ur: NEGATIVE mg/dL
Nitrite: NEGATIVE
Protein, ur: NEGATIVE mg/dL
SPECIFIC GRAVITY, URINE: 1.022 (ref 1.005–1.030)
pH: 6.5 (ref 5.0–8.0)

## 2015-10-02 LAB — URINE MICROSCOPIC-ADD ON: RBC / HPF: NONE SEEN RBC/hpf (ref 0–5)

## 2015-10-02 LAB — LIPASE, BLOOD: Lipase: 27 U/L (ref 11–51)

## 2015-10-02 MED ORDER — FENTANYL CITRATE (PF) 100 MCG/2ML IJ SOLN
50.0000 ug | Freq: Once | INTRAMUSCULAR | Status: AC
  Administered 2015-10-02: 50 ug via NASAL
  Filled 2015-10-02: qty 2

## 2015-10-02 MED ORDER — ONDANSETRON HCL 4 MG/2ML IJ SOLN
4.0000 mg | Freq: Once | INTRAMUSCULAR | Status: AC
  Administered 2015-10-03: 4 mg via INTRAVENOUS
  Filled 2015-10-02: qty 2

## 2015-10-02 MED ORDER — MORPHINE SULFATE (PF) 4 MG/ML IV SOLN
4.0000 mg | Freq: Once | INTRAVENOUS | Status: AC
  Administered 2015-10-03: 4 mg via INTRAVENOUS
  Filled 2015-10-02: qty 1

## 2015-10-02 NOTE — ED Notes (Addendum)
Patient presents for RUQ abdominal pain radiating to right flank pain x2-3 weeks, nausea and urinary frequency. Denies V/D, dysuria or hematuria.   Patient reports taking Tramadol 50mg  approximately 1500 today and Norco 5/325 approximately 1700 today.

## 2015-10-02 NOTE — ED Provider Notes (Signed)
CSN: [id number]     Arrival date & time 10/02/15  2026 History  By signing my name below, I, Heather Shaw, attest that this documentation has been prepared under the direction and in the presence of non-physician practitioner, Antonietta Breach, PA-C. Electronically Signed: [name], Scribe. 10/02/2015. 11:55 PM.    Chief Complaint  Patient presents with  . Abdominal Pain    The history is provided by the patient. No language interpreter was used.   HPI Comments:  Heather Shaw is a 75 y.o. female who presents to the Emergency Department complaining of stabbing right sided abdominal pain that radiates around to right flank. She has been experiencing the pain  intermittently x ~2 months. Her pain today has been constant since ~ 1630 today.  Pt reports associated nausea. She has been taking hydrocodone with moderate temporary relief. She denies h/o abdominal surgeries, and exacerbation of pain after eating. She also denies fever, black/tarry stools,  blood in stool, dysuria, hematuria, and vomiting. Last normal BM was ~ 1500 today.   Past Medical History  Diagnosis Date  . Hyperlipidemia   . Hypertension   . Vertigo   . DDD (degenerative disc disease), lumbosacral   . Arthritis   . IC (interstitial cystitis)   . PONV (postoperative nausea and vomiting)   . Bladder pain   . History of colon polyps   . Uterine fibroid   . Wears glasses   . Wears partial dentures    Past Surgical History  Procedure Laterality Date  . Knee arthroscopy Right 11-18-2009  . Cysto/  hydrodistention/  instillation clorpactic  09-21-2010  . Dilation and curettage of uterus  1988  . Colonoscopy w/ polypectomy  2009  . Cysto with hydrodistension N/A 05/20/2014    Procedure: CYSTOSCOPY/HYDRODISTENSION, INSTILLATION OF CHLORPACTIN;  Surgeon: Bernestine Amass, MD;  Location: [facility];  Service: Urology;  Laterality: N/A;   Family History  Problem Relation Age of Onset  . COPD Father     Social History  Substance Use Topics  . Smoking status: Former Smoker    Types: Cigarettes    Quit date: 05/17/1971  . Smokeless tobacco: Never Used  . Alcohol Use: No   OB History    No data available     Review of Systems  Constitutional: Negative for fever and chills.  Gastrointestinal: Positive for nausea and abdominal pain. Negative for vomiting, diarrhea and blood in stool.  Genitourinary: Positive for flank pain. Negative for dysuria and hematuria.  All other systems reviewed and are negative.  Allergies  Allegra; Codeine; Prednisone; Zithromax; and Sulfa antibiotics  Home Medications   Prior to Admission medications   Medication Sig Start Date End Date Taking? Authorizing Provider  acetaminophen (TYLENOL) 500 MG tablet Take 1,000 mg by mouth as needed.    Yes Historical Provider, MD  atenolol (TENORMIN) 25 MG tablet TAKE 1 TABLET BY MOUTH EVERY DAY 12/05/14  Yes Josue Hector, MD  fluocinonide-emollient (LIDEX-E) 0.05 % cream Apply 1 application topically 2 (two) times daily. 07/18/15  Yes Lucille Passy, MD  hydrochlorothiazide (HYDRODIURIL) 25 MG tablet TAKE 1 TABLET BY MOUTH EVERY DAY 03/24/15  Yes Lucille Passy, MD  HYDROcodone-acetaminophen (NORCO) 7.5-325 MG tablet TAKE 1 TABLET BY MOUTH TWICE DAILY AS NEEDED FOR PAIN 09/29/15  Yes Lucille Passy, MD  lisinopril (PRINIVIL,ZESTRIL) 40 MG tablet TAKE 1 TABLET BY MOUTH EVERY DAY 04/28/15  Yes Lucille Passy, MD  meclizine (ANTIVERT) 12.5 MG tablet Take 1  tablet (12.5 mg total) by mouth 3 (three) times daily as needed for dizziness. 02/12/15  Yes Lucille Passy, MD  Meth-Hyo-M Bl-Na Phos-Ph Sal (URIBEL) 118 MG CAPS Take by mouth as needed.   Yes Historical Provider, MD  traMADol (ULTRAM) 50 MG tablet TAKE 1 TABLET BY MOUTH EVERY 6 HOURS AS NEEDED FOR PAIN 09/04/15  Yes Lucille Passy, MD  amoxicillin-clavulanate (AUGMENTIN) 875-125 MG tablet Take 1 tablet by mouth 2 (two) times daily. Patient not taking: Reported on 10/02/2015 06/27/15    Pleas Koch, NP   BP 114/59 mmHg  Pulse 75  Temp(Src) 98.3 F (36.8 C) (Oral)  Resp 18  SpO2 93%   Physical Exam  Constitutional: She is oriented to person, place, and time. She appears well-developed and well-nourished. No distress.  Patient appears uncomfortable.  HENT:  Head: Normocephalic and atraumatic.  Eyes: Conjunctivae and EOM are normal. No scleral icterus.  Neck: Normal range of motion.  Cardiovascular: Normal rate, regular rhythm and intact distal pulses.   Pulmonary/Chest: Effort normal and breath sounds normal. No respiratory distress. She has no wheezes. She has no rales.  Nontoxic/nonseptic appearing  Abdominal: Soft. She exhibits no distension. There is tenderness. There is no rebound and no guarding.  TTP in the RUQ and R mid abdomen. Abdomen soft. No masses or peritoneal signs. Negative Murphy's sign.  Musculoskeletal: Normal range of motion.  Neurological: She is alert and oriented to person, place, and time. She exhibits normal muscle tone. Coordination normal.  Skin: Skin is warm and dry. No rash noted. She is not diaphoretic. No erythema. No pallor.  Psychiatric: She has a normal mood and affect. Her behavior is normal.  Nursing note and vitals reviewed.   ED Course  Procedures   DIAGNOSTIC STUDIES:  Oxygen Saturation is 97% on RA, normal by my interpretation.    COORDINATION OF CARE:  11:52 PM Pt updated with partial lab results. Will order CT A/P.  Discussed treatment plan with pt at bedside and pt agreed to plan.  Labs Review Labs Reviewed  COMPREHENSIVE METABOLIC PANEL - Abnormal; Notable for the following:    Glucose, Bld 124 (*)    All other components within normal limits  CBC - Abnormal; Notable for the following:    WBC 10.7 (*)    All other components within normal limits  URINALYSIS, ROUTINE W REFLEX MICROSCOPIC (NOT AT [facility]) - Abnormal; Notable for the following:    Leukocytes, UA SMALL (*)    All other components within normal  limits  URINE MICROSCOPIC-ADD ON - Abnormal; Notable for the following:    Squamous Epithelial / LPF 0-5 (*)    Bacteria, UA FEW (*)    All other components within normal limits  LIPASE, BLOOD    Imaging Review Ct Abdomen Pelvis W Contrast  10/03/2015  CLINICAL DATA:  Right-sided abdominal pain. Intermittent for 2 months, constant for the past 8 hours. EXAM: CT ABDOMEN AND PELVIS WITH CONTRAST TECHNIQUE: Multidetector CT imaging of the abdomen and pelvis was performed using the standard protocol following bolus administration of intravenous contrast. CONTRAST:  113mL OMNIPAQUE IOHEXOL 300 MG/ML  SOLN COMPARISON:  Right upper quadrant ultrasound earlier this day. CT 04/15/2014 FINDINGS: Lower chest:  The included lung bases are clear. Heart size normal. Liver: Diffusely decreased hepatic density and prominent liver size. No focal lesion. Hepatobiliary: Gallbladder physiologically distended, no calcified stone. No biliary dilatation. Pancreas: No ductal dilatation or inflammation. Small 5 mm fat density lesion in the distal body/tail,  unchanged. Spleen: Normal. Adrenal glands: No nodule. Kidneys: Symmetric renal enhancement. No hydronephrosis. Tiny subcentimeter hypodensity posterior mid left kidney, too small to characterize. Stomach/Bowel: Stomach physiologically distended. There are no dilated or thickened small bowel loops. Small volume of stool throughout the colon without colonic wall thickening. The appendix is normal. Vascular/Lymphatic: No retroperitoneal adenopathy. Abdominal aorta is normal in caliber. Moderate atherosclerosis without aneurysm. Reproductive: Small fundal fibroid again seen. Progressive enlargement of the left ovary currently measuring 7.6 x 4.8 x 6.5 cm, previously 3.9 x 4.0 x 4.6 cm. Probable varix of the right ovarian vein. Bladder: Physiologically distended, no wall thickening. Other: No free air, free fluid, or intra-abdominal fluid collection. Musculoskeletal: There are no  acute or suspicious osseous abnormalities. Degenerative change in the spine. IMPRESSION: 1. No acute abnormality in the abdomen/pelvis. 2. Progressive left ovarian enlargement, currently 7.6 x 4.8 x 6.5 cm, abnormal for postmenopausal patient. As recommended previously, recommend pelvic ultrasound characterization. Unless there are left lower quadrant symptoms, this can be performed on a nonemergent basis. Electronically Signed   By: Jeb Levering M.D.   On: 10/03/2015 02:39   US Abdomen Limited Ruq  10/03/2015  CLINICAL DATA:  Right upper quadrant pain. EXAM: US ABDOMEN LIMITED - RIGHT UPPER QUADRANT COMPARISON:  CT abdomen and pelvis 04/15/2014 FINDINGS: Gallbladder: No gallstones or wall thickening visualized. No sonographic Murphy sign noted by sonographer. Common bile duct: Diameter: 4.2 mm, normal Liver: Increased parenchymal echotexture suggesting fatty infiltration. Limited examination due to patient body habitus and bowel gas. IMPRESSION: No findings suggesting cholecystitis or cholelithiasis. Probable diffuse fatty infiltration of the liver. Electronically Signed   By: Lucienne Capers M.D.   On: 10/03/2015 01:05   I have personally reviewed and evaluated these images and lab results as part of my medical decision-making.    MDM   Final diagnoses:  Pain of upper abdomen    75 year old female presents to the emergency department for evaluation of right upper quadrant abdominal pain. Symptoms are acute on chronic as patient reports similar symptoms as long as 2 months ago. Patient with a noncontributory laboratory workup as well as reassuring imaging, negative for emergent or surgical etiology. Patient has had stable vital signs. She is afebrile. Pain improved after 4 mg IV morphine as well as IM Bentyl and GI cocktail. Plan to refer to gastroenterology for follow-up. Return precautions discussed and provided. Patient discharged in satisfactory condition with no unaddressed concerns.  I  personally performed the services described in this documentation, which was scribed in my presence. The recorded information has been reviewed and is accurate.    Filed Vitals:   10/02/15 2119 10/03/15 0054 10/03/15 0308  BP: 179/98 140/66 114/59  Pulse: 98 74 75  Temp: 98.3 F (36.8 C)    TempSrc: Oral    Resp: 20 18 18   SpO2: 97% 98% 93%     Antonietta Breach, PA-C 10/03/15 0449  April Palumbo, MD 10/03/15 [phone]

## 2015-10-03 ENCOUNTER — Encounter (HOSPITAL_COMMUNITY): Payer: Self-pay

## 2015-10-03 ENCOUNTER — Emergency Department (HOSPITAL_COMMUNITY): Payer: Medicare Other

## 2015-10-03 DIAGNOSIS — R1011 Right upper quadrant pain: Secondary | ICD-10-CM | POA: Diagnosis not present

## 2015-10-03 DIAGNOSIS — R109 Unspecified abdominal pain: Secondary | ICD-10-CM | POA: Diagnosis not present

## 2015-10-03 MED ORDER — DICYCLOMINE HCL 20 MG PO TABS: 20.0000 mg | ORAL_TABLET | Freq: Two times a day (BID) | ORAL | Status: DC

## 2015-10-03 MED ORDER — KETOROLAC TROMETHAMINE 15 MG/ML IJ SOLN
15.0000 mg | Freq: Once | INTRAMUSCULAR | Status: AC
  Administered 2015-10-03: 15 mg via INTRAVENOUS
  Filled 2015-10-03: qty 1

## 2015-10-03 MED ORDER — IOHEXOL 300 MG/ML  SOLN
100.0000 mL | Freq: Once | INTRAMUSCULAR | Status: AC | PRN
  Administered 2015-10-03: 100 mL via INTRAVENOUS

## 2015-10-03 MED ORDER — IOHEXOL 300 MG/ML  SOLN
25.0000 mL | Freq: Once | INTRAMUSCULAR | Status: AC | PRN
  Administered 2015-10-03: 25 mL via ORAL

## 2015-10-03 MED ORDER — DICYCLOMINE HCL 10 MG/ML IM SOLN
20.0000 mg | Freq: Once | INTRAMUSCULAR | Status: AC
  Administered 2015-10-03: 20 mg via INTRAMUSCULAR
  Filled 2015-10-03: qty 2

## 2015-10-03 MED ORDER — GI COCKTAIL ~~LOC~~
30.0000 mL | Freq: Once | ORAL | Status: AC
  Administered 2015-10-03: 30 mL via ORAL
  Filled 2015-10-03: qty 30

## 2015-10-03 NOTE — ED Notes (Signed)
Patient d/c'd self care with family.  F/U and medications discussed.  Patient verbalized understanding.

## 2015-10-03 NOTE — Discharge Instructions (Signed)

## 2015-10-03 NOTE — ED Notes (Addendum)
Ultrasound at bedside

## 2015-10-07 ENCOUNTER — Ambulatory Visit
Admission: RE | Admit: 2015-10-07 | Discharge: 2015-10-07 | Disposition: A | Discharge status: Home / Self Care | Payer: Medicare Other | Source: Ambulatory Visit | Attending: Family Medicine | Admitting: Family Medicine

## 2015-10-07 ENCOUNTER — Encounter: Payer: Self-pay | Admitting: Family Medicine

## 2015-10-07 ENCOUNTER — Ambulatory Visit (INDEPENDENT_AMBULATORY_CARE_PROVIDER_SITE_OTHER): Payer: Medicare Other | Admitting: Family Medicine

## 2015-10-07 VITALS — BP 140/88 | HR 94 | Temp 97.7°F | Wt 203.5 lb

## 2015-10-07 DIAGNOSIS — R1010 Upper abdominal pain, unspecified: Secondary | ICD-10-CM | POA: Diagnosis not present

## 2015-10-07 DIAGNOSIS — M47814 Spondylosis without myelopathy or radiculopathy, thoracic region: Secondary | ICD-10-CM | POA: Diagnosis not present

## 2015-10-07 DIAGNOSIS — N838 Other noninflammatory disorders of ovary, fallopian tube and broad ligament: Secondary | ICD-10-CM | POA: Insufficient documentation

## 2015-10-07 DIAGNOSIS — R109 Unspecified abdominal pain: Secondary | ICD-10-CM | POA: Insufficient documentation

## 2015-10-07 DIAGNOSIS — M545 Low back pain, unspecified: Secondary | ICD-10-CM

## 2015-10-07 DIAGNOSIS — M546 Pain in thoracic spine: Secondary | ICD-10-CM | POA: Insufficient documentation

## 2015-10-07 DIAGNOSIS — S3992XA Unspecified injury of lower back, initial encounter: Secondary | ICD-10-CM | POA: Diagnosis not present

## 2015-10-07 NOTE — Progress Notes (Signed)
Pre visit review using our clinic review tool, if applicable. No additional management support is needed unless otherwise documented below in the visit note. 

## 2015-10-07 NOTE — Assessment & Plan Note (Signed)
More thoracic.  Not tender over spine.  ? MSK but very odd presentation as it radiates to groin. No blood in urine or findings on CT to suggest nephrolithiasis.  [place] of abdomen was negative. MRI of thoracic spine to look at nerve and musculature. She is quite tender over her abdomen on and right flight- no correlation in terms of acute findings on CT.

## 2015-10-07 NOTE — Assessment & Plan Note (Signed)
Dr. Nori Riis, her GYN, has been following this closely per pt.

## 2015-10-07 NOTE — Progress Notes (Signed)
Subjective:   Patient ID: Heather Shaw, female    DOB: 1941/04/20, 75 y.o.   MRN: [id number]  Heather Shaw is a pleasant 75 y.o. year old female who presents to clinic today with Hospitalization Follow-up  on 10/07/2015  HPI:  Presented to ER for right sided abdominal pain on 10/02/15.  Notes reviewed.  Pain has been occuring intermittently for 2 months.Sometimes associated with nausea, no vomiting.  Eating does not seem to make pain worse. She feels she is a little constipated.  Appetite decreased.  Now feels like pain is much worse- 8/10 and seems to originate from her mid upper right back and radiate to her abdomen.  Of note, she did fall through a panel in the floor a few days prior to these symptoms starting. Wt Readings from Last 3 Encounters:  10/07/15 203 lb 8 oz (92.307 kg)  06/27/15 207 lb 12.8 oz (94.257 kg)  06/11/15 206 lb (93.441 kg)     No dysuria.  WBC 10.7 UA for small LE only CMET and lipase unremarkable Abd [place] neg for cholecystitis or cholelithiasis  CT abd pelvis- no acute abnormality but her left ovary is more enlarged.  Per CT ordered by her GYN, Dr. Nori Riis, two years ago, ovary was enlarging at that time as well. Per pt, her GYN does an ultrasound of her ovary "every time I see him."  "he is aware of this." Ct Abdomen Pelvis W Contrast  10/03/2015  CLINICAL DATA:  Right-sided abdominal pain. Intermittent for 2 months, constant for the past 8 hours. EXAM: CT ABDOMEN AND PELVIS WITH CONTRAST TECHNIQUE: Multidetector CT imaging of the abdomen and pelvis was performed using the standard protocol following bolus administration of intravenous contrast. CONTRAST:  135mL OMNIPAQUE IOHEXOL 300 MG/ML  SOLN COMPARISON:  Right upper quadrant ultrasound earlier this day. CT 04/15/2014 FINDINGS: Lower chest:  The included lung bases are clear. Heart size normal. Liver: Diffusely decreased hepatic density and prominent liver size. No focal lesion. Hepatobiliary:  Gallbladder physiologically distended, no calcified stone. No biliary dilatation. Pancreas: No ductal dilatation or inflammation. Small 5 mm fat density lesion in the distal body/tail, unchanged. Spleen: Normal. Adrenal glands: No nodule. Kidneys: Symmetric renal enhancement. No hydronephrosis. Tiny subcentimeter hypodensity posterior mid left kidney, too small to characterize. Stomach/Bowel: Stomach physiologically distended. There are no dilated or thickened small bowel loops. Small volume of stool throughout the colon without colonic wall thickening. The appendix is normal. Vascular/Lymphatic: No retroperitoneal adenopathy. Abdominal aorta is normal in caliber. Moderate atherosclerosis without aneurysm. Reproductive: Small fundal fibroid again seen. Progressive enlargement of the left ovary currently measuring 7.6 x 4.8 x 6.5 cm, previously 3.9 x 4.0 x 4.6 cm. Probable varix of the right ovarian vein. Bladder: Physiologically distended, no wall thickening. Other: No free air, free fluid, or intra-abdominal fluid collection. Musculoskeletal: There are no acute or suspicious osseous abnormalities. Degenerative change in the spine. IMPRESSION: 1. No acute abnormality in the abdomen/pelvis. 2. Progressive left ovarian enlargement, currently 7.6 x 4.8 x 6.5 cm, abnormal for postmenopausal patient. As recommended previously, recommend pelvic ultrasound characterization. Unless there are left lower quadrant symptoms, this can be performed on a nonemergent basis. Electronically Signed   By: Jeb Levering M.D.   On: 10/03/2015 02:39   US Abdomen Limited Ruq  10/03/2015  CLINICAL DATA:  Right upper quadrant pain. EXAM: US ABDOMEN LIMITED - RIGHT UPPER QUADRANT COMPARISON:  CT abdomen and pelvis 04/15/2014 FINDINGS: Gallbladder: No gallstones or wall thickening visualized. No sonographic  Murphy sign noted by sonographer. Common bile duct: Diameter: 4.2 mm, normal Liver: Increased parenchymal echotexture suggesting  fatty infiltration. Limited examination due to patient body habitus and bowel gas. IMPRESSION: No findings suggesting cholecystitis or cholelithiasis. Probable diffuse fatty infiltration of the liver. Electronically Signed   By: Lucienne Capers M.D.   On: 10/03/2015 01:05       Review of Systems  Constitutional: Positive for appetite change. Negative for fever.  HENT: Negative.   Respiratory: Negative.   Cardiovascular: Negative.   Gastrointestinal: Positive for abdominal pain, constipation and abdominal distention. Negative for nausea, vomiting, diarrhea, blood in stool, anal bleeding and rectal pain.  Endocrine: Negative.   Genitourinary: Negative.   Musculoskeletal: Positive for back pain.  Skin: Negative.   Allergic/Immunologic: Negative.   Neurological: Negative.   Hematological: Negative.   Psychiatric/Behavioral: Negative.   All other systems reviewed and are negative.      Objective:    BP 140/88 mmHg  Pulse 94  Temp(Src) 97.7 F (36.5 C) (Oral)  Wt 203 lb 8 oz (92.307 kg)  SpO2 98%   Physical Exam  Constitutional: She is oriented to person, place, and time. She appears well-developed and well-nourished.  Appears as if she cannot get comfortable  HENT:  Head: Normocephalic and atraumatic.  Eyes: Conjunctivae are normal.  Cardiovascular: Normal rate.   Pulmonary/Chest: Effort normal.  Abdominal: Soft. She exhibits no distension and no mass. There is tenderness. There is guarding. There is no rebound.  Musculoskeletal: Normal range of motion.  Neurological: She is alert and oriented to person, place, and time. No cranial nerve deficit.  Skin: Skin is warm and dry. She is not diaphoretic.  Psychiatric: She has a normal mood and affect. Her behavior is normal. Judgment and thought content normal.  Nursing note and vitals reviewed.         Assessment & Plan:   Pain of upper abdomen  Left ovarian enlargement No Follow-up on file.

## 2015-10-07 NOTE — Patient Instructions (Signed)
Great to see you. Stop by [facility] on your way out.

## 2015-10-07 NOTE — Addendum Note (Signed)
Addended by: Lucille Passy on: 10/07/2015 08:29 AM   Modules accepted: Orders

## 2015-10-08 ENCOUNTER — Other Ambulatory Visit: Payer: Self-pay | Admitting: Family Medicine

## 2015-10-08 DIAGNOSIS — R1084 Generalized abdominal pain: Secondary | ICD-10-CM

## 2015-10-15 ENCOUNTER — Ambulatory Visit (INDEPENDENT_AMBULATORY_CARE_PROVIDER_SITE_OTHER): Payer: Medicare Other | Admitting: Gastroenterology

## 2015-10-15 ENCOUNTER — Other Ambulatory Visit (INDEPENDENT_AMBULATORY_CARE_PROVIDER_SITE_OTHER): Payer: Medicare Other

## 2015-10-15 ENCOUNTER — Encounter: Payer: Self-pay | Admitting: Gastroenterology

## 2015-10-15 ENCOUNTER — Other Ambulatory Visit: Payer: Self-pay

## 2015-10-15 VITALS — BP 140/74 | HR 64 | Ht 64.0 in | Wt 201.1 lb

## 2015-10-15 DIAGNOSIS — R140 Abdominal distension (gaseous): Secondary | ICD-10-CM

## 2015-10-15 DIAGNOSIS — R112 Nausea with vomiting, unspecified: Secondary | ICD-10-CM

## 2015-10-15 DIAGNOSIS — R109 Unspecified abdominal pain: Secondary | ICD-10-CM

## 2015-10-15 LAB — IGA: IGA: 206 mg/dL (ref 68–378)

## 2015-10-15 MED ORDER — OMEPRAZOLE 40 MG PO CPDR: 40.0000 mg | DELAYED_RELEASE_CAPSULE | Freq: Every day | ORAL | Status: DC

## 2015-10-15 MED ORDER — HYDROCODONE-ACETAMINOPHEN 7.5-325 MG PO TABS: ORAL_TABLET | ORAL | Status: DC

## 2015-10-15 NOTE — Patient Instructions (Signed)
Your physician has requested that you go to the basement for lab work before leaving today.   We have sent the following medications to your pharmacy for you to pick up at your convenience: Omeprazole   You have been scheduled for an endoscopy. Please follow written instructions given to you at your visit today. If you use inhalers (even only as needed), please bring them with you on the day of your procedure. Your physician has requested that you go to www.startemmi.com and enter the access code given to you at your visit today. This web site gives a general overview about your procedure. However, you should still follow specific instructions given to you by our office regarding your preparation for the procedure.    I appreciate the opportunity to care for you.

## 2015-10-15 NOTE — Telephone Encounter (Signed)
Pt left v/m requesting rx hydrocodone apap. Call when ready for pick up. rx last printed # 60 on 09/29/15 and seen 10/07/15. Pt having to take hydrocodone apap q 6 h due to pain. Pt to have endoscopy later this week.

## 2015-10-15 NOTE — Telephone Encounter (Signed)
Lm on pts vm and informed her Rx is available for pickup from the front desk. Per Dr Deborra Medina, pt will not be able to receive refills before they are due-pt has increasing pain for one time increase

## 2015-10-16 LAB — TISSUE TRANSGLUTAMINASE, IGA: TISSUE TRANSGLUTAMINASE AB, IGA: 1 U/mL (ref <4)

## 2015-10-17 ENCOUNTER — Ambulatory Visit (AMBULATORY_SURGERY_CENTER): Payer: Medicare Other | Admitting: Internal Medicine

## 2015-10-17 ENCOUNTER — Other Ambulatory Visit: Payer: Self-pay | Admitting: Family Medicine

## 2015-10-17 ENCOUNTER — Encounter: Payer: Self-pay | Admitting: Internal Medicine

## 2015-10-17 ENCOUNTER — Encounter: Payer: Self-pay | Admitting: Gastroenterology

## 2015-10-17 VITALS — BP 125/64 | HR 70 | Temp 97.3°F | Resp 12 | Ht 64.0 in | Wt 201.0 lb

## 2015-10-17 DIAGNOSIS — K2950 Unspecified chronic gastritis without bleeding: Secondary | ICD-10-CM | POA: Diagnosis not present

## 2015-10-17 DIAGNOSIS — R140 Abdominal distension (gaseous): Secondary | ICD-10-CM | POA: Diagnosis not present

## 2015-10-17 DIAGNOSIS — K219 Gastro-esophageal reflux disease without esophagitis: Secondary | ICD-10-CM | POA: Diagnosis not present

## 2015-10-17 DIAGNOSIS — I10 Essential (primary) hypertension: Secondary | ICD-10-CM | POA: Diagnosis not present

## 2015-10-17 DIAGNOSIS — R42 Dizziness and giddiness: Secondary | ICD-10-CM | POA: Diagnosis not present

## 2015-10-17 DIAGNOSIS — D130 Benign neoplasm of esophagus: Secondary | ICD-10-CM | POA: Diagnosis not present

## 2015-10-17 DIAGNOSIS — R110 Nausea: Secondary | ICD-10-CM

## 2015-10-17 DIAGNOSIS — R1013 Epigastric pain: Secondary | ICD-10-CM

## 2015-10-17 DIAGNOSIS — R109 Unspecified abdominal pain: Secondary | ICD-10-CM | POA: Diagnosis not present

## 2015-10-17 DIAGNOSIS — R6881 Early satiety: Secondary | ICD-10-CM

## 2015-10-17 DIAGNOSIS — R1110 Vomiting, unspecified: Secondary | ICD-10-CM | POA: Insufficient documentation

## 2015-10-17 HISTORY — PX: UPPER GASTROINTESTINAL ENDOSCOPY: SHX188

## 2015-10-17 MED ORDER — SODIUM CHLORIDE 0.9 % IV SOLN: 500.0000 mL | INTRAVENOUS | Status: DC

## 2015-10-17 NOTE — Telephone Encounter (Signed)
Rx called in to requested pharmacy 

## 2015-10-17 NOTE — Telephone Encounter (Signed)
[facility] wants to know if Dr Deborra Medina is going to change instructions for hydrocodone apap. Advised per note on 10/15/15 and 10/17/15.

## 2015-10-17 NOTE — Progress Notes (Signed)
Report to PACU, RN, vss, BBS= Clear.  

## 2015-10-17 NOTE — Progress Notes (Addendum)
10/17/2015 Heather Shaw [id number] 02/08/1941   HISTORY OF PRESENT ILLNESS:  This is a 75 year old female who is new to our practice and has been referred here by Dr. Deborra Medina for evaluation of bloating, abdominal pain, and nausea. She tells me that all the symptoms have been going on for a while, several months, but have been worsening. She complains of burning in her upper abdomen but denies any sensation of heartburn or reflux. She tells me that she gets very bloated every time she eats even with just a small amount of food such as a couple coffee and a couple of pieces of toast. She complains of nausea with occasional vomiting after eating. She had an abdominal ultrasound on March 10 that showed probable fatty infiltration of the liver. She also had a CT scan of abdomen pelvis with contrast that showed no acute abnormalities, but she had a progressive left ovarian enlargement. The patient states that her gynecologist has been "following this closely" and she's had multiple ultrasounds done. She tells me that she is going to follow-up with him again about it in the near future since he has recommended hysterectomy for her in the past.   She had a colonoscopy in April 2012 by Dr. Earlean Shawl at which time she is found have some rectal polyps and internal hemorrhoids. The polyps were hyperplastic, therefore she was placed in for a 10 year recall.   Past Medical History  Diagnosis Date  . Hyperlipidemia   . Hypertension   . Vertigo   . DDD (degenerative disc disease), lumbosacral   . Arthritis   . IC (interstitial cystitis)   . PONV (postoperative nausea and vomiting)   . Bladder pain   . History of colon polyps   . Uterine fibroid   . Wears glasses   . Wears partial dentures   . Polyp of rectum    Past Surgical History  Procedure Laterality Date  . Knee arthroscopy Right 11-18-2009  . Cysto/  hydrodistention/  instillation clorpactic  09-21-2010  . Dilation and curettage of uterus   1988  . Colonoscopy w/ polypectomy  2009  . Cysto with hydrodistension N/A 05/20/2014    Procedure: CYSTOSCOPY/HYDRODISTENSION, INSTILLATION OF CHLORPACTIN;  Surgeon: Bernestine Amass, MD;  Location: [facility];  Service: Urology;  Laterality: N/A;    reports that she quit smoking about 44 years ago. Her smoking use included Cigarettes. She has never used smokeless tobacco. She reports that she does not drink alcohol or use illicit drugs. family history includes COPD in her father; Clotting disorder in her mother; Emphysema in her father; Heart attack in her father; Stroke in her paternal aunt. Allergies  Allergen Reactions  . Allegra [Fexofenadine] Other (See Comments)    Makes pt nervous  . Codeine Nausea And Vomiting  . Prednisone     Causes elevation in blood pressure- ? All steroids cause same reaction  . Zithromax [Azithromycin] Other (See Comments)    Burns stomach  . Sulfa Antibiotics Rash      Outpatient Encounter Prescriptions as of 10/15/2015  Medication Sig  . acetaminophen (TYLENOL) 500 MG tablet Take 1,000 mg by mouth as needed.   Marland Kitchen atenolol (TENORMIN) 25 MG tablet TAKE 1 TABLET BY MOUTH EVERY DAY  . fluocinonide-emollient (LIDEX-E) 0.05 % cream Apply 1 application topically 2 (two) times daily.  . hydrochlorothiazide (HYDRODIURIL) 25 MG tablet TAKE 1 TABLET BY MOUTH EVERY DAY  . lisinopril (PRINIVIL,ZESTRIL) 40 MG tablet TAKE 1 TABLET  BY MOUTH EVERY DAY  . meclizine (ANTIVERT) 12.5 MG tablet Take 1 tablet (12.5 mg total) by mouth 3 (three) times daily as needed for dizziness.  . Meth-Hyo-M Bl-Na Phos-Ph Sal (URIBEL) 118 MG CAPS Take by mouth as needed.  . [DISCONTINUED] HYDROcodone-acetaminophen ([place]) 7.5-325 MG tablet TAKE 1 TABLET BY MOUTH TWICE DAILY AS NEEDED FOR PAIN  . [DISCONTINUED] traMADol (ULTRAM) 50 MG tablet TAKE 1 TABLET BY MOUTH EVERY 6 HOURS AS NEEDED FOR PAIN  . omeprazole (PRILOSEC) 40 MG capsule Take 1 capsule (40 mg total) by mouth  daily.  . [DISCONTINUED] dicyclomine (BENTYL) 20 MG tablet Take 1 tablet (20 mg total) by mouth 2 (two) times daily.   No facility-administered encounter medications on file as of 10/15/2015.     REVIEW OF SYSTEMS  : All other systems reviewed and negative except where noted in the History of Present Illness.   PHYSICAL EXAM: BP 140/74 mmHg  Pulse 64  Ht 5\' 4"  (1.626 m)  Wt 201 lb 2 oz (91.23 kg)  BMI 34.51 kg/m2 General: Well developed white female in no acute distress Head: Normocephalic and atraumatic Eyes:  Sclerae anicteric, conjunctiva pink. Ears: Normal auditory acuity Lungs: Clear throughout to auscultation Heart: Regular rate and rhythm Abdomen: Soft, non-distended.  Normal bowel sounds.  Mild epigastric and lower abdominal TTP. Musculoskeletal: Symmetrical with no gross deformities  Skin: No lesions on visible extremities Extremities: No edema  Neurological: Alert oriented x 4, grossly non-focal Psychological:  Alert and cooperative. Normal mood and affect  ASSESSMENT AND PLAN: -75 year old female with complaints of worsening upper abdominal pain with bloating and nausea along with occasional vomiting. We will schedule her for an EGD for further evaluation to rule out ulcer disease, etc. If this proves negative then may want to consider gastric emptying scan versus HIDA scan. We will also check her for celiac disease with serologies. I'm going to place her on omeprazole 40 mg daily in the interim as well.  ? If any of this is related to this ovarian enlargement seen on imaging. I explained to her that this also needs to be followed up as this is not normal and she agrees to do so.  **The risks, benefits, and alternatives to EGD were discussed with the patient and she consents to proceed.    CC:  Lucille Passy, MD  Addendum: Reviewed and agree with initial management. Jerene Bears, MD

## 2015-10-17 NOTE — Patient Instructions (Signed)
YOU HAD AN ENDOSCOPIC PROCEDURE TODAY AT [facility]:   Refer to the procedure report that was given to you for any specific questions about what was found during the examination.  If the procedure report does not answer your questions, please call your gastroenterologist to clarify.  If you requested that your care partner not be given the details of your procedure findings, then the procedure report has been included in a sealed envelope for you to review at your convenience later.  YOU SHOULD EXPECT: Some feelings of bloating in the abdomen. Passage of more gas than usual.  Walking can help get rid of the air that was put into your GI tract during the procedure and reduce the bloating. If you had a lower endoscopy (such as a colonoscopy or flexible sigmoidoscopy) you may notice spotting of blood in your stool or on the toilet paper. If you underwent a bowel prep for your procedure, you may not have a normal bowel movement for a few days.  Please Note:  You might notice some irritation and congestion in your nose or some drainage.  This is from the oxygen used during your procedure.  There is no need for concern and it should clear up in a day or so.  SYMPTOMS TO REPORT IMMEDIATELY:     Following upper endoscopy (EGD)  Vomiting of blood or coffee ground material  New chest pain or pain under the shoulder blades  Painful or persistently difficult swallowing  New shortness of breath  Fever of 100F or higher  Black, tarry-looking stools  For urgent or emergent issues, a gastroenterologist can be reached at any hour by calling [phone].   DIET: Your first meal following the procedure should be a small meal and then it is ok to progress to your normal diet. Heavy or fried foods are harder to digest and may make you feel nauseous or bloated.  Likewise, meals heavy in dairy and vegetables can increase bloating.  Drink plenty of fluids but you should avoid alcoholic beverages  for 24 hours.  ACTIVITY:  You should plan to take it easy for the rest of today and you should NOT DRIVE or use heavy machinery until tomorrow (because of the sedation medicines used during the test).    FOLLOW UP: Our staff will call the number listed on your records the next business day following your procedure to check on you and address any questions or concerns that you may have regarding the information given to you following your procedure. If we do not reach you, we will leave a message.  However, if you are feeling well and you are not experiencing any problems, there is no need to return our call.  We will assume that you have returned to your regular daily activities without incident.  If any biopsies were taken you will be contacted by phone or by letter within the next 1-3 weeks.  Please call us at [phone] if you have not heard about the biopsies in 3 weeks.    SIGNATURES/CONFIDENTIALITY: You and/or your care partner have signed paperwork which will be entered into your electronic medical record.  These signatures attest to the fact that that the information above on your After Visit Summary has been reviewed and is understood.  Full responsibility of the confidentiality of this discharge information lies with you and/or your care-partner.    Omeprazole as previously ordered by Dr Hilarie Fredrickson  Await biopsy results  Follow up with gynecologist

## 2015-10-17 NOTE — Progress Notes (Signed)
Called to room to assist during endoscopic procedure.  Patient ID and intended procedure confirmed with present staff. Received instructions for my participation in the procedure from the performing physician.  

## 2015-10-17 NOTE — Op Note (Signed)
[facility] Patient Name: Heather Shaw Procedure Date: 10/17/2015 9:53 AM MRN: [id number] Endoscopist: Jerene Bears , MD Age: 75 Referring MD:  Date of Birth: [birth year] Gender: Female Procedure:                Upper GI endoscopy Indications:              Epigastric abdominal pain, Abdominal bloating,                            Early satiety, Nausea Medicines:                Monitored Anesthesia Care Procedure:                Pre-Anesthesia Assessment:                           - Prior to the procedure, a History and Physical                            was performed, and patient medications and                            allergies were reviewed. The patient's tolerance of                            previous anesthesia was also reviewed. The risks                            and benefits of the procedure and the sedation                            options and risks were discussed with the patient.                            All questions were answered, and informed consent                            was obtained. Prior Anticoagulants: The patient has                            taken no previous anticoagulant or antiplatelet                            agents. ASA Grade Assessment: III - A patient with                            severe systemic disease. After reviewing the risks                            and benefits, the patient was deemed in                            satisfactory condition to undergo the procedure.  After obtaining informed consent, the endoscope was                            passed under direct vision. Throughout the                            procedure, the patient's blood pressure, pulse, and                            oxygen saturations were monitored continuously. The                            Model GIF-HQ190 [id number]) scope was introduced                            through the mouth, and advanced to the second part                     of duodenum. The upper GI endoscopy was                            accomplished without difficulty. The patient                            tolerated the procedure well. Scope In: Scope Out: Findings:      A single 4 mm polyp (mostly likely papilloma) was found 19 cm from the       incisors. The polyp was removed with a cold biopsy forceps. Resection       and retrieval were complete.      The exam of the esophagus was otherwise normal.      Patchy mild inflammation characterized by erythema was found in the       gastric antrum. Biopsies were taken with a cold forceps for histology.      Localized mild inflammation characterized by erythema was found in the       duodenal bulb.      The second portion of the duodenum was normal. Biopsies for histology       were taken with a cold forceps for evaluation of celiac disease.      The cardia and gastric fundus were normal on retroflexion. Complications:            No immediate complications. Estimated Blood Loss:     Estimated blood loss was minimal. Impression:               - Esophageal polyp was found. Resected and                            retrieved.                           - Gastritis. Biopsied.                           - Duodenitis.                           - Normal second portion of the duodenum. Biopsied.  Recommendation:           - Patient has a contact number available for                            emergencies. The signs and symptoms of potential                            delayed complications were discussed with the                            patient. Return to normal activities tomorrow.                            Written discharge instructions were provided to the                            patient.                           - Resume previous diet.                           - Continue present medications.                           - Await pathology results.                           - Await response  to omeprazole prescribed recently                            but not yet started                           - Make an appointment with your gynecologist to                            discuss the large ovarian cyst seen recently by CT                           - Return to GI clinic at the next available                            appointment. Procedure Code(s):        --- Professional ---                           [phone], Esophagogastroduodenoscopy, flexible,                            transoral; with biopsy, single or multiple CPT copyright 2016 [employer]. All rights reserved. Lajuan Lines. Hilarie Fredrickson, MD Jerene Bears, MD 10/17/2015 10:15:32 AM This report has been signed electronically. Number of Addenda: 0 Referring MD:      Marciano Sequin. Deborra Medina

## 2015-10-17 NOTE — Telephone Encounter (Signed)
Last f/u 01/2015. Pt also picked up additional Norco #60 this month, due to increased pain

## 2015-10-17 NOTE — Telephone Encounter (Signed)
Pt left v/m requesting changing the prescription for Hydrocodone apap so pt could pick up today. I spoke with [facility] CMA and she advised Dr Deborra Medina will not change quantity or instructions. Pt notified as instructed per v/m.

## 2015-10-20 ENCOUNTER — Telehealth: Payer: Self-pay | Admitting: *Deleted

## 2015-10-20 DIAGNOSIS — Z124 Encounter for screening for malignant neoplasm of cervix: Secondary | ICD-10-CM | POA: Diagnosis not present

## 2015-10-20 DIAGNOSIS — R102 Pelvic and perineal pain: Secondary | ICD-10-CM | POA: Diagnosis not present

## 2015-10-20 DIAGNOSIS — R1031 Right lower quadrant pain: Secondary | ICD-10-CM | POA: Diagnosis not present

## 2015-10-20 DIAGNOSIS — D251 Intramural leiomyoma of uterus: Secondary | ICD-10-CM | POA: Diagnosis not present

## 2015-10-20 DIAGNOSIS — Z6834 Body mass index (BMI) 34.0-34.9, adult: Secondary | ICD-10-CM | POA: Diagnosis not present

## 2015-10-20 NOTE — Telephone Encounter (Signed)
  Follow up Call-  Call back number 10/17/2015  Post procedure Call Back phone  # [phone]  Permission to leave phone message Yes     Patient questions:  Do you have a fever, pain , or abdominal swelling? No. Pain Score  0 *  Have you tolerated food without any problems? Yes.    Have you been able to return to your normal activities? Yes.    Do you have any questions about your discharge instructions: Diet   No. Medications  No. Follow up visit  No.  Do you have questions or concerns about your Care? No.  Actions: * If pain score is 4 or above: No action needed, pain <4.

## 2015-10-21 ENCOUNTER — Encounter: Payer: Self-pay | Admitting: Internal Medicine

## 2015-11-03 ENCOUNTER — Ambulatory Visit (INDEPENDENT_AMBULATORY_CARE_PROVIDER_SITE_OTHER): Payer: Medicare Other

## 2015-11-03 VITALS — BP 132/70 | HR 51 | Temp 98.1°F | Ht 64.0 in | Wt 206.0 lb

## 2015-11-03 DIAGNOSIS — Z23 Encounter for immunization: Secondary | ICD-10-CM | POA: Diagnosis not present

## 2015-11-03 DIAGNOSIS — Z0000 Encounter for general adult medical examination without abnormal findings: Secondary | ICD-10-CM | POA: Diagnosis not present

## 2015-11-03 NOTE — Progress Notes (Signed)
Pre visit review using our clinic review tool, if applicable. No additional management support is needed unless otherwise documented below in the visit note. 

## 2015-11-03 NOTE — Progress Notes (Signed)
I reviewed health advisor's note, was available for consultation, and agree with documentation and plan.  

## 2015-11-03 NOTE — Progress Notes (Signed)
Subjective:   Heather Shaw is a 75 y.o. female who presents for Medicare Annual (Subsequent) preventive examination.   Cardiac Risk Factors include: advanced age (>22men, >44 women);dyslipidemia;hypertension;obesity (BMI >30kg/m2)     Objective:     Vitals: BP 132/70 mmHg  Pulse 51  Temp(Src) 98.1 F (36.7 C) (Oral)  Ht 5\' 4"  (1.626 m)  Wt 206 lb (93.441 kg)  BMI 35.34 kg/m2  SpO2 97%  Body mass index is 35.34 kg/(m^2).   Tobacco History  Smoking status  . Former Smoker  . Types: Cigarettes  . Quit date: 05/17/1971  Smokeless tobacco  . Never Used     Counseling given: No   Past Medical History  Diagnosis Date  . Hyperlipidemia   . Hypertension   . Vertigo   . DDD (degenerative disc disease), lumbosacral   . Arthritis   . IC (interstitial cystitis)   . PONV (postoperative nausea and vomiting)   . Bladder pain   . History of colon polyps   . Uterine fibroid   . Wears glasses   . Wears partial dentures   . Polyp of rectum    Past Surgical History  Procedure Laterality Date  . Knee arthroscopy Right 11-18-2009  . Cysto/  hydrodistention/  instillation clorpactic  09-21-2010  . Dilation and curettage of uterus  1988  . Colonoscopy w/ polypectomy  2009  . Cysto with hydrodistension N/A 05/20/2014    Procedure: CYSTOSCOPY/HYDRODISTENSION, INSTILLATION OF CHLORPACTIN;  Surgeon: Bernestine Amass, MD;  Location: [facility];  Service: Urology;  Laterality: N/A;   Family History  Problem Relation Age of Onset  . COPD Father   . Heart attack Father   . Emphysema Father   . Stroke Paternal Aunt   . Clotting disorder Mother    History  Sexual Activity  . Sexual Activity: No    Outpatient Encounter Prescriptions as of 11/03/2015  Medication Sig  . acetaminophen (TYLENOL) 500 MG tablet Take 1,000 mg by mouth as needed.   Marland Kitchen atenolol (TENORMIN) 25 MG tablet TAKE 1 TABLET BY MOUTH EVERY DAY  . dicyclomine (BENTYL) 20 MG tablet Take 20 mg  by mouth 2 (two) times daily. Reported on 10/17/2015  . fluocinonide-emollient (LIDEX-E) 0.05 % cream Apply 1 application topically 2 (two) times daily.  . hydrochlorothiazide (HYDRODIURIL) 25 MG tablet TAKE 1 TABLET BY MOUTH EVERY DAY  . HYDROcodone-acetaminophen (NORCO) 7.5-325 MG tablet TAKE 1 TABLET BY MOUTH TWICE DAILY AS NEEDED FOR PAIN  . lisinopril (PRINIVIL,ZESTRIL) 40 MG tablet TAKE 1 TABLET BY MOUTH EVERY DAY  . magnesium hydroxide (MILK OF MAGNESIA) 400 MG/5ML suspension Take by mouth daily as needed for mild constipation.  . meclizine (ANTIVERT) 12.5 MG tablet Take 1 tablet (12.5 mg total) by mouth 3 (three) times daily as needed for dizziness.  . Meth-Hyo-M Bl-Na Phos-Ph Sal (URIBEL) 118 MG CAPS Take by mouth as needed.  . traMADol (ULTRAM) 50 MG tablet TAKE 1 TABLET BY MOUTH EVERY 6 HOURS AS NEEDED FOR PAIN  . [DISCONTINUED] omeprazole (PRILOSEC) 40 MG capsule Take 1 capsule (40 mg total) by mouth daily.   No facility-administered encounter medications on file as of 11/03/2015.    Activities of Daily Living In your present state of health, do you have any difficulty performing the following activities: 11/03/2015  Hearing? N  Vision? N  Difficulty concentrating or making decisions? N  Walking or climbing stairs? N  Dressing or bathing? N  Doing errands, shopping? N  Preparing Food  and eating ? N  Using the Toilet? N  In the past six months, have you accidently leaked urine? N  Do you have problems with loss of bowel control? N  Managing your Medications? N  Managing your Finances? N  Housekeeping or managing your Housekeeping? N    Patient Care Team: Lucille Passy, MD as PCP - General Rana Snare, MD as Consulting Physician (Urology) Maisie Fus, MD as Consulting Physician (Obstetrics and Gynecology) Juluis Rainier as Consulting Physician (Optometry)    Assessment:     Hearing Screening   125Hz  250Hz  500Hz  1000Hz  2000Hz  4000Hz  8000Hz   Right ear:   40 40 0 0     Left ear:   40 40 0 40   Vision Screening Comments: Last eye exam in 10/2014   Exercise Activities and Dietary recommendations Current Exercise Habits: The patient does not participate in regular exercise at present, Exercise limited by: None identified  Goals    . Increase physical activity     Starting 11/10/2015, I will begin walking for at least 2 miles 4 days a week.       Fall Risk Fall Risk  11/03/2015  Falls in the past year? No   Depression Screen PHQ 2/9 Scores 11/03/2015 05/29/2012  PHQ - 2 Score 0 0     Cognitive Testing MMSE - Mini Mental State Exam 11/03/2015  Orientation to time 5  Orientation to Place 5  Registration 3  Attention/ Calculation 0  Recall 3  Language- name 2 objects 0  Language- repeat 1  Language- follow 3 step command 3  Language- read & follow direction 0  Write a sentence 0  Copy design 0  Total score 20   PLEASE NOTE: A Mini-Cog screen was completed. Maximum score is 20. A value of 0 denotes this part of Folstein MMSE was not completed.  Orientation to Time - Max 5 Orientation to Place - Max 5 Registration - Max 3 Recall - Max 3 Language Repeat - Max 1 Language Follow 3 Step Command - Max 3  Immunization History  Administered Date(s) Administered  . Influenza Split 05/29/2012  . Pneumococcal Conjugate-13 11/03/2015  . Pneumococcal Polysaccharide-23 07/14/2006, 05/29/2012  . Td 07/11/2008  . Tdap 04/23/2014  . Zoster 10/27/2010   Screening Tests Health Maintenance  Topic Date Due  . MAMMOGRAM  10/24/2016 (Originally 09/22/2012)  . INFLUENZA VACCINE  02/24/2016  . COLONOSCOPY  07/29/2021  . TETANUS/TDAP  04/23/2024  . DEXA SCAN  Completed  . ZOSTAVAX  Completed  . PNA vac Low Risk Adult  Completed      Plan:     I have personally reviewed and addressed the Medicare Annual Wellness questionnaire and have noted the following in the patient's chart:  A. Medical and social history B. Use of alcohol, tobacco or illicit  drugs  C. Current medications and supplements D. Functional ability and status E.  Nutritional status F.  Physical activity G. Advance directives H. List of other physicians I.  Hospitalizations, surgeries, and ER visits in previous 12 months J.  Bryan to include hearing, vision, cognitive, depression L. Referrals and appointments - none  In addition, I have reviewed and discussed with patient certain preventive protocols, quality metrics, and best practice recommendations. A written personalized care plan for preventive services as well as general preventive health recommendations were provided to patient.  See attached scanned questionnaire for additional information.   Signed,   Lindell Noe, MHA, BS, LPN Health  Advisor 11/03/2015

## 2015-11-03 NOTE — Patient Instructions (Signed)
Heather Shaw , Thank you for taking time to come for your Medicare Wellness Visit. I appreciate your ongoing commitment to your health goals. Please review the following plan we discussed and let me know if I can assist you in the future.   These are the goals we discussed: Goals    . Increase physical activity     Starting 11/10/2015, I will begin walking for at least 2 miles 4 days a week.        This is a list of the screening recommended for you and due dates:  Health Maintenance  Topic Date Due  . Mammogram  10/24/2016*  . Flu Shot  02/24/2016  . Colon Cancer Screening  07/29/2021  . Tetanus Vaccine  04/23/2024  . DEXA scan (bone density measurement)  Completed  . Shingles Vaccine  Completed  . Pneumonia vaccines  Completed  *Topic was postponed. The date shown is not the original due date.   Preventive Care for Adults  A healthy lifestyle and preventive care can promote health and wellness. Preventive health guidelines for adults include the following key practices.  . A routine yearly physical is a good way to check with your health care provider about your health and preventive screening. It is a chance to share any concerns and updates on your health and to receive a thorough exam.  . Visit your dentist for a routine exam and preventive care every 6 months. Brush your teeth twice a day and floss once a day. Good oral hygiene prevents tooth decay and gum disease.  . The frequency of eye exams is based on your age, health, family medical history, use  of contact lenses, and other factors. Follow your health care provider's ecommendations for frequency of eye exams.  . Eat a healthy diet. Foods like vegetables, fruits, whole grains, low-fat dairy products, and lean protein foods contain the nutrients you need without too many calories. Decrease your intake of foods high in solid fats, added sugars, and salt. Eat the right amount of calories for you. Get information about a  proper diet from your health care provider, if necessary.  . Regular physical exercise is one of the most important things you can do for your health. Most adults should get at least 150 minutes of moderate-intensity exercise (any activity that increases your heart rate and causes you to sweat) each week. In addition, most adults need muscle-strengthening exercises on 2 or more days a week.  Silver Sneakers may be a benefit available to you. To determine eligibility, you may visit the website: www.silversneakers.com or contact program at 680-465-1235 Mon-Fri between 8AM-8PM.   . Maintain a healthy weight. The body mass index (BMI) is a screening tool to identify possible weight problems. It provides an estimate of body fat based on height and weight. Your health care provider can find your BMI and can help you achieve or maintain a healthy weight.   For adults 20 years and older: ? A BMI below 18.5 is considered underweight. ? A BMI of 18.5 to 24.9 is normal. ? A BMI of 25 to 29.9 is considered overweight. ? A BMI of 30 and above is considered obese.   . Maintain normal blood lipids and cholesterol levels by exercising and minimizing your intake of saturated fat. Eat a balanced diet with plenty of fruit and vegetables. Blood tests for lipids and cholesterol should begin at age 58 and be repeated every 5 years. If your lipid or cholesterol levels are high,  you are over 50, or you are at high risk for heart disease, you may need your cholesterol levels checked more frequently. Ongoing high lipid and cholesterol levels should be treated with medicines if diet and exercise are not working.  . If you smoke, find out from your health care provider how to quit. If you do not use tobacco, please do not start.  . If you choose to drink alcohol, please do not consume more than 2 drinks per day. One drink is considered to be 12 ounces (355 mL) of beer, 5 ounces (148 mL) of wine, or 1.5 ounces (44 mL) of  liquor.  . If you are 82-60 years old, ask your health care provider if you should take aspirin to prevent strokes.  . Use sunscreen. Apply sunscreen liberally and repeatedly throughout the day. You should seek shade when your shadow is shorter than you. Protect yourself by wearing long sleeves, pants, a wide-brimmed hat, and sunglasses year round, whenever you are outdoors.  . Once a month, do a whole body skin exam, using a mirror to look at the skin on your back. Tell your health care provider of new moles, moles that have irregular borders, moles that are larger than a pencil eraser, or moles that have changed in shape or color.

## 2015-11-14 ENCOUNTER — Other Ambulatory Visit: Payer: Self-pay | Admitting: *Deleted

## 2015-11-14 MED ORDER — OMEPRAZOLE 40 MG PO CPDR: 40.0000 mg | DELAYED_RELEASE_CAPSULE | Freq: Every day | ORAL | Status: DC

## 2015-11-16 ENCOUNTER — Other Ambulatory Visit: Payer: Self-pay | Admitting: Family Medicine

## 2015-11-17 NOTE — Telephone Encounter (Signed)
Rx called in to requested pharmacy 

## 2015-11-17 NOTE — Telephone Encounter (Signed)
Last f/u 10/2015-Wellness

## 2015-11-20 DIAGNOSIS — M65341 Trigger finger, right ring finger: Secondary | ICD-10-CM | POA: Diagnosis not present

## 2015-11-21 ENCOUNTER — Ambulatory Visit (INDEPENDENT_AMBULATORY_CARE_PROVIDER_SITE_OTHER): Payer: Medicare Other | Admitting: Internal Medicine

## 2015-11-21 ENCOUNTER — Other Ambulatory Visit: Payer: Self-pay

## 2015-11-21 ENCOUNTER — Encounter: Payer: Self-pay | Admitting: Internal Medicine

## 2015-11-21 VITALS — BP 116/80 | HR 53 | Temp 98.2°F | Resp 14 | Wt 205.0 lb

## 2015-11-21 DIAGNOSIS — J0140 Acute pansinusitis, unspecified: Secondary | ICD-10-CM

## 2015-11-21 MED ORDER — AMOXICILLIN 500 MG PO TABS: 1000.0000 mg | ORAL_TABLET | Freq: Two times a day (BID) | ORAL | Status: DC

## 2015-11-21 NOTE — Telephone Encounter (Signed)
Pt left v/m requesting rx hydrocodone apap. Call when ready for pick up. rx last printed # 60 on 10/15/15; last annual on 11/03/15.

## 2015-11-21 NOTE — Progress Notes (Signed)
Pre visit review using our clinic review tool, if applicable. No additional management support is needed unless otherwise documented below in the visit note. 

## 2015-11-21 NOTE — Assessment & Plan Note (Signed)
Time course suggests viral infection but she has generally progressed with these infections Discussed supportive care If worsens, start amoxil

## 2015-11-21 NOTE — Progress Notes (Signed)
Subjective:    Patient ID: Heather Shaw, female    DOB: [birth year], 75 y.o.   MRN: [id number]  HPI Here due to respiratory illness  Having head pressure Inflammation in bronchial tubes Started 3 days ago Throat is painful but feels mostly lower Has post nasal drip Yellow nasal drainage Husband also sick  Some cough--mostly sneezing Some frontal headache No fever No chills or sweats--but felt somewhat hot Some SOB from being "stopped up" Prone to sinus infection  Tried saline nasal spray only  Current Outpatient Prescriptions on File Prior to Visit  Medication Sig Dispense Refill  . acetaminophen (TYLENOL) 500 MG tablet Take 1,000 mg by mouth as needed.     Marland Kitchen atenolol (TENORMIN) 25 MG tablet TAKE 1 TABLET BY MOUTH EVERY DAY 30 tablet 11  . fluocinonide-emollient (LIDEX-E) 0.05 % cream Apply 1 application topically 2 (two) times daily. 30 g 0  . hydrochlorothiazide (HYDRODIURIL) 25 MG tablet TAKE 1 TABLET BY MOUTH EVERY DAY 30 tablet 11  . HYDROcodone-acetaminophen (NORCO) 7.5-325 MG tablet TAKE 1 TABLET BY MOUTH TWICE DAILY AS NEEDED FOR PAIN 60 tablet 0  . lisinopril (PRINIVIL,ZESTRIL) 40 MG tablet TAKE 1 TABLET BY MOUTH EVERY DAY 30 tablet 9  . magnesium hydroxide (MILK OF MAGNESIA) 400 MG/5ML suspension Take by mouth daily as needed for mild constipation.    . meclizine (ANTIVERT) 12.5 MG tablet Take 1 tablet (12.5 mg total) by mouth 3 (three) times daily as needed for dizziness. 30 tablet 0  . Meth-Hyo-M Bl-Na Phos-Ph Sal (URIBEL) 118 MG CAPS Take by mouth as needed.    . traMADol (ULTRAM) 50 MG tablet TAKE 1 TABLET BY MOUTH EVERY 6 HOURS AS NEEDED 60 tablet 0   No current facility-administered medications on file prior to visit.    Allergies  Allergen Reactions  . Allegra [Fexofenadine] Other (See Comments)    Makes pt nervous  . Codeine Nausea And Vomiting  . Prednisone     Causes elevation in blood pressure- ? All steroids cause same reaction  . Zithromax  [Azithromycin] Other (See Comments)    Burns stomach  . Sulfa Antibiotics Rash    Past Medical History  Diagnosis Date  . Hyperlipidemia   . Hypertension   . Vertigo   . DDD (degenerative disc disease), lumbosacral   . Arthritis   . IC (interstitial cystitis)   . PONV (postoperative nausea and vomiting)   . Bladder pain   . History of colon polyps   . Uterine fibroid   . Wears glasses   . Wears partial dentures   . Polyp of rectum     Past Surgical History  Procedure Laterality Date  . Knee arthroscopy Right 11-18-2009  . Cysto/  hydrodistention/  instillation clorpactic  09-21-2010  . Dilation and curettage of uterus  1988  . Colonoscopy w/ polypectomy  2009  . Cysto with hydrodistension N/A 05/20/2014    Procedure: CYSTOSCOPY/HYDRODISTENSION, INSTILLATION OF CHLORPACTIN;  Surgeon: Bernestine Amass, MD;  [facility];  Service: Urology;  Laterality: N/A;    Family History  Problem Relation Age of Onset  . COPD Father   . Heart attack Father   . Emphysema Father   . Stroke Paternal Aunt   . Clotting disorder Mother     Social History   Social History  . Marital Status: Married    Spouse Name: N/A  . Number of Children: 6  . Years of Education: N/A   Occupational History  .  housewife    Social History Main Topics  . Smoking status: Former Smoker    Types: Cigarettes    Quit date: 05/17/1971  . Smokeless tobacco: Never Used  . Alcohol Use: No  . Drug Use: No  . Sexual Activity: No   Other Topics Concern  . Not on file   Social History Narrative   Review of Systems No pollen allergies Has to sit up to sleep    Objective:   Physical Exam  Constitutional: She appears well-developed and well-nourished. No distress.  HENT:  Mouth/Throat: Oropharynx is clear and moist. No oropharyngeal exudate.  Mild frontal and maxillary sinusitis Moderate nasal inflammation TMs normal   Neck: Normal range of motion. Neck supple. No  thyromegaly present.  Pulmonary/Chest: Effort normal and breath sounds normal. No respiratory distress. She has no wheezes. She has no rales.  Lymphadenopathy:    She has no cervical adenopathy.          Assessment & Plan:

## 2015-11-21 NOTE — Patient Instructions (Signed)
Please start the antibiotic if you are worsening---or if you are not any better by the middle of next week.

## 2015-11-23 NOTE — Telephone Encounter (Signed)
Ok to print out and put on my desk for signature. 

## 2015-11-24 MED ORDER — HYDROCODONE-ACETAMINOPHEN 7.5-325 MG PO TABS: ORAL_TABLET | ORAL | Status: DC

## 2015-11-24 NOTE — Telephone Encounter (Signed)
rx placed at the front desk for pick up, Keri notified pt.

## 2015-11-24 NOTE — Telephone Encounter (Signed)
Pt left v/m requesting status of hydrocodone apap.

## 2015-12-04 ENCOUNTER — Encounter: Payer: Self-pay | Admitting: *Deleted

## 2015-12-08 ENCOUNTER — Other Ambulatory Visit: Payer: Self-pay | Admitting: Cardiovascular Disease

## 2015-12-16 ENCOUNTER — Other Ambulatory Visit: Payer: Self-pay | Admitting: Family Medicine

## 2015-12-17 NOTE — Telephone Encounter (Signed)
Last f/u 10/2015-CPE 

## 2015-12-17 NOTE — Telephone Encounter (Signed)
Rx called in to requested pharmacy 

## 2015-12-18 DIAGNOSIS — M65341 Trigger finger, right ring finger: Secondary | ICD-10-CM | POA: Diagnosis not present

## 2015-12-23 ENCOUNTER — Other Ambulatory Visit: Payer: Self-pay

## 2015-12-23 NOTE — Telephone Encounter (Signed)
Pt left v/m requesting rx hydrocodone apap. Call when ready for pick up. rx last printed # 61 on 11/24/15; last f/u appt on 10/07/15.Please advise.

## 2015-12-24 ENCOUNTER — Ambulatory Visit (INDEPENDENT_AMBULATORY_CARE_PROVIDER_SITE_OTHER): Payer: Medicare Other | Admitting: Internal Medicine

## 2015-12-24 ENCOUNTER — Encounter: Payer: Self-pay | Admitting: Internal Medicine

## 2015-12-24 VITALS — BP 130/64 | HR 52 | Ht 64.0 in | Wt 209.5 lb

## 2015-12-24 DIAGNOSIS — K649 Unspecified hemorrhoids: Secondary | ICD-10-CM | POA: Diagnosis not present

## 2015-12-24 DIAGNOSIS — IMO0001 Reserved for inherently not codable concepts without codable children: Secondary | ICD-10-CM

## 2015-12-24 DIAGNOSIS — R140 Abdominal distension (gaseous): Secondary | ICD-10-CM | POA: Diagnosis not present

## 2015-12-24 DIAGNOSIS — K5900 Constipation, unspecified: Secondary | ICD-10-CM

## 2015-12-24 DIAGNOSIS — R143 Flatulence: Secondary | ICD-10-CM

## 2015-12-24 DIAGNOSIS — K589 Irritable bowel syndrome without diarrhea: Secondary | ICD-10-CM

## 2015-12-24 MED ORDER — POLYETHYLENE GLYCOL 3350 17 GM/SCOOP PO POWD: ORAL | Status: DC

## 2015-12-24 MED ORDER — HYDROCODONE-ACETAMINOPHEN 7.5-325 MG PO TABS: ORAL_TABLET | ORAL | Status: DC

## 2015-12-24 NOTE — Patient Instructions (Addendum)
Please follow a low gas diet. Brochure has been given to you.  We have sent the following medications to your pharmacy for you to pick up at your convenience: Miralax   You have been scheduled for your hemorrhoidal banding with Dr Hilarie Fredrickson on Monday, 02/23/16 @ 3:30 pm.  You will be due for a recall colonoscopy in 10/2020. We will send you a reminder in the mail when it gets closer to that time.  If you are age 73 or older, your body mass index should be between 23-30. Your Body mass index is 35.94 kg/(m^2). If this is out of the aforementioned range listed, please consider follow up with your Primary Care Provider.  If you are age 44 or younger, your body mass index should be between 19-25. Your Body mass index is 35.94 kg/(m^2). If this is out of the aformentioned range listed, please consider follow up with your Primary Care Provider.

## 2015-12-25 ENCOUNTER — Encounter: Payer: Self-pay | Admitting: Family Medicine

## 2015-12-25 DIAGNOSIS — Z79891 Long term (current) use of opiate analgesic: Secondary | ICD-10-CM | POA: Diagnosis not present

## 2015-12-25 MED ORDER — HYDROCODONE-ACETAMINOPHEN 7.5-325 MG PO TABS: ORAL_TABLET | ORAL | Status: DC

## 2015-12-25 NOTE — Addendum Note (Signed)
Addended by: Modena Nunnery on: 12/25/2015 11:04 AM   Modules accepted: Orders

## 2015-12-25 NOTE — Telephone Encounter (Signed)
Unable to locate Rx. Reprinted to be signed by [facility] in Dr Hulen Shouts absence

## 2015-12-25 NOTE — Telephone Encounter (Signed)
Lm on pts vm and informed her Rx is available for pickup at the front desk. Pt advised third party unable to pickup

## 2015-12-26 NOTE — Progress Notes (Signed)
Subjective:    Patient ID: Heather Shaw, female    DOB: 13-Mar-1941, 75 y.o.   MRN: [id number]  HPI Heather Shaw is a 75 yo female with PMH of GERD, gastritis, IBS with abd pain and bloating who is seen for follow-up.  She was initially seen by Alonza Bogus, PA-C and then come for EGD.  EGD was performed on 10/17/2015. 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 Patchy inflammation in the gastric antrum which was biopsied. Mild localized bulbar duodenitis. Biopsies revealed a 9 papilloma. Benign gastric mucosa with minimal chronic inflammation. Negative for H. pylori. In benign small bowel mucosa. After her procedure she was started on PPI which had been previously prescribed, but not yet initiated. She took the omeprazole 40 mg daily. This seemed to help but caused constipation. For this reason she stopped. She reports that her abdominal pain and nausea has improved tremendously. She is still having some abdominal bloating but she notices this has improved with smaller more frequent meals. For her constipation she's been using intermittent milk of magnesia which she feels works fairly well. She is having intermittent issues with prolapsing internal hemorrhoids. She denies rectal bleeding.   Review of Systems As per HPI, otherwise normal  Current Medications, Allergies, Past Medical History, Past Surgical History, Family History and Social History were reviewed in [employer] record.     Objective:   Physical Exam BP 130/64 mmHg  Pulse 52  Ht 5\' 4"  (1.626 m)  Wt 209 lb 8 oz (95.029 kg)  BMI 35.94 kg/m2 Constitutional: Well-developed and well-nourished. No distress. HEENT: Normocephalic and atraumatic. Oropharynx is clear and moist. No oropharyngeal exudate. Conjunctivae are normal.  No scleral icterus. Cardiovascular: Normal rate, regular rhythm and intact distal pulses. No M/R/G Pulmonary/chest: Effort normal and breath sounds  normal. No wheezing, rales or rhonchi. Abdominal: Soft, nontender, nondistended. Bowel sounds active throughout.  Extremities: no clubbing, cyanosis, or edema Neurological: Alert and oriented to person place and time. Skin: Skin is warm and dry.  Psychiatric: Normal mood and affect. Behavior is normal.  CBC    Component Value Date/Time   WBC 10.7* 10/02/2015 2254   WBC 10.9 10/10/2012 2107   RBC 4.82 10/02/2015 2254   RBC 4.91 10/10/2012 2107   HGB 13.8 10/02/2015 2254   HGB 13.9 10/10/2012 2107   HCT 39.6 10/02/2015 2254   HCT 41.5 10/10/2012 2107   PLT 205 10/02/2015 2254   PLT 246 10/10/2012 2107   MCV 82.2 10/02/2015 2254   MCV 85 10/10/2012 2107   MCH 28.6 10/02/2015 2254   MCH 28.2 10/10/2012 2107   MCHC 34.8 10/02/2015 2254   MCHC 33.4 10/10/2012 2107   RDW 12.7 10/02/2015 2254   RDW 13.3 10/10/2012 2107   LYMPHSABS 3.0 02/12/2015 1329   MONOABS 0.4 02/12/2015 1329   EOSABS 0.5 02/12/2015 1329   BASOSABS 0.0 02/12/2015 1329    CMP     Component Value Date/Time   NA 138 10/02/2015 2254   NA 133* 10/10/2012 2107   K 3.9 10/02/2015 2254   K 4.5 10/10/2012 2107   CL 102 10/02/2015 2254   CL 97* 10/10/2012 2107   CO2 23 10/02/2015 2254   CO2 29 10/10/2012 2107   GLUCOSE 124* 10/02/2015 2254   GLUCOSE 109* 10/10/2012 2107   BUN 10 10/02/2015 2254   BUN 13 10/10/2012 2107   CREATININE 0.68 10/02/2015 2254   CREATININE 0.74 10/10/2012 2107  CALCIUM 9.4 10/02/2015 2254   CALCIUM 8.8 10/10/2012 2107   PROT 7.2 10/02/2015 2254   PROT 7.6 10/10/2012 2107   ALBUMIN 4.2 10/02/2015 2254   ALBUMIN 3.7 10/10/2012 2107   AST 21 10/02/2015 2254   AST 18 10/10/2012 2107   ALT 16 10/02/2015 2254   ALT 23 10/10/2012 2107   ALKPHOS 43 10/02/2015 2254   ALKPHOS 52 10/10/2012 2107   BILITOT 0.4 10/02/2015 2254   BILITOT 0.3 10/10/2012 2107   GFRNONAA >60 10/02/2015 2254   GFRNONAA >60 10/10/2012 2107   GFRAA >60 10/02/2015 2254   GFRAA >60 10/10/2012 2107   CT  abd/pelvis performed March 2017  IMPRESSION: 1. No acute abnormality in the abdomen/pelvis. 2. Progressive left ovarian enlargement, currently 7.6 x 4.8 x 6.5 cm, abnormal for postmenopausal patient. As recommended previously, recommend pelvic ultrasound characterization. Unless there are left lower quadrant symptoms, this can be performed on a nonemergent basis.       Assessment & Plan:  75 yo female with PMH of GERD, gastritis, IBS with abd pain and bloating who is seen for follow-up.  1. Abd bloating/mild gastritis -- Nausea and abdominal pain have improved. She became constipated with PPI discontinued. He benefited from smaller more frequent meals. I recommended a low bloating diet as well as gave her a copy of the FODMAP.  We discussed the possibility of SIBO and empiric treatment for the same.  After this discussion, she prefers to try the dietary modifications 1st, which is very reasonable.  She is asked to notify me if symptoms fail to improve with these measures.  2. Constipation -- using milk of magnesia intermittently. I recommended a more regular use of MiraLAX 17 g daily. Milk of magnesia can still be used if absolute necessary. Her constipation likely issues as discussed in #1 relate somewhat to the Vicodin that she is using at night for knee pain, back pain and her interstitial cystitis.  3. Prolapsed internal hemorrhoids -- we discussed the risk benefits and alternatives to hemorrhoidal banding. She agrees to proceed and this office visit will be scheduled  4. Ovarian cyst seen by CT -- patient has been strongly encouraged to see gynecology.  I reiterated this point today.  25 minutes spent with the patient today. Greater than 50% was spent in counseling and coordination of care with the patient

## 2015-12-27 ENCOUNTER — Observation Stay (HOSPITAL_COMMUNITY): Payer: Medicare Other

## 2015-12-27 ENCOUNTER — Observation Stay (HOSPITAL_COMMUNITY)
Admission: EM | Admit: 2015-12-27 | Discharge: 2015-12-30 | Disposition: A | Discharge status: Home / Self Care | Payer: Medicare Other | Attending: Internal Medicine | Admitting: Internal Medicine

## 2015-12-27 ENCOUNTER — Emergency Department (HOSPITAL_COMMUNITY): Payer: Medicare Other

## 2015-12-27 ENCOUNTER — Encounter (HOSPITAL_COMMUNITY): Payer: Self-pay

## 2015-12-27 DIAGNOSIS — R001 Bradycardia, unspecified: Secondary | ICD-10-CM | POA: Diagnosis not present

## 2015-12-27 DIAGNOSIS — R0789 Other chest pain: Secondary | ICD-10-CM | POA: Diagnosis not present

## 2015-12-27 DIAGNOSIS — Z973 Presence of spectacles and contact lenses: Secondary | ICD-10-CM | POA: Insufficient documentation

## 2015-12-27 DIAGNOSIS — Z79899 Other long term (current) drug therapy: Secondary | ICD-10-CM | POA: Insufficient documentation

## 2015-12-27 DIAGNOSIS — M1990 Unspecified osteoarthritis, unspecified site: Secondary | ICD-10-CM | POA: Diagnosis not present

## 2015-12-27 DIAGNOSIS — R079 Chest pain, unspecified: Principal | ICD-10-CM | POA: Insufficient documentation

## 2015-12-27 DIAGNOSIS — I10 Essential (primary) hypertension: Secondary | ICD-10-CM | POA: Diagnosis not present

## 2015-12-27 DIAGNOSIS — E785 Hyperlipidemia, unspecified: Secondary | ICD-10-CM | POA: Diagnosis present

## 2015-12-27 DIAGNOSIS — I471 Supraventricular tachycardia, unspecified: Secondary | ICD-10-CM | POA: Insufficient documentation

## 2015-12-27 DIAGNOSIS — Z98811 Dental restoration status: Secondary | ICD-10-CM | POA: Insufficient documentation

## 2015-12-27 DIAGNOSIS — Z86018 Personal history of other benign neoplasm: Secondary | ICD-10-CM | POA: Insufficient documentation

## 2015-12-27 DIAGNOSIS — M5137 Other intervertebral disc degeneration, lumbosacral region: Secondary | ICD-10-CM | POA: Insufficient documentation

## 2015-12-27 DIAGNOSIS — K648 Other hemorrhoids: Secondary | ICD-10-CM | POA: Diagnosis not present

## 2015-12-27 DIAGNOSIS — T783XXA Angioneurotic edema, initial encounter: Secondary | ICD-10-CM | POA: Insufficient documentation

## 2015-12-27 DIAGNOSIS — N3010 Interstitial cystitis (chronic) without hematuria: Secondary | ICD-10-CM | POA: Insufficient documentation

## 2015-12-27 DIAGNOSIS — Z87891 Personal history of nicotine dependence: Secondary | ICD-10-CM | POA: Insufficient documentation

## 2015-12-27 DIAGNOSIS — R0602 Shortness of breath: Secondary | ICD-10-CM | POA: Diagnosis not present

## 2015-12-27 HISTORY — DX: Bradycardia, unspecified: R00.1

## 2015-12-27 LAB — BASIC METABOLIC PANEL WITH GFR
Anion gap: 8 (ref 5–15)
BUN: 12 mg/dL (ref 6–20)
CO2: 25 mmol/L (ref 22–32)
Calcium: 10.1 mg/dL (ref 8.9–10.3)
Chloride: 104 mmol/L (ref 101–111)
Creatinine, Ser: 0.65 mg/dL (ref 0.44–1.00)
GFR calc Af Amer: >60 mL/min
GFR calc non Af Amer: >60 mL/min
Glucose, Bld: 118 mg/dL — ABNORMAL HIGH (ref 65–99)
Potassium: 3.5 mmol/L (ref 3.5–5.1)
Sodium: 137 mmol/L (ref 135–145)

## 2015-12-27 LAB — I-STAT TROPONIN, ED: Troponin i, poc: 0.02 ng/mL (ref 0.00–0.08)

## 2015-12-27 LAB — CBC
HCT: 38.1 % (ref 36.0–46.0)
Hemoglobin: 12.9 g/dL (ref 12.0–15.0)
MCH: 28.4 pg (ref 26.0–34.0)
MCHC: 33.9 g/dL (ref 30.0–36.0)
MCV: 83.9 fL (ref 78.0–100.0)
Platelets: 182 K/uL (ref 150–400)
RBC: 4.54 MIL/uL (ref 3.87–5.11)
RDW: 12.6 % (ref 11.5–15.5)
WBC: 6.3 K/uL (ref 4.0–10.5)

## 2015-12-27 LAB — D-DIMER, QUANTITATIVE (NOT AT ARMC): D-Dimer, Quant: 2.36 ug/mL-FEU — ABNORMAL HIGH (ref 0.00–0.50)

## 2015-12-27 MED ORDER — HYDROCHLOROTHIAZIDE 25 MG PO TABS
25.0000 mg | ORAL_TABLET | Freq: Every day | ORAL | Status: DC
  Administered 2015-12-27 – 2015-12-30 (×4): 25 mg via ORAL
  Filled 2015-12-27 (×5): qty 1

## 2015-12-27 MED ORDER — IOPAMIDOL (ISOVUE-370) INJECTION 76%
INTRAVENOUS | Status: AC
  Administered 2015-12-27: 80 mL
  Filled 2015-12-27: qty 100

## 2015-12-27 MED ORDER — LISINOPRIL 40 MG PO TABS
40.0000 mg | ORAL_TABLET | Freq: Every day | ORAL | Status: DC
  Administered 2015-12-27: 40 mg via ORAL
  Filled 2015-12-27 (×2): qty 1

## 2015-12-27 MED ORDER — ATENOLOL 25 MG PO TABS
25.0000 mg | ORAL_TABLET | Freq: Every day | ORAL | Status: DC
  Administered 2015-12-28 – 2015-12-30 (×3): 25 mg via ORAL
  Filled 2015-12-27 (×4): qty 1

## 2015-12-27 MED ORDER — ONDANSETRON HCL 4 MG/2ML IJ SOLN
4.0000 mg | Freq: Four times a day (QID) | INTRAMUSCULAR | Status: DC | PRN
  Administered 2015-12-27: 4 mg via INTRAVENOUS
  Filled 2015-12-27: qty 2

## 2015-12-27 MED ORDER — SODIUM CHLORIDE 0.9 % IV SOLN
INTRAVENOUS | Status: DC
  Administered 2015-12-27 – 2015-12-28 (×2): via INTRAVENOUS

## 2015-12-27 MED ORDER — MECLIZINE HCL 25 MG PO TABS: 12.5000 mg | ORAL_TABLET | Freq: Three times a day (TID) | ORAL | Status: DC | PRN

## 2015-12-27 MED ORDER — MORPHINE SULFATE (PF) 2 MG/ML IV SOLN
2.0000 mg | INTRAVENOUS | Status: DC | PRN
  Administered 2015-12-27 (×2): 2 mg via INTRAVENOUS
  Filled 2015-12-27 (×2): qty 1

## 2015-12-27 MED ORDER — ENOXAPARIN SODIUM 40 MG/0.4ML ~~LOC~~ SOLN
40.0000 mg | SUBCUTANEOUS | Status: DC
  Administered 2015-12-27: 40 mg via SUBCUTANEOUS
  Filled 2015-12-27: qty 0.4

## 2015-12-27 MED ORDER — ACETAMINOPHEN 325 MG PO TABS
650.0000 mg | ORAL_TABLET | ORAL | Status: DC | PRN
  Filled 2015-12-27: qty 2

## 2015-12-27 MED ORDER — GI COCKTAIL ~~LOC~~: 30.0000 mL | Freq: Four times a day (QID) | ORAL | Status: DC | PRN

## 2015-12-27 MED ORDER — TRAMADOL HCL 50 MG PO TABS: 50.0000 mg | ORAL_TABLET | Freq: Four times a day (QID) | ORAL | Status: DC | PRN

## 2015-12-27 MED ORDER — ASPIRIN EC 325 MG PO TBEC
325.0000 mg | DELAYED_RELEASE_TABLET | Freq: Every day | ORAL | Status: DC
  Administered 2015-12-27 – 2015-12-30 (×4): 325 mg via ORAL
  Filled 2015-12-27 (×4): qty 1

## 2015-12-27 NOTE — ED Notes (Signed)
c-t has called about iv on this pt for study

## 2015-12-27 NOTE — ED Notes (Signed)
The pt is c/o some chest pain  She reports that the sl nitro given has helped the pain

## 2015-12-27 NOTE — H&P (Signed)
History and Physical    Alhana Cortorreal [id number] DOB: Dec 07, 1940 DOA: 12/27/2015   PCP: Arnette Norris, MD   Patient coming from:  Home  Chief Complaint: Chest Pain   HPI: Heather Shaw is a 75 y.o. female with medical history significant for HTN, HLD, chronic intermittent vertigo  presenting with substernal chest pain since this morning, accompanied by diaphoresis with radiation to the  left arm, described as 6/10.Pain notworsened with deep inspiration, movement or exertion. She is not on ASA but she did take 4 baby ASA during transport, and NTG without significant relief. Denies any dizziness or falls. Reports some acute  dyspnea, worse on exertion over the last 2 days . Denies any cough. Denies any fever or chills. Denies any nausea, vomiting or abdominal pain but has been having more bloating. She also has known gastritis f/u at [place] is normal. Denies any leg swelling or calf pain. Denies any headaches or vision changes. Denies any seizures or confusion. Never had a catheterization. She does see a cardiologist, Dr. Martinique for HTN   No recent long distance trips. Denies any new stressors. No new meds. Not on hormonal therapy. No new herbal supplements. Does not smoke No ETOH or recreational drugs.      ED Course:  BP 136/69 mmHg  Pulse 50  Temp(Src) 98.1 F (36.7 C) (Oral)  Resp 19  Ht 5' 4.5" (1.638 m)  Wt 94.802 kg (209 lb)  BMI 35.33 kg/m2  SpO2 97%  received ASA and Nitroglycerin. EKG shows Sinus rhythm Left anterior fascicular block Abnormal R-wave progression, late transition, QTC 448 . CMET and CBC are essentially normal. Troponins 0.02, 0.01. Dimer at 2.36. CTA pending   Review of Systems: As per HPI otherwise 10 point review of systems negative.   Past Medical History  Diagnosis Date  . Hyperlipidemia   . Hypertension   . Vertigo   . DDD (degenerative disc disease), lumbosacral   . Arthritis   . IC (interstitial cystitis)   . PONV (postoperative nausea  and vomiting)   . Bladder pain   . Uterine fibroid   . Wears glasses   . Wears partial dentures   . Polyp of rectum   . Internal hemorrhoids   . Hyperplastic colon polyp   . Squamous papilloma     in esophageal polyp    Past Surgical History  Procedure Laterality Date  . Knee arthroscopy Right 11-18-2009  . Cysto/  hydrodistention/  instillation clorpactic  09-21-2010  . Dilation and curettage of uterus  1988  . Colonoscopy w/ polypectomy  2009  . Cysto with hydrodistension N/A 05/20/2014    Procedure: CYSTOSCOPY/HYDRODISTENSION, INSTILLATION OF CHLORPACTIN;  Surgeon: Bernestine Amass, MD;  Location: [facility];  Service: Urology;  Laterality: N/A;    Social History  reports that she quit smoking about 44 years ago. Her smoking use included Cigarettes. She has never used smokeless tobacco. She reports that she does not drink alcohol or use illicit drugs.  Walks unassisted  with cane  with walker Patient is on  wheelchair  Allergies  Allergen Reactions  . Allegra [Fexofenadine] Other (See Comments)    Makes pt nervous  . Codeine Nausea And Vomiting  . Prednisone     Causes elevation in blood pressure- ? All steroids cause same reaction  . Zithromax [Azithromycin] Other (See Comments)    Burns stomach  . Sulfa Antibiotics Rash    Family History  Problem Relation Age of Onset  .  COPD Father   . Heart attack Father   . Emphysema Father   . Stroke Paternal Aunt   . Clotting disorder Mother       Prior to Admission medications   Medication Sig Start Date End Date Taking? Authorizing Provider  acetaminophen (TYLENOL) 500 MG tablet Take 1,000 mg by mouth as needed for mild pain.    Yes Historical Provider, MD  atenolol (TENORMIN) 25 MG tablet TAKE 1 TABLET BY MOUTH EVERY DAY 12/09/15  Yes Josue Hector, MD  fluocinonide-emollient (LIDEX-E) 0.05 % cream Apply 1 application topically 2 (two) times daily. 07/18/15  Yes Lucille Passy, MD  hydrochlorothiazide  (HYDRODIURIL) 25 MG tablet TAKE 1 TABLET BY MOUTH EVERY DAY 03/24/15  Yes Lucille Passy, MD  lisinopril (PRINIVIL,ZESTRIL) 40 MG tablet TAKE 1 TABLET BY MOUTH EVERY DAY 04/28/15  Yes Lucille Passy, MD  magnesium hydroxide (MILK OF MAGNESIA) 400 MG/5ML suspension Take by mouth daily as needed for mild constipation.   Yes Historical Provider, MD  meclizine (ANTIVERT) 12.5 MG tablet Take 1 tablet (12.5 mg total) by mouth 3 (three) times daily as needed for dizziness. 02/12/15  Yes Lucille Passy, MD  polyethylene glycol powder (GLYCOLAX/MIRALAX) powder Dissolve 17 grams in at least 8 ounces water/juice and drink once daily 12/24/15  Yes Jerene Bears, MD  traMADol (ULTRAM) 50 MG tablet TAKE 1 TABLET BY MOUTH EVERY 6 HOURS AS NEEDED FOR PAIN 12/17/15  Yes Lucille Passy, MD    Physical Exam:    Filed Vitals:   12/27/15 1445 12/27/15 1500 12/27/15 1515 12/27/15 1545  BP: 119/53 125/62 132/63 136/69  Pulse: 53 56 56 50  Temp:      TempSrc:      Resp: 13 14 16 19   Height:      Weight:      SpO2: 98% 98% 97% 97%      Constitutional: NAD, calm, comfortable Filed Vitals:   12/27/15 1445 12/27/15 1500 12/27/15 1515 12/27/15 1545  BP: 119/53 125/62 132/63 136/69  Pulse: 53 56 56 50  Temp:      TempSrc:      Resp: 13 14 16 19   Height:      Weight:      SpO2: 98% 98% 97% 97%   Eyes: PERRL, lids and conjunctivae normal ENMT: Mucous membranes are moist. Posterior pharynx clear of any exudate or lesions.Normal dentition.  Neck: normal, supple, no masses, no thyromegaly Respiratory: clear to auscultation bilaterally, no wheezing, no crackles. Normal respiratory effort. No accessory muscle use.  Cardiovascular:  Loletha Grayer, Regular rate and rhythm, no murmurs /? rubs / gallops. No extremity edema. 2+ pedal pulses. No carotid bruits.  Abdomen: no tenderness, no masses palpated. No hepatosplenomegaly. Bowel sounds positive.  Musculoskeletal: no clubbing / cyanosis. No joint deformity upper and lower  extremities. Good ROM, no contractures. Normal muscle tone.  Skin: no rashes, lesions, ulcers.  Neurologic: CN 2-12 grossly intact. Sensation intact, DTR normal. Strength 5/5 in all 4.  Psychiatric: Normal judgment and insight. Alert and oriented x 3. Normal mood.     Labs on Admission: I have personally reviewed following labs and imaging studies  CBC:  Recent Labs Lab 12/27/15 1338  WBC 6.3  HGB 12.9  HCT 38.1  MCV 83.9  PLT [id number]    Basic Metabolic Panel:  Recent Labs Lab 12/27/15 1338  NA 137  K 3.5  CL 104  CO2 25  GLUCOSE 118*  BUN 12  CREATININE 0.65  CALCIUM 10.1    GFR: Estimated Creatinine Clearance: 71.7 mL/min (by C-G formula based on Cr of 0.65).   Urine analysis:    Component Value Date/Time   COLORURINE YELLOW 10/02/2015 2157   APPEARANCEUR CLEAR 10/02/2015 2157   LABSPEC 1.022 10/02/2015 2157   PHURINE 6.5 10/02/2015 2157   GLUCOSEU NEGATIVE 10/02/2015 2157   HGBUR NEGATIVE 10/02/2015 2157   HGBUR negative 09/17/2010 0851   BILIRUBINUR NEGATIVE 10/02/2015 2157   [place] NEGATIVE 10/02/2015 2157   PROTEINUR NEGATIVE 10/02/2015 2157   UROBILINOGEN 0.2 09/17/2010 0851   NITRITE NEGATIVE 10/02/2015 2157   LEUKOCYTESUR SMALL* 10/02/2015 2157    Sepsis Labs: @LABRCNTIP (procalcitonin:4,lacticidven:4) )No results found for this or any previous visit (from the past 240 hour(s)).   Radiological Exams on Admission: Dg Chest 2 View  12/27/2015  CLINICAL DATA:  Mid chest pain. Radiating into left shoulder and left arm. Shortness of breath for 2-3 days. EXAM: CHEST  2 VIEW COMPARISON:  12/18/2010 FINDINGS: Heart and mediastinal contours are within normal limits. No focal opacities or effusions. No acute bony abnormality. IMPRESSION: No active cardiopulmonary disease. Electronically Signed   By: Rolm Baptise M.D.   On: 12/27/2015 14:25    EKG: Independently reviewed.  Assessment/Plan Active Problems:   Chest pain    Chest pain syndrome cardiac  versus PE vs GI  HEART score 5 . Troponin 0.02 , EKG without evidence of ACS, QTC 448 . CMET and CBC are essentially normal.  Dimer at 2.36. CTA pending.  CP unresolved by nitroglycerin, aspirin. CXR unrevealing. Risk factors are strong fam hx of cardiac disease and DVT/PE in mother (died at childbirth age 59), hyperlipidemia, hypertension.  - Admit to Telemetry/ Observation Cycle troponin  continue ASA, O2 and NTG as needed GI cocktail Check Lipid panel  Hb A1C Lovenox  Abnormal heart sounds/ Bradycardia Check 2 D echo   Hypertension BP 136/69 mmHg  Controlled Continue home anti-hypertensive medications.  Add Hydralazine Q6 hours as needed for BP 160/90     Hyperlipidemia Continue home statins  Chronic benign vertigo Continue Meclizine prn   DVT prophylaxis: Lovenox Code Status:   Full   Family Communication:  Discussed with husband  Disposition Plan: Expect patient to be discharged to home after condition improves Consults called:    None Admission status:Tele  Obs   Ab Leaming E, PA-C [facility]   If 7PM-7AM, please contact night-coverage [email or web address] Password [facility]  12/27/2015, 4:41 PM

## 2015-12-27 NOTE — ED Provider Notes (Signed)
CSN: [id number]     Arrival date & time 12/27/15  1307 History   First MD Initiated Contact with Patient 12/27/15 1320     Chief Complaint  Patient presents with  . Chest Pain     (Consider location/radiation/quality/duration/timing/severity/associated sxs/prior Treatment) HPI Patient presents to the emergency department with chest pain that started last night.  The patient, states she has had chest pain that radiates to her posterior upper shoulder and left arm.  The patient, states she has also had some radiation into her left neck.  Patient states she took Tums with no relief of her symptoms.  Patient states she did not take any other medications prior to arrival.  Patient states she has had some mild shortness of breath.  She states the pain is worse with activity. The patient denies  headache,blurred vision, neck pain, fever, cough, weakness, numbness, dizziness, anorexia, edema, abdominal pain, nausea, vomiting, diarrhea, rash, back pain, dysuria, hematemesis, bloody stool, near syncope, or syncope. Past Medical History  Diagnosis Date  . Hyperlipidemia   . Hypertension   . Vertigo   . DDD (degenerative disc disease), lumbosacral   . Arthritis   . IC (interstitial cystitis)   . PONV (postoperative nausea and vomiting)   . Bladder pain   . Uterine fibroid   . Wears glasses   . Wears partial dentures   . Polyp of rectum   . Internal hemorrhoids   . Hyperplastic colon polyp   . Squamous papilloma     in esophageal polyp   Past Surgical History  Procedure Laterality Date  . Knee arthroscopy Right 11-18-2009  . Cysto/  hydrodistention/  instillation clorpactic  09-21-2010  . Dilation and curettage of uterus  1988  . Colonoscopy w/ polypectomy  2009  . Cysto with hydrodistension N/A 05/20/2014    Procedure: CYSTOSCOPY/HYDRODISTENSION, INSTILLATION OF CHLORPACTIN;  Surgeon: Bernestine Amass, MD;  Location: [facility];  Service: Urology;  Laterality: N/A;   Family  History  Problem Relation Age of Onset  . COPD Father   . Heart attack Father   . Emphysema Father   . Stroke Paternal Aunt   . Clotting disorder Mother    Social History  Substance Use Topics  . Smoking status: Former Smoker    Types: Cigarettes    Quit date: 05/17/1971  . Smokeless tobacco: Never Used  . Alcohol Use: No   OB History    No data available     Review of Systems  All other systems negative except as documented in the HPI. All pertinent positives and negatives as reviewed in the HPI.  Allergies  Allegra; Codeine; Prednisone; Zithromax; and Sulfa antibiotics  Home Medications   Prior to Admission medications   Medication Sig Start Date End Date Taking? Authorizing Provider  acetaminophen (TYLENOL) 500 MG tablet Take 1,000 mg by mouth as needed for mild pain.    Yes Historical Provider, MD  atenolol (TENORMIN) 25 MG tablet TAKE 1 TABLET BY MOUTH EVERY DAY 12/09/15  Yes Josue Hector, MD  fluocinonide-emollient (LIDEX-E) 0.05 % cream Apply 1 application topically 2 (two) times daily. 07/18/15  Yes Lucille Passy, MD  hydrochlorothiazide (HYDRODIURIL) 25 MG tablet TAKE 1 TABLET BY MOUTH EVERY DAY 03/24/15  Yes Lucille Passy, MD  lisinopril (PRINIVIL,ZESTRIL) 40 MG tablet TAKE 1 TABLET BY MOUTH EVERY DAY 04/28/15  Yes Lucille Passy, MD  magnesium hydroxide (MILK OF MAGNESIA) 400 MG/5ML suspension Take by mouth daily as needed for mild  constipation.   Yes Historical Provider, MD  meclizine (ANTIVERT) 12.5 MG tablet Take 1 tablet (12.5 mg total) by mouth 3 (three) times daily as needed for dizziness. 02/12/15  Yes Lucille Passy, MD  polyethylene glycol powder (GLYCOLAX/MIRALAX) powder Dissolve 17 grams in at least 8 ounces water/juice and drink once daily 12/24/15  Yes Jerene Bears, MD  traMADol (ULTRAM) 50 MG tablet TAKE 1 TABLET BY MOUTH EVERY 6 HOURS AS NEEDED FOR PAIN 12/17/15  Yes Lucille Passy, MD   BP 157/64 mmHg  Pulse 54  Temp(Src) 98.1 F (36.7 C) (Oral)  Resp 13   Ht 5' 4.5" (1.638 m)  Wt 94.802 kg  BMI 35.33 kg/m2  SpO2 96% Physical Exam  Constitutional: She is oriented to person, place, and time. She appears well-developed and well-nourished. No distress.  HENT:  Head: Normocephalic and atraumatic.  Mouth/Throat: Oropharynx is clear and moist.  Eyes: Pupils are equal, round, and reactive to light.  Neck: Normal range of motion. Neck supple.  Cardiovascular: Normal rate, regular rhythm and normal heart sounds.  Exam reveals no gallop and no friction rub.   No murmur heard. Pulmonary/Chest: Effort normal and breath sounds normal. No respiratory distress. She has no wheezes.  Abdominal: Soft. Bowel sounds are normal. She exhibits no distension. There is no tenderness.  Neurological: She is alert and oriented to person, place, and time. She exhibits normal muscle tone. Coordination normal.  Skin: Skin is warm and dry. No rash noted. No erythema.  Psychiatric: She has a normal mood and affect. Her behavior is normal.  Nursing note and vitals reviewed.   ED Course  Procedures (including critical care time) Labs Review Labs Reviewed  BASIC METABOLIC PANEL - Abnormal; Notable for the following:    Glucose, Bld 118 (*)    All other components within normal limits  CBC  D-DIMER, QUANTITATIVE (NOT AT [facility])  Randolm Idol, ED    Imaging Review Dg Chest 2 View  12/27/2015  CLINICAL DATA:  Mid chest pain. Radiating into left shoulder and left arm. Shortness of breath for 2-3 days. EXAM: CHEST  2 VIEW COMPARISON:  12/18/2010 FINDINGS: Heart and mediastinal contours are within normal limits. No focal opacities or effusions. No acute bony abnormality. IMPRESSION: No active cardiopulmonary disease. Electronically Signed   By: Rolm Baptise M.D.   On: 12/27/2015 14:25   I have personally reviewed and evaluated these images and lab results as part of my medical decision-making.   EKG Interpretation   Date/Time:  Saturday December 27 2015 13:12:27  EDT Ventricular Rate:  73 PR Interval:  166 QRS Duration: 89 QT Interval:  407 QTC Calculation: 448 R Axis:   -61 Text Interpretation:  Sinus rhythm Left anterior fascicular block Abnormal  R-wave progression, late transition Confirmed by RAY MD, Andee Poles [id number])  on 12/27/2015 2:35:02 PM      MDM   Final diagnoses:  None    I spoke with the[facility] for admission patient be placed in observation for chest pain.  She does have an elevated d-dimer, we will scan her chest.  Patient is advised plan and all questions were answered    Dalia Heading, PA-C 12/27/15 [place], MD 12/27/15 [phone]

## 2015-12-27 NOTE — ED Notes (Signed)
The pt has an iv in her a-c  Will be going to c-t   soon

## 2015-12-27 NOTE — ED Notes (Signed)
Per EMS, Pt is coming from home with complaints of central chest pain radiating to the left arm and shoulder. Pt states the pain started last night with some diaphoresis and became worse this morning with nausea. Pt is alert and oriented x4 upon arrival. During transport, patient was given 2 of Nitro with decrease in 10/10 pain to 6/10. Pt reports dizziness at this time. Hx of HTN. Vitals per EMS before Nitro: 190/120, Irregular HR of 110-70 SR, 98% on RA,

## 2015-12-27 NOTE — Progress Notes (Signed)
Pt educated on fall and safety plan. Pt refuses to have bed alarm on. [facility] [employer]

## 2015-12-27 NOTE — ED Notes (Signed)
Report called to rn on 3w    Will give pain meds

## 2015-12-28 ENCOUNTER — Observation Stay (HOSPITAL_COMMUNITY): Payer: Medicare Other

## 2015-12-28 ENCOUNTER — Encounter (HOSPITAL_COMMUNITY): Payer: Self-pay | Admitting: Cardiology

## 2015-12-28 DIAGNOSIS — E785 Hyperlipidemia, unspecified: Secondary | ICD-10-CM | POA: Diagnosis not present

## 2015-12-28 DIAGNOSIS — I10 Essential (primary) hypertension: Secondary | ICD-10-CM | POA: Diagnosis not present

## 2015-12-28 DIAGNOSIS — R072 Precordial pain: Secondary | ICD-10-CM

## 2015-12-28 DIAGNOSIS — R079 Chest pain, unspecified: Secondary | ICD-10-CM | POA: Diagnosis not present

## 2015-12-28 LAB — TROPONIN I
Troponin I: <0.03 ng/mL (ref <0.031)
Troponin I: <0.03 ng/mL (ref <0.031)
Troponin I: <0.03 ng/mL (ref <0.031)

## 2015-12-28 LAB — NM MYOCAR MULTI W/SPECT W/WALL MOTION / EF
CHL CUP MPHR: 148 {beats}/min
CHL CUP RESTING HR STRESS: 69 {beats}/min
CSEPEDS: 0 s
Estimated workload: 1 METS
Exercise duration (min): 0 min
Peak HR: 130 {beats}/min
Percent HR: 87 %

## 2015-12-28 MED ORDER — LISINOPRIL 40 MG PO TABS
40.0000 mg | ORAL_TABLET | Freq: Every day | ORAL | Status: DC
  Administered 2015-12-28: 40 mg via ORAL
  Filled 2015-12-28: qty 1

## 2015-12-28 MED ORDER — HYDROCODONE-ACETAMINOPHEN 7.5-325 MG PO TABS
1.0000 | ORAL_TABLET | Freq: Two times a day (BID) | ORAL | Status: DC | PRN
  Administered 2015-12-28 – 2015-12-29 (×3): 1 via ORAL
  Filled 2015-12-28 (×4): qty 1

## 2015-12-28 MED ORDER — FAMOTIDINE IN NACL 20-0.9 MG/50ML-% IV SOLN
20.0000 mg | Freq: Two times a day (BID) | INTRAVENOUS | Status: DC
  Administered 2015-12-28 – 2015-12-29 (×4): 20 mg via INTRAVENOUS
  Filled 2015-12-28 (×4): qty 50

## 2015-12-28 MED ORDER — DIPHENHYDRAMINE HCL 25 MG PO CAPS
25.0000 mg | ORAL_CAPSULE | Freq: Four times a day (QID) | ORAL | Status: DC | PRN
  Administered 2015-12-28: 25 mg via ORAL
  Filled 2015-12-28: qty 1

## 2015-12-28 MED ORDER — TECHNETIUM TC 99M TETROFOSMIN IV KIT
30.0000 | PACK | Freq: Once | INTRAVENOUS | Status: AC | PRN
  Administered 2015-12-28: 30 via INTRAVENOUS

## 2015-12-28 MED ORDER — FLUOCINONIDE-E 0.05 % EX CREA
1.0000 "application " | TOPICAL_CREAM | Freq: Two times a day (BID) | CUTANEOUS | Status: DC
  Filled 2015-12-28: qty 15

## 2015-12-28 MED ORDER — TECHNETIUM TC 99M TETROFOSMIN IV KIT
10.0000 | PACK | Freq: Once | INTRAVENOUS | Status: AC | PRN
  Administered 2015-12-28: 10 via INTRAVENOUS

## 2015-12-28 MED ORDER — LORAZEPAM 0.5 MG PO TABS: 0.5000 mg | ORAL_TABLET | Freq: Once | ORAL | Status: DC

## 2015-12-28 MED ORDER — LORAZEPAM 0.5 MG PO TABS
0.5000 mg | ORAL_TABLET | Freq: Three times a day (TID) | ORAL | Status: DC | PRN
  Administered 2015-12-28: 0.5 mg via ORAL
  Filled 2015-12-28: qty 1

## 2015-12-28 MED ORDER — REGADENOSON 0.4 MG/5ML IV SOLN
0.4000 mg | Freq: Once | INTRAVENOUS | Status: AC
  Administered 2015-12-28: 0.4 mg via INTRAVENOUS
  Filled 2015-12-28: qty 5

## 2015-12-28 MED ORDER — DIPHENHYDRAMINE HCL 12.5 MG/5ML PO ELIX
25.0000 mg | ORAL_SOLUTION | Freq: Four times a day (QID) | ORAL | Status: DC | PRN
Start: 2015-12-28 — End: 2015-12-30

## 2015-12-28 MED ORDER — DIPHENHYDRAMINE HCL 50 MG/ML IJ SOLN
25.0000 mg | Freq: Once | INTRAMUSCULAR | Status: AC
  Administered 2015-12-28: 25 mg via INTRAVENOUS
  Filled 2015-12-28: qty 1

## 2015-12-28 MED ORDER — REGADENOSON 0.4 MG/5ML IV SOLN
INTRAVENOUS | Status: AC
  Administered 2015-12-28: 0.4 mg via INTRAVENOUS
  Filled 2015-12-28: qty 5

## 2015-12-28 MED ORDER — METHYLPREDNISOLONE SODIUM SUCC 125 MG IJ SOLR
60.0000 mg | Freq: Once | INTRAMUSCULAR | Status: AC
  Administered 2015-12-28: 60 mg via INTRAVENOUS
  Filled 2015-12-28: qty 2

## 2015-12-28 NOTE — Progress Notes (Signed)
Patient complaining of 7/10 chronic leg pain in bilateral lower extremities and feet.  Per patient constant ache.  Per patient takes Norco and Tramadol intermittently at home for pain.  Norco currently ordered, per last administration too early.  RN paged [facility] with this information.

## 2015-12-28 NOTE — Consult Note (Signed)
Cardiology Consult    Patient ID: Heather Shaw MRN: [id number], DOB/AGE: 1940-12-22   Admit date: 12/27/2015 Date of Consult: 12/28/2015  Primary Physician: Arnette Norris, MD Primary Cardiologist: Dr. Johnsie Cancel Requesting Provider: Dr. Merrell/Internal Medicine  Patient Profile    75 yo female with PMH of HTN/HLD/DDD/Vertigo and interstitial cystitis who presented to the [facility] ED on 12/27/2015 with reports of chest pain with diaphoresis and radiation into the left arm.   Past Medical History   Past Medical History  Diagnosis Date  . Hyperlipidemia   . Hypertension   . Vertigo   . DDD (degenerative disc disease), lumbosacral   . Arthritis   . IC (interstitial cystitis)   . PONV (postoperative nausea and vomiting)   . Bladder pain   . Uterine fibroid   . Wears glasses   . Wears partial dentures   . Polyp of rectum   . Internal hemorrhoids   . Hyperplastic colon polyp   . Squamous papilloma     in esophageal polyp    Past Surgical History  Procedure Laterality Date  . Knee arthroscopy Right 11-18-2009  . Cysto/  hydrodistention/  instillation clorpactic  09-21-2010  . Dilation and curettage of uterus  1988  . Colonoscopy w/ polypectomy  2009  . Cysto with hydrodistension N/A 05/20/2014    Procedure: CYSTOSCOPY/HYDRODISTENSION, INSTILLATION OF CHLORPACTIN;  Surgeon: Bernestine Amass, MD;  Location: [facility];  Service: Urology;  Laterality: N/A;     Allergies  Allergies  Allergen Reactions  . Allegra [Fexofenadine] Other (See Comments)    Makes pt nervous  . Codeine Nausea And Vomiting  . Prednisone     Causes elevation in blood pressure- ? All steroids cause same reaction  . Zithromax [Azithromycin] Other (See Comments)    Burns stomach  . Sulfa Antibiotics Rash    History of Present Illness    Heather Shaw is a 75 yo female patient of Dr. Mariana Arn with a PMH of HTN/HLD/DDD/Vertigo and interstitial cystitis. She was initially referred to the  office for evaluation of her HTN, and was last seen in the office on 11/2014 and was doing well at that visit. No changes were made to her medications. States she has never had an invasive cardiac work up in the past, but did have a stress test many years ago at [facility] with normal results.  Currently lives in her home with her husband, and cares for herself. Is fairly active, and continues to drive. Has noticed over the past couple of months some dyspnea with everyday activity, but has not paid much attention to this, thinking it was more related to her DDD "slowing her down". Reports about Wednesday of last week she, started not "feeling well" and a little SOB at home. This was present both with and without activity. Continued until Friday night when she began to develop left sided chest pressure. This persisted throughout the night intermittently and kept her from sleeping well. Saturday morning, she states the chest pressure/pain woke her from sleep with radiation into the left arm and upper back, along with dyspnea. Also felt sharp pain with deep inspiration. At this time, she called for transport to the ED. She was given 2 SL nitro with EMS and had some relief. Noted to be hypertensive with EMS with a pressure of 190/120, improved with the nitro. She was given IV morphine which did alleviate her pain in the ED.     In the ED her labs  showed Cr 0.65, Hgb 12.9, Trop neg x2, but elevated D-Dimer 2.36. CTA was negative for PE, and chest x-ray showed no acute findings. EKG shows SR with no ST/T wave abnormalities. She was admitted to Internal Medicine for further work up.  Cardiology has been consulted related to patients report of chest pain.   Inpatient Medications    . aspirin EC  325 mg Oral Daily  . atenolol  25 mg Oral Daily  . enoxaparin (LOVENOX) injection  40 mg Subcutaneous Q24H  . hydrochlorothiazide  25 mg Oral Daily  . lisinopril  40 mg Oral Daily    Family History    Family  History  Problem Relation Age of Onset  . COPD Father   . Heart attack Father 66  . Emphysema Father   . Stroke Paternal Aunt   . Clotting disorder Mother 66  . Hypertension Sister   . Hypertension Brother     Social History    Social History   Social History  . Marital Status: Married    Spouse Name: N/A  . Number of Children: 6  . Years of Education: N/A   Occupational History  . housewife    Social History Main Topics  . Smoking status: Former Smoker    Types: Cigarettes    Quit date: 05/17/1971  . Smokeless tobacco: Never Used  . Alcohol Use: No  . Drug Use: No  . Sexual Activity: No   Other Topics Concern  . Not on file   Social History Narrative     Review of Systems    General:  No chills, fever, night sweats or weight changes.  Cardiovascular:  + chest pain, + dyspnea on exertion, edema, orthopnea, palpitations, paroxysmal nocturnal dyspnea. Dermatological: No rash, lesions/masses Respiratory: No cough, + dyspnea Urologic: No hematuria, dysuria Abdominal:   No nausea, vomiting, diarrhea, bright red blood per rectum, melena, or hematemesis Neurologic:  No visual changes, wkns, changes in mental status. All other systems reviewed and are otherwise negative except as noted above.  Physical Exam    Blood pressure 142/56, pulse 72, temperature 98.3 F (36.8 C), temperature source Oral, resp. rate 18, height 5\' 4"  (1.626 m), weight 206 lb 5.6 oz (93.6 kg), SpO2 97 %.  General: Pleasant older female, NAD Psych: Normal affect. Neuro: Alert and oriented X 3. Moves all extremities spontaneously. HEENT: Normal  Neck: Supple without bruits or JVD. Lungs:  Resp regular and unlabored, CTA. Heart: RRR no s3, s4, or murmurs. Abdomen: Soft, non-tender, non-distended, BS + x 4.  Extremities: No clubbing, cyanosis or edema. DP/PT/Radials 2+ and equal bilaterally.  Labs    Troponin [facility] of Care Test)  Recent Labs  12/27/15 1346  TROPIPOC 0.02    Recent  Labs  12/28/15 0307 12/28/15 0704  TROPONINI <0.03 <0.03   Lab Results  Component Value Date   WBC 6.3 12/27/2015   HGB 12.9 12/27/2015   HCT 38.1 12/27/2015   MCV 83.9 12/27/2015   PLT 182 12/27/2015     Recent Labs Lab 12/27/15 1338  NA 137  K 3.5  CL 104  CO2 25  BUN 12  CREATININE 0.65  CALCIUM 10.1  GLUCOSE 118*   Lab Results  Component Value Date   CHOL 182 02/12/2015   HDL 33.50* 02/12/2015   LDLCALC 122* 11/08/2013   TRIG 251.0* 02/12/2015   Lab Results  Component Value Date   DDIMER 2.36* 12/27/2015     Radiology Studies    Dg Chest 2  View  12/27/2015  CLINICAL DATA:  Mid chest pain. Radiating into left shoulder and left arm. Shortness of breath for 2-3 days. EXAM: CHEST  2 VIEW COMPARISON:  12/18/2010 FINDINGS: Heart and mediastinal contours are within normal limits. No focal opacities or effusions. No acute bony abnormality. IMPRESSION: No active cardiopulmonary disease. Electronically Signed   By: Rolm Baptise M.D.   On: 12/27/2015 14:25   Ct Angio Chest Pe W/cm &/or Wo Cm  12/27/2015  CLINICAL DATA:  Chest pain with nausea and dizziness EXAM: CT ANGIOGRAPHY CHEST WITH CONTRAST TECHNIQUE: Multidetector CT imaging of the chest was performed using the standard protocol during bolus administration of intravenous contrast. Multiplanar CT image reconstructions and MIPs were obtained to evaluate the vascular anatomy. CONTRAST:  80 mL Isovue 370 nonionic COMPARISON:  Chest CT January 27, 2006; chest radiograph December 27, 2015 FINDINGS: Mediastinum/Lymph Nodes: There is no demonstrable pulmonary embolus. There is no thoracic aortic aneurysm or dissection. Visualized great vessels appear unremarkable. There are scattered foci of atherosclerotic calcification in the aorta. There are scattered foci of coronary artery calcification. The pericardium is not appreciably thickened. Thyroid appears unremarkable. There are scattered small mediastinal lymph nodes. There is a lymph node  in the sub- carinal region measuring 1.6 x 0.9 cm. There is a lymph node in the right hilum measuring 1.4 x 1.2 cm. There is a lymph node in the left hilum measuring 1.0 x 0.8 cm. These lymph nodes appear stable. Lungs/Pleura: There is mild atelectatic change in the lung bases. There is also mild atelectasis in the inferior lingula. There is scarring in both lung apices with several scattered small bullae in the lung apices. There is no edema or consolidation. Upper abdomen: In the visualized upper abdomen, there is evidence of reflux of contrast into the inferior vena cava and hepatic veins. Visualized upper abdominal structures otherwise appear unremarkable. Musculoskeletal: There is degenerative change in the thoracic spine. There are no blastic or lytic bone lesions. Review of the MIP images confirms the above findings. IMPRESSION: No demonstrable pulmonary embolus. Scattered foci of coronary artery calcification noted. Several borderline prominent lymph nodes are stable compared to prior study from 2007. Areas of mild lung scarring and atelectasis present. No parenchymal lung edema or consolidation. Reflux of contrast into the inferior vena cava and hepatic veins may indicate elevated right heart pressure. Electronically Signed   By: Lowella Grip III M.D.   On: 12/27/2015 17:49    ECG & Cardiac Imaging    EKG: SR Rate 73 No Acute ST/T wave changes.   Echo:   Pending.   Assessment & Plan    Heather Shaw is a 75 yo female patient of Dr. Mariana Arn with a PMH of HTN/HLD/DDD/Vertigo and interstitial cystitis. . States she has never had an invasive cardiac work up in the past, but did have a stress test many years ago at [facility] with normal results. Currently lives in her home with her husband, and cares for herself. Is fairly active, and continues to drive. Has noticed over the past couple of months some dyspnea with everyday activity, but has not paid much attention to this, thinking it was more  related to her DDD "slowing her down". Reports about Wednesday of last week she, started not "feeling well" and a little SOB at home. This was present both with and without activity. Continued until Friday night when she began to develop left sided chest pressure. This persisted throughout the night intermittently and kept her from  sleeping well. Saturday morning, she states the chest pressure/pain woke her from sleep with radiation into the left arm and upper back, along with dyspnea. Also felt sharp pain with deep inspiration. At this time, she called for transport to the ED. She was given 2 SL nitro with EMS and had some relief. Noted to be hypertensive with EMS with a pressure of 190/120, improved with the nitro. She was given IV morphine which did alleviate her pain in the ED.   In the ED her labs showed Cr 0.65, Hgb 12.9, Trop neg x2, but elevated D-Dimer 2.36. CTA was negative for PE, and chest x-ray showed no acute findings. EKG shows SR with no ST/T wave abnormalities. She was admitted to Internal Medicine for further work up.  1. Chest pain: She reports having another episode of chest pain/pressure last night and given IV morphine with relief. Was lying in the bed when this pain occurred.  --She does have risk factors including HTN, HLD, 20 pack-year hx of smoking, and family hx. She does present with both typical/atypical findings in her reports of pain, and pain is present with rest along with activity. Has already received a CTA with rule out of PE, given her reports of pain with inspiration. --I believe she would benefit from a myoview, and she feels she could not walk for treadmill stress given her DDD and chronic right knee pain.  2. HTN: Initially reported to by very hypertensive on arrival to the ED, but this has improved --Continue home medications  3. HLD: Last Lipid panel was 2016. HDL 33 and Trig 251. Does not appear to be on any home statin?  --Check lipid panel and Hgb  A1c   Signed, Reino Bellis, NP-C Pager [phone] 12/28/2015, 8:29 AM  I have seen and examined the patient along with Reino Bellis, NP-C.  I have reviewed the chart, notes and new data.  I agree with NP's note.  Key new complaints: Chest pain syndrome is atypical, more positional than exertional Key examination changes: no clinical signs of CHF, normal CV exam Key new findings / data: low risk ECG and enzymes; lipid profile consistent with the metabolic sd., LDL mildly elevated. Minimal calcified coronary plaque ion CTA.  PLAN: Suspect the elevated BP was secondary to pain rather than vice-versa. In view of her risk factors, Lexiscan Myoview is appropriate. If negative, may need evaluation for cervical/thoracic spine disease as cause of her complaints. Note relatively benign recent EGD results.  Sanda Klein, MD, [place] [phone] 12/28/2015, 9:19 AM

## 2015-12-28 NOTE — Progress Notes (Signed)
Patient's daughter called RN to notify patient's lips swelling.  RN into room and patients lips moderately swollen compared to baseline per patient's daughter.  Swelling in patient's face as well per patient and patient's daughter.  Patient denies shortness of breath, denies abnormal feeling in throat.  [facility] paged.  Orders entered per on call.  Per patient has excitability with Benadryl.  Patient does have order for PRN Ativan, based on last administration too early to give now.  RN paged [facility] with this information and ordered entered per on call.

## 2015-12-28 NOTE — Progress Notes (Signed)
PROGRESS NOTE  Heather Shaw  [id number] DOB: 10/19/1940  DOA: 12/27/2015 PCP: Arnette Norris, MD   Brief Narrative:  75 year old female patient with PMH of HLD, HTN, chronic intermittent vertigo, interstitial cystitis, presented to [facility] ED on 12/27/15 with complaints of progressive DOE over a couple of months, 2 days history of intermittent left-sided chest pressure, woke up on night of admission with worsening chest pain with radiation to left upper extremity and dyspnea. Pain worsened by deep inspiration. EMS activated. Some relief with sublingual NTG and hypertensive with PMS 190/120. Cardiology consulted.   Assessment & Plan:   Active Problems:   HLD (hyperlipidemia)   Essential hypertension   Chest pain   Bradycardia   Chest pain - Mostly atypical and some typical features. CAD risk factors. EKG without acute findings. Troponin 3: Negative. CTA chest: No PE. Scattered foci of coronary artery calcification. No edema or consolidation. - Cardiology was consulted and underwent nuclear stress test-results pending. As per cardiology, chest pain syndrome is atypical and more positional than exertional.  -No recurrence of chest pain.  Skin rash/Urtricaria - Patient stated that she developed pruritic skin rash after she received something in the IV in ED last night (cannot say if it was morphine or contrast for CT). Symptoms worsened today. Denies exposure to any new medications, diet or toiletry. - Unclear etiology. ? Contrast ? New meds. Occurred after coming to ED and before stress test. Discontinued all newly started medications (morphine, Lovenox, GI cocktail and subcutaneous heparin). Trial of oral Benadryl, IV Pepcid and monitor closely. Patient hesitant to try steroids because she states that she has had very high blood pressures and had to come to the ED twice after taking steroids. Sees dermatologist for? Seborrheic dermatitis. Monitor   Essential hypertension - Elevated blood  pressure in the ED was probably precipitated by pain. Better controlled now. Continue atenolol, lisinopril and HCTZ (all prior home medications and not new)  Hyperlipidemia - Check fasting lipids and A1c.   DVT prophylaxis: SCDs  Code Status: Full  Family Communication: Discussed in detail with patient and her daughter at bedside.  Disposition Plan: DC home, possibly 6/5.  Consultants:   Cardiology   Procedures:   None   Antimicrobials:   None    Subjective: No recurrence of chest pain. Pruritic skin rashes/wheals. Requesting something for anxiety.  Objective:  Filed Vitals:   12/28/15 1055 12/28/15 1057 12/28/15 1349 12/28/15 1440  BP: 168/90 174/90 139/80 146/66  Pulse:   87 67  Temp:   98.4 F (36.9 C) 98 F (36.7 C)  TempSrc:   Oral Oral  Resp:    16  Height:      Weight:      SpO2:    96%    Intake/Output Summary (Last 24 hours) at 12/28/15 1515 Last data filed at 12/28/15 1300  Gross per 24 hour  Intake 1187.5 ml  Output   1300 ml  Net -112.5 ml   Filed Weights   12/27/15 1312 12/27/15 1830 12/28/15 0450  Weight: 94.802 kg (209 lb) 93.441 kg (206 lb) 93.6 kg (206 lb 5.6 oz)    Examination: Patient was examined with her female RN and patient's daughter in the room.  General exam: Pleasant elderly female sitting up comfortably in bed.  Respiratory system: Clear to auscultation. Respiratory effort normal. Cardiovascular system: S1 & S2 heard, RRR. No JVD, murmurs, rubs, gallops or clicks. No pedal edema.Telemetry: SB in the 50s-SR with PACs & PVCs.  Gastrointestinal system:  Abdomen is nondistended, soft and nontender. No organomegaly or masses felt. Normal bowel sounds heard. Central nervous system: Alert and oriented. No focal neurological deficits. Extremities: Symmetric 5 x 5 power. Skin: Multiple diffuse pruritic wheal/papular rash over trunk, axilla, breasts, upper medial thigh.  Psychiatry: Judgement and insight appear normal. Mood & affect  appropriate.     Data Reviewed: I have personally reviewed following labs and imaging studies  CBC:  Recent Labs Lab 12/27/15 1338  WBC 6.3  HGB 12.9  HCT 38.1  MCV 83.9  PLT [id number]   Basic Metabolic Panel:  Recent Labs Lab 12/27/15 1338  NA 137  K 3.5  CL 104  CO2 25  GLUCOSE 118*  BUN 12  CREATININE 0.65  CALCIUM 10.1   GFR: Estimated Creatinine Clearance: 70.5 mL/min (by C-G formula based on Cr of 0.65). Liver Function Tests: No results for input(s): AST, ALT, ALKPHOS, BILITOT, PROT, ALBUMIN in the last 168 hours. No results for input(s): LIPASE, AMYLASE in the last 168 hours. No results for input(s): AMMONIA in the last 168 hours. Coagulation Profile: No results for input(s): INR, PROTIME in the last 168 hours. Cardiac Enzymes:  Recent Labs Lab 12/28/15 0307 12/28/15 0704 12/28/15 1322  TROPONINI <0.03 <0.03 <0.03   BNP (last 3 results) No results for input(s): PROBNP in the last 8760 hours. HbA1C: No results for input(s): HGBA1C in the last 72 hours. CBG: No results for input(s): GLUCAP in the last 168 hours. Lipid Profile: No results for input(s): CHOL, HDL, LDLCALC, TRIG, CHOLHDL, LDLDIRECT in the last 72 hours. Thyroid Function Tests: No results for input(s): TSH, T4TOTAL, FREET4, T3FREE, THYROIDAB in the last 72 hours. Anemia Panel: No results for input(s): VITAMINB12, FOLATE, FERRITIN, TIBC, IRON, RETICCTPCT in the last 72 hours.  Sepsis Labs: No results for input(s): PROCALCITON, LATICACIDVEN in the last 168 hours.  No results found for this or any previous visit (from the past 240 hour(s)).       Radiology Studies: Dg Chest 2 View  12/27/2015  CLINICAL DATA:  Mid chest pain. Radiating into left shoulder and left arm. Shortness of breath for 2-3 days. EXAM: CHEST  2 VIEW COMPARISON:  12/18/2010 FINDINGS: Heart and mediastinal contours are within normal limits. No focal opacities or effusions. No acute bony abnormality. IMPRESSION: No  active cardiopulmonary disease. Electronically Signed   By: Rolm Baptise M.D.   On: 12/27/2015 14:25   Ct Angio Chest Pe W/cm &/or Wo Cm  12/27/2015  CLINICAL DATA:  Chest pain with nausea and dizziness EXAM: CT ANGIOGRAPHY CHEST WITH CONTRAST TECHNIQUE: Multidetector CT imaging of the chest was performed using the standard protocol during bolus administration of intravenous contrast. Multiplanar CT image reconstructions and MIPs were obtained to evaluate the vascular anatomy. CONTRAST:  80 mL Isovue 370 nonionic COMPARISON:  Chest CT January 27, 2006; chest radiograph December 27, 2015 FINDINGS: Mediastinum/Lymph Nodes: There is no demonstrable pulmonary embolus. There is no thoracic aortic aneurysm or dissection. Visualized great vessels appear unremarkable. There are scattered foci of atherosclerotic calcification in the aorta. There are scattered foci of coronary artery calcification. The pericardium is not appreciably thickened. Thyroid appears unremarkable. There are scattered small mediastinal lymph nodes. There is a lymph node in the sub- carinal region measuring 1.6 x 0.9 cm. There is a lymph node in the right hilum measuring 1.4 x 1.2 cm. There is a lymph node in the left hilum measuring 1.0 x 0.8 cm. These lymph nodes appear stable. Lungs/Pleura: There is mild atelectatic  change in the lung bases. There is also mild atelectasis in the inferior lingula. There is scarring in both lung apices with several scattered small bullae in the lung apices. There is no edema or consolidation. Upper abdomen: In the visualized upper abdomen, there is evidence of reflux of contrast into the inferior vena cava and hepatic veins. Visualized upper abdominal structures otherwise appear unremarkable. Musculoskeletal: There is degenerative change in the thoracic spine. There are no blastic or lytic bone lesions. Review of the MIP images confirms the above findings. IMPRESSION: No demonstrable pulmonary embolus. Scattered foci of  coronary artery calcification noted. Several borderline prominent lymph nodes are stable compared to prior study from 2007. Areas of mild lung scarring and atelectasis present. No parenchymal lung edema or consolidation. Reflux of contrast into the inferior vena cava and hepatic veins may indicate elevated right heart pressure. Electronically Signed   By: Lowella Grip III M.D.   On: 12/27/2015 17:49        Scheduled Meds: . aspirin EC  325 mg Oral Daily  . atenolol  25 mg Oral Daily  . famotidine (PEPCID) IV  20 mg Intravenous Q12H  . hydrochlorothiazide  25 mg Oral Daily  . lisinopril  40 mg Oral Daily   Continuous Infusions:        Time spent: 30 minutes.    [facility], MD [facility] Pager [phone]  If 7PM-7AM, please contact night-coverage [email or web address] Password [email or web address] 12/28/2015, 3:15 PM

## 2015-12-28 NOTE — Progress Notes (Signed)
  Stress test results reviewed Dr. Sallyanne Kuster. He did not see evidence of ischemia noted. Possible diaphragmatic attenuation.  Good for discharge from a Cardiology perspective. Attending provider notified.    Signed, Erma Heritage, PA-C 12/28/2015, 4:51 PM Pager: [phone]

## 2015-12-28 NOTE — Progress Notes (Signed)
  Seen in Nuclear Medicine for 1-day NST. Official results pending by [facility] Radiology following stress images.  Signed, Erma Heritage, PA-C 12/28/2015, 11:00 AM Pager: [phone]

## 2015-12-29 ENCOUNTER — Observation Stay (HOSPITAL_BASED_OUTPATIENT_CLINIC_OR_DEPARTMENT_OTHER): Payer: Medicare Other

## 2015-12-29 ENCOUNTER — Encounter (HOSPITAL_COMMUNITY): Payer: Self-pay | Admitting: *Deleted

## 2015-12-29 DIAGNOSIS — R079 Chest pain, unspecified: Secondary | ICD-10-CM

## 2015-12-29 DIAGNOSIS — I340 Nonrheumatic mitral (valve) insufficiency: Secondary | ICD-10-CM | POA: Diagnosis not present

## 2015-12-29 DIAGNOSIS — E785 Hyperlipidemia, unspecified: Secondary | ICD-10-CM | POA: Diagnosis not present

## 2015-12-29 DIAGNOSIS — T783XXD Angioneurotic edema, subsequent encounter: Secondary | ICD-10-CM

## 2015-12-29 DIAGNOSIS — I471 Supraventricular tachycardia, unspecified: Secondary | ICD-10-CM | POA: Insufficient documentation

## 2015-12-29 DIAGNOSIS — R001 Bradycardia, unspecified: Secondary | ICD-10-CM | POA: Diagnosis not present

## 2015-12-29 DIAGNOSIS — T783XXA Angioneurotic edema, initial encounter: Secondary | ICD-10-CM | POA: Insufficient documentation

## 2015-12-29 DIAGNOSIS — I10 Essential (primary) hypertension: Secondary | ICD-10-CM | POA: Diagnosis not present

## 2015-12-29 LAB — ECHOCARDIOGRAM COMPLETE
CHL CUP STROKE VOLUME: 55 mL
E decel time: 243 msec
EERAT: 12.4
FS: 38 % (ref 28–44)
HEIGHTINCHES: 64 in
IV/PV OW: 1.12
LA ID, A-P, ES: 40 cm
LA diam index: 1.91 cm/m2
LA vol A4C: 92.9 ml
LA vol: 85.3 cm3
LAVOLIN: 40.7 mL/m2
LEFT ATRIUM END SYS DIAM: 40 cm
LV E/e' medial: 12.4
LV E/e'average: 12.4
LV SIMPSON'S DISK: 68
LV dias vol index: 39 mL/m2
LV dias vol: 82 mL (ref 46–106)
LV e' LATERAL: 6.96 cm/s
LV sys vol index: 13 mL/m2
LV sys vol: 26 mL (ref 14–42)
LVOT SV: 75 cm3
LVOT VTI: 24 cm
LVOT area: 3.14 cm2
LVOTD: 20 cm
LVOTPV: 91.6 m/s
MV Dec: 243
MV pk A vel: 90.2 m/s
MV pk E vel: 86.3 m/s
MVPG: 3 mmHg
P 1/2 time: 641 ms
PW: 12.5 mm — AB (ref 0.6–1.1)
TAPSE: 26.4 cm
TDI e' lateral: 6.96
TDI e' medial: 3.81
Weight: 3292.8 oz

## 2015-12-29 LAB — LIPID PANEL
CHOL/HDL RATIO: 4.8 ratio
CHOLESTEROL: 191 mg/dL (ref 0–200)
HDL: 40 mg/dL — ABNORMAL LOW (ref >40)
LDL CALC: 134 mg/dL — AB (ref 0–99)
TRIGLYCERIDES: 84 mg/dL (ref <150)
VLDL: 17 mg/dL (ref 0–40)

## 2015-12-29 LAB — TSH: TSH: 0.577 u[IU]/mL (ref 0.350–4.500)

## 2015-12-29 MED ORDER — PREDNISONE 20 MG PO TABS
40.0000 mg | ORAL_TABLET | Freq: Every day | ORAL | Status: DC
  Administered 2015-12-29 – 2015-12-30 (×2): 40 mg via ORAL
  Filled 2015-12-29: qty 2

## 2015-12-29 MED ORDER — TRAMADOL HCL 50 MG PO TABS: 50.0000 mg | ORAL_TABLET | Freq: Four times a day (QID) | ORAL | Status: DC | PRN

## 2015-12-29 MED ORDER — TRAMADOL HCL 50 MG PO TABS
50.0000 mg | ORAL_TABLET | Freq: Four times a day (QID) | ORAL | Status: DC | PRN
  Administered 2015-12-29 (×2): 50 mg via ORAL
  Filled 2015-12-29 (×2): qty 1

## 2015-12-29 MED ORDER — LISINOPRIL 40 MG PO TABS: 40.0000 mg | ORAL_TABLET | Freq: Every day | ORAL | Status: DC

## 2015-12-29 MED ORDER — POLYETHYLENE GLYCOL 3350 17 G PO PACK
17.0000 g | PACK | Freq: Every day | ORAL | Status: DC | PRN
  Administered 2015-12-30: 17 g via ORAL
  Filled 2015-12-29: qty 1

## 2015-12-29 MED ORDER — AMLODIPINE BESYLATE 5 MG PO TABS
5.0000 mg | ORAL_TABLET | Freq: Every day | ORAL | Status: DC
  Administered 2015-12-29: 5 mg via ORAL
  Filled 2015-12-29: qty 1

## 2015-12-29 MED ORDER — PREDNISONE 20 MG PO TABS
40.0000 mg | ORAL_TABLET | Freq: Every day | ORAL | Status: DC
  Filled 2015-12-29: qty 2

## 2015-12-29 NOTE — Progress Notes (Signed)
PROGRESS NOTE  Heather Shaw  [id number] DOB: 24-Oct-1940  DOA: 12/27/2015 PCP: Arnette Norris, MD   Brief Narrative:  75 year old female patient with PMH of HLD, HTN, chronic intermittent vertigo, interstitial cystitis, presented to [facility] ED on 12/27/15 with complaints of progressive DOE over a couple of months, 2 days history of intermittent left-sided chest pressure, woke up on night of admission with worsening chest pain with radiation to left upper extremity and dyspnea. Pain worsened by deep inspiration. EMS activated. Some relief with sublingual NTG and hypertensive with PMS 190/120. Cardiology consulted.   Assessment & Plan:   Principal Problem:   Chest pain Active Problems:   HLD (hyperlipidemia)   Essential hypertension   Bradycardia   Chest pain - Mostly atypical and some typical features. CAD risk factors. EKG without acute findings. Troponin 3: Negative. CTA chest: No PE. Scattered foci of coronary artery calcification. No edema or consolidation. - Cardiology was consulted and underwent nuclear stress test which were interpreted by cardiology as negative. As per cardiology, chest pain syndrome is atypical and more positional than exertional.  -No recurrence of chest pain. Workup complete.  Skin rash/Urtricaria/angioedema - Patient stated that she developed pruritic skin rash after she received something in the IV in ED last night (cannot say if it was morphine or contrast for CT). Denies exposure to any new medications, diet or toiletry. - Unclear etiology. ? Contrast ? New meds. Occurred after coming to ED and before stress test. Discontinued all newly started medications (morphine, Lovenox, GI cocktail and subcutaneous heparin). Trial of oral Benadryl, IV Pepcid and monitor closely. Patient hesitant to try steroids because she states that she has had very high blood pressures and had to come to the ED twice after taking steroids. Sees dermatologist for? Seborrheic dermatitis.  Monitor  - Overnight 6/4, developed lips and facial swelling with some "throat pain". Received IV Solu-Medrol 60 mg 1 dose. Improved hives, lips and facial swelling but swelling not. Back to baseline. No throat swelling or noisy breathing reported. - Etiology of her allergic reaction not entirely clear. Patient has been on lisinopril for approximately 2 years. Although felt less likely, allergic reaction can be seen anytime during the course of this medication. Thereby discontinued lisinopril for now as discussed with cardiology and started amlodipine which may have to be titrated up. -  started oral prednisone 40 mg daily and quick outpatient taper. Patient agrees.  - Monitor overnight to ensure stability prior to discharge home in a.m.   Essential hypertension - Elevated blood pressure in the ED was probably precipitated by pain. Better controlled now. Continue atenolol, and HCTZ (all prior home medications and not new). Lisinopril discontinued due to reasons indicated above. Started amlodipine 5 MG daily and titrate up as needed.   Hyperlipidemia - LDL: 134. Consider starting statins during outpatient follow-up (did not start in the hospital due to current allergic reaction of unknown cause and did not want to complicate matters). Follow A1c.    SVT - Noted on 12/28/15 from 11 AM-12:18 PM. Symptomatic. Cardiology follow-up appreciated. Attributed to allergic reaction. Unable to up titrate at all as her baseline heart rate sometimes bradycardia is down to the 40s. Monitor for now. Check TSH.   DVT prophylaxis: SCDs  Code Status: Full  Family Communication: Discussed with patient .  Disposition Plan: DC home, possibly 6/6.  Consultants:   Cardiology   Procedures:   2-D echo 12/29/15: Result status: Final result     Left ventricle: Normal cavity  size. Concentric hypertrophy of moderate severity. LV systolic function is normal with an EF of 60-65%. Grade I (mild, abnormal relaxation) left  ventricular diastolic dysfunction. Elevated left atrial pressure. There are no obvious wall motion abnormalities.  Left atrium: Cavity is moderately dilated.  Mitral valve: Trace regurgitation.  Right ventricle: Normal cavity size and ejection fraction.  Tricuspid valve: Trace regurgitation.  Aortic valve: No stenosis. Mild regurgitation.     Antimicrobials:   None    Subjective: No recurrence of chest pain. Skin rash and hives are better. Overnight events noted-lips and facial swelling. Improved but not to baseline.   Objective:  Filed Vitals:   12/29/15 0312 12/29/15 0434 12/29/15 1134 12/29/15 1138  BP:  161/84 143/63 143/63  Pulse:  63    Temp:  97.5 F (36.4 C)    TempSrc:  Axillary    Resp:  12    Height:  5\' 4"  (1.626 m)    Weight: 93.35 kg (205 lb 12.8 oz) 92.987 kg (205 lb)    SpO2:  96% 99%     Intake/Output Summary (Last 24 hours) at 12/29/15 1223 Last data filed at 12/29/15 0300  Gross per 24 hour  Intake    770 ml  Output    350 ml  Net    420 ml   Filed Weights   12/28/15 0450 12/29/15 0312 12/29/15 0434  Weight: 93.6 kg (206 lb 5.6 oz) 93.35 kg (205 lb 12.8 oz) 92.987 kg (205 lb)    Examination: Patient was examined with a female nursing aide in room General exam: Pleasant elderly female sitting up comfortably in bed.  Respiratory system: Clear to auscultation. Respiratory effort normal. Cardiovascular system: S1 & S2 heard, RRR. No JVD, murmurs, rubs, gallops or clicks. No pedal edema.Telemetry: SB in the 50s-SR.  transient SVT yesterday.  Gastrointestinal system: Abdomen is nondistended, soft and nontender. No organomegaly or masses felt. Normal bowel sounds heard. Central nervous system: Alert and oriented. No focal neurological deficits. Extremities: Symmetric 5 x 5 power. Skin: Multiple diffuse pruritic wheal/papular rash over trunk, axilla, breasts, upper medial thigh.  Psychiatry: Judgement and insight appear normal. Mood & affect  appropriate.     Data Reviewed: I have personally reviewed following labs and imaging studies  CBC:  Recent Labs Lab 12/27/15 1338  WBC 6.3  HGB 12.9  HCT 38.1  MCV 83.9  PLT Q000111Q   Basic Metabolic Panel:  Recent Labs Lab 12/27/15 1338  NA 137  K 3.5  CL 104  CO2 25  GLUCOSE 118*  BUN 12  CREATININE 0.65  CALCIUM 10.1   GFR: Estimated Creatinine Clearance: 70.2 mL/min (by C-G formula based on Cr of 0.65). Liver Function Tests: No results for input(s): AST, ALT, ALKPHOS, BILITOT, PROT, ALBUMIN in the last 168 hours. No results for input(s): LIPASE, AMYLASE in the last 168 hours. No results for input(s): AMMONIA in the last 168 hours. Coagulation Profile: No results for input(s): INR, PROTIME in the last 168 hours. Cardiac Enzymes:  Recent Labs Lab 12/28/15 0307 12/28/15 0704 12/28/15 1322  TROPONINI <0.03 <0.03 <0.03   BNP (last 3 results) No results for input(s): PROBNP in the last 8760 hours. HbA1C: No results for input(s): HGBA1C in the last 72 hours. CBG: No results for input(s): GLUCAP in the last 168 hours. Lipid Profile:  Recent Labs  12/29/15 0502  CHOL 191  HDL 40*  LDLCALC 134*  TRIG 84  CHOLHDL 4.8   Thyroid Function Tests: No results for input(s):  TSH, T4TOTAL, FREET4, T3FREE, THYROIDAB in the last 72 hours. Anemia Panel: No results for input(s): VITAMINB12, FOLATE, FERRITIN, TIBC, IRON, RETICCTPCT in the last 72 hours.  Sepsis Labs: No results for input(s): PROCALCITON, LATICACIDVEN in the last 168 hours.  No results found for this or any previous visit (from the past 240 hour(s)).       Radiology Studies: Dg Chest 2 View  12/27/2015  CLINICAL DATA:  Mid chest pain. Radiating into left shoulder and left arm. Shortness of breath for 2-3 days. EXAM: CHEST  2 VIEW COMPARISON:  12/18/2010 FINDINGS: Heart and mediastinal contours are within normal limits. No focal opacities or effusions. No acute bony abnormality. IMPRESSION: No  active cardiopulmonary disease. Electronically Signed   By: Rolm Baptise M.D.   On: 12/27/2015 14:25   Ct Angio Chest Pe W/cm &/or Wo Cm  12/27/2015  CLINICAL DATA:  Chest pain with nausea and dizziness EXAM: CT ANGIOGRAPHY CHEST WITH CONTRAST TECHNIQUE: Multidetector CT imaging of the chest was performed using the standard protocol during bolus administration of intravenous contrast. Multiplanar CT image reconstructions and MIPs were obtained to evaluate the vascular anatomy. CONTRAST:  80 mL Isovue 370 nonionic COMPARISON:  Chest CT January 27, 2006; chest radiograph December 27, 2015 FINDINGS: Mediastinum/Lymph Nodes: There is no demonstrable pulmonary embolus. There is no thoracic aortic aneurysm or dissection. Visualized great vessels appear unremarkable. There are scattered foci of atherosclerotic calcification in the aorta. There are scattered foci of coronary artery calcification. The pericardium is not appreciably thickened. Thyroid appears unremarkable. There are scattered small mediastinal lymph nodes. There is a lymph node in the sub- carinal region measuring 1.6 x 0.9 cm. There is a lymph node in the right hilum measuring 1.4 x 1.2 cm. There is a lymph node in the left hilum measuring 1.0 x 0.8 cm. These lymph nodes appear stable. Lungs/Pleura: There is mild atelectatic change in the lung bases. There is also mild atelectasis in the inferior lingula. There is scarring in both lung apices with several scattered small bullae in the lung apices. There is no edema or consolidation. Upper abdomen: In the visualized upper abdomen, there is evidence of reflux of contrast into the inferior vena cava and hepatic veins. Visualized upper abdominal structures otherwise appear unremarkable. Musculoskeletal: There is degenerative change in the thoracic spine. There are no blastic or lytic bone lesions. Review of the MIP images confirms the above findings. IMPRESSION: No demonstrable pulmonary embolus. Scattered foci of  coronary artery calcification noted. Several borderline prominent lymph nodes are stable compared to prior study from 2007. Areas of mild lung scarring and atelectasis present. No parenchymal lung edema or consolidation. Reflux of contrast into the inferior vena cava and hepatic veins may indicate elevated right heart pressure. Electronically Signed   By: Lowella Grip III M.D.   On: 12/27/2015 17:49   Nm Myocar Multi W/spect W/wall Motion / Ef  12/28/2015  CLINICAL DATA:  Chest pain and diaphoresis. Hypertension, hyperlipidemia, and vertigo. EXAM: MYOCARDIAL IMAGING WITH SPECT (REST AND PHARMACOLOGIC-STRESS) GATED LEFT VENTRICULAR WALL MOTION STUDY LEFT VENTRICULAR EJECTION FRACTION TECHNIQUE: Standard myocardial SPECT imaging was performed after resting intravenous injection of 10 mCi Tc-7m tetrofosmin. Subsequently, intravenous infusion of Lexiscan was performed under the supervision of the Cardiology staff. At peak effect of the drug, 30 mCi Tc-10m tetrofosmin was injected intravenously and standard myocardial SPECT imaging was performed. Quantitative gated imaging was also performed to evaluate left ventricular wall motion, and estimate left ventricular ejection fraction. COMPARISON:  None.  FINDINGS: Perfusion: Moderate area of decreased myocardial activity seen in the lateral and inferior walls of the left ventricle on stress images, which shows reversibility on resting images, suspicious for myocardial ischemia. (Summed difference score of 12). Wall Motion: Septal hypokinesis noted. No evidence of left ventricular dilatation. Left Ventricular Ejection Fraction: 61 % End diastolic volume 47 ml End systolic volume 19 ml IMPRESSION: 1. Moderate reversible myocardial perfusion defect in the lateral and inferior left ventricular walls, suspicious for myocardial ischemia. 2. Septal hypokinesis noted.  No left ventricular dilatation. 3. Left ventricular ejection fraction 61% 4. Non invasive risk  stratification*: Intermediate *2012 Appropriate Use Criteria for Coronary Revascularization Focused Update: J Am Coll Cardiol. [id number]. http://content.[email or web address]?[email or web address] Electronically Signed   By: Earle Gell M.D.   On: 12/28/2015 15:32        Scheduled Meds: . amLODipine  5 mg Oral Daily  . aspirin EC  325 mg Oral Daily  . atenolol  25 mg Oral Daily  . famotidine (PEPCID) IV  20 mg Intravenous Q12H  . fluocinonide-emollient  1 application Topical BID  . hydrochlorothiazide  25 mg Oral Daily  . LORazepam  0.5 mg Oral Once  . predniSONE  40 mg Oral Q breakfast   Continuous Infusions:        Time spent: 30 minutes.    [facility], MD [facility] Pager [phone]  If 7PM-7AM, please contact night-coverage [email or web address] Password [facility] 12/29/2015, 12:23 PM

## 2015-12-29 NOTE — Care Management Note (Signed)
Case Management Note  Patient Details  Name: Heather Shaw MRN: [id number] Date of Birth: 04-19-1941  Subjective/Objective:  Pt in for Chest Pain- post stress test. Plan for d/c home once stable. Pt is from home alone, however has support of son.                   Action/Plan: Per pt no need for [facility] Services at this time. No further needs from CM at this time.    Expected Discharge Date:                  Expected Discharge Plan:  Home/Self Care  In-House Referral:  NA  Discharge planning Services  CM Consult  Post Acute Care Choice:  NA Choice offered to:  NA  DME Arranged:  N/A DME Agency:  NA  HH Arranged:  NA HH Agency:  NA  Status of Service:  Completed, signed off  Medicare Important Message Given:    Date Medicare IM Given:    Medicare IM give by:    Date Additional Medicare IM Given:    Additional Medicare Important Message give by:     If discussed at [place] of Stay Meetings, dates discussed:    Additional Comments:  [name], RN 12/29/2015, 11:46 AM

## 2015-12-29 NOTE — Care Management Obs Status (Signed)
[place] NOTIFICATION   Patient Details  Name: Heather Shaw MRN: [id number] Date of Birth: 1940/10/09   Medicare Observation Status Notification Given:  Yes    [name], RN 12/29/2015, 11:45 AM

## 2015-12-29 NOTE — Progress Notes (Addendum)
Patient Name: Heather Shaw Date of Encounter: 12/29/2015  Primary Cardiologist: Dr. Johnsie Cancel   Patient profile: 75 yo female with PMH of HTN/HLD/DDD/Vertigo and interstitial cystitis who presented to the [facility] ED on 12/27/2015 with reports of chest pain with diaphoresis and radiation into the left arm. Myoview negative  Principal Problem:   Chest pain Active Problems:   HLD (hyperlipidemia)   Essential hypertension   Bradycardia    SUBJECTIVE  Denies any CP or SOB. Was aware of her tachycardia yesterday. She had it before, infrequently, attributed to anxiety. She says it occurred yesterday after myoview, her lips were swollen.   CURRENT MEDS . amLODipine  5 mg Oral Daily  . aspirin EC  325 mg Oral Daily  . atenolol  25 mg Oral Daily  . famotidine (PEPCID) IV  20 mg Intravenous Q12H  . fluocinonide-emollient  1 application Topical BID  . hydrochlorothiazide  25 mg Oral Daily  . LORazepam  0.5 mg Oral Once  . predniSONE  40 mg Oral Q breakfast    OBJECTIVE  Filed Vitals:   12/28/15 1440 12/28/15 1947 12/29/15 0312 12/29/15 0434  BP: 146/66 127/51  161/84  Pulse: 67 64  63  Temp: 98 F (36.7 C) 98.6 F (37 C)  97.5 F (36.4 C)  TempSrc: Oral Oral  Axillary  Resp: 16 16  12   Height:      Weight:   205 lb 12.8 oz (93.35 kg)   SpO2: 96% 99%  96%    Intake/Output Summary (Last 24 hours) at 12/29/15 1036 Last data filed at 12/29/15 0300  Gross per 24 hour  Intake    770 ml  Output    350 ml  Net    420 ml   Filed Weights   12/27/15 1830 12/28/15 0450 12/29/15 0312  Weight: 206 lb (93.441 kg) 206 lb 5.6 oz (93.6 kg) 205 lb 12.8 oz (93.35 kg)    PHYSICAL EXAM  General: Pleasant, NAD. Neuro: Alert and oriented X 3. Moves all extremities spontaneously. Psych: Normal affect. HEENT:  Normal  Neck: Supple without bruits or JVD. Lungs:  Resp regular and unlabored, CTA. Heart: RRR no s3, s4, or murmurs. Abdomen: Soft, non-tender, non-distended, BS + x 4.    Extremities: No clubbing, cyanosis or edema. DP/PT/Radials 2+ and equal bilaterally.  Accessory Clinical Findings  CBC  Recent Labs  12/27/15 1338  WBC 6.3  HGB 12.9  HCT 38.1  MCV 83.9  PLT Q000111Q   Basic Metabolic Panel  Recent Labs  12/27/15 1338  NA 137  K 3.5  CL 104  CO2 25  GLUCOSE 118*  BUN 12  CREATININE 0.65  CALCIUM 10.1   Cardiac Enzymes  Recent Labs  12/28/15 0307 12/28/15 0704 12/28/15 1322  TROPONINI <0.03 <0.03 <0.03   D-Dimer  Recent Labs  12/27/15 1444  DDIMER 2.36*   Fasting Lipid Panel  Recent Labs  12/29/15 0502  CHOL 191  HDL 40*  LDLCALC 134*  TRIG 84  CHOLHDL 4.8    TELE NSR with HR 40-50s, episode of prolonged sinus tach yesterday    ECG  No new EKG  Echocardiogram  pending    Radiology/Studies  Dg Chest 2 View  12/27/2015  CLINICAL DATA:  Mid chest pain. Radiating into left shoulder and left arm. Shortness of breath for 2-3 days. EXAM: CHEST  2 VIEW COMPARISON:  12/18/2010 FINDINGS: Heart and mediastinal contours are within normal limits. No focal opacities or effusions. No acute bony  abnormality. IMPRESSION: No active cardiopulmonary disease. Electronically Signed   By: Rolm Baptise M.D.   On: 12/27/2015 14:25   Ct Angio Chest Pe W/cm &/or Wo Cm  12/27/2015  CLINICAL DATA:  Chest pain with nausea and dizziness EXAM: CT ANGIOGRAPHY CHEST WITH CONTRAST TECHNIQUE: Multidetector CT imaging of the chest was performed using the standard protocol during bolus administration of intravenous contrast. Multiplanar CT image reconstructions and MIPs were obtained to evaluate the vascular anatomy. CONTRAST:  80 mL Isovue 370 nonionic COMPARISON:  Chest CT January 27, 2006; chest radiograph December 27, 2015 FINDINGS: Mediastinum/Lymph Nodes: There is no demonstrable pulmonary embolus. There is no thoracic aortic aneurysm or dissection. Visualized great vessels appear unremarkable. There are scattered foci of atherosclerotic calcification in  the aorta. There are scattered foci of coronary artery calcification. The pericardium is not appreciably thickened. Thyroid appears unremarkable. There are scattered small mediastinal lymph nodes. There is a lymph node in the sub- carinal region measuring 1.6 x 0.9 cm. There is a lymph node in the right hilum measuring 1.4 x 1.2 cm. There is a lymph node in the left hilum measuring 1.0 x 0.8 cm. These lymph nodes appear stable. Lungs/Pleura: There is mild atelectatic change in the lung bases. There is also mild atelectasis in the inferior lingula. There is scarring in both lung apices with several scattered small bullae in the lung apices. There is no edema or consolidation. Upper abdomen: In the visualized upper abdomen, there is evidence of reflux of contrast into the inferior vena cava and hepatic veins. Visualized upper abdominal structures otherwise appear unremarkable. Musculoskeletal: There is degenerative change in the thoracic spine. There are no blastic or lytic bone lesions. Review of the MIP images confirms the above findings. IMPRESSION: No demonstrable pulmonary embolus. Scattered foci of coronary artery calcification noted. Several borderline prominent lymph nodes are stable compared to prior study from 2007. Areas of mild lung scarring and atelectasis present. No parenchymal lung edema or consolidation. Reflux of contrast into the inferior vena cava and hepatic veins may indicate elevated right heart pressure. Electronically Signed   By: Lowella Grip III M.D.   On: 12/27/2015 17:49   Nm Myocar Multi W/spect W/wall Motion / Ef  12/28/2015  CLINICAL DATA:  Chest pain and diaphoresis. Hypertension, hyperlipidemia, and vertigo. EXAM: MYOCARDIAL IMAGING WITH SPECT (REST AND PHARMACOLOGIC-STRESS) GATED LEFT VENTRICULAR WALL MOTION STUDY LEFT VENTRICULAR EJECTION FRACTION TECHNIQUE: Standard myocardial SPECT imaging was performed after resting intravenous injection of 10 mCi Tc-53m tetrofosmin.  Subsequently, intravenous infusion of Lexiscan was performed under the supervision of the Cardiology staff. At peak effect of the drug, 30 mCi Tc-23m tetrofosmin was injected intravenously and standard myocardial SPECT imaging was performed. Quantitative gated imaging was also performed to evaluate left ventricular wall motion, and estimate left ventricular ejection fraction. COMPARISON:  None. FINDINGS: Perfusion: Moderate area of decreased myocardial activity seen in the lateral and inferior walls of the left ventricle on stress images, which shows reversibility on resting images, suspicious for myocardial ischemia. (Summed difference score of 12). Wall Motion: Septal hypokinesis noted. No evidence of left ventricular dilatation. Left Ventricular Ejection Fraction: 61 % End diastolic volume 47 ml End systolic volume 19 ml IMPRESSION: 1. Moderate reversible myocardial perfusion defect in the lateral and inferior left ventricular walls, suspicious for myocardial ischemia. 2. Septal hypokinesis noted.  No left ventricular dilatation. 3. Left ventricular ejection fraction 61% 4. Non invasive risk stratification*: Intermediate *2012 Appropriate Use Criteria for Coronary Revascularization Focused Update: J Am  Coll Cardiol. [id number]. http://content.[email or web address].aspx?[email or web address] Electronically Signed   By: Earle Gell M.D.   On: 12/28/2015 15:32    ASSESSMENT AND PLAN  1. Chest pain  - CTA negative PE, scattered foci of coronary artery calcification.   - official report by radiologist interpreted as normal EF, moderate reversible ischemia in lateral and inferior LV. Image reviewed by Dr. Sallyanne Kuster felt no ischemia, but possible diaphragmatic attenuation instead.   - chest pain resolved. Pending echo today  2. HTN: 40mg  lisinopril discontinued, changed to amlodipine 5mg  daily by IM, may need to uptitrate to 10 mg given previous high dose of lisinopril  3. HLD: her LDL is 130, given  scattered coronary artery calcification noted on CTA, consider add 20mg  lipitor.  4. Baseline Bradycardia  5. Sinus tach with SVT  - in the setting of possible allergic reaction. 1 prolonged episode from 11:00AM until 12:18PM on 12/28/2015. Unable to uptitrate atenolol as her baseline HR sometimes brady down to high 40s. I am leaning more toward monitor for recurrence for now. If this is truly relate to allergic rxn, would not expect recurrence as her allergic symptom has resolved  6. Elevated d-dimer with negative CTA for PE  7. Lip swelling and hives: unclear cause, maybe related to medication she received during lexiscan. She has been on lisinopril for at least 2 years, not sure if this is related to lisinopril. However lisinopril has been changed to amlodipine.    Signed, Almyra Deforest PA-C Pager: [phone]   I have examined the patient and reviewed assessment and plan and discussed with patient.  Agree with above as stated.  She now thinks her allergic reaction started after the CT scan.  Given urticaria that she described, seems less likely to be ACE-I.  Allergy started before the stress test.  I suspect it was the CT scan dye that made her allergic.  WOuld check with pharmacy regarding the likelhood of hives over a year after starting lisinopril.  If unlikely, would consider switching back to lisinopril for all of the secondary CV benefits.   For now continue atenolol at 25 mg daily.  SVT may have been from the stress of the allergy.    Larae Grooms

## 2015-12-29 NOTE — Progress Notes (Signed)
  Echocardiogram 2D Echocardiogram has been performed.  Bobbye Charleston 12/29/2015, 11:08 AM

## 2015-12-30 DIAGNOSIS — E785 Hyperlipidemia, unspecified: Secondary | ICD-10-CM | POA: Diagnosis not present

## 2015-12-30 DIAGNOSIS — T783XXD Angioneurotic edema, subsequent encounter: Secondary | ICD-10-CM | POA: Diagnosis not present

## 2015-12-30 DIAGNOSIS — I10 Essential (primary) hypertension: Secondary | ICD-10-CM | POA: Diagnosis not present

## 2015-12-30 DIAGNOSIS — I471 Supraventricular tachycardia: Secondary | ICD-10-CM | POA: Diagnosis not present

## 2015-12-30 DIAGNOSIS — R079 Chest pain, unspecified: Secondary | ICD-10-CM | POA: Diagnosis not present

## 2015-12-30 LAB — HEMOGLOBIN A1C
Hgb A1c MFr Bld: 5.9 % — ABNORMAL HIGH (ref 4.8–5.6)
Mean Plasma Glucose: 123 mg/dL

## 2015-12-30 MED ORDER — FAMOTIDINE 20 MG PO TABS: 20.0000 mg | ORAL_TABLET | Freq: Two times a day (BID) | ORAL | Status: DC

## 2015-12-30 MED ORDER — PREDNISONE 10 MG PO TABS: ORAL_TABLET | ORAL | Status: DC

## 2015-12-30 MED ORDER — DIPHENHYDRAMINE HCL 12.5 MG/5ML PO ELIX: 25.0000 mg | ORAL_SOLUTION | Freq: Four times a day (QID) | ORAL | Status: DC | PRN

## 2015-12-30 MED ORDER — AMLODIPINE BESYLATE 10 MG PO TABS: 10.0000 mg | ORAL_TABLET | Freq: Every day | ORAL | Status: DC

## 2015-12-30 MED ORDER — AMLODIPINE BESYLATE 10 MG PO TABS
10.0000 mg | ORAL_TABLET | Freq: Every day | ORAL | Status: DC
  Administered 2015-12-30: 10 mg via ORAL
  Filled 2015-12-30: qty 1

## 2015-12-30 NOTE — Discharge Instructions (Signed)

## 2015-12-30 NOTE — Discharge Summary (Signed)
Physician Discharge Summary  Heather Shaw  [id number]  DOB: 1940/12/06  DOA: 12/27/2015  PCP: Arnette Norris, MD  Admit date: 12/27/2015 Discharge date: 12/30/2015  Time spent: Greater than 30 minutes  Recommendations for Outpatient Follow-up:  1. Dr. Arnette Norris, PCP in 3 days. 2. Truitt Merle, Cardiology NP on 01/20/16 at 1:30 PM. 3. Lisinopril and IV contrast have been listed as new allergies.  Discharge Diagnoses:  Principal Problem:   Chest pain Active Problems:   HLD (hyperlipidemia)   Essential hypertension   Bradycardia   SVT (supraventricular tachycardia) (HCC)   Angio-edema   Discharge Condition: Improved & Stable  Diet recommendation: Heart healthy diet.  Filed Weights   12/29/15 0312 12/29/15 0434 12/30/15 0500  Weight: 93.35 kg (205 lb 12.8 oz) 92.987 kg (205 lb) 92.307 kg (203 lb 8 oz)    History of present illness:  75 year old female patient with PMH of HLD, HTN, chronic intermittent vertigo, interstitial cystitis, presented to [facility] ED on 12/27/15 with complaints of progressive DOE over a couple of months, 2 days history of intermittent left-sided chest pressure, woke up on night of admission with worsening chest pain with radiation to left upper extremity and dyspnea. Pain worsened by deep inspiration. EMS activated. Some relief with sublingual NTG and hypertensive with EMS 190/120. Cardiology consulted.  Hospital Course:   Chest pain - Mostly atypical and some typical features. CAD risk factors. EKG without acute findings. Troponin 3: Negative. CTA chest: No PE. Scattered foci of coronary artery calcification. No edema or consolidation. - Cardiology was consulted and underwent nuclear stress test which were interpreted by radiology as intermediate risk but cardiology reviewed images and felt no ischemia, but possible diaphragmatic attenuation instead. As per cardiology, chest pain syndrome is atypical and more positional than exertional.  -No recurrence of  chest pain. Workup complete. - 2-D echo: LVEF 60-65% and grade 1 diastolic dysfunction without regional wall motion abnormalities. - Discussed with Dr. Irish Lack who has seen her and cleared her for discharge home and no need for aspirin at discharge.  Skin rash/Urtricaria/angioedema - Patient stated that she developed pruritic skin rash after she received something in the IV in ED (not sure if it was morphine or contrast for CT). Denies exposure to any new medications, diet or toiletry. - Unclear etiology. ? Contrast ? New meds. Occurred after coming to ED and before stress test. Discontinued all newly started medications (morphine, Lovenox, GI cocktail and subcutaneous heparin). Trial of oral Benadryl, IV Pepcid and monitor closely. Patient hesitant to try steroids because she states that she has had very high blood pressures and had to come to the ED twice after taking steroids. Sees dermatologist for? Seborrheic dermatitis. Monitor  - Overnight 6/4, developed lips and facial swelling with some "throat pain". Received IV Solu-Medrol 60 mg 1 dose. Improved hives, lips and facial swelling but swelling was not back to baseline yesterday. No throat swelling or noisy breathing reported. - Etiology of her allergic reaction not entirely clear. Patient has been on lisinopril for approximately 2 years. Although felt less likely, allergic reaction can be seen anytime during the course of this medication. Thereby discontinued lisinopril for now as discussed with cardiology and started amlodipine. - Started oral prednisone taper, continued Pepcid and Benadryl when necessary. Lips and facial swelling resolved. No airway issues. Hives have mostly resolved but had 37-year-old left upper arm. Has some bruising at site of prior hives. Clinically improved. Discharge on Benadryl when necessary, Pepcid and prednisone taper. Advised to  follow-up with her PCP before the end of this week and seek immediate medical attention if  there is any worsening symptoms. She verbalized understanding. For now, etiology of her allergic reaction is undetermined but could have been due to IV contrast for CT and may need pretreatment if CT needed in the future or due to lisinopril-although felt less likely, unable to be definitely sure and hence discontinued.  Essential hypertension - Continue home dose of atenolol and HCTZ. Lisinopril discontinued due to reasons stated above. Amlodipine was started and titrated up. Close outpatient follow-up.  Hyperlipidemia - LDL: 134. Consider starting statins during outpatient follow-up (did not start in the hospital due to current allergic reaction of unknown cause and did not want to complicate matters). Follow A1c: 5.9.   SVT - Noted on 12/28/15 from 11 AM-12:18 PM. Symptomatic. Cardiology follow-up appreciated. Attributed to allergic reaction. Unable to up titrate atenolol as her baseline heart rate sometimes bradycardia is down to the 40s. Monitor for now. Check TSH: 0.577.    Consultants:   Cardiology  Procedures:   2-D echo 12/29/15: Result status: Final result     Left ventricle: Normal cavity size. Concentric hypertrophy of moderate severity. LV systolic function is normal with an EF of 60-65%. Grade I (mild, abnormal relaxation) left ventricular diastolic dysfunction. Elevated left atrial pressure. There are no obvious wall motion abnormalities.  Left atrium: Cavity is moderately dilated.  Mitral valve: Trace regurgitation.  Right ventricle: Normal cavity size and ejection fraction.  Tricuspid valve: Trace regurgitation.  Aortic valve: No stenosis. Mild regurgitation.         Discharge Exam:  Complaints: Lips and facial swelling resolved. No airway related complaints. Hives had mostly resolved but noticed 1 lesion on left upper arm. Pruritus improved. No chest pain for more than 2 days.  Filed Vitals:   12/29/15 1431 12/29/15 1842 12/29/15 2204 12/30/15 0500   BP:  135/56 139/63 177/79  Pulse: 78  54 59  Temp: 98 F (36.7 C)  98.1 F (36.7 C) 97.7 F (36.5 C)  TempSrc: Oral  Oral Oral  Resp: 17     Height:      Weight:    92.307 kg (203 lb 8 oz)  SpO2: 98%  97% 100%    Patient was examined with a female 50 W. Geneticist, molecular as chaperone in the room. General exam: Pleasant elderly female sitting up comfortably in bed. Son at bedside. Respiratory system: Clear to auscultation. Respiratory effort normal. Cardiovascular system: S1 & S2 heard, RRR. No JVD, murmurs, rubs, gallops or clicks. No pedal edema.Telemetry: SR, PACs. SB in the high 40s-50s at times over night. Gastrointestinal system: Abdomen is nondistended, soft and nontender. No organomegaly or masses felt. Normal bowel sounds heard. Central nervous system: Alert and oriented. No focal neurological deficits. Extremities: Symmetric 5 x 5 power. Skin: Facial and lip swelling resolved. No strong swelling. Single approximately 1 cm diameter wheal over left upper arm. Areas of superficial bruising over the upper medial thighs, left side of trunk/flank. No other acute issues. Psychiatry: Judgement and insight appear normal. Mood & affect appropriate.   Discharge Instructions      Discharge Instructions    Call MD for:  difficulty breathing, headache or visual disturbances    Complete by:  As directed      Call MD for:  extreme fatigue    Complete by:  As directed      Call MD for:  persistant dizziness or light-headedness  Complete by:  As directed      Call MD for:  redness, tenderness, or signs of infection (pain, swelling, redness, odor or green/yellow discharge around incision site)    Complete by:  As directed      Call MD for:  severe uncontrolled pain    Complete by:  As directed      Call MD for:    Complete by:  As directed   Worsening skin rash or allergic reaction.     Diet - low sodium heart healthy    Complete by:  As directed      Increase activity  slowly    Complete by:  As directed             Medication List    STOP taking these medications        lisinopril 40 MG tablet  Commonly known as:  PRINIVIL,ZESTRIL      TAKE these medications        acetaminophen 500 MG tablet  Commonly known as:  TYLENOL  Take 1,000 mg by mouth as needed for mild pain.     amLODipine 10 MG tablet  Commonly known as:  NORVASC  Take 1 tablet (10 mg total) by mouth daily.     atenolol 25 MG tablet  Commonly known as:  TENORMIN  TAKE 1 TABLET BY MOUTH EVERY DAY     diphenhydrAMINE 12.5 MG/5ML elixir  Commonly known as:  BENADRYL  Take 10 mLs (25 mg total) by mouth every 6 (six) hours as needed for itching or allergies.     famotidine 20 MG tablet  Commonly known as:  PEPCID  Take 1 tablet (20 mg total) by mouth 2 (two) times daily.     fluocinonide-emollient 0.05 % cream  Commonly known as:  LIDEX-E  Apply 1 application topically 2 (two) times daily.     hydrochlorothiazide 25 MG tablet  Commonly known as:  HYDRODIURIL  TAKE 1 TABLET BY MOUTH EVERY DAY     HYDROcodone-acetaminophen 7.5-325 MG tablet  Commonly known as:  NORCO  Take 1 tablet by mouth 2 (two) times daily as needed. For pain     magnesium hydroxide 400 MG/5ML suspension  Commonly known as:  MILK OF MAGNESIA  Take by mouth daily as needed for mild constipation.     meclizine 12.5 MG tablet  Commonly known as:  ANTIVERT  Take 1 tablet (12.5 mg total) by mouth 3 (three) times daily as needed for dizziness.     polyethylene glycol powder powder  Commonly known as:  GLYCOLAX/MIRALAX  Dissolve 17 grams in at least 8 ounces water/juice and drink once daily     predniSONE 10 MG tablet  Commonly known as:  DELTASONE  Take 4 tabs daily for 2 days, then reduce by 1 tablet every 2 days to finally discontinue.  Start taking on:  12/31/2015     traMADol 50 MG tablet  Commonly known as:  ULTRAM  TAKE 1 TABLET BY MOUTH EVERY 6 HOURS AS NEEDED FOR PAIN       Follow-up  Information    Follow up with Truitt Merle, NP On 01/20/2016.   Specialties:  Nurse Practitioner, Interventional Cardiology, Cardiology, Radiology   Why:  1:30PM. Cardiology followup.    Contact information:   [place] [place] [address] [phone]       Follow up with Arnette Norris, MD. Schedule an appointment as soon as possible for a visit in 3  days.   Specialty:  Family Medicine   Contact information:   [place] [address] [phone]       Get Medicines reviewed and adjusted: Please take all your medications with you for your next visit with your Primary MD  Please request your Primary MD to go over all hospital tests and procedure/radiological results at the follow up. Please ask your Primary MD to get all Hospital records sent to his/her office.  If you experience worsening of your admission symptoms, develop shortness of breath, life threatening emergency, suicidal or homicidal thoughts you must seek medical attention immediately by calling 911 or calling your MD immediately if symptoms less severe.  You must read complete instructions/literature along with all the possible adverse reactions/side effects for all the Medicines you take and that have been prescribed to you. Take any new Medicines after you have completely understood and accept all the possible adverse reactions/side effects.   Do not drive when taking pain medications.   Do not take more than prescribed Pain, Sleep and Anxiety Medications  Special Instructions: If you have smoked or chewed Tobacco in the last 2 yrs please stop smoking, stop any regular Alcohol and or any Recreational drug use.  Wear Seat belts while driving.  Please note  You were cared for by a hospitalist during your hospital stay. Once you are discharged, your primary care physician will handle any further medical issues. Please note that NO REFILLS for any discharge medications will be  authorized once you are discharged, as it is imperative that you return to your primary care physician (or establish a relationship with a primary care physician if you do not have one) for your aftercare needs so that they can reassess your need for medications and monitor your lab values.    The results of significant diagnostics from this hospitalization (including imaging, microbiology, ancillary and laboratory) are listed below for reference.    Significant Diagnostic Studies: Dg Chest 2 View  12/27/2015  CLINICAL DATA:  Mid chest pain. Radiating into left shoulder and left arm. Shortness of breath for 2-3 days. EXAM: CHEST  2 VIEW COMPARISON:  12/18/2010 FINDINGS: Heart and mediastinal contours are within normal limits. No focal opacities or effusions. No acute bony abnormality. IMPRESSION: No active cardiopulmonary disease. Electronically Signed   By: Rolm Baptise M.D.   On: 12/27/2015 14:25   Ct Angio Chest Pe W/cm &/or Wo Cm  12/27/2015  CLINICAL DATA:  Chest pain with nausea and dizziness EXAM: CT ANGIOGRAPHY CHEST WITH CONTRAST TECHNIQUE: Multidetector CT imaging of the chest was performed using the standard protocol during bolus administration of intravenous contrast. Multiplanar CT image reconstructions and MIPs were obtained to evaluate the vascular anatomy. CONTRAST:  80 mL Isovue 370 nonionic COMPARISON:  Chest CT January 27, 2006; chest radiograph December 27, 2015 FINDINGS: Mediastinum/Lymph Nodes: There is no demonstrable pulmonary embolus. There is no thoracic aortic aneurysm or dissection. Visualized great vessels appear unremarkable. There are scattered foci of atherosclerotic calcification in the aorta. There are scattered foci of coronary artery calcification. The pericardium is not appreciably thickened. Thyroid appears unremarkable. There are scattered small mediastinal lymph nodes. There is a lymph node in the sub- carinal region measuring 1.6 x 0.9 cm. There is a lymph node in the right  hilum measuring 1.4 x 1.2 cm. There is a lymph node in the left hilum measuring 1.0 x 0.8 cm. These lymph nodes appear stable. Lungs/Pleura: There is mild atelectatic change in the  lung bases. There is also mild atelectasis in the inferior lingula. There is scarring in both lung apices with several scattered small bullae in the lung apices. There is no edema or consolidation. Upper abdomen: In the visualized upper abdomen, there is evidence of reflux of contrast into the inferior vena cava and hepatic veins. Visualized upper abdominal structures otherwise appear unremarkable. Musculoskeletal: There is degenerative change in the thoracic spine. There are no blastic or lytic bone lesions. Review of the MIP images confirms the above findings. IMPRESSION: No demonstrable pulmonary embolus. Scattered foci of coronary artery calcification noted. Several borderline prominent lymph nodes are stable compared to prior study from 2007. Areas of mild lung scarring and atelectasis present. No parenchymal lung edema or consolidation. Reflux of contrast into the inferior vena cava and hepatic veins may indicate elevated right heart pressure. Electronically Signed   By: Lowella Grip III M.D.   On: 12/27/2015 17:49   Nm Myocar Multi W/spect W/wall Motion / Ef  12/28/2015  CLINICAL DATA:  Chest pain and diaphoresis. Hypertension, hyperlipidemia, and vertigo. EXAM: MYOCARDIAL IMAGING WITH SPECT (REST AND PHARMACOLOGIC-STRESS) GATED LEFT VENTRICULAR WALL MOTION STUDY LEFT VENTRICULAR EJECTION FRACTION TECHNIQUE: Standard myocardial SPECT imaging was performed after resting intravenous injection of 10 mCi Tc-18m tetrofosmin. Subsequently, intravenous infusion of Lexiscan was performed under the supervision of the Cardiology staff. At peak effect of the drug, 30 mCi Tc-74m tetrofosmin was injected intravenously and standard myocardial SPECT imaging was performed. Quantitative gated imaging was also performed to evaluate left  ventricular wall motion, and estimate left ventricular ejection fraction. COMPARISON:  None. FINDINGS: Perfusion: Moderate area of decreased myocardial activity seen in the lateral and inferior walls of the left ventricle on stress images, which shows reversibility on resting images, suspicious for myocardial ischemia. (Summed difference score of 12). Wall Motion: Septal hypokinesis noted. No evidence of left ventricular dilatation. Left Ventricular Ejection Fraction: 61 % End diastolic volume 47 ml End systolic volume 19 ml IMPRESSION: 1. Moderate reversible myocardial perfusion defect in the lateral and inferior left ventricular walls, suspicious for myocardial ischemia. 2. Septal hypokinesis noted.  No left ventricular dilatation. 3. Left ventricular ejection fraction 61% 4. Non invasive risk stratification*: Intermediate *2012 Appropriate Use Criteria for Coronary Revascularization Focused Update: J Am Coll Cardiol. [id number]. [email or web address]://[email or web address] Electronically Signed   By: Earle Gell M.D.   On: 12/28/2015 15:32    Microbiology: No results found for this or any previous visit (from the past 240 hour(s)).   Labs: Basic Metabolic Panel:  Recent Labs Lab 12/27/15 1338  NA 137  K 3.5  CL 104  CO2 25  GLUCOSE 118*  BUN 12  CREATININE 0.65  CALCIUM 10.1   Liver Function Tests: No results for input(s): AST, ALT, ALKPHOS, BILITOT, PROT, ALBUMIN in the last 168 hours. No results for input(s): LIPASE, AMYLASE in the last 168 hours. No results for input(s): AMMONIA in the last 168 hours. CBC:  Recent Labs Lab 12/27/15 1338  WBC 6.3  HGB 12.9  HCT 38.1  MCV 83.9  PLT 182   Cardiac Enzymes:  Recent Labs Lab 12/28/15 0307 12/28/15 0704 12/28/15 1322  TROPONINI <0.03 <0.03 <0.03   BNP: BNP (last 3 results) No results for input(s): BNP in the last 8760 hours.  ProBNP (last 3 results) No results for input(s): PROBNP in the last  8760 hours.  CBG: No results for input(s): GLUCAP in the last 168 hours.     Signed:  Vernell Leep, MD, FACP, FHM. [facility] Pager  [phone]  If 7PM-7AM, please contact night-coverage [email or web address] Password [facility] 12/30/2015, 12:24 PM

## 2015-12-30 NOTE — Progress Notes (Signed)
Patient Name: Heather Shaw Date of Encounter: 12/30/2015  Primary Cardiologist: Dr. Johnsie Cancel   Patient profile: 75 yo female with PMH of HTN/HLD/DDD/Vertigo and interstitial cystitis who presented to the [facility] ED on 12/27/2015 with reports of chest pain with diaphoresis and radiation into the left arm. Myoview negative  Principal Problem:   Chest pain Active Problems:   HLD (hyperlipidemia)   Essential hypertension   Bradycardia   SVT (supraventricular tachycardia) (HCC)   Angio-edema    SUBJECTIVE  Denies any CP or SOB. Overnight, the hives started back on L flank again  CURRENT MEDS . amLODipine  10 mg Oral Daily  . aspirin EC  325 mg Oral Daily  . atenolol  25 mg Oral Daily  . famotidine (PEPCID) IV  20 mg Intravenous Q12H  . fluocinonide-emollient  1 application Topical BID  . hydrochlorothiazide  25 mg Oral Daily  . LORazepam  0.5 mg Oral Once  . predniSONE  40 mg Oral Q breakfast    OBJECTIVE  Filed Vitals:   12/29/15 1431 12/29/15 1842 12/29/15 2204 12/30/15 0500  BP:  135/56 139/63 177/79  Pulse: 78  54 59  Temp: 98 F (36.7 C)  98.1 F (36.7 C) 97.7 F (36.5 C)  TempSrc: Oral  Oral Oral  Resp: 17     Height:      Weight:    203 lb 8 oz (92.307 kg)  SpO2: 98%  97% 100%    Intake/Output Summary (Last 24 hours) at 12/30/15 0843 Last data filed at 12/29/15 1855  Gross per 24 hour  Intake    480 ml  Output      0 ml  Net    480 ml   Filed Weights   12/29/15 0312 12/29/15 0434 12/30/15 0500  Weight: 205 lb 12.8 oz (93.35 kg) 205 lb (92.987 kg) 203 lb 8 oz (92.307 kg)    PHYSICAL EXAM  General: Pleasant, NAD. Neuro: Alert and oriented X 3. Moves all extremities spontaneously. Psych: Normal affect. HEENT:  Normal  Neck: Supple without bruits or JVD. Lungs:  Resp regular and unlabored, CTA. Heart: RRR no s3, s4, or murmurs. Abdomen: Soft, non-tender, non-distended, BS + x 4.  Extremities: No clubbing, cyanosis or edema. DP/PT/Radials 2+  and equal bilaterally.  Accessory Clinical Findings  CBC  Recent Labs  12/27/15 1338  WBC 6.3  HGB 12.9  HCT 38.1  MCV 83.9  PLT [id number]   Basic Metabolic Panel  Recent Labs  12/27/15 1338  NA 137  K 3.5  CL 104  CO2 25  GLUCOSE 118*  BUN 12  CREATININE 0.65  CALCIUM 10.1   Cardiac Enzymes  Recent Labs  12/28/15 0307 12/28/15 0704 12/28/15 1322  TROPONINI <0.03 <0.03 <0.03   D-Dimer  Recent Labs  12/27/15 1444  DDIMER 2.36*   Fasting Lipid Panel  Recent Labs  12/29/15 0502  CHOL 191  HDL 40*  LDLCALC 134*  TRIG 84  CHOLHDL 4.8    TELE NSR with HR 40-50s    ECG  No new EKG  Echocardiogram 12/29/2015   Result status: Final result    Left ventricle: Normal cavity size. Concentric hypertrophy of moderate severity. LV systolic function is normal with an EF of 60-65%. Grade I (mild, abnormal relaxation) left ventricular diastolic dysfunction. Elevated left atrial pressure. There are no obvious wall motion abnormalities.  Left atrium: Cavity is moderately dilated.  Mitral valve: Trace regurgitation.  Right ventricle: Normal cavity size and ejection fraction.  Tricuspid valve: Trace regurgitation.  Aortic valve: No stenosis. Mild regurgitation.       Radiology/Studies  Dg Chest 2 View  12/27/2015  CLINICAL DATA:  Mid chest pain. Radiating into left shoulder and left arm. Shortness of breath for 2-3 days. EXAM: CHEST  2 VIEW COMPARISON:  12/18/2010 FINDINGS: Heart and mediastinal contours are within normal limits. No focal opacities or effusions. No acute bony abnormality. IMPRESSION: No active cardiopulmonary disease. Electronically Signed   By: Rolm Baptise M.D.   On: 12/27/2015 14:25   Ct Angio Chest Pe W/cm &/or Wo Cm  12/27/2015  CLINICAL DATA:  Chest pain with nausea and dizziness EXAM: CT ANGIOGRAPHY CHEST WITH CONTRAST TECHNIQUE: Multidetector CT imaging of the chest was performed using the standard protocol during bolus  administration of intravenous contrast. Multiplanar CT image reconstructions and MIPs were obtained to evaluate the vascular anatomy. CONTRAST:  80 mL Isovue 370 nonionic COMPARISON:  Chest CT January 27, 2006; chest radiograph December 27, 2015 FINDINGS: Mediastinum/Lymph Nodes: There is no demonstrable pulmonary embolus. There is no thoracic aortic aneurysm or dissection. Visualized great vessels appear unremarkable. There are scattered foci of atherosclerotic calcification in the aorta. There are scattered foci of coronary artery calcification. The pericardium is not appreciably thickened. Thyroid appears unremarkable. There are scattered small mediastinal lymph nodes. There is a lymph node in the sub- carinal region measuring 1.6 x 0.9 cm. There is a lymph node in the right hilum measuring 1.4 x 1.2 cm. There is a lymph node in the left hilum measuring 1.0 x 0.8 cm. These lymph nodes appear stable. Lungs/Pleura: There is mild atelectatic change in the lung bases. There is also mild atelectasis in the inferior lingula. There is scarring in both lung apices with several scattered small bullae in the lung apices. There is no edema or consolidation. Upper abdomen: In the visualized upper abdomen, there is evidence of reflux of contrast into the inferior vena cava and hepatic veins. Visualized upper abdominal structures otherwise appear unremarkable. Musculoskeletal: There is degenerative change in the thoracic spine. There are no blastic or lytic bone lesions. Review of the MIP images confirms the above findings. IMPRESSION: No demonstrable pulmonary embolus. Scattered foci of coronary artery calcification noted. Several borderline prominent lymph nodes are stable compared to prior study from 2007. Areas of mild lung scarring and atelectasis present. No parenchymal lung edema or consolidation. Reflux of contrast into the inferior vena cava and hepatic veins may indicate elevated right heart pressure. Electronically Signed    By: Lowella Grip III M.D.   On: 12/27/2015 17:49   Nm Myocar Multi W/spect W/wall Motion / Ef  12/28/2015  CLINICAL DATA:  Chest pain and diaphoresis. Hypertension, hyperlipidemia, and vertigo. EXAM: MYOCARDIAL IMAGING WITH SPECT (REST AND PHARMACOLOGIC-STRESS) GATED LEFT VENTRICULAR WALL MOTION STUDY LEFT VENTRICULAR EJECTION FRACTION TECHNIQUE: Standard myocardial SPECT imaging was performed after resting intravenous injection of 10 mCi Tc-72m tetrofosmin. Subsequently, intravenous infusion of Lexiscan was performed under the supervision of the Cardiology staff. At peak effect of the drug, 30 mCi Tc-14m tetrofosmin was injected intravenously and standard myocardial SPECT imaging was performed. Quantitative gated imaging was also performed to evaluate left ventricular wall motion, and estimate left ventricular ejection fraction. COMPARISON:  None. FINDINGS: Perfusion: Moderate area of decreased myocardial activity seen in the lateral and inferior walls of the left ventricle on stress images, which shows reversibility on resting images, suspicious for myocardial ischemia. (Summed difference score of 12). Wall Motion: Septal hypokinesis noted. No evidence of  left ventricular dilatation. Left Ventricular Ejection Fraction: 61 % End diastolic volume 47 ml End systolic volume 19 ml IMPRESSION: 1. Moderate reversible myocardial perfusion defect in the lateral and inferior left ventricular walls, suspicious for myocardial ischemia. 2. Septal hypokinesis noted.  No left ventricular dilatation. 3. Left ventricular ejection fraction 61% 4. Non invasive risk stratification*: Intermediate *2012 Appropriate Use Criteria for Coronary Revascularization Focused Update: J Am Coll Cardiol. [id number]. http://content.[email or web address].aspx?[email or web address] Electronically Signed   By: Earle Gell M.D.   On: 12/28/2015 15:32    ASSESSMENT AND PLAN  1. Chest pain  - CTA negative PE, scattered foci of coronary  artery calcification.   - official report by radiologist interpreted as normal EF, moderate reversible ischemia in lateral and inferior LV. Image reviewed by Dr. Sallyanne Kuster felt no ischemia, but possible diaphragmatic attenuation instead.   - chest pain resolved. Echo 12/29/2015 EF 60-65%, grade 1 DD, no RWMA, mild AR. No further workup  2. HTN: 40mg  lisinopril discontinued, changed to amlodipine which uptitrated to 10mg  daily today.  3. HLD: her LDL is 130, given scattered coronary artery calcification noted on CTA, consider add 20mg  lipitor.  4. Baseline Bradycardia: occasional HR in 40s  5. Sinus tach with SVT  - in the setting of possible allergic reaction. 1 prolonged episode from 11:00AM until 12:18PM on 12/28/2015. Unable to uptitrate atenolol as her baseline HR sometimes brady down to high 40s. I am leaning more toward monitor for recurrence for now. If this is truly relate to allergic rxn, would not expect recurrence as her allergic symptom has resolved  - no further tachycardia. However it appears she is starting to have hives again, but not lip swelling. I discussed with pharmacist, there is a possibility that patient have allergic reaction years after taking ACEI. Therefore will hold off on starting ACEI for now. Amlodipine increased to 10mg  daily.  6. Elevated d-dimer with negative CTA for PE  7. Lip swelling and hives: unclear cause, maybe related to medication she received during lexiscan or CTA contrast from the night before. Cannot r/o ACEI either per pharmacist. Lisinopril changed to amlodipine   Signed, Almyra Deforest PA-C Pager: [phone]   I have examined the patient and reviewed assessment and plan and discussed with patient.  Agree with above as stated.  No further SVT.  Continue atenolol.  Not sure that ACE-I caused her issues.  Holding for now.  WOuld pretreat in the future for IV contrast given recent reaction.  Larae Grooms

## 2015-12-31 ENCOUNTER — Telehealth: Payer: Self-pay | Admitting: *Deleted

## 2015-12-31 NOTE — Telephone Encounter (Signed)
Transitional Care Management call made to patient. Left message on answering machine for patient to call back.

## 2016-01-01 ENCOUNTER — Telehealth: Payer: Self-pay | Admitting: *Deleted

## 2016-01-01 NOTE — Telephone Encounter (Signed)
-----   Message from Larina Bras, [place] sent at 12/29/2015  9:32 AM EDT ----- Patient currently hospitalized. We will contact her at a later date with this information. ----- Message -----    From: Larina Bras, [place]: 12/29/2015      To: Larina Bras, CMA  Left voicemail to call back.    ----- Message -----    From: Jerene Bears, MD    Sent: 12/26/2015   1:02 PM      To: Larina Bras, CMA  Please ask patient if she ever saw GYN for the large ovarian cyst seen by CT in March 2017.  I had previously stressed this with her.

## 2016-01-01 NOTE — Telephone Encounter (Signed)
Transition Care Management Follow-up Telephone Call   Date discharged? 12/30/15   How have you been since you were released from the hospital? Doing well, improved.   Do you understand why you were in the hospital? yes   Do you understand the discharge instructions? yes   Where were you discharged to? home   Items Reviewed:  Medications reviewed: yes  Allergies reviewed: yes, new allergies to lisinopril and IV contrast  Dietary changes reviewed: no  Referrals reviewed: yes, cardiology 6/27   Functional Questionnaire:   Activities of Daily Living (ADLs):   She states they are independent in the following: ambulation, bathing and hygiene, feeding, continence, grooming, toileting and dressing States they require assistance with the following: none - back to baseline   Any transportation issues/concerns?: no   Any patient concerns? no   Confirmed importance and date/time of follow-up visits scheduled yes, 01/12/16 @ 1400  Provider Appointment booked with Arnette Norris, MD  Confirmed with patient if condition begins to worsen call PCP or go to the ER.  Patient was given the office number and encouraged to call back with question or concerns.  : yes

## 2016-01-01 NOTE — Telephone Encounter (Signed)
Patient returned Regina's call.  Patient can be reached at [phone].

## 2016-01-01 NOTE — Telephone Encounter (Signed)
Transitional care call attempted.  Left message for patient to return call. 

## 2016-01-01 NOTE — Telephone Encounter (Signed)
Patient states that she did follow up with Dr Concha Norway and was advised that the ovarian cyst has not changed in size since last evaluation and no need to worry.

## 2016-01-08 ENCOUNTER — Other Ambulatory Visit: Payer: Self-pay | Admitting: Cardiovascular Disease

## 2016-01-12 ENCOUNTER — Ambulatory Visit (INDEPENDENT_AMBULATORY_CARE_PROVIDER_SITE_OTHER): Payer: Medicare Other | Admitting: Family Medicine

## 2016-01-12 ENCOUNTER — Encounter: Payer: Self-pay | Admitting: Family Medicine

## 2016-01-12 VITALS — BP 118/74 | HR 59 | Temp 98.6°F | Wt 206.2 lb

## 2016-01-12 DIAGNOSIS — T783XXD Angioneurotic edema, subsequent encounter: Secondary | ICD-10-CM | POA: Diagnosis not present

## 2016-01-12 DIAGNOSIS — I471 Supraventricular tachycardia, unspecified: Secondary | ICD-10-CM

## 2016-01-12 DIAGNOSIS — R0789 Other chest pain: Secondary | ICD-10-CM | POA: Diagnosis not present

## 2016-01-12 DIAGNOSIS — Z09 Encounter for follow-up examination after completed treatment for conditions other than malignant neoplasm: Secondary | ICD-10-CM

## 2016-01-12 DIAGNOSIS — M5441 Lumbago with sciatica, right side: Secondary | ICD-10-CM | POA: Diagnosis not present

## 2016-01-12 DIAGNOSIS — I10 Essential (primary) hypertension: Secondary | ICD-10-CM

## 2016-01-12 DIAGNOSIS — M25561 Pain in right knee: Secondary | ICD-10-CM | POA: Diagnosis not present

## 2016-01-12 NOTE — Progress Notes (Signed)
Pre visit review using our clinic review tool, if applicable. No additional management support is needed unless otherwise documented below in the visit note. 

## 2016-01-12 NOTE — Assessment & Plan Note (Signed)
Episode documented in hospital. Keep appt with cardiology scheduled for 6/27.

## 2016-01-12 NOTE — Assessment & Plan Note (Signed)
Well controlled on current rxs. No changes made. 

## 2016-01-12 NOTE — Assessment & Plan Note (Signed)
Resolved. Likely due to IV contrast (lisinopril also d/c'd)

## 2016-01-12 NOTE — Assessment & Plan Note (Signed)
Resolved. Keep follow up with cardiology. Heather Shaw feels it was due to her back pain.  Appointment with ortho this afternoon.

## 2016-01-12 NOTE — Patient Instructions (Signed)
Good to see you. Please keep your appointment with cardiology. Update me after your orthopedist appointment.

## 2016-01-12 NOTE — Progress Notes (Signed)
Subjective:   Patient ID: Heather Shaw, female    DOB: 07-25-1941, 75 y.o.   MRN: [id number]  Heather Shaw is a pleasant 75 y.o. year old female pt with h/o HLD, HTN who presents to clinic today with Hospitalization Follow-up  on 01/12/2016  HPI:  Admitted to [facility] 6/3- 12/30/2015 after she presented with CP.  Notes and studies reviewed.  Presented with h/o of progressive DOE (over months) and CP over 2 days with radiation of pain to left arm.  Some relief with sublingual NTD.  Hypertensive in ambulance to 190/120.  Cardiology consulted.  EKG without acute findings.  CTA neg for PE. Nuclear stress test- intermediate risk but cardiology felt no ischemia, but possible diaphragmatic attenuation instead.  Felt chest pain syndrome atypical and more positional than exertional.2 D echo- LV EFT 60- 65% Advised d/c home without ASA.  Angioedema- ? IV constrast- occurred before stress test.  IV solumedrol given.  Lisinopril D/c;d.  Buddy Duty on oral pred taper, pepcid and benadryl.  SVT noted on 6/4 from 11- 12:18 pm.  ? Due to allergic reaction.  She is scheduled follow up with cardiology.  HTN- lisinopril d/c'd.  Currently taking Norvasc 10 mg daily, atenolol 25 mg daily.  SOB and CP have resolved.  Chronic back pain is her only complaint.  Dg Chest 2 View  12/27/2015  CLINICAL DATA:  Mid chest pain. Radiating into left shoulder and left arm. Shortness of breath for 2-3 days. EXAM: CHEST  2 VIEW COMPARISON:  12/18/2010 FINDINGS: Heart and mediastinal contours are within normal limits. No focal opacities or effusions. No acute bony abnormality. IMPRESSION: No active cardiopulmonary disease. Electronically Signed   By: Rolm Baptise M.D.   On: 12/27/2015 14:25   Ct Angio Chest Pe W/cm &/or Wo Cm  12/27/2015  CLINICAL DATA:  Chest pain with nausea and dizziness EXAM: CT ANGIOGRAPHY CHEST WITH CONTRAST TECHNIQUE: Multidetector CT imaging of the chest was performed using the standard  protocol during bolus administration of intravenous contrast. Multiplanar CT image reconstructions and MIPs were obtained to evaluate the vascular anatomy. CONTRAST:  80 mL Isovue 370 nonionic COMPARISON:  Chest CT January 27, 2006; chest radiograph December 27, 2015 FINDINGS: Mediastinum/Lymph Nodes: There is no demonstrable pulmonary embolus. There is no thoracic aortic aneurysm or dissection. Visualized great vessels appear unremarkable. There are scattered foci of atherosclerotic calcification in the aorta. There are scattered foci of coronary artery calcification. The pericardium is not appreciably thickened. Thyroid appears unremarkable. There are scattered small mediastinal lymph nodes. There is a lymph node in the sub- carinal region measuring 1.6 x 0.9 cm. There is a lymph node in the right hilum measuring 1.4 x 1.2 cm. There is a lymph node in the left hilum measuring 1.0 x 0.8 cm. These lymph nodes appear stable. Lungs/Pleura: There is mild atelectatic change in the lung bases. There is also mild atelectasis in the inferior lingula. There is scarring in both lung apices with several scattered small bullae in the lung apices. There is no edema or consolidation. Upper abdomen: In the visualized upper abdomen, there is evidence of reflux of contrast into the inferior vena cava and hepatic veins. Visualized upper abdominal structures otherwise appear unremarkable. Musculoskeletal: There is degenerative change in the thoracic spine. There are no blastic or lytic bone lesions. Review of the MIP images confirms the above findings. IMPRESSION: No demonstrable pulmonary embolus. Scattered foci of coronary artery calcification noted. Several borderline prominent lymph nodes are stable compared to  prior study from 2007. Areas of mild lung scarring and atelectasis present. No parenchymal lung edema or consolidation. Reflux of contrast into the inferior vena cava and hepatic veins may indicate elevated right heart pressure.  Electronically Signed   By: Lowella Grip III M.D.   On: 12/27/2015 17:49   Nm Myocar Multi W/spect W/wall Motion / Ef  12/28/2015  CLINICAL DATA:  Chest pain and diaphoresis. Hypertension, hyperlipidemia, and vertigo. EXAM: MYOCARDIAL IMAGING WITH SPECT (REST AND PHARMACOLOGIC-STRESS) GATED LEFT VENTRICULAR WALL MOTION STUDY LEFT VENTRICULAR EJECTION FRACTION TECHNIQUE: Standard myocardial SPECT imaging was performed after resting intravenous injection of 10 mCi Tc-43m tetrofosmin. Subsequently, intravenous infusion of Lexiscan was performed under the supervision of the Cardiology staff. At peak effect of the drug, 30 mCi Tc-8m tetrofosmin was injected intravenously and standard myocardial SPECT imaging was performed. Quantitative gated imaging was also performed to evaluate left ventricular wall motion, and estimate left ventricular ejection fraction. COMPARISON:  None. FINDINGS: Perfusion: Moderate area of decreased myocardial activity seen in the lateral and inferior walls of the left ventricle on stress images, which shows reversibility on resting images, suspicious for myocardial ischemia. (Summed difference score of 12). Wall Motion: Septal hypokinesis noted. No evidence of left ventricular dilatation. Left Ventricular Ejection Fraction: 61 % End diastolic volume 47 ml End systolic volume 19 ml IMPRESSION: 1. Moderate reversible myocardial perfusion defect in the lateral and inferior left ventricular walls, suspicious for myocardial ischemia. 2. Septal hypokinesis noted.  No left ventricular dilatation. 3. Left ventricular ejection fraction 61% 4. Non invasive risk stratification*: Intermediate *2012 Appropriate Use Criteria for Coronary Revascularization Focused Update: J Am Coll Cardiol. [id number]. http://content.[email or web address]?[email or web address] Electronically Signed   By: Earle Gell M.D.   On: 12/28/2015 15:32   Current Outpatient Prescriptions on File Prior to Visit    Medication Sig Dispense Refill  . acetaminophen (TYLENOL) 500 MG tablet Take 1,000 mg by mouth as needed for mild pain.     Marland Kitchen amLODipine (NORVASC) 10 MG tablet Take 1 tablet (10 mg total) by mouth daily. 30 tablet 0  . atenolol (TENORMIN) 25 MG tablet TAKE 1 TABLET BY MOUTH EVERY DAY 30 tablet 0  . fluocinonide-emollient (LIDEX-E) 0.05 % cream Apply 1 application topically 2 (two) times daily. 30 g 0  . hydrochlorothiazide (HYDRODIURIL) 25 MG tablet TAKE 1 TABLET BY MOUTH EVERY DAY 30 tablet 11  . HYDROcodone-acetaminophen (NORCO) 7.5-325 MG tablet Take 1 tablet by mouth 2 (two) times daily as needed. For pain    . magnesium hydroxide (MILK OF MAGNESIA) 400 MG/5ML suspension Take by mouth daily as needed for mild constipation.    . meclizine (ANTIVERT) 12.5 MG tablet Take 1 tablet (12.5 mg total) by mouth 3 (three) times daily as needed for dizziness. 30 tablet 0  . polyethylene glycol powder (GLYCOLAX/MIRALAX) powder Dissolve 17 grams in at least 8 ounces water/juice and drink once daily 527 g 3  . traMADol (ULTRAM) 50 MG tablet TAKE 1 TABLET BY MOUTH EVERY 6 HOURS AS NEEDED FOR PAIN 60 tablet 0   No current facility-administered medications on file prior to visit.    Allergies  Allergen Reactions  . Contrast Media [Iodinated Diagnostic Agents]     ? angioedema  . Lisinopril     ? angioedema  . Allegra [Fexofenadine] Other (See Comments)    Makes pt nervous  . Codeine Nausea And Vomiting  . Prednisone     Causes elevation in blood pressure- ? All steroids cause same reaction  . Zithromax [Azithromycin]  Other (See Comments)    Burns stomach  . Sulfa Antibiotics Rash    Past Medical History  Diagnosis Date  . Hyperlipidemia   . Hypertension   . Vertigo   . DDD (degenerative disc disease), lumbosacral   . Arthritis   . IC (interstitial cystitis)   . PONV (postoperative nausea and vomiting)   . Bladder pain   . Uterine fibroid   . Wears glasses   . Wears partial dentures    . Polyp of rectum   . Internal hemorrhoids   . Hyperplastic colon polyp   . Squamous papilloma     in esophageal polyp  . Bradycardia     Past Surgical History  Procedure Laterality Date  . Knee arthroscopy Right 11-18-2009  . Cysto/  hydrodistention/  instillation clorpactic  09-21-2010  . Dilation and curettage of uterus  1988  . Colonoscopy w/ polypectomy  2009  . Cysto with hydrodistension N/A 05/20/2014    Procedure: CYSTOSCOPY/HYDRODISTENSION, INSTILLATION OF CHLORPACTIN;  Surgeon: Bernestine Amass, MD;  [facility];  Service: Urology;  Laterality: N/A;    Family History  Problem Relation Age of Onset  . COPD Father   . Heart attack Father 48  . Emphysema Father   . Stroke Paternal Aunt   . Clotting disorder Mother 52  . Hypertension Sister   . Hypertension Brother     Social History   Social History  . Marital Status: Married    Spouse Name: N/A  . Number of Children: 6  . Years of Education: N/A   Occupational History  . housewife    Social History Main Topics  . Smoking status: Former Smoker    Types: Cigarettes    Quit date: 05/17/1971  . Smokeless tobacco: Never Used  . Alcohol Use: No  . Drug Use: No  . Sexual Activity: No   Other Topics Concern  . Not on file   Social History Narrative   The PMH, PSH, Social History, Family History, Medications, and allergies have been reviewed in [facility], and have been updated if relevant.   Review of Systems  Constitutional: Negative.   HENT: Negative.   Eyes: Negative.   Respiratory: Negative.   Cardiovascular: Negative.   Endocrine: Negative.   Genitourinary: Negative.   Musculoskeletal: Positive for back pain.  Allergic/Immunologic: Negative.   Neurological: Negative.   Hematological: Negative.   Psychiatric/Behavioral: Negative.  Negative for agitation.  All other systems reviewed and are negative.      Objective:    BP 118/74 mmHg  Pulse 59  Temp(Src) 98.6 F (37  C) (Oral)  Wt 206 lb 4 oz (93.554 kg)  SpO2 98%   Physical Exam  Constitutional: She is oriented to person, place, and time. She appears well-developed and well-nourished. No distress.  HENT:  Head: Normocephalic.  Eyes: Conjunctivae are normal.  Cardiovascular: Normal rate and regular rhythm.   Pulmonary/Chest: Effort normal and breath sounds normal.  Musculoskeletal: Normal range of motion. She exhibits no edema.  Neurological: She is alert and oriented to person, place, and time. No cranial nerve deficit.  Skin: Skin is warm and dry. She is not diaphoretic.  Psychiatric: She has a normal mood and affect. Her behavior is normal. Judgment and thought content normal.  Nursing note and vitals reviewed.         Assessment & Plan:   SVT (supraventricular tachycardia) (HCC)  Other chest pain  Angio-edema, subsequent encounter  Essential hypertension No Follow-up on file.

## 2016-01-15 ENCOUNTER — Encounter: Payer: Self-pay | Admitting: Family Medicine

## 2016-01-19 ENCOUNTER — Other Ambulatory Visit: Payer: Self-pay | Admitting: Family Medicine

## 2016-01-19 NOTE — Telephone Encounter (Signed)
Last f/u 10/2015-CPE 

## 2016-01-19 NOTE — Telephone Encounter (Signed)
Rx called in to requested pharmacy 

## 2016-01-20 ENCOUNTER — Ambulatory Visit (INDEPENDENT_AMBULATORY_CARE_PROVIDER_SITE_OTHER): Payer: Medicare Other | Admitting: Nurse Practitioner

## 2016-01-20 ENCOUNTER — Encounter: Payer: Self-pay | Admitting: Nurse Practitioner

## 2016-01-20 VITALS — BP 148/68 | HR 64 | Ht 64.5 in | Wt 207.0 lb

## 2016-01-20 DIAGNOSIS — R0789 Other chest pain: Secondary | ICD-10-CM

## 2016-01-20 DIAGNOSIS — I10 Essential (primary) hypertension: Secondary | ICD-10-CM

## 2016-01-20 DIAGNOSIS — E785 Hyperlipidemia, unspecified: Secondary | ICD-10-CM

## 2016-01-20 NOTE — Patient Instructions (Addendum)
We will be checking the following labs today - NONE   Medication Instructions:    Continue with your current medicines.     Testing/Procedures To Be Arranged:  N/A  Follow-Up:   See us back as needed.     Other Special Instructions:   N/A    If you need a refill on your cardiac medications before your next appointment, please call your pharmacy.   Call the [facility] office at [phone] if you have any questions, problems or concerns.      

## 2016-01-20 NOTE — Progress Notes (Signed)
CARDIOLOGY OFFICE NOTE  Date:  01/20/2016    Barnet Pall Date of Birth: 12/31/1940 Medical Record [id number]  PCP:  Arnette Norris, MD  Cardiologist:  Johnsie Cancel   Chief Complaint  Patient presents with  . Irregular Heart Beat  . Hyperlipidemia  . Hypertension    Post hospital visit - seen for Dr. Johnsie Cancel    History of Present Illness: Reve Wareing Greenawalt is a 75 y.o. female who presents today for a post hospital visit. Seen for Dr. Johnsie Cancel.   She has a history of HLD, HTN, chronic intermittent vertigo, & interstitial cystitis.   Last seen here back in May of 2016.    Presented to [facility] ED on 12/27/15 with complaints of progressive DOE over a couple of months, 2 days history of intermittent left-sided chest pressure, woke up on night of admission with worsening chest pain with radiation to left upper extremity and dyspnea. Pain worsened by deep inspiration. EMS activated. Some relief with sublingual NTG and hypertensive with EMS 190/120. Cardiology consulted. She had nuclear stress testing - felt to be without ischemia after reviewed from Cardiology. BP treated. Had an allergic reaction while there - ACE stopped. Norvasc started. No aspirin per cardiology and to hold on statin therapy for now.  Echo with grade 1 diastolic dysfunction and EF of 60 to 65%. Did have SVT noted but felt to be due to her allergic response.        Comes back today. Here alone. Saw PCP earlier this month - felt to be doing well. She tells me she is doing great. No more chest pain. Even her vertigo has improved with the med change. Not short of breath anymore. She is very happy with how she is doing. BP good at home. She is due for her physical later this summer. She does wish she could be more active - has had back/knee issues that are currently being evaluated.   Past Medical History  Diagnosis Date  . Hyperlipidemia   . Hypertension   . Vertigo   . DDD (degenerative disc disease), lumbosacral   .  Arthritis   . IC (interstitial cystitis)   . PONV (postoperative nausea and vomiting)   . Bladder pain   . Uterine fibroid   . Wears glasses   . Wears partial dentures   . Polyp of rectum   . Internal hemorrhoids   . Hyperplastic colon polyp   . Squamous papilloma     in esophageal polyp  . Bradycardia     Past Surgical History  Procedure Laterality Date  . Knee arthroscopy Right 11-18-2009  . Cysto/  hydrodistention/  instillation clorpactic  09-21-2010  . Dilation and curettage of uterus  1988  . Colonoscopy w/ polypectomy  2009  . Cysto with hydrodistension N/A 05/20/2014    Procedure: CYSTOSCOPY/HYDRODISTENSION, INSTILLATION OF CHLORPACTIN;  Surgeon: Bernestine Amass, MD;  Location: [facility];  Service: Urology;  Laterality: N/A;     Medications: Current Outpatient Prescriptions  Medication Sig Dispense Refill  . acetaminophen (TYLENOL) 500 MG tablet Take 1,000 mg by mouth as needed for mild pain.     Marland Kitchen amLODipine (NORVASC) 10 MG tablet Take 1 tablet (10 mg total) by mouth daily. 30 tablet 0  . atenolol (TENORMIN) 25 MG tablet TAKE 1 TABLET BY MOUTH EVERY DAY 30 tablet 0  . fluocinonide-emollient (LIDEX-E) 0.05 % cream Apply 1 application topically 2 (two) times daily. 30 g 0  . hydrochlorothiazide (HYDRODIURIL) 25  MG tablet TAKE 1 TABLET BY MOUTH EVERY DAY 30 tablet 11  . HYDROcodone-acetaminophen (NORCO) 7.5-325 MG tablet Take 2 tablets by mouth as needed for moderate pain (knee pain).    . magnesium hydroxide (MILK OF MAGNESIA) 400 MG/5ML suspension Take by mouth daily as needed for mild constipation.    . meclizine (ANTIVERT) 12.5 MG tablet Take 1 tablet (12.5 mg total) by mouth 3 (three) times daily as needed for dizziness. 30 tablet 0  . polyethylene glycol powder (GLYCOLAX/MIRALAX) powder Dissolve 17 grams in at least 8 ounces water/juice and drink once daily 527 g 3  . tiZANidine (ZANAFLEX) 2 MG tablet     . traMADol (ULTRAM) 50 MG tablet TAKE 1  TABLET BY MOUTH EVERY 6 HOURS AS NEEDED FOR PAIN 60 tablet 0   No current facility-administered medications for this visit.    Allergies: Allergies  Allergen Reactions  . Contrast Media [Iodinated Diagnostic Agents]     ? angioedema  . Lisinopril     ? angioedema  . Allegra [Fexofenadine] Other (See Comments)    Makes pt nervous  . Codeine Nausea And Vomiting  . Prednisone     Causes elevation in blood pressure- ? All steroids cause same reaction  . Zithromax [Azithromycin] Other (See Comments)    Burns stomach  . Sulfa Antibiotics Rash    Social History: The patient  reports that she quit smoking about 44 years ago. Her smoking use included Cigarettes. She has never used smokeless tobacco. She reports that she does not drink alcohol or use illicit drugs.   Family History: The patient's family history includes COPD in her father; Clotting disorder (age of onset: 4) in her mother; Emphysema in her father; Heart attack (age of onset: 72) in her father; Hypertension in her brother and sister; Stroke in her paternal aunt.   Review of Systems: Please see the history of present illness.   Otherwise, the review of systems is positive for none.   All other systems are reviewed and negative.   Physical Exam: VS:  BP 148/68 mmHg  Pulse 64  Ht 5' 4.5" (1.638 m)  Wt 207 lb (93.895 kg)  BMI 35.00 kg/m2 .  BMI Body mass index is 35 kg/(m^2).  Wt Readings from Last 3 Encounters:  01/20/16 207 lb (93.895 kg)  01/12/16 206 lb 4 oz (93.554 kg)  12/30/15 203 lb 8 oz (92.307 kg)    General: Pleasant. Obese female who is alert and in no acute distress.  HEENT: Normal. Neck: Supple, no JVD, carotid bruits, or masses noted.  Cardiac: Regular rate and rhythm. No murmurs, rubs, or gallops. No edema.  Respiratory:  Lungs are clear to auscultation bilaterally with normal work of breathing.  GI: Soft and nontender.  MS: No deformity or atrophy. Gait and ROM intact. Skin: Warm and dry. Color  is normal.  Neuro:  Strength and sensation are intact and no gross focal deficits noted.  Psych: Alert, appropriate and with normal affect.   LABORATORY DATA:  EKG:  EKG is not ordered today.  Lab Results  Component Value Date   WBC 6.3 12/27/2015   HGB 12.9 12/27/2015   HCT 38.1 12/27/2015   PLT 182 12/27/2015   GLUCOSE 118* 12/27/2015   CHOL 191 12/29/2015   TRIG 84 12/29/2015   HDL 40* 12/29/2015   LDLDIRECT 119.0 02/12/2015   LDLCALC 134* 12/29/2015   ALT 16 10/02/2015   AST 21 10/02/2015   NA 137 12/27/2015   K 3.5  12/27/2015   CL 104 12/27/2015   CREATININE 0.65 12/27/2015   BUN 12 12/27/2015   CO2 25 12/27/2015   TSH 0.577 12/29/2015   HGBA1C 5.9* 12/29/2015    BNP (last 3 results) No results for input(s): BNP in the last 8760 hours.  ProBNP (last 3 results) No results for input(s): PROBNP in the last 8760 hours.   Other Studies Reviewed Today:  Echo Study Conclusions 12/2015  - Left ventricle: The cavity size was normal. There was moderate  concentric hypertrophy. Systolic function was normal. The  estimated ejection fraction was in the range of 60% to 65%. Wall  motion was normal; there were no regional wall motion  abnormalities. Doppler parameters are consistent with abnormal  left ventricular relaxation (grade 1 diastolic dysfunction).  Doppler parameters are consistent with elevated ventricular  end-diastolic filling pressure. - Aortic valve: Trileaflet; normal thickness leaflets. There was  mild regurgitation. - Aortic root: The aortic root was normal in size. - Mitral valve: Structurally normal valve. There was no  regurgitation. - Left atrium: The atrium was mildly dilated. - Right ventricle: The cavity size was normal. Wall thickness was  normal. Systolic function was normal. - Right atrium: The atrium was normal in size. - Pulmonary arteries: Systolic pressure was within the normal  range. - Inferior vena cava: The vessel was  normal in size. - Pericardium, extracardiac: There was no pericardial effusion.   MYOVIEW FINDINGS: Perfusion: Moderate area of decreased myocardial activity seen in the lateral and inferior walls of the left ventricle on stress images, which shows reversibility on resting images, suspicious for myocardial ischemia. (Summed difference score of 12).  Wall Motion: Septal hypokinesis noted. No evidence of left ventricular dilatation.  Left Ventricular Ejection Fraction: 61 %  End diastolic volume 47 ml  End systolic volume 19 ml  IMPRESSION: 1. Moderate reversible myocardial perfusion defect in the lateral and inferior left ventricular walls, suspicious for myocardial ischemia.  2. Septal hypokinesis noted. No left ventricular dilatation.  3. Left ventricular ejection fraction 61%  4. Non invasive risk stratification*: Intermediate  *2012 Appropriate Use Criteria for Coronary Revascularization Focused Update: J Am Coll Cardiol. [id number]. http://content.airportbarriers.com.aspx?articleid=1201161   Electronically Signed  By: Earle Gell M.D.  On: 12/28/2015 15:32  Image reviewed by Dr. Sallyanne Kuster felt no ischemia, but possible diaphragmatic attenuation instead.     Assessment/Plan: 1. Chest pain - felt to have no ischemia on Myoview after review from Dr. Sallyanne Kuster. Symptoms have resolved. Would focus on CV risk factor modification.   2. HTN - BP better at home. She will continue to monitor.   3. Allergic reaction - ?etiology - off ACE - resolved.  4. HLD - will discuss with her PCP  5. SVT - no complaints of palpitations.   Current medicines are reviewed with the patient today.  The patient does not have concerns regarding medicines other than what has been noted above.  The following changes have been made:  See above.  Labs/ tests ordered today include:   No orders of the defined types were placed in this encounter.      Disposition:   FU prn.   Patient is agreeable to this plan and will call if any problems develop in the interim.   Signed: Burtis Junes, RN, ANP-C 01/20/2016 1:57 PM  [facility] [address] [place], [place]  [address] Phone: [phone] Fax: [phone]

## 2016-01-21 ENCOUNTER — Telehealth: Payer: Self-pay

## 2016-01-21 NOTE — Telephone Encounter (Signed)
Ok to print out and put on my desk for signature. 

## 2016-01-21 NOTE — Telephone Encounter (Signed)
Pt left v/m requesting rx hydrocodone apap. Call when ready for pick up. Last printed # 60 0n 12/25/15. The hydrocodone on med list quantity is # 30. Last seen 02/12/15 but has been seen multiple times since then for hospital f/u and acute visits.

## 2016-01-22 ENCOUNTER — Other Ambulatory Visit: Payer: Self-pay

## 2016-01-22 MED ORDER — AMLODIPINE BESYLATE 10 MG PO TABS: 10.0000 mg | ORAL_TABLET | Freq: Every day | ORAL | Status: DC

## 2016-01-22 MED ORDER — HYDROCODONE-ACETAMINOPHEN 7.5-325 MG PO TABS: 2.0000 | ORAL_TABLET | ORAL | Status: DC | PRN

## 2016-01-22 NOTE — Telephone Encounter (Signed)
Pt left vm requesting to pick up rx before the weekend. Pt request cb.

## 2016-01-22 NOTE — Telephone Encounter (Signed)
Pt request refill on amlodipine to [facility] [place]. Pt last seen 01/12/16; refilled per protocol and pt voiced understanding.

## 2016-01-22 NOTE — Telephone Encounter (Signed)
Lm on pts vm and informed her Rx is available for pickup from the front desk 

## 2016-01-23 NOTE — Telephone Encounter (Signed)
Pt called back and said hydrocodone apap was only # 30 and pt usually gets # 60. Pt request cb next week to see if can bring back # 30 rx and get new rx for # 75.

## 2016-01-26 NOTE — Telephone Encounter (Signed)
Spoke to pt who states she has already taken Rx to pharmacy but may request refill in 15days.

## 2016-01-26 NOTE — Telephone Encounter (Signed)
Yes ok bring back rx and we can dispense additional amount at that time.

## 2016-01-29 DIAGNOSIS — M545 Low back pain: Secondary | ICD-10-CM | POA: Diagnosis not present

## 2016-02-04 ENCOUNTER — Other Ambulatory Visit: Payer: Self-pay

## 2016-02-04 MED ORDER — HYDROCODONE-ACETAMINOPHEN 7.5-325 MG PO TABS: 2.0000 | ORAL_TABLET | ORAL | Status: DC | PRN

## 2016-02-04 NOTE — Telephone Encounter (Signed)
Spoke to pt and informed her Rx is available for pickup from the front desk 

## 2016-02-04 NOTE — Telephone Encounter (Signed)
Pt called requesting to pick up Rx for additional quantity of her Norco...15 days supply was prescribed 01/22/16..the patient wants to pick up Friday if she can...please advise

## 2016-02-09 DIAGNOSIS — M5441 Lumbago with sciatica, right side: Secondary | ICD-10-CM | POA: Diagnosis not present

## 2016-02-12 ENCOUNTER — Other Ambulatory Visit: Payer: Self-pay | Admitting: *Deleted

## 2016-02-12 MED ORDER — ATENOLOL 25 MG PO TABS: 25.0000 mg | ORAL_TABLET | Freq: Every day | ORAL | Status: DC

## 2016-02-15 ENCOUNTER — Other Ambulatory Visit: Payer: Self-pay | Admitting: Family Medicine

## 2016-02-18 ENCOUNTER — Other Ambulatory Visit: Payer: Self-pay | Admitting: Family Medicine

## 2016-02-18 NOTE — Telephone Encounter (Signed)
Rx called in to requested pharmacy 

## 2016-02-18 NOTE — Telephone Encounter (Signed)
Last f/u 10/2015 

## 2016-02-20 ENCOUNTER — Other Ambulatory Visit: Payer: Self-pay

## 2016-02-20 NOTE — Telephone Encounter (Signed)
Pt left v/m requesting rx hydrocodone apap. Call when ready for pick up. Last printed # 30 on 02/04/16. Last seen 01/12/16. Pt is requesting to go back on # 60 for the month refill. Pt request to pick up on 02/23/16. I have not changed quantity until approved by Dr Deborra Medina.

## 2016-02-23 ENCOUNTER — Ambulatory Visit (INDEPENDENT_AMBULATORY_CARE_PROVIDER_SITE_OTHER): Payer: Medicare Other | Admitting: Internal Medicine

## 2016-02-23 ENCOUNTER — Encounter (INDEPENDENT_AMBULATORY_CARE_PROVIDER_SITE_OTHER): Payer: Self-pay

## 2016-02-23 ENCOUNTER — Encounter: Payer: Self-pay | Admitting: Internal Medicine

## 2016-02-23 VITALS — BP 126/70 | HR 80 | Ht 64.0 in | Wt 206.4 lb

## 2016-02-23 DIAGNOSIS — K648 Other hemorrhoids: Secondary | ICD-10-CM | POA: Diagnosis not present

## 2016-02-23 MED ORDER — HYDROCODONE-ACETAMINOPHEN 7.5-325 MG PO TABS: 2.0000 | ORAL_TABLET | ORAL | 0 refills | Status: DC | PRN

## 2016-02-23 NOTE — Telephone Encounter (Signed)
Lm on pts vm informing her Rx will be available for pickup after 0830 8/1.

## 2016-02-23 NOTE — Progress Notes (Signed)
Heather Shaw is a 75 year old female with a history of symptomatic internal hemorrhoids who is seen for hemorrhoidal banding. She was seen in the office on 6-17 we discussed banding at that time Symptoms are primarily that of prolapse often requiring manual reduction. Very rare bleeding. Very rare fecal smearing. Occasional hard stool but overall she is having 5-6 bowel movements per week. She rarely uses a laxative. Banding previously by Dr. Earlean Shawl many years ago which she found helpful   PROCEDURE NOTE:  The patient presents with symptomatic grade 2-3 internal hemorrhoids, requesting rubber band ligation of her hemorrhoidal disease.  All risks, benefits and alternative forms of therapy were described and informed consent was obtained.   The anorectum was pre-medicated with 0.125% nitroglycerin ointment The decision was made to band the LL internal hemorrhoid, and the [name] was used to perform band ligation without complication.   Digital anorectal examination was then performed to assure proper positioning of the band, and to adjust the banded tissue as required.  The patient was discharged home without pain or other issues.  Dietary and behavioral recommendations were given and along with follow-up instructions.     The patient will return as scheduled for follow-up and possible additional banding as required. No complications were encountered and the patient tolerated the procedure well.

## 2016-02-23 NOTE — Patient Instructions (Signed)

## 2016-02-27 DIAGNOSIS — M5416 Radiculopathy, lumbar region: Secondary | ICD-10-CM | POA: Diagnosis not present

## 2016-03-02 ENCOUNTER — Other Ambulatory Visit: Payer: Self-pay | Admitting: Physical Medicine and Rehabilitation

## 2016-03-02 DIAGNOSIS — G8929 Other chronic pain: Secondary | ICD-10-CM

## 2016-03-02 DIAGNOSIS — M545 Low back pain: Principal | ICD-10-CM

## 2016-03-08 DIAGNOSIS — M5416 Radiculopathy, lumbar region: Secondary | ICD-10-CM | POA: Diagnosis not present

## 2016-03-10 ENCOUNTER — Ambulatory Visit (INDEPENDENT_AMBULATORY_CARE_PROVIDER_SITE_OTHER): Payer: Medicare Other | Admitting: Internal Medicine

## 2016-03-10 ENCOUNTER — Encounter: Payer: Self-pay | Admitting: Internal Medicine

## 2016-03-10 VITALS — BP 124/68 | HR 60 | Ht 64.0 in | Wt 210.4 lb

## 2016-03-10 DIAGNOSIS — K648 Other hemorrhoids: Secondary | ICD-10-CM

## 2016-03-10 NOTE — Patient Instructions (Signed)
You have been scheduled for your 3rd hemorrhoidal banding on 04/26/16 @ 4:00 pm.  HEMORRHOID BANDING PROCEDURE    FOLLOW-UP CARE   1. The procedure you have had should have been relatively painless since the banding of the area involved does not have nerve endings and there is no pain sensation.  The rubber band cuts off the blood supply to the hemorrhoid and the band may fall off as soon as 48 hours after the banding (the band may occasionally be seen in the toilet bowl following a bowel movement). You may notice a temporary feeling of fullness in the rectum which should respond adequately to plain Tylenol or Motrin.  2. Following the banding, avoid strenuous exercise that evening and resume full activity the next day.  A sitz bath (soaking in a warm tub) or bidet is soothing, and can be useful for cleansing the area after bowel movements.     3. To avoid constipation, take two tablespoons of natural wheat bran, natural oat bran, flax, Benefiber or any over the counter fiber supplement and increase your water intake to 7-8 glasses daily.    4. Unless you have been prescribed anorectal medication, do not put anything inside your rectum for two weeks: No suppositories, enemas, fingers, etc.  5. Occasionally, you may have more bleeding than usual after the banding procedure.  This is often from the untreated hemorrhoids rather than the treated one.  Don't be concerned if there is a tablespoon or so of blood.  If there is more blood than this, lie flat with your bottom higher than your head and apply an ice pack to the area. If the bleeding does not stop within a half an hour or if you feel faint, call our office at [phone] or go to the emergency room.  6. Problems are not common; however, if there is a substantial amount of bleeding, severe pain, chills, fever or difficulty passing urine (very rare) or other problems, you should call us at [phone] or report to the nearest emergency  room.  7. Do not stay seated continuously for more than 2-3 hours for a day or two after the procedure.  Tighten your buttock muscles 10-15 times every two hours and take 10-15 deep breaths every 1-2 hours.  Do not spend more than a few minutes on the toilet if you cannot empty your bowel; instead re-visit the toilet at a later time.    If you are age 88 or older, your body mass index should be between 23-30. Your Body mass index is 36.11 kg/m. If this is out of the aforementioned range listed, please consider follow up with your Primary Care Provider.  If you are age 4 or younger, your body mass index should be between 19-25. Your Body mass index is 36.11 kg/m. If this is out of the aformentioned range listed, please consider follow up with your Primary Care Provider.

## 2016-03-10 NOTE — Progress Notes (Signed)
Heather Shaw is a 75 year old female with a history of symptomatic internal hemorrhoids presenting for hemorrhoidal banding, appointment #2. Internal hemorrhoids symptomatic, primarily with prolapse. Very rare bleeding. They rare fecal smearing. She did very well with her initial banding and feels the symptoms have improved with less prolapse. She is using MiraLAX 17 g 2-3 days per week which have soften stools nicely   PROCEDURE NOTE:  The patient presents with symptomatic grade 2-3 internal hemorrhoids, requesting rubber band ligation of her hemorrhoidal disease.  All risks, benefits and alternative forms of therapy were described and informed consent was obtained.   The anorectum was pre-medicated with 0.125% nitroglycerin ointment The decision was made to band the RA (LL at visit #1) internal hemorrhoid, and the [place] was used to perform band ligation without complication.   Digital anorectal examination was then performed to assure proper positioning of the band, and to adjust the banded tissue as required.  The patient was discharged home without pain or other issues.  Dietary and behavioral recommendations were given and along with follow-up instructions.     The following adjunctive treatments were recommended: Continue MiraLAX 17 g 2-3 days weekly  The patient will return as scheduled for  follow-up and possible additional banding as required. No complications were encountered and the patient tolerated the procedure well.

## 2016-03-11 ENCOUNTER — Other Ambulatory Visit: Payer: Medicare Other

## 2016-03-11 DIAGNOSIS — M65341 Trigger finger, right ring finger: Secondary | ICD-10-CM | POA: Diagnosis not present

## 2016-03-16 DIAGNOSIS — Z1231 Encounter for screening mammogram for malignant neoplasm of breast: Secondary | ICD-10-CM | POA: Diagnosis not present

## 2016-03-16 DIAGNOSIS — N958 Other specified menopausal and perimenopausal disorders: Secondary | ICD-10-CM | POA: Diagnosis not present

## 2016-03-18 ENCOUNTER — Other Ambulatory Visit: Payer: Self-pay | Admitting: Family Medicine

## 2016-03-18 NOTE — Telephone Encounter (Signed)
Last f/u 10/2015 

## 2016-03-19 NOTE — Telephone Encounter (Signed)
[id number] [place], [place]: [phone]

## 2016-03-23 ENCOUNTER — Other Ambulatory Visit: Payer: Self-pay

## 2016-03-23 MED ORDER — HYDROCODONE-ACETAMINOPHEN 7.5-325 MG PO TABS
2.0000 | ORAL_TABLET | ORAL | 0 refills | Status: DC | PRN
Start: 2016-03-23 — End: 2016-04-21

## 2016-03-23 NOTE — Telephone Encounter (Signed)
Pt left v/m requesting rx hydrocodone apap. Call when ready for pick up.last printed # 60 on 02/23/16. Last seen f/u 01/12/16. Pt is getting 1st back injection on 03/25/16 in the afternoon; pt will not be going out on 03/26/16 and pt request to pick up rx on 03/25/16 in early AM. Pt request cb.

## 2016-03-24 NOTE — Telephone Encounter (Signed)
Spoke to pt and informed her Rx is available for pickup from the front desk 

## 2016-03-25 DIAGNOSIS — M5416 Radiculopathy, lumbar region: Secondary | ICD-10-CM | POA: Diagnosis not present

## 2016-04-19 ENCOUNTER — Other Ambulatory Visit: Payer: Self-pay | Admitting: Family Medicine

## 2016-04-19 NOTE — Telephone Encounter (Signed)
Last filled 03/18/16 #60, last OV 01/12/16. Ok to refill?

## 2016-04-19 NOTE — Telephone Encounter (Signed)
[id number] [place], [place]

## 2016-04-21 ENCOUNTER — Other Ambulatory Visit: Payer: Self-pay

## 2016-04-21 MED ORDER — HYDROCODONE-ACETAMINOPHEN 7.5-325 MG PO TABS: 2.0000 | ORAL_TABLET | ORAL | 0 refills | Status: DC | PRN

## 2016-04-21 NOTE — Telephone Encounter (Signed)
Pt left v/m requesting rx hydrocodone apap. Call when ready for pick up. rx last printed # 60 on 03/23/16. Last seen 01/12/16.

## 2016-04-22 MED ORDER — HYDROCODONE-ACETAMINOPHEN 7.5-325 MG PO TABS: 2.0000 | ORAL_TABLET | ORAL | 0 refills | Status: DC | PRN

## 2016-04-22 NOTE — Addendum Note (Signed)
Addended by: Modena Nunnery on: 04/22/2016 08:41 AM   Modules accepted: Orders

## 2016-04-22 NOTE — Telephone Encounter (Signed)
Pt left /vm requesting cb about status of hydrocodone apap rx.

## 2016-04-22 NOTE — Telephone Encounter (Signed)
Lm on pts vm informing her Rx is available for pickup from the front desk 

## 2016-04-23 DIAGNOSIS — M5416 Radiculopathy, lumbar region: Secondary | ICD-10-CM | POA: Diagnosis not present

## 2016-04-26 ENCOUNTER — Telehealth: Payer: Self-pay | Admitting: Internal Medicine

## 2016-04-26 ENCOUNTER — Encounter: Payer: Medicare Other | Admitting: Internal Medicine

## 2016-04-26 NOTE — Telephone Encounter (Signed)
No charge. 

## 2016-04-29 DIAGNOSIS — M5416 Radiculopathy, lumbar region: Secondary | ICD-10-CM | POA: Diagnosis not present

## 2016-05-12 DIAGNOSIS — M5416 Radiculopathy, lumbar region: Secondary | ICD-10-CM | POA: Diagnosis not present

## 2016-05-18 ENCOUNTER — Other Ambulatory Visit: Payer: Self-pay | Admitting: Family Medicine

## 2016-05-18 NOTE — Telephone Encounter (Signed)
Last f/u 10/2015 

## 2016-05-19 NOTE — Telephone Encounter (Signed)
Rx called in to requested pharmacy 

## 2016-05-20 ENCOUNTER — Other Ambulatory Visit: Payer: Self-pay

## 2016-05-20 MED ORDER — HYDROCODONE-ACETAMINOPHEN 7.5-325 MG PO TABS: 2.0000 | ORAL_TABLET | ORAL | 0 refills | Status: DC | PRN

## 2016-05-20 NOTE — Telephone Encounter (Signed)
Pt left v/m requesting rx hydrocodone apap. Call when ready for pick up. Last printed #60 on 04/22/16. Last seen 01/12/16.

## 2016-05-24 MED ORDER — HYDROCODONE-ACETAMINOPHEN 7.5-325 MG PO TABS: 2.0000 | ORAL_TABLET | ORAL | 0 refills | Status: DC | PRN

## 2016-05-24 NOTE — Telephone Encounter (Signed)
Lm on pts vm and informed her Rx is available for pickup from the front desk 

## 2016-05-24 NOTE — Addendum Note (Signed)
Addended by: Modena Nunnery on: 05/24/2016 09:02 AM   Modules accepted: Orders

## 2016-06-18 ENCOUNTER — Other Ambulatory Visit: Payer: Self-pay | Admitting: Family Medicine

## 2016-06-21 NOTE — Telephone Encounter (Signed)
Last f/u 10/2015 

## 2016-06-22 ENCOUNTER — Encounter: Payer: Medicare Other | Admitting: Internal Medicine

## 2016-06-22 ENCOUNTER — Telehealth: Payer: Self-pay

## 2016-06-22 NOTE — Telephone Encounter (Signed)
Please print and ask Webb Silversmith to sign in my absence. Thank you

## 2016-06-22 NOTE — Telephone Encounter (Signed)
Rx called in to requested pharmacy 

## 2016-06-22 NOTE — Telephone Encounter (Signed)
Pt left vm on Triage Line: Needs a refill of Hydrocodone. Last written 05-24-16 #60 Last OV 01-12-16 Next OV 11-05-16

## 2016-06-23 ENCOUNTER — Ambulatory Visit (INDEPENDENT_AMBULATORY_CARE_PROVIDER_SITE_OTHER): Payer: Medicare Other

## 2016-06-23 DIAGNOSIS — Z23 Encounter for immunization: Secondary | ICD-10-CM | POA: Diagnosis not present

## 2016-06-23 MED ORDER — HYDROCODONE-ACETAMINOPHEN 7.5-325 MG PO TABS: 2.0000 | ORAL_TABLET | ORAL | 0 refills | Status: DC | PRN

## 2016-06-23 NOTE — Telephone Encounter (Signed)
Lm on pts vm and informed her Rx is available for pickup from the front desk 

## 2016-06-23 NOTE — Telephone Encounter (Signed)
Placed in Regina's box for signature

## 2016-06-23 NOTE — Telephone Encounter (Signed)
Done, placed in MYD box 

## 2016-07-07 DIAGNOSIS — M25561 Pain in right knee: Secondary | ICD-10-CM | POA: Diagnosis not present

## 2016-07-11 ENCOUNTER — Other Ambulatory Visit: Payer: Self-pay | Admitting: Family Medicine

## 2016-07-15 DIAGNOSIS — M25561 Pain in right knee: Secondary | ICD-10-CM | POA: Diagnosis not present

## 2016-07-16 ENCOUNTER — Encounter: Payer: Medicare Other | Admitting: Internal Medicine

## 2016-07-18 ENCOUNTER — Other Ambulatory Visit: Payer: Self-pay | Admitting: Family Medicine

## 2016-07-20 ENCOUNTER — Other Ambulatory Visit: Payer: Self-pay | Admitting: Family Medicine

## 2016-07-20 NOTE — Telephone Encounter (Signed)
Last filled 12-20-15 #60 Last OV 11-03-15 Next OV 11-05-16

## 2016-07-20 NOTE — Telephone Encounter (Signed)
Left refill on voice mail at pharmacy  

## 2016-07-21 ENCOUNTER — Other Ambulatory Visit: Payer: Self-pay

## 2016-07-21 MED ORDER — HYDROCODONE-ACETAMINOPHEN 7.5-325 MG PO TABS: 2.0000 | ORAL_TABLET | ORAL | 0 refills | Status: DC | PRN

## 2016-07-21 NOTE — Telephone Encounter (Signed)
Pt left v/m requesting rx hydrocodone apap. Call when ready for pick up. Last printed # 60 on 06/23/16. Pt last seen 01/12/16.

## 2016-07-21 NOTE — Telephone Encounter (Signed)
RX printed and signed and placed in MYD box 

## 2016-07-21 NOTE — Telephone Encounter (Signed)
Rx left in front office for pick up  Left message on voicemail

## 2016-07-30 DIAGNOSIS — N3010 Interstitial cystitis (chronic) without hematuria: Secondary | ICD-10-CM | POA: Diagnosis not present

## 2016-08-16 DIAGNOSIS — M25561 Pain in right knee: Secondary | ICD-10-CM | POA: Diagnosis not present

## 2016-08-18 ENCOUNTER — Other Ambulatory Visit: Payer: Self-pay | Admitting: Family Medicine

## 2016-08-18 DIAGNOSIS — M65341 Trigger finger, right ring finger: Secondary | ICD-10-CM | POA: Diagnosis not present

## 2016-08-18 NOTE — Telephone Encounter (Signed)
Last f/u 10/2015-CPE 

## 2016-08-19 ENCOUNTER — Telehealth: Payer: Self-pay

## 2016-08-19 ENCOUNTER — Other Ambulatory Visit: Payer: Self-pay | Admitting: Orthopedic Surgery

## 2016-08-19 ENCOUNTER — Encounter (HOSPITAL_BASED_OUTPATIENT_CLINIC_OR_DEPARTMENT_OTHER): Payer: Self-pay | Admitting: *Deleted

## 2016-08-19 NOTE — Telephone Encounter (Signed)
Pt left v/m requesting rx hydrocodone apap. Call when ready for pick up. Last printed # 60 on 07/21/16; last seen 01/12/16. Pt wants to pick up on 08/23/16.

## 2016-08-19 NOTE — Telephone Encounter (Signed)
Ok to print out and leave on my desk for signature. 

## 2016-08-19 NOTE — H&P (Signed)
Heather Shaw is an 76 y.o. female.   CC / Reason for Visit: Right ring finger triggering HPI: This patient returns to clinic today indicating that the right ring finger trigger has returned once again.  She indicates that at this time she is ready to proceed with right ring finger trigger release.    HPI 03/11/2016:This patient returns to clinic indicating that she has a return of the triggering in her right ring finger.  She reports that she would like to be considered for a final injection as she has some other medical problems and will not be able to go forth with a trigger finger release at this current time.  HPI 09/18/2015:This patient is here for a new problem today.  She indicates that she has had locking and catching in the right ring finger for approximately the last 2 months and that it is significantly worse in the mornings.  She states that her thumb is well without any locking or catching.  She would like an injection into her right ring finger today.  Past Medical History:  Diagnosis Date  . Arthritis   . Bladder pain   . Bradycardia   . DDD (degenerative disc disease), lumbosacral   . Hyperlipidemia   . Hyperplastic colon polyp   . Hypertension   . IC (interstitial cystitis)   . Internal hemorrhoids   . Polyp of rectum   . PONV (postoperative nausea and vomiting)   . Squamous papilloma    in esophageal polyp  . Uterine fibroid   . Vertigo   . Wears glasses   . Wears partial dentures     Past Surgical History:  Procedure Laterality Date  . COLONOSCOPY W/ POLYPECTOMY  2009  . CYSTO WITH HYDRODISTENSION N/A 05/20/2014   Procedure: CYSTOSCOPY/HYDRODISTENSION, INSTILLATION OF CHLORPACTIN;  Surgeon: Bernestine Amass, MD;  Location: [facility];  Service: Urology;  Laterality: N/A;  . CYSTO/  HYDRODISTENTION/  INSTILLATION CLORPACTIC  09-21-2010  . DILATION AND CURETTAGE OF UTERUS  1988  . KNEE ARTHROSCOPY Right 11-18-2009    Family History  Problem  Relation Age of Onset  . Clotting disorder Mother 11  . COPD Father   . Heart attack Father 81  . Emphysema Father   . Stroke Paternal Aunt   . Hypertension Sister   . Hypertension Brother    Social History:  reports that she quit smoking about 45 years ago. Her smoking use included Cigarettes. She has never used smokeless tobacco. She reports that she does not drink alcohol or use drugs.  Allergies:  Allergies  Allergen Reactions  . Contrast Media [Iodinated Diagnostic Agents]     ? angioedema  . Lisinopril     ? angioedema  . Allegra [Fexofenadine] Other (See Comments)    Makes pt nervous  . Codeine Nausea And Vomiting  . Prednisone     Causes elevation in blood pressure- ? All steroids cause same reaction  . Zithromax [Azithromycin] Other (See Comments)    Burns stomach  . Sulfa Antibiotics Rash    No prescriptions prior to admission.    No results found for this or any previous visit (from the past 48 hour(s)). No results found.  Review of Systems  All other systems reviewed and are negative.   There were no vitals taken for this visit. Physical Exam  Constitutional:  WD, WN, NAD HEENT:  NCAT, EOMI Neuro/Psych:  Alert & oriented to person, place, and time; appropriate mood & affect Lymphatic: No  generalized UE edema or lymphadenopathy Extremities / MSK:  Both UE are normal with respect to appearance, ranges of motion, joint stability, muscle strength/tone, sensation, & perfusion except as otherwise noted:  The right ring finger is painful over the A1 pulley with palpation.  There is palpable locking and catching.  There is full digital range of motion .NVI.  Labs / X-rays:  No radiographic studies obtained today.  Assessment: Right ring finger trigger- injections on 09/18/2015, 11/20/2015, 03/11/16  Plan:  The findings were discussed the patient.  Despite 3 injections into the right ring finger tendon sheath, conservative measures to treat right ring finger  triggering has failed.  The patient wishes to proceed with right ring finger trigger release.The details of the operative procedure were discussed with the patient.  Questions were invited and answered.  In addition to the goal of the procedure, the risks of the procedure to include but not limited to bleeding; infection; damage to the nerves or blood vessels that could result in bleeding, numbness, weakness, chronic pain, and the need for additional procedures; stiffness; the need for revision surgery; and anesthetic risks were reviewed.  No specific outcome was guaranteed or implied.  Informed consent was obtained.   Allannah Kempen A., MD 08/19/2016, 9:00 AM

## 2016-08-19 NOTE — Telephone Encounter (Signed)
Rx called in to requested pharmacy 

## 2016-08-20 ENCOUNTER — Encounter (HOSPITAL_BASED_OUTPATIENT_CLINIC_OR_DEPARTMENT_OTHER)
Admission: RE | Admit: 2016-08-20 | Discharge: 2016-08-20 | Disposition: A | Discharge status: Home / Self Care | Payer: Medicare Other | Source: Ambulatory Visit | Attending: Orthopedic Surgery | Admitting: Orthopedic Surgery

## 2016-08-20 DIAGNOSIS — Z6836 Body mass index (BMI) 36.0-36.9, adult: Secondary | ICD-10-CM | POA: Diagnosis not present

## 2016-08-20 DIAGNOSIS — M65341 Trigger finger, right ring finger: Secondary | ICD-10-CM | POA: Diagnosis not present

## 2016-08-20 DIAGNOSIS — Z888 Allergy status to other drugs, medicaments and biological substances status: Secondary | ICD-10-CM | POA: Diagnosis not present

## 2016-08-20 DIAGNOSIS — Z87891 Personal history of nicotine dependence: Secondary | ICD-10-CM | POA: Diagnosis not present

## 2016-08-20 DIAGNOSIS — M24541 Contracture, right hand: Secondary | ICD-10-CM | POA: Diagnosis not present

## 2016-08-20 DIAGNOSIS — Z881 Allergy status to other antibiotic agents status: Secondary | ICD-10-CM | POA: Diagnosis not present

## 2016-08-20 DIAGNOSIS — I10 Essential (primary) hypertension: Secondary | ICD-10-CM | POA: Diagnosis not present

## 2016-08-20 LAB — BASIC METABOLIC PANEL
ANION GAP: 8 (ref 5–15)
BUN: 12 mg/dL (ref 6–20)
CHLORIDE: 103 mmol/L (ref 101–111)
CO2: 26 mmol/L (ref 22–32)
Calcium: 9.2 mg/dL (ref 8.9–10.3)
Creatinine, Ser: 0.66 mg/dL (ref 0.44–1.00)
GFR calc Af Amer: >60 mL/min (ref >60)
GLUCOSE: 113 mg/dL — AB (ref 65–99)
POTASSIUM: 3.9 mmol/L (ref 3.5–5.1)
Sodium: 137 mmol/L (ref 135–145)

## 2016-08-23 ENCOUNTER — Encounter (HOSPITAL_BASED_OUTPATIENT_CLINIC_OR_DEPARTMENT_OTHER)
Admission: RE | Disposition: A | Discharge status: Home / Self Care | Payer: Self-pay | Source: Ambulatory Visit | Attending: Orthopedic Surgery

## 2016-08-23 ENCOUNTER — Encounter (HOSPITAL_BASED_OUTPATIENT_CLINIC_OR_DEPARTMENT_OTHER): Payer: Self-pay | Admitting: *Deleted

## 2016-08-23 ENCOUNTER — Ambulatory Visit (HOSPITAL_BASED_OUTPATIENT_CLINIC_OR_DEPARTMENT_OTHER): Payer: Medicare Other | Admitting: Anesthesiology

## 2016-08-23 ENCOUNTER — Ambulatory Visit (HOSPITAL_BASED_OUTPATIENT_CLINIC_OR_DEPARTMENT_OTHER)
Admission: RE | Admit: 2016-08-23 | Discharge: 2016-08-23 | Disposition: A | Discharge status: Home / Self Care | Payer: Medicare Other | Source: Ambulatory Visit | Attending: Orthopedic Surgery | Admitting: Orthopedic Surgery

## 2016-08-23 DIAGNOSIS — I10 Essential (primary) hypertension: Secondary | ICD-10-CM | POA: Diagnosis not present

## 2016-08-23 DIAGNOSIS — M24541 Contracture, right hand: Secondary | ICD-10-CM | POA: Insufficient documentation

## 2016-08-23 DIAGNOSIS — Z888 Allergy status to other drugs, medicaments and biological substances status: Secondary | ICD-10-CM | POA: Diagnosis not present

## 2016-08-23 DIAGNOSIS — Z6836 Body mass index (BMI) 36.0-36.9, adult: Secondary | ICD-10-CM | POA: Insufficient documentation

## 2016-08-23 DIAGNOSIS — Z881 Allergy status to other antibiotic agents status: Secondary | ICD-10-CM | POA: Diagnosis not present

## 2016-08-23 DIAGNOSIS — Z87891 Personal history of nicotine dependence: Secondary | ICD-10-CM | POA: Diagnosis not present

## 2016-08-23 DIAGNOSIS — M65341 Trigger finger, right ring finger: Secondary | ICD-10-CM | POA: Diagnosis not present

## 2016-08-23 DIAGNOSIS — I471 Supraventricular tachycardia: Secondary | ICD-10-CM | POA: Diagnosis not present

## 2016-08-23 DIAGNOSIS — G4700 Insomnia, unspecified: Secondary | ICD-10-CM | POA: Diagnosis not present

## 2016-08-23 DIAGNOSIS — E785 Hyperlipidemia, unspecified: Secondary | ICD-10-CM | POA: Diagnosis not present

## 2016-08-23 HISTORY — PX: TRIGGER FINGER RELEASE: SHX641

## 2016-08-23 SURGERY — RELEASE, A1 PULLEY, FOR TRIGGER FINGER
Anesthesia: Monitor Anesthesia Care | Site: Hand | Laterality: Right

## 2016-08-23 MED ORDER — LIDOCAINE 2% (20 MG/ML) 5 ML SYRINGE
INTRAMUSCULAR | Status: AC
  Filled 2016-08-23: qty 5

## 2016-08-23 MED ORDER — MIDAZOLAM HCL 2 MG/2ML IJ SOLN
1.0000 mg | INTRAMUSCULAR | Status: DC | PRN
  Administered 2016-08-23: 1 mg via INTRAVENOUS

## 2016-08-23 MED ORDER — SUCCINYLCHOLINE CHLORIDE 200 MG/10ML IV SOSY
PREFILLED_SYRINGE | INTRAVENOUS | Status: AC
  Filled 2016-08-23: qty 10

## 2016-08-23 MED ORDER — ONDANSETRON HCL 4 MG/2ML IJ SOLN
INTRAMUSCULAR | Status: DC | PRN
  Administered 2016-08-23: 4 mg via INTRAVENOUS

## 2016-08-23 MED ORDER — LIDOCAINE HCL 2 % IJ SOLN
INTRAMUSCULAR | Status: DC | PRN
  Administered 2016-08-23: 5 mL

## 2016-08-23 MED ORDER — CEFAZOLIN SODIUM-DEXTROSE 2-4 GM/100ML-% IV SOLN
2.0000 g | INTRAVENOUS | Status: AC
  Administered 2016-08-23: 2 g via INTRAVENOUS

## 2016-08-23 MED ORDER — SCOPOLAMINE 1 MG/3DAYS TD PT72: 1.0000 | MEDICATED_PATCH | Freq: Once | TRANSDERMAL | Status: DC | PRN

## 2016-08-23 MED ORDER — MEPERIDINE HCL 25 MG/ML IJ SOLN
6.2500 mg | INTRAMUSCULAR | Status: DC | PRN
Start: 2016-08-23 — End: 2016-08-23

## 2016-08-23 MED ORDER — ONDANSETRON HCL 4 MG/2ML IJ SOLN
INTRAMUSCULAR | Status: AC
  Filled 2016-08-23: qty 2

## 2016-08-23 MED ORDER — FENTANYL CITRATE (PF) 100 MCG/2ML IJ SOLN: 25.0000 ug | INTRAMUSCULAR | Status: DC | PRN

## 2016-08-23 MED ORDER — EPHEDRINE 5 MG/ML INJ
INTRAVENOUS | Status: AC
  Filled 2016-08-23: qty 10

## 2016-08-23 MED ORDER — BUPIVACAINE-EPINEPHRINE 0.5% -1:200000 IJ SOLN
INTRAMUSCULAR | Status: DC | PRN
  Administered 2016-08-23: 5 mL

## 2016-08-23 MED ORDER — MIDAZOLAM HCL 2 MG/2ML IJ SOLN
0.5000 mg | Freq: Once | INTRAMUSCULAR | Status: DC | PRN
Start: 2016-08-23 — End: 2016-08-23

## 2016-08-23 MED ORDER — LIDOCAINE HCL (CARDIAC) 20 MG/ML IV SOLN
INTRAVENOUS | Status: DC | PRN
  Administered 2016-08-23: 25 mg via INTRAVENOUS

## 2016-08-23 MED ORDER — PROMETHAZINE HCL 25 MG/ML IJ SOLN: 6.2500 mg | INTRAMUSCULAR | Status: DC | PRN

## 2016-08-23 MED ORDER — LACTATED RINGERS IV SOLN
INTRAVENOUS | Status: DC
  Administered 2016-08-23: 07:00:00 via INTRAVENOUS

## 2016-08-23 MED ORDER — PROPOFOL 10 MG/ML IV BOLUS
INTRAVENOUS | Status: DC | PRN
  Administered 2016-08-23 (×2): 20 mg via INTRAVENOUS

## 2016-08-23 MED ORDER — CEFAZOLIN SODIUM-DEXTROSE 2-4 GM/100ML-% IV SOLN
INTRAVENOUS | Status: AC
  Filled 2016-08-23: qty 100

## 2016-08-23 MED ORDER — MIDAZOLAM HCL 2 MG/2ML IJ SOLN
INTRAMUSCULAR | Status: AC
  Filled 2016-08-23: qty 2

## 2016-08-23 MED ORDER — FENTANYL CITRATE (PF) 100 MCG/2ML IJ SOLN
50.0000 ug | INTRAMUSCULAR | Status: DC | PRN
  Administered 2016-08-23: 50 ug via INTRAVENOUS

## 2016-08-23 MED ORDER — FENTANYL CITRATE (PF) 100 MCG/2ML IJ SOLN
INTRAMUSCULAR | Status: AC
  Filled 2016-08-23: qty 2

## 2016-08-23 MED ORDER — HYDROCODONE-ACETAMINOPHEN 7.5-325 MG PO TABS: 2.0000 | ORAL_TABLET | ORAL | 0 refills | Status: DC | PRN

## 2016-08-23 MED ORDER — PHENYLEPHRINE 40 MCG/ML (10ML) SYRINGE FOR IV PUSH (FOR BLOOD PRESSURE SUPPORT)
PREFILLED_SYRINGE | INTRAVENOUS | Status: AC
  Filled 2016-08-23: qty 10

## 2016-08-23 MED ORDER — PROPOFOL 500 MG/50ML IV EMUL
INTRAVENOUS | Status: AC
  Filled 2016-08-23: qty 50

## 2016-08-23 SURGICAL SUPPLY — 38 items
BLADE SURG 15 STRL LF DISP TIS (BLADE) ×1 IMPLANT
BLADE SURG 15 STRL SS (BLADE) ×3
BNDG CMPR 9X4 STRL LF SNTH (GAUZE/BANDAGES/DRESSINGS) ×1
BNDG COHESIVE 1X5 TAN STRL LF (GAUZE/BANDAGES/DRESSINGS) ×3 IMPLANT
BNDG CONFORM 2 STRL LF (GAUZE/BANDAGES/DRESSINGS) ×3 IMPLANT
BNDG ESMARK 4X9 LF (GAUZE/BANDAGES/DRESSINGS) ×3 IMPLANT
BNDG GAUZE ELAST 4 BULKY (GAUZE/BANDAGES/DRESSINGS) ×3 IMPLANT
CHLORAPREP W/TINT 26ML (MISCELLANEOUS) ×3 IMPLANT
COVER BACK TABLE 60X90IN (DRAPES) ×3 IMPLANT
COVER MAYO STAND STRL (DRAPES) ×3 IMPLANT
CUFF TOURNIQUET SINGLE 18IN (TOURNIQUET CUFF) ×2 IMPLANT
DRAPE EXTREMITY T 121X128X90 (DRAPE) ×3 IMPLANT
DRAPE SURG 17X23 STRL (DRAPES) ×3 IMPLANT
DRSG EMULSION OIL 3X3 NADH (GAUZE/BANDAGES/DRESSINGS) ×3 IMPLANT
GLOVE BIO SURGEON STRL SZ7.5 (GLOVE) ×3 IMPLANT
GLOVE BIOGEL PI IND STRL 7.0 (GLOVE) ×1 IMPLANT
GLOVE BIOGEL PI IND STRL 8 (GLOVE) ×1 IMPLANT
GLOVE BIOGEL PI INDICATOR 7.0 (GLOVE) ×2
GLOVE BIOGEL PI INDICATOR 8 (GLOVE) ×2
GLOVE ECLIPSE 6.5 STRL STRAW (GLOVE) ×3 IMPLANT
GOWN STRL REUS W/ TWL LRG LVL3 (GOWN DISPOSABLE) ×2 IMPLANT
GOWN STRL REUS W/TWL LRG LVL3 (GOWN DISPOSABLE) ×6
GOWN STRL REUS W/TWL XL LVL3 (GOWN DISPOSABLE) ×3 IMPLANT
NDL HYPO 25X1 1.5 SAFETY (NEEDLE) IMPLANT
NDL SAFETY ECLIPSE 18X1.5 (NEEDLE) IMPLANT
NEEDLE HYPO 18GX1.5 SHARP (NEEDLE) ×3
NEEDLE HYPO 25X1 1.5 SAFETY (NEEDLE) ×3 IMPLANT
NS IRRIG 1000ML POUR BTL (IV SOLUTION) ×3 IMPLANT
PACK BASIN DAY SURGERY FS (CUSTOM PROCEDURE TRAY) ×3 IMPLANT
SPONGE GAUZE 4X4 12PLY STER LF (GAUZE/BANDAGES/DRESSINGS) ×3 IMPLANT
STOCKINETTE 6  STRL (DRAPES) ×2
STOCKINETTE 6 STRL (DRAPES) ×1 IMPLANT
SUT VICRYL RAPIDE 4/0 PS 2 (SUTURE) ×2 IMPLANT
SYR 10ML LL (SYRINGE) ×2 IMPLANT
SYR BULB 3OZ (MISCELLANEOUS) ×3 IMPLANT
TOWEL OR 17X24 6PK STRL BLUE (TOWEL DISPOSABLE) ×3 IMPLANT
TOWEL OR NON WOVEN STRL DISP B (DISPOSABLE) IMPLANT
UNDERPAD 30X30 (UNDERPADS AND DIAPERS) ×3 IMPLANT

## 2016-08-23 NOTE — Telephone Encounter (Signed)
Pt advised Rx is available for pickup from the front desk; advised third party unable to pickup

## 2016-08-23 NOTE — Anesthesia Preprocedure Evaluation (Signed)
Anesthesia Evaluation  Patient identified by MRN, date of birth, ID band Patient awake    Reviewed: Allergy & Precautions, NPO status , Patient's Chart, lab work & pertinent test results, reviewed documented beta blocker date and time   History of Anesthesia Complications (+) PONV  Airway Mallampati: II  TM Distance: >3 FB Neck ROM: Full    Dental  (+) Dental Advisory Given, Partial Lower, Partial Upper   Pulmonary former smoker,    breath sounds clear to auscultation       Cardiovascular hypertension, Pt. on medications and Pt. on home beta blockers  Rhythm:Regular Rate:Normal  '17 ECHO:  EF 60-65%, valves OK   Neuro/Psych negative neurological ROS     GI/Hepatic negative GI ROS, Neg liver ROS,   Endo/Other  Morbid obesity  Renal/GU negative Renal ROS     Musculoskeletal  (+) Arthritis , Osteoarthritis,    Abdominal (+) + obese,   Peds  Hematology negative hematology ROS (+)   Anesthesia Other Findings   Reproductive/Obstetrics                             Anesthesia Physical Anesthesia Plan  ASA: II  Anesthesia Plan: MAC   Post-op Pain Management:    Induction:   Airway Management Planned: Natural Airway and Simple Face Mask  Additional Equipment:   Intra-op Plan:   Post-operative Plan:   Informed Consent: I have reviewed the patients History and Physical, chart, labs and discussed the procedure including the risks, benefits and alternatives for the proposed anesthesia with the patient or authorized representative who has indicated his/her understanding and acceptance.   Dental advisory given  Plan Discussed with: CRNA and Surgeon  Anesthesia Plan Comments: (Plan routine monitors, MAC)        Anesthesia Quick Evaluation

## 2016-08-23 NOTE — Interval H&P Note (Signed)
History and Physical Interval Note:  08/23/2016 7:42 AM  Heather Shaw  has presented today for surgery, with the diagnosis of RIGHT RING FINGER TRIGGER DIGIT M65.341  The various methods of treatment have been discussed with the patient and family. After consideration of risks, benefits and other options for treatment, the patient has consented to  Procedure(s): RIGHT RING FINGER TRIGGER RELEASE (Right) as a surgical intervention .  The patient's history has been reviewed, patient examined, no change in status, stable for surgery.  I have reviewed the patient's chart and labs.  Questions were answered to the patient's satisfaction.     Sherylann Vangorden A.

## 2016-08-23 NOTE — Transfer of Care (Signed)
Immediate Anesthesia Transfer of Care Note  Patient: Heather Shaw  Procedure(s) Performed: Procedure(s): RIGHT RING FINGER TRIGGER RELEASE (Right)  Patient Location: PACU  Anesthesia Type:MAC  Level of Consciousness: awake, alert  and oriented  Airway & Oxygen Therapy: Patient Spontanous Breathing and Patient connected to face mask oxygen  Post-op Assessment: Report given to RN and Post -op Vital signs reviewed and stable  Post vital signs: Reviewed and stable  Last Vitals:  Vitals:   08/23/16 0644  BP: (!) 134/56  Pulse: (!) 52  Resp: 18  Temp: 36.9 C    Last Pain:  Vitals:   08/23/16 0644  TempSrc: Oral         Complications: No apparent anesthesia complications

## 2016-08-23 NOTE — Discharge Instructions (Addendum)
Discharge Instructions   You have a light dressing on your hand.  You may begin gentle motion of your fingers and hand immediately, but you should not do any heavy lifting or gripping.  Elevate your hand to reduce pain & swelling of the digits.  Ice over the operative site may be helpful to reduce pain & swelling.  DO NOT USE HEAT. Leave the dressing in place until the third day after your surgery and then remove it, leaving it open to air.  After the bandage has been removed you may shower, regularly washing the incision and letting the water run over it, but not submerging it (no swimming, soaking it in dishwater, etc.) You may drive a car when you are off of prescription pain medications and can safely control your vehicle with both hands. We will address whether therapy will be required or not when you return to the office. You may have already made your follow-up appointment when we completed your preop visit.  If not, please call our office today or the next business day to make your return appointment for 10-15 days after surgery.   Please call [phone] during normal business hours or [phone] after hours for any problems. Including the following:  - excessive redness of the incisions - drainage for more than 4 days - fever of more than 101.5 F  *Please note that pain medications will not be refilled after hours or on weekends.    Post Anesthesia Home Care Instructions  Activity: Get plenty of rest for the remainder of the day. A responsible adult should stay with you for 24 hours following the procedure.  For the next 24 hours, DO NOT: -Drive a car -Paediatric nurse -Drink alcoholic beverages -Take any medication unless instructed by your physician -Make any legal decisions or sign important papers.  Meals: Start with liquid foods such as gelatin or soup. Progress to regular foods as tolerated. Avoid greasy, spicy, heavy foods. If nausea and/or vomiting occur, drink  only clear liquids until the nausea and/or vomiting subsides. Call your physician if vomiting continues.  Special Instructions/Symptoms: Your throat may feel dry or sore from the anesthesia or the breathing tube placed in your throat during surgery. If this causes discomfort, gargle with warm salt water. The discomfort should disappear within 24 hours.  If you had a scopolamine patch placed behind your ear for the management of post- operative nausea and/or vomiting:  1. The medication in the patch is effective for 72 hours, after which it should be removed.  Wrap patch in a tissue and discard in the trash. Wash hands thoroughly with soap and water. 2. You may remove the patch earlier than 72 hours if you experience unpleasant side effects which may include dry mouth, dizziness or visual disturbances. 3. Avoid touching the patch. Wash your hands with soap and water after contact with the patch.

## 2016-08-23 NOTE — Anesthesia Postprocedure Evaluation (Addendum)
Anesthesia Post Note  Patient: Heather Shaw  Procedure(s) Performed: Procedure(s) (LRB): RIGHT RING FINGER TRIGGER RELEASE (Right)  Patient location during evaluation: PACU Anesthesia Type: MAC Level of consciousness: awake and alert, patient cooperative and oriented Pain management: pain level controlled Vital Signs Assessment: post-procedure vital signs reviewed and stable Respiratory status: spontaneous breathing, nonlabored ventilation and respiratory function stable Cardiovascular status: blood pressure returned to baseline and stable Postop Assessment: no signs of nausea or vomiting Anesthetic complications: no       Last Vitals:  Vitals:   08/23/16 0830 08/23/16 0900  BP: 129/77 (!) 137/58  Pulse: (!) 52 (!) 54  Resp: 12 16  Temp:  36.5 C    Last Pain:  Vitals:   08/23/16 0900  TempSrc:   PainSc: 0-No pain                 Ivry Pigue,E. Aubriauna Riner

## 2016-08-23 NOTE — Telephone Encounter (Signed)
Pt left v/m requesting cb about request for hydrocodone apap.

## 2016-08-23 NOTE — Op Note (Signed)
08/23/2016  7:42 AM  PATIENT:  Heather Shaw  76 y.o. female  PRE-OPERATIVE DIAGNOSIS:  Right ring finger trigger finger with PIP contracture  POST-OPERATIVE DIAGNOSIS:  Same  PROCEDURE:  Right ring finger trigger release  SURGEON: Rayvon Char. Grandville Silos, MD  PHYSICIAN ASSISTANT: Morley Kos, OPA-C  ANESTHESIA:  local and MAC  SPECIMENS:  None  DRAINS:   None  EBL:  less than 50 mL  PREOPERATIVE INDICATIONS:  Heather Shaw is a  76 y.o. female with long established right ring finger stenosing tenosynovitis, refractory to nonoperative management established PIP flexion contracture.  The risks benefits and alternatives were discussed with the patient preoperatively including but not limited to the risks of infection, bleeding, nerve injury, cardiopulmonary complications, the need for revision surgery, among others, and the patient verbalized understanding and consented to proceed.  OPERATIVE IMPLANTS: None  OPERATIVE PROCEDURE:  After receiving prophylactic antibiotics, the patient was escorted to the operative theatre and placed in a supine position.  Digital block was performed with a mixture of lidocaine and Marcaine, bearing epinephrine.  A surgical "time-out" was performed during which the planned procedure, proposed operative site, and the correct patient identity were compared to the operative consent and agreement confirmed by the circulating nurse according to current facility policy.  Following application of a tourniquet to the operative extremity, the exposed skin was prepped with Chloraprep and draped in the usual sterile fashion.  The limb was exsanguinated with an Esmarch bandage and the tourniquet inflated to approximately 157mHg higher than systolic BP.  An oblique incision was made over the A1 pulley of the affected digit.  Subcutaneous taste tissues were dissected with blunt and spreading dissection to reveal an underlying flexor tendon sheath and A1 pulley.   With the neurovascular structures protected, the A1 pulley was split in the midline under direct visualization with loupe assistance.  Some crossing bands proximal to the A1 pulley were also released.  The tendons were pulled into view, cleaned of thickened synovium and return to their bed.  The wound was irrigated, tourniquet released, and skin closed with 4-0 Vicryl Rapide.  A light dressing was applied and the patient was taken to the recovery room.  DISPOSITION: The patient will be discharged home today with typical instructions, returning in 10-15 days.

## 2016-08-24 ENCOUNTER — Encounter (HOSPITAL_BASED_OUTPATIENT_CLINIC_OR_DEPARTMENT_OTHER): Payer: Self-pay | Admitting: Orthopedic Surgery

## 2016-09-01 DIAGNOSIS — M65341 Trigger finger, right ring finger: Secondary | ICD-10-CM | POA: Diagnosis not present

## 2016-09-08 ENCOUNTER — Encounter: Payer: Medicare Other | Admitting: Internal Medicine

## 2016-09-08 DIAGNOSIS — M5416 Radiculopathy, lumbar region: Secondary | ICD-10-CM | POA: Diagnosis not present

## 2016-09-09 ENCOUNTER — Encounter: Payer: Self-pay | Admitting: Family Medicine

## 2016-09-09 ENCOUNTER — Ambulatory Visit (INDEPENDENT_AMBULATORY_CARE_PROVIDER_SITE_OTHER): Payer: Medicare Other | Admitting: Family Medicine

## 2016-09-09 DIAGNOSIS — H6122 Impacted cerumen, left ear: Secondary | ICD-10-CM | POA: Diagnosis not present

## 2016-09-09 DIAGNOSIS — H6120 Impacted cerumen, unspecified ear: Secondary | ICD-10-CM | POA: Insufficient documentation

## 2016-09-09 NOTE — Progress Notes (Signed)
Subjective:   Patient ID: Heather Shaw, female    DOB: 1941-02-01, 76 y.o.   MRN: [id number]  Heather Shaw is a pleasant 76 y.o. year old female who presents to clinic today with Ear Fullness  on 09/09/2016  HPI:  Left ear fullness- has been experiencing worsening pressure in her left ear.  Tried to use OTC debrox without improvement of symptoms.  Having some hearing loss on the left side since this started.  No other URI symptoms. Current Outpatient Prescriptions on File Prior to Visit  Medication Sig Dispense Refill  . amLODipine (NORVASC) 10 MG tablet TAKE 1 TABLET BY MOUTH EVERY DAY 90 tablet 1  . atenolol (TENORMIN) 25 MG tablet Take 1 tablet (25 mg total) by mouth daily. 90 tablet 3  . fluocinonide-emollient (LIDEX-E) 0.05 % cream Apply 1 application topically 2 (two) times daily. 30 g 0  . hydrochlorothiazide (HYDRODIURIL) 25 MG tablet TAKE 1 TABLET BY MOUTH EVERY DAY 90 tablet 1  . HYDROcodone-acetaminophen (NORCO) 7.5-325 MG tablet Take 2 tablets by mouth as needed for moderate pain (knee pain). 60 tablet 0  . magnesium hydroxide (MILK OF MAGNESIA) 400 MG/5ML suspension Take by mouth daily as needed for mild constipation.    . meclizine (ANTIVERT) 12.5 MG tablet Take 1 tablet (12.5 mg total) by mouth 3 (three) times daily as needed for dizziness. 30 tablet 0  . polyethylene glycol powder (GLYCOLAX/MIRALAX) powder Dissolve 17 grams in at least 8 ounces water/juice and drink once daily 527 g 3  . traMADol (ULTRAM) 50 MG tablet TAKE 1 TABLET BY MOUTH EVERY 6 TO 8 HOURS AS NEEDED FOR PAIN 60 tablet 0   No current facility-administered medications on file prior to visit.     Allergies  Allergen Reactions  . Contrast Media [Iodinated Diagnostic Agents]     ? angioedema  . Lisinopril     ? angioedema  . Allegra [Fexofenadine] Other (See Comments)    Makes pt nervous  . Codeine Nausea And Vomiting  . Prednisone     Causes elevation in blood pressure- ? All steroids  cause same reaction  . Zithromax [Azithromycin] Other (See Comments)    Burns stomach  . Sulfa Antibiotics Rash    Past Medical History:  Diagnosis Date  . Arthritis   . Bladder pain   . Bradycardia   . DDD (degenerative disc disease), lumbosacral   . Hyperlipidemia   . Hyperplastic colon polyp   . Hypertension   . IC (interstitial cystitis)   . Internal hemorrhoids   . Polyp of rectum   . PONV (postoperative nausea and vomiting)   . Squamous papilloma    in esophageal polyp  . Uterine fibroid   . Vertigo   . Wears glasses   . Wears partial dentures     Past Surgical History:  Procedure Laterality Date  . COLONOSCOPY W/ POLYPECTOMY  2009  . CYSTO WITH HYDRODISTENSION N/A 05/20/2014   Procedure: CYSTOSCOPY/HYDRODISTENSION, INSTILLATION OF CHLORPACTIN;  Surgeon: Bernestine Amass, MD;  Location: [facility];  Service: Urology;  Laterality: N/A;  . CYSTO/  HYDRODISTENTION/  INSTILLATION CLORPACTIC  09-21-2010  . DILATION AND CURETTAGE OF UTERUS  1988  . KNEE ARTHROSCOPY Right 11-18-2009  . TRIGGER FINGER RELEASE Right 08/23/2016   Procedure: RIGHT RING FINGER TRIGGER RELEASE;  Surgeon: Milly Jakob, MD;  Location: [place];  Service: Orthopedics;  Laterality: Right;    Family History  Problem Relation Age of Onset  .  Clotting disorder Mother 52  . COPD Father   . Heart attack Father 45  . Emphysema Father   . Stroke Paternal Aunt   . Hypertension Sister   . Hypertension Brother     Social History   Social History  . Marital status: Married    Spouse name: N/A  . Number of children: 6  . Years of education: N/A   Occupational History  . housewife    Social History Main Topics  . Smoking status: Former Smoker    Types: Cigarettes    Quit date: 05/17/1971  . Smokeless tobacco: Never Used  . Alcohol use No  . Drug use: No  . Sexual activity: No   Other Topics Concern  . Not on file   Social History Narrative  . No  narrative on file   The PMH, PSH, Social History, Family History, Medications, and allergies have been reviewed in [facility], and have been updated if relevant.   Review of Systems  Constitutional: Negative.   HENT: Positive for ear pain. Negative for sinus pressure, sneezing, sore throat, tinnitus, trouble swallowing and voice change.   Respiratory: Negative.   Cardiovascular: Negative.   Hematological: Negative.   Psychiatric/Behavioral: Negative.   All other systems reviewed and are negative.      Objective:    BP (!) 156/82   Pulse (!) 54   Temp 98.5 F (36.9 C) (Oral)   Wt 217 lb (98.4 kg)   SpO2 97%   BMI 36.67 kg/m    Physical Exam  Constitutional: She is oriented to person, place, and time. She appears well-developed and well-nourished. No distress.  HENT:  Head: Normocephalic and atraumatic.  Right Ear: Hearing and tympanic membrane normal.  Left ear-  Ceruminosis is noted.  Wax is removed by syringing and manual debridement.  Eyes: Conjunctivae are normal.  Cardiovascular: Normal rate.   Pulmonary/Chest: Effort normal.  Musculoskeletal: Normal range of motion.  Neurological: She is alert and oriented to person, place, and time. No cranial nerve deficit.  Skin: Skin is warm and dry. She is not diaphoretic.  Psychiatric: She has a normal mood and affect. Her behavior is normal. Judgment and thought content normal.  Nursing note and vitals reviewed.         Assessment & Plan:   Impacted cerumen of left ear No Follow-up on file.

## 2016-09-09 NOTE — Patient Instructions (Signed)
Earwax Buildup Your ears make a substance called earwax. It may also be called cerumen. Sometimes, too much earwax builds up in your ear canal. This can cause ear pain and make it harder for you to hear. CAUSES This condition is caused by too much earwax production or buildup. RISK FACTORS The following factors may make you more likely to develop this condition:  Cleaning your ears often with swabs.  Having narrow ear canals.  Having earwax that is overly thick or sticky.  Having eczema.  Being dehydrated. SYMPTOMS Symptoms of this condition include:  Reduced hearing.  Ear drainage.  Ear pain.  Ear itch.  A feeling of fullness in the ear or feeling that the ear is plugged.  Ringing in the ear.  Coughing. DIAGNOSIS Your health care provider can diagnose this condition based on your symptoms and medical history. Your health care provider will also do an ear exam to look inside your ear with a scope (otoscope). You may also have a hearing test. TREATMENT Treatment for this condition includes:  Over-the-counter or prescription ear drops to soften the earwax.  Earwax removal by a health care provider. This may be done:  By flushing the ear with body-temperature water.  With a medical instrument that has a loop at the end (earwax curette).  With a suction device. HOME CARE INSTRUCTIONS  Take over-the-counter and prescription medicines only as told by your health care provider.  Do not put any objects, including an ear swab, into your ear. You can clean the opening of your ear canal with a washcloth.  Drink enough water to keep your urine clear or pale yellow.  If you have frequent earwax buildup or you use hearing aids, consider seeing your health care provider every 6-12 months for routine preventive ear cleanings. Keep all follow-up visits as told by your health care provider. SEEK MEDICAL CARE IF:  You have ear pain.  Your condition does not improve with  treatment.  You have hearing loss.  You have blood, pus, or other fluid coming from your ear. This information is not intended to replace advice given to you by your health care provider. Make sure you discuss any questions you have with your health care provider. Document Released: 08/19/2004 Document Revised: 11/03/2015 Document Reviewed: 02/26/2015 Elsevier Interactive Patient Education  2017 [employer]  

## 2016-09-09 NOTE — Progress Notes (Signed)
Pre visit review using our clinic review tool, if applicable. No additional management support is needed unless otherwise documented below in the visit note. 

## 2016-09-09 NOTE — Assessment & Plan Note (Signed)
Ceruminosis is noted in left ear.  Wax is removed by syringing and manual debridement. Instructions for home care to prevent wax buildup are given.

## 2016-09-17 ENCOUNTER — Other Ambulatory Visit: Payer: Self-pay | Admitting: Family Medicine

## 2016-09-17 NOTE — Telephone Encounter (Signed)
Left refill on voice mail at pharmacy  

## 2016-09-17 NOTE — Telephone Encounter (Signed)
Last filled 08-19-16 #60 Last OV 09-09-16 Acute Next OV 11-05-16

## 2016-09-21 ENCOUNTER — Other Ambulatory Visit: Payer: Self-pay

## 2016-09-21 NOTE — Telephone Encounter (Signed)
Pt left v/m requesting rx hydrocodone apap. Call when ready for pick up. rx last printed # 60 on 08/23/16; last seen 01/12/16.

## 2016-09-21 NOTE — Telephone Encounter (Signed)
Ok to print out and place on my desk for signature. 

## 2016-09-22 MED ORDER — HYDROCODONE-ACETAMINOPHEN 7.5-325 MG PO TABS: 2.0000 | ORAL_TABLET | ORAL | 0 refills | Status: DC | PRN

## 2016-09-23 ENCOUNTER — Other Ambulatory Visit: Payer: Self-pay

## 2016-09-23 DIAGNOSIS — M5416 Radiculopathy, lumbar region: Secondary | ICD-10-CM | POA: Diagnosis not present

## 2016-09-23 MED ORDER — HYDROCODONE-ACETAMINOPHEN 7.5-325 MG PO TABS: 1.0000 | ORAL_TABLET | Freq: Two times a day (BID) | ORAL | 0 refills | Status: DC | PRN

## 2016-09-23 MED ORDER — HYDROCODONE-ACETAMINOPHEN 7.5-325 MG PO TABS: 2.0000 | ORAL_TABLET | ORAL | 0 refills | Status: DC | PRN

## 2016-09-23 NOTE — Telephone Encounter (Signed)
Rx for hydrocodone  Edited to 1 tab BID Prn. Rx  Given to Dr Deborra Medina for signature.Rx signed and placed at front desk. Left message for patient Rx ready for pick up

## 2016-09-23 NOTE — Addendum Note (Signed)
Addended by: Leota Jacobsen on: 09/23/2016 01:36 PM   Modules accepted: Orders

## 2016-09-23 NOTE — Telephone Encounter (Signed)
Called patient to let her know Rx ready for pick up left message on voice mail. Rx placed at front desk.

## 2016-09-23 NOTE — Telephone Encounter (Signed)
Pt is going to start to go to [place] and pt was advised by pharmacist needs new rx for Hydrocodone apap with how often pt can take med included in the instructions. Pt request to pick up rx on 09/24/16. Call when ready for pick up. Pt has been taking med twice a day prn. Pt will bring back hydrocodone rx that was printed on 09/22/16 when picks up new rx.

## 2016-09-23 NOTE — Telephone Encounter (Addendum)
Pt brought back hydrocodone apap rx and left the rx of hydrocodone apap that she was to pick up. The pharmacy has to have instructions changed to how often pt should take med. Pt states she takes med bid prn. Pt request cb when ready for pick up. Left Etheleen Sia LPN the 2 returned Hydrocodone rx on Suzanne's keyboard.

## 2016-09-23 NOTE — Addendum Note (Signed)
Addended by: Helene Shoe on: 09/23/2016 01:13 PM   Modules accepted: Orders

## 2016-09-24 MED FILL — HYDROCODON-APAP 7.5-325: 7.5-325 | 30 days supply | Qty: 60 | Fill #0

## 2016-10-17 ENCOUNTER — Other Ambulatory Visit: Payer: Self-pay | Admitting: Family Medicine

## 2016-10-18 NOTE — Telephone Encounter (Signed)
Called pharmacy for refilled

## 2016-10-20 ENCOUNTER — Other Ambulatory Visit: Payer: Self-pay

## 2016-10-20 MED ORDER — HYDROCODONE-ACETAMINOPHEN 7.5-325 MG PO TABS: 1.0000 | ORAL_TABLET | Freq: Two times a day (BID) | ORAL | 0 refills | Status: DC | PRN

## 2016-10-20 NOTE — Telephone Encounter (Signed)
Ok to print out and place on my desk for signature.

## 2016-10-20 NOTE — Telephone Encounter (Signed)
Pt left v/m requesting rx hydrocodone apap. Call when ready for pick up. rx last printed # 60 on 09/23/16; last seen 01/12/16.

## 2016-10-20 NOTE — Telephone Encounter (Signed)
Done...print the Rx and place to Dr. Deborra Medina desk.Marland KitchenMarland Kitchen

## 2016-10-21 ENCOUNTER — Telehealth: Payer: Self-pay | Admitting: *Deleted

## 2016-10-21 NOTE — Telephone Encounter (Signed)
Called pt about Rx ready for pick up front desk.

## 2016-10-21 NOTE — Telephone Encounter (Signed)
Patient returned Heather Shaw's call.  Patient can be reached at her home phone.

## 2016-10-25 MED FILL — HYDROCODON-APAP 7.5-325: 7.5-325 | 30 days supply | Qty: 60 | Fill #0

## 2016-11-01 DIAGNOSIS — M5416 Radiculopathy, lumbar region: Secondary | ICD-10-CM | POA: Diagnosis not present

## 2016-11-05 ENCOUNTER — Other Ambulatory Visit: Payer: Self-pay | Admitting: Family Medicine

## 2016-11-05 ENCOUNTER — Ambulatory Visit: Payer: Medicare Other

## 2016-11-05 DIAGNOSIS — E559 Vitamin D deficiency, unspecified: Secondary | ICD-10-CM

## 2016-11-05 DIAGNOSIS — E785 Hyperlipidemia, unspecified: Secondary | ICD-10-CM

## 2016-11-05 DIAGNOSIS — Z1211 Encounter for screening for malignant neoplasm of colon: Secondary | ICD-10-CM | POA: Insufficient documentation

## 2016-11-05 DIAGNOSIS — Z01419 Encounter for gynecological examination (general) (routine) without abnormal findings: Secondary | ICD-10-CM

## 2016-11-08 ENCOUNTER — Ambulatory Visit (INDEPENDENT_AMBULATORY_CARE_PROVIDER_SITE_OTHER): Payer: Medicare Other

## 2016-11-08 VITALS — BP 132/70 | HR 56 | Temp 97.9°F | Ht 64.25 in | Wt 215.8 lb

## 2016-11-08 DIAGNOSIS — E785 Hyperlipidemia, unspecified: Secondary | ICD-10-CM

## 2016-11-08 DIAGNOSIS — Z01419 Encounter for gynecological examination (general) (routine) without abnormal findings: Secondary | ICD-10-CM

## 2016-11-08 DIAGNOSIS — Z0000 Encounter for general adult medical examination without abnormal findings: Secondary | ICD-10-CM | POA: Diagnosis not present

## 2016-11-08 LAB — LIPID PANEL
CHOL/HDL RATIO: 6
CHOLESTEROL: 229 mg/dL — AB (ref 0–200)
HDL: 39.1 mg/dL (ref >39.00)
NonHDL: 190.34
TRIGLYCERIDES: 247 mg/dL — AB (ref 0.0–149.0)
VLDL: 49.4 mg/dL — ABNORMAL HIGH (ref 0.0–40.0)

## 2016-11-08 LAB — CBC WITH DIFFERENTIAL/PLATELET
BASOS ABS: 0.1 10*3/uL (ref 0.0–0.1)
BASOS PCT: 1 % (ref 0.0–3.0)
EOS ABS: 0.4 10*3/uL (ref 0.0–0.7)
Eosinophils Relative: 4.7 % (ref 0.0–5.0)
HCT: 40.1 % (ref 36.0–46.0)
HEMOGLOBIN: 13.9 g/dL (ref 12.0–15.0)
Lymphocytes Relative: 45.4 % (ref 12.0–46.0)
Lymphs Abs: 3.5 10*3/uL (ref 0.7–4.0)
MCHC: 34.7 g/dL (ref 30.0–36.0)
MCV: 84.5 fl (ref 78.0–100.0)
MONO ABS: 0.6 10*3/uL (ref 0.1–1.0)
Monocytes Relative: 8.4 % (ref 3.0–12.0)
NEUTROS ABS: 3.1 10*3/uL (ref 1.4–7.7)
Neutrophils Relative %: 40.5 % — ABNORMAL LOW (ref 43.0–77.0)
PLATELETS: 222 10*3/uL (ref 150.0–400.0)
RBC: 4.74 Mil/uL (ref 3.87–5.11)
RDW: 13.6 % (ref 11.5–15.5)
WBC: 7.7 10*3/uL (ref 4.0–10.5)

## 2016-11-08 LAB — COMPREHENSIVE METABOLIC PANEL
ALBUMIN: 4.1 g/dL (ref 3.5–5.2)
ALT: 12 U/L (ref 0–35)
AST: 15 U/L (ref 0–37)
Alkaline Phosphatase: 52 U/L (ref 39–117)
BILIRUBIN TOTAL: 0.3 mg/dL (ref 0.2–1.2)
BUN: 12 mg/dL (ref 6–23)
CALCIUM: 9.6 mg/dL (ref 8.4–10.5)
CO2: 30 mEq/L (ref 19–32)
CREATININE: 0.69 mg/dL (ref 0.40–1.20)
Chloride: 103 mEq/L (ref 96–112)
GFR: 88.45 mL/min (ref >60.00)
Glucose, Bld: 106 mg/dL — ABNORMAL HIGH (ref 70–99)
Potassium: 3.6 mEq/L (ref 3.5–5.1)
SODIUM: 138 meq/L (ref 135–145)
TOTAL PROTEIN: 7 g/dL (ref 6.0–8.3)

## 2016-11-08 LAB — LDL CHOLESTEROL, DIRECT: LDL DIRECT: 145 mg/dL

## 2016-11-08 LAB — TSH: TSH: 2.89 u[IU]/mL (ref 0.35–4.50)

## 2016-11-08 NOTE — Patient Instructions (Signed)
Heather Shaw , Thank you for taking time to come for your Medicare Wellness Visit. I appreciate your ongoing commitment to your health goals. Please review the following plan we discussed and let me know if I can assist you in the future.   These are the goals we discussed: Goals    . Increase physical activity          Starting 11/08/2016, I will continue to do stretching for 30 min 3 days per week.        This is a list of the screening recommended for you and due dates:  Health Maintenance  Topic Date Due  . Mammogram  11/08/2017*  . Flu Shot  02/23/2017  . Colon Cancer Screening  07/29/2021  . Tetanus Vaccine  04/23/2024  . DEXA scan (bone density measurement)  Completed  . Pneumonia vaccines  Completed  *Topic was postponed. The date shown is not the original due date.   Preventive Care for Adults  A healthy lifestyle and preventive care can promote health and wellness. Preventive health guidelines for adults include the following key practices.  . A routine yearly physical is a good way to check with your health care provider about your health and preventive screening. It is a chance to share any concerns and updates on your health and to receive a thorough exam.  . Visit your dentist for a routine exam and preventive care every 6 months. Brush your teeth twice a day and floss once a day. Good oral hygiene prevents tooth decay and gum disease.  . The frequency of eye exams is based on your age, health, family medical history, use  of contact lenses, and other factors. Follow your health care provider's ecommendations for frequency of eye exams.  . Eat a healthy diet. Foods like vegetables, fruits, whole grains, low-fat dairy products, and lean protein foods contain the nutrients you need without too many calories. Decrease your intake of foods high in solid fats, added sugars, and salt. Eat the right amount of calories for you. Get information about a proper diet from your  health care provider, if necessary.  . Regular physical exercise is one of the most important things you can do for your health. Most adults should get at least 150 minutes of moderate-intensity exercise (any activity that increases your heart rate and causes you to sweat) each week. In addition, most adults need muscle-strengthening exercises on 2 or more days a week.  Silver Sneakers may be a benefit available to you. To determine eligibility, you may visit the website: www.silversneakers.com or contact program at (605)776-4306 Mon-Fri between 8AM-8PM.   . Maintain a healthy weight. The body mass index (BMI) is a screening tool to identify possible weight problems. It provides an estimate of body fat based on height and weight. Your health care provider can find your BMI and can help you achieve or maintain a healthy weight.   For adults 20 years and older: ? A BMI below 18.5 is considered underweight. ? A BMI of 18.5 to 24.9 is normal. ? A BMI of 25 to 29.9 is considered overweight. ? A BMI of 30 and above is considered obese.   . Maintain normal blood lipids and cholesterol levels by exercising and minimizing your intake of saturated fat. Eat a balanced diet with plenty of fruit and vegetables. Blood tests for lipids and cholesterol should begin at age 28 and be repeated every 5 years. If your lipid or cholesterol levels are high, you  are over 42, or you are at high risk for heart disease, you may need your cholesterol levels checked more frequently. Ongoing high lipid and cholesterol levels should be treated with medicines if diet and exercise are not working.  . If you smoke, find out from your health care provider how to quit. If you do not use tobacco, please do not start.  . If you choose to drink alcohol, please do not consume more than 2 drinks per day. One drink is considered to be 12 ounces (355 mL) of beer, 5 ounces (148 mL) of wine, or 1.5 ounces (44 mL) of liquor.  . If you are  67-50 years old, ask your health care provider if you should take aspirin to prevent strokes.  . Use sunscreen. Apply sunscreen liberally and repeatedly throughout the day. You should seek shade when your shadow is shorter than you. Protect yourself by wearing long sleeves, pants, a wide-brimmed hat, and sunglasses year round, whenever you are outdoors.  . Once a month, do a whole body skin exam, using a mirror to look at the skin on your back. Tell your health care provider of new moles, moles that have irregular borders, moles that are larger than a pencil eraser, or moles that have changed in shape or color.

## 2016-11-08 NOTE — Progress Notes (Signed)
Pre visit review using our clinic review tool, if applicable. No additional management support is needed unless otherwise documented below in the visit note. 

## 2016-11-08 NOTE — Progress Notes (Signed)
I reviewed health advisor's note, was available for consultation, and agree with documentation and plan.  

## 2016-11-08 NOTE — Progress Notes (Signed)
Subjective:   Heather Shaw is a 76 y.o. female who presents for Medicare Annual (Subsequent) preventive examination.  Review of Systems: N/A Cardiac Risk Factors include: advanced age (>46men, >24 women);obesity (BMI >30kg/m2);hypertension;dyslipidemia     Objective:     Vitals: BP 132/70 (BP Location: Right Arm, Patient Position: Sitting, Cuff Size: Large)   Pulse (!) 56   Temp 97.9 F (36.6 C) (Oral)   Ht 5' 4.25" (1.632 m) Comment: no shoes  Wt 215 lb 12 oz (97.9 kg)   SpO2 96%   BMI 36.75 kg/m   Body mass index is 36.75 kg/m.   Tobacco History  Smoking Status  . Former Smoker  . Types: Cigarettes  . Quit date: 05/17/1971  Smokeless Tobacco  . Never Used     Counseling given: No   Past Medical History:  Diagnosis Date  . Arthritis   . Bladder pain   . Bradycardia   . DDD (degenerative disc disease), lumbosacral   . Hyperlipidemia   . Hyperplastic colon polyp   . Hypertension   . IC (interstitial cystitis)   . Internal hemorrhoids   . Polyp of rectum   . PONV (postoperative nausea and vomiting)   . Squamous papilloma    in esophageal polyp  . Uterine fibroid   . Vertigo   . Wears glasses   . Wears partial dentures    Past Surgical History:  Procedure Laterality Date  . COLONOSCOPY W/ POLYPECTOMY  2009  . CYSTO WITH HYDRODISTENSION N/A 05/20/2014   Procedure: CYSTOSCOPY/HYDRODISTENSION, INSTILLATION OF CHLORPACTIN;  Surgeon: Bernestine Amass, MD;  Location: [facility];  Service: Urology;  Laterality: N/A;  . CYSTO/  HYDRODISTENTION/  INSTILLATION CLORPACTIC  09-21-2010  . DILATION AND CURETTAGE OF UTERUS  1988  . KNEE ARTHROSCOPY Right 11-18-2009  . TRIGGER FINGER RELEASE Right 08/23/2016   Procedure: RIGHT RING FINGER TRIGGER RELEASE;  Surgeon: Milly Jakob, MD;  Location: [place];  Service: Orthopedics;  Laterality: Right;   Family History  Problem Relation Age of Onset  . Clotting disorder Mother 71  .  COPD Father   . Heart attack Father 26  . Emphysema Father   . Stroke Paternal Aunt   . Hypertension Sister   . Hypertension Brother    History  Sexual Activity  . Sexual activity: No    Outpatient Encounter Prescriptions as of 11/08/2016  Medication Sig  . amLODipine (NORVASC) 10 MG tablet TAKE 1 TABLET BY MOUTH EVERY DAY  . atenolol (TENORMIN) 25 MG tablet Take 1 tablet (25 mg total) by mouth daily.  . fluocinonide-emollient (LIDEX-E) 0.05 % cream Apply 1 application topically 2 (two) times daily.  . hydrochlorothiazide (HYDRODIURIL) 25 MG tablet TAKE 1 TABLET BY MOUTH EVERY DAY  . HYDROcodone-acetaminophen (NORCO) 7.5-325 MG tablet Take 1 tablet by mouth 2 (two) times daily as needed for moderate pain (knee pain).  . magnesium hydroxide (MILK OF MAGNESIA) 400 MG/5ML suspension Take by mouth daily as needed for mild constipation.  . meclizine (ANTIVERT) 12.5 MG tablet Take 1 tablet (12.5 mg total) by mouth 3 (three) times daily as needed for dizziness.  . polyethylene glycol powder (GLYCOLAX/MIRALAX) powder Dissolve 17 grams in at least 8 ounces water/juice and drink once daily  . traMADol (ULTRAM) 50 MG tablet TAKE 1 TABLET BY MOUTH EVERY 6 - 8 HOURS AS NEEDED   No facility-administered encounter medications on file as of 11/08/2016.     Activities of Daily Living In your present  state of health, do you have any difficulty performing the following activities: 11/08/2016 08/23/2016  Hearing? Y N  Vision? N N  Difficulty concentrating or making decisions? N N  Walking or climbing stairs? Y N  Dressing or bathing? N N  Doing errands, shopping? N -  Preparing Food and eating ? N -  Using the Toilet? N -  In the past six months, have you accidently leaked urine? N -  Do you have problems with loss of bowel control? N -  Managing your Medications? N -  Managing your Finances? N -  Housekeeping or managing your Housekeeping? N -  Some recent data might be hidden    Patient Care  Team: Lucille Passy, MD as PCP - General Rana Snare, MD as Consulting Physician (Urology) Maisie Fus, MD as Consulting Physician (Obstetrics and Gynecology) Juluis Rainier as Consulting Physician (Optometry)    Assessment:     Hearing Screening   125Hz  250Hz  500Hz  1000Hz  2000Hz  3000Hz  4000Hz  6000Hz  8000Hz   Right ear:   40 40 0  0    Left ear:   40 40 0  40    Vision Screening Comments: Last vision exam in 2017 with Dr. Alois Cliche   Exercise Activities and Dietary recommendations Current Exercise Habits: Home exercise routine, Type of exercise: stretching, Time (Minutes): 30, Frequency (Times/Week): 3, Weekly Exercise (Minutes/Week): 90, Intensity: Mild  Goals    . Increase physical activity          Starting 11/08/2016, I will continue to do stretching for 30 min 3 days per week.       Fall Risk Fall Risk  11/08/2016 11/03/2015  Falls in the past year? No No   Depression Screen PHQ 2/9 Scores 11/08/2016 11/03/2015 05/29/2012  PHQ - 2 Score 0 0 0     Cognitive Function MMSE - Mini Mental State Exam 11/08/2016 11/03/2015  Orientation to time 5 5  Orientation to Place 5 5  Registration 3 3  Attention/ Calculation 0 0  Recall 3 3  Language- name 2 objects 0 0  Language- repeat 1 1  Language- follow 3 step command 3 3  Language- read & follow direction 0 0  Write a sentence 0 0  Copy design 0 0  Total score 20 20     PLEASE NOTE: A Mini-Cog screen was completed. Maximum score is 20. A value of 0 denotes this part of Folstein MMSE was not completed or the patient failed this part of the Mini-Cog screening.   Mini-Cog Screening Orientation to Time - Max 5 pts Orientation to Place - Max 5 pts Registration - Max 3 pts Recall - Max 3 pts Language Repeat - Max 1 pts Language Follow 3 Step Command - Max 3 pts     Immunization History  Administered Date(s) Administered  . Influenza Split 05/29/2012  . Influenza,inj,Quad PF,36+ Mos 06/23/2016  . Pneumococcal Conjugate-13  11/03/2015  . Pneumococcal Polysaccharide-23 07/14/2006, 05/29/2012  . Td 07/11/2008  . Tdap 04/23/2014  . Zoster 10/27/2010   Screening Tests Health Maintenance  Topic Date Due  . MAMMOGRAM  11/08/2017 (Originally 09/22/2012)  . INFLUENZA VACCINE  02/23/2017  . COLONOSCOPY  07/29/2021  . TETANUS/TDAP  04/23/2024  . DEXA SCAN  Completed  . PNA vac Low Risk Adult  Completed      Plan:     I have personally reviewed and addressed the Medicare Annual Wellness questionnaire and have noted the following in the patient's chart:  A. Medical and social history B. Use of alcohol, tobacco or illicit drugs  C. Current medications and supplements D. Functional ability and status E.  Nutritional status F.  Physical activity G. Advance directives H. List of other physicians I.  Hospitalizations, surgeries, and ER visits in previous 12 months J.  [place] to include hearing, vision, cognitive, depression L. Referrals and appointments - none  In addition, I have reviewed and discussed with patient certain preventive protocols, quality metrics, and best practice recommendations. A written personalized care plan for preventive services as well as general preventive health recommendations were provided to patient.  See attached scanned questionnaire for additional information.   Signed,   Lindell Noe, MHA, BS, LPN Health Coach

## 2016-11-08 NOTE — Progress Notes (Signed)
PCP notes:   Health maintenance:  Mammogram - addressed; pt plans to schedule future appt  Abnormal screenings:   Hearing - failed  Patient concerns:   Pt has concerns with chronic right knee and right leg pain. Pain scale: 5/10.  Nurse concerns:  None  Next PCP appt:   11/15/16 @ 1400

## 2016-11-12 DIAGNOSIS — M5416 Radiculopathy, lumbar region: Secondary | ICD-10-CM | POA: Diagnosis not present

## 2016-11-15 ENCOUNTER — Ambulatory Visit: Payer: Medicare Other | Admitting: Family Medicine

## 2016-11-15 DIAGNOSIS — M25561 Pain in right knee: Secondary | ICD-10-CM | POA: Diagnosis not present

## 2016-11-16 ENCOUNTER — Other Ambulatory Visit: Payer: Self-pay | Admitting: Family Medicine

## 2016-11-16 DIAGNOSIS — G5602 Carpal tunnel syndrome, left upper limb: Secondary | ICD-10-CM | POA: Diagnosis not present

## 2016-11-16 NOTE — Telephone Encounter (Signed)
Received refill electronically Last refill 10/18/16 #60 Has upcoming appointment with Dr Deborra Medina 11/23/16 See allergy/contraindication

## 2016-11-16 NOTE — Telephone Encounter (Signed)
Ok to phone in Tramadol 

## 2016-11-16 NOTE — Telephone Encounter (Signed)
Left refill on voice mail at pharmacy  

## 2016-11-18 ENCOUNTER — Other Ambulatory Visit: Payer: Self-pay

## 2016-11-18 MED ORDER — HYDROCODONE-ACETAMINOPHEN 7.5-325 MG PO TABS: 1.0000 | ORAL_TABLET | Freq: Two times a day (BID) | ORAL | 0 refills | Status: DC | PRN

## 2016-11-18 NOTE — Telephone Encounter (Signed)
RX printed and signed and placed in MYD box 

## 2016-11-18 NOTE — Telephone Encounter (Signed)
Pt left v/m requesting rx hydrocodone apap. Call when ready for pick up. Last printed # 60 on 11/20/16 and pt last seen 01/12/16 f/u and last acute 09/09/16.

## 2016-11-19 ENCOUNTER — Encounter: Payer: Self-pay | Admitting: Family Medicine

## 2016-11-19 NOTE — Telephone Encounter (Signed)
Rx placed in the front office

## 2016-11-22 ENCOUNTER — Encounter: Payer: Self-pay | Admitting: Family Medicine

## 2016-11-22 DIAGNOSIS — Z79891 Long term (current) use of opiate analgesic: Secondary | ICD-10-CM | POA: Diagnosis not present

## 2016-11-22 MED FILL — HYDROCODON-APAP 7.5-325: 7.5-325 | 30 days supply | Qty: 60 | Fill #0

## 2016-11-23 ENCOUNTER — Encounter: Payer: Self-pay | Admitting: Family Medicine

## 2016-11-23 ENCOUNTER — Ambulatory Visit (INDEPENDENT_AMBULATORY_CARE_PROVIDER_SITE_OTHER): Payer: Medicare Other | Admitting: Family Medicine

## 2016-11-23 VITALS — BP 132/80 | HR 60 | Temp 98.7°F | Wt 214.0 lb

## 2016-11-23 DIAGNOSIS — N3010 Interstitial cystitis (chronic) without hematuria: Secondary | ICD-10-CM | POA: Diagnosis not present

## 2016-11-23 DIAGNOSIS — E785 Hyperlipidemia, unspecified: Secondary | ICD-10-CM

## 2016-11-23 DIAGNOSIS — G4700 Insomnia, unspecified: Secondary | ICD-10-CM | POA: Diagnosis not present

## 2016-11-23 DIAGNOSIS — M5137 Other intervertebral disc degeneration, lumbosacral region: Secondary | ICD-10-CM | POA: Diagnosis not present

## 2016-11-23 DIAGNOSIS — G5601 Carpal tunnel syndrome, right upper limb: Secondary | ICD-10-CM | POA: Diagnosis not present

## 2016-11-23 DIAGNOSIS — H9190 Unspecified hearing loss, unspecified ear: Secondary | ICD-10-CM | POA: Diagnosis not present

## 2016-11-23 NOTE — Assessment & Plan Note (Signed)
Deteriorated. Refusing statin at this time. Wants to work on diet- given handout on low cholesterol diet.

## 2016-11-23 NOTE — Assessment & Plan Note (Signed)
Has not been to urology in awhile. She does feel her symptoms are worsened due to diet.

## 2016-11-23 NOTE — Assessment & Plan Note (Signed)
Feels she is sleeping well without lunesta or gabapentin. Working on sleep hygeine.

## 2016-11-23 NOTE — Assessment & Plan Note (Signed)
Scheduled to have left carpal tunnel release this Friday.

## 2016-11-23 NOTE — Addendum Note (Signed)
Addended by: Lucille Passy on: 11/23/2016 11:22 AM   Modules accepted: Orders

## 2016-11-23 NOTE — Assessment & Plan Note (Signed)
Audiology referral placed.

## 2016-11-23 NOTE — Assessment & Plan Note (Signed)
Along with OA in needs and wrists. Pt reviewed in [place] controlled substances database- no violations noted.

## 2016-11-23 NOTE — Patient Instructions (Signed)
Great to see you. Good luck with your surgeries.  Please work on your low cholesterol diet.

## 2016-11-23 NOTE — Progress Notes (Addendum)
Subjective:   Patient ID: Heather Shaw, female    DOB: 11-Mar-1941, 76 y.o.   MRN: [id number]  Heather Shaw is a pleasant 76 y.o. year old female who presents to clinic today with Medicare Wellness (Part 2. Saw Katha Cabal 11-08-16); Hearing Problem (Would like a referral to an audiologist.); and UDS Is Due  on 11/23/2016  HPI:  Saw Candis Musa, RN on 09/10/16 for annual medicare wellness visit. Note reviewed.  Hearing loss- has noticed she has to turn up volume on TV at home, harder time hearing.  HTN- also followed by cardiology.  Currently taking HCTZ 25 mg daily, along with Atenolol and Norvasc 10 mg daily. Denies HA, blurred vision, CP, SOB or LE edema.  Lab Results  Component Value Date   CREATININE 0.69 11/08/2016   IC- was followed by Dr. Risa Grill.  Symptoms were controlled with Uribel but deteriorated lately, pt feels due to diet..  Insomnia- stopped taking Lunesta and Gapapentin.  Sleeping fine now unless her knee pain wakes her up and she takes a Norco and goes back to sleep.  Chronic pain- hip, back (DDD) and right knee. Has seen multiple orthopedists.   Take tramadol and Norco. Tries not to take Norco except at night.   HLD- deteriorated. Admits to not eating as well.  Lab Results  Component Value Date   CHOL 229 (H) 11/08/2016   HDL 39.10 11/08/2016   LDLCALC 134 (H) 12/29/2015   LDLDIRECT 145.0 11/08/2016   TRIG 247.0 (H) 11/08/2016   CHOLHDL 6 11/08/2016   Wt Readings from Last 3 Encounters:  11/23/16 214 lb (97.1 kg)  11/08/16 215 lb 12 oz (97.9 kg)  09/09/16 217 lb (98.4 kg)    Current Outpatient Prescriptions on File Prior to Visit  Medication Sig Dispense Refill  . amLODipine (NORVASC) 10 MG tablet TAKE 1 TABLET BY MOUTH EVERY DAY 90 tablet 1  . atenolol (TENORMIN) 25 MG tablet Take 1 tablet (25 mg total) by mouth daily. 90 tablet 3  . fluocinonide-emollient (LIDEX-E) 0.05 % cream Apply 1 application topically 2 (two) times daily. 30 g 0  .  hydrochlorothiazide (HYDRODIURIL) 25 MG tablet TAKE 1 TABLET BY MOUTH EVERY DAY 90 tablet 1  . HYDROcodone-acetaminophen (NORCO) 7.5-325 MG tablet Take 1 tablet by mouth 2 (two) times daily as needed for moderate pain (knee pain). 60 tablet 0  . magnesium hydroxide (MILK OF MAGNESIA) 400 MG/5ML suspension Take by mouth daily as needed for mild constipation.    . meclizine (ANTIVERT) 12.5 MG tablet Take 1 tablet (12.5 mg total) by mouth 3 (three) times daily as needed for dizziness. 30 tablet 0  . polyethylene glycol powder (GLYCOLAX/MIRALAX) powder Dissolve 17 grams in at least 8 ounces water/juice and drink once daily 527 g 3  . traMADol (ULTRAM) 50 MG tablet TAKE 1 TABLET BY MOUTH EVERY 6 HOURS AS NEEDED FOR PAIN 60 tablet 0   No current facility-administered medications on file prior to visit.     Allergies  Allergen Reactions  . Contrast Media [Iodinated Diagnostic Agents]     ? angioedema  . Lisinopril     ? angioedema  . Allegra [Fexofenadine] Other (See Comments)    Makes pt nervous  . Codeine Nausea And Vomiting  . Prednisone     Causes elevation in blood pressure- ? All steroids cause same reaction  . Zithromax [Azithromycin] Other (See Comments)    Burns stomach  . Sulfa Antibiotics Rash    Past Medical History:  Diagnosis Date  . Arthritis   . Bladder pain   . Bradycardia   . DDD (degenerative disc disease), lumbosacral   . Hyperlipidemia   . Hyperplastic colon polyp   . Hypertension   . IC (interstitial cystitis)   . Internal hemorrhoids   . Polyp of rectum   . PONV (postoperative nausea and vomiting)   . Squamous papilloma    in esophageal polyp  . Uterine fibroid   . Vertigo   . Wears glasses   . Wears partial dentures     Past Surgical History:  Procedure Laterality Date  . COLONOSCOPY W/ POLYPECTOMY  2009  . CYSTO WITH HYDRODISTENSION N/A 05/20/2014   Procedure: CYSTOSCOPY/HYDRODISTENSION, INSTILLATION OF CHLORPACTIN;  Surgeon: Bernestine Amass, MD;   Location: [facility];  Service: Urology;  Laterality: N/A;  . CYSTO/  HYDRODISTENTION/  INSTILLATION CLORPACTIC  09-21-2010  . DILATION AND CURETTAGE OF UTERUS  1988  . KNEE ARTHROSCOPY Right 11-18-2009  . TRIGGER FINGER RELEASE Right 08/23/2016   Procedure: RIGHT RING FINGER TRIGGER RELEASE;  Surgeon: Milly Jakob, MD;  Location: [place];  Service: Orthopedics;  Laterality: Right;    Family History  Problem Relation Age of Onset  . Clotting disorder Mother 64  . COPD Father   . Heart attack Father 51  . Emphysema Father   . Stroke Paternal Aunt   . Hypertension Sister   . Hypertension Brother     Social History   Social History  . Marital status: Married    Spouse name: N/A  . Number of children: 6  . Years of education: N/A   Occupational History  . housewife    Social History Main Topics  . Smoking status: Former Smoker    Types: Cigarettes    Quit date: 05/17/1971  . Smokeless tobacco: Never Used  . Alcohol use No  . Drug use: No  . Sexual activity: No   Other Topics Concern  . Not on file   Social History Narrative  . No narrative on file   The PMH, PSH, Social History, Family History, Medications, and allergies have been reviewed in [facility], and have been updated if relevant.   Review of Systems  Constitutional: Negative.   HENT: Positive for hearing loss.   Respiratory: Negative.   Cardiovascular: Negative.   Gastrointestinal: Negative.   Endocrine: Negative.   Genitourinary: Positive for frequency.  Musculoskeletal: Positive for arthralgias.  Allergic/Immunologic: Negative.   Neurological: Negative.   Hematological: Negative.   Psychiatric/Behavioral: Negative.   All other systems reviewed and are negative.      Objective:    BP 132/80 (BP Location: Left Arm, Patient Position: Sitting, Cuff Size: Large)   Pulse 60   Temp 98.7 F (37.1 C) (Oral)   Wt 214 lb (97.1 kg)   SpO2 96%   BMI 36.45 kg/m     Physical Exam   General:  Well-developed,well-nourished,in no acute distress; alert,appropriate and cooperative throughout examination Head:  normocephalic and atraumatic.   Eyes:  vision grossly intact, PERRL Ears:  R ear normal and L ear normal externally, TMs clear bilaterally Nose:  no external deformity.   Mouth:  good dentition.   Neck:  No deformities, masses, or tenderness noted  Lungs:  Normal respiratory effort, chest expands symmetrically. Lungs are clear to auscultation, no crackles or wheezes. Heart:  Normal rate and regular rhythm. S1 and S2 normal without gallop, murmur, click, rub or other extra sounds. Abdomen:  Bowel sounds positive,abdomen  soft and non-tender without masses, organomegaly or hernias noted. Msk:  No deformity or scoliosis noted of thoracic or lumbar spine.   Extremities:  No clubbing, cyanosis, edema, or deformity noted with normal full range of motion of all joints.   Neurologic:  alert & oriented X3 and gait normal.   Skin:  Intact without suspicious lesions or rashes Cervical Nodes:  No lymphadenopathy noted Axillary Nodes:  No palpable lymphadenopathy Psych:  Cognition and judgment appear intact. Alert and cooperative with normal attention span and concentration. No apparent delusions, illusions, hallucinations       Assessment & Plan:   Hyperlipidemia, unspecified hyperlipidemia type  Chronic interstitial cystitis  DDD (degenerative disc disease), lumbosacral  Carpal tunnel syndrome of right wrist  Insomnia, unspecified type No Follow-up on file.

## 2016-11-23 NOTE — Progress Notes (Signed)
Pre visit review using our clinic review tool, if applicable. No additional management support is needed unless otherwise documented below in the visit note. 

## 2016-11-26 DIAGNOSIS — G5602 Carpal tunnel syndrome, left upper limb: Secondary | ICD-10-CM | POA: Diagnosis not present

## 2016-12-13 ENCOUNTER — Telehealth: Payer: Self-pay

## 2016-12-13 DIAGNOSIS — G5602 Carpal tunnel syndrome, left upper limb: Secondary | ICD-10-CM | POA: Diagnosis not present

## 2016-12-13 NOTE — Telephone Encounter (Signed)
Pt left v/m; pt was seen by Dr Grandville Silos; the hand surgeon; Dr Grandville Silos' asst said since pt has contract for pain med with Dr Deborra Medina to bring pain med to [facility] that pt did not take from Dr Grandville Silos. Pt request cb with what Dr Deborra Medina wants her to do with pain med from Dr Biagio Borg office. Pt did not take any of that pain med. Pt request cb.

## 2016-12-13 NOTE — Telephone Encounter (Signed)
Yes please advise her to bring the remainder to our office.

## 2016-12-13 NOTE — Telephone Encounter (Signed)
She will bring them tomorrow so Dr. Deborra Medina can count them.

## 2016-12-14 DIAGNOSIS — H903 Sensorineural hearing loss, bilateral: Secondary | ICD-10-CM | POA: Diagnosis not present

## 2016-12-16 ENCOUNTER — Telehealth: Payer: Self-pay

## 2016-12-16 MED ORDER — HYDROCODONE-ACETAMINOPHEN 7.5-325 MG PO TABS
1.0000 | ORAL_TABLET | Freq: Two times a day (BID) | ORAL | 0 refills | Status: DC | PRN
Start: 2016-12-16 — End: 2017-01-19

## 2016-12-16 NOTE — Telephone Encounter (Signed)
Pt left v/m; pt had to take couple extra hydrocodone apap per day for 3 days after hand surgery. Pt request refill hydrocodone apap. Call when ready for pick up. Last printed # 60 on 11/18/16. Last seen 11/23/16.

## 2016-12-16 NOTE — Telephone Encounter (Signed)
LM on patient's home and mobile voice mail to call me for follow up.  No details were left on personal answering machines.

## 2016-12-16 NOTE — Telephone Encounter (Signed)
Spoke with Dr. Deborra Medina on 12/14/16 regarding directives for patient narcotic management.  Dr. Deborra Medina reports being contacted by the orthopedic surgeon to inform that following her surgery she was given an R/x for a narcotic as he didn't realize that she had a pain contract.   Patient was then informed by the orthopedic office that she may be in violation of her contract with Dr. Deborra Medina and should bring the medication to Dr. Hulen Shouts office for counting and validation. Patient denies being aware that this was a violation and states that she has not taken any of the medication and will bring in.     Patient came in on 12/14/16 and left her bottle of #20 Oxycodone 5mg  as originally prescribed by Dr. Grandville Silos at our front window. She was under the impression that she should leave the medication with [place] for count and maybe disposal.    Dr. Deborra Medina and I discussed this incident in length.  Patient did what she was told by Dr. Biagio Borg office and returned the medication to our office for count and verification. As this was not an intentional event to obtain narcotics from other providers and this narcotic is validated and accounted for, Dr.Aron does not consider patient to be in violation of her contract and she will continue to prescribe her medication as appropriate.  However, our office cannot dispose of patient's medication.    Dr.Aron please advise:  1.  Any additional comments you would like to add to this?  2.  Since our office cannot, in any capacity, be responsible for the handling and destruction of this medication and per our discussion, are you comfortable with the patient picking up her R/X to dispose of appropriately?   I will contact patient after your reply with instructions.  Thanks.

## 2016-12-16 NOTE — Telephone Encounter (Signed)
Agree with Mandy's summary of events.  Yes, I am ok with patient picking up rx at this point.

## 2016-12-16 NOTE — Telephone Encounter (Signed)
Left message rx ready for pick up, pt aware. Placed rx in blue folder.

## 2016-12-16 NOTE — Patient Instructions (Signed)
Erroneous Encounter

## 2016-12-17 NOTE — Telephone Encounter (Signed)
Heather Shaw with [facility] left v/m; pt presented hydrocodone apap rx and Jinny Blossom said appears 5 days early to fill; Heather Shaw read the note about taking extra after surgery but needs confirmation that pt can get hydrocodone apap filled early.

## 2016-12-20 NOTE — Telephone Encounter (Signed)
Yes ok to fill early

## 2016-12-21 DIAGNOSIS — D229 Melanocytic nevi, unspecified: Secondary | ICD-10-CM | POA: Diagnosis not present

## 2016-12-21 DIAGNOSIS — D1801 Hemangioma of skin and subcutaneous tissue: Secondary | ICD-10-CM | POA: Diagnosis not present

## 2016-12-21 DIAGNOSIS — L738 Other specified follicular disorders: Secondary | ICD-10-CM | POA: Diagnosis not present

## 2016-12-21 DIAGNOSIS — L820 Inflamed seborrheic keratosis: Secondary | ICD-10-CM | POA: Diagnosis not present

## 2016-12-21 DIAGNOSIS — L821 Other seborrheic keratosis: Secondary | ICD-10-CM | POA: Diagnosis not present

## 2016-12-21 DIAGNOSIS — I781 Nevus, non-neoplastic: Secondary | ICD-10-CM | POA: Diagnosis not present

## 2016-12-21 MED FILL — HYDROCODON-APAP 7.5-325: 7.5-325 | 30 days supply | Qty: 60 | Fill #0

## 2016-12-21 NOTE — Telephone Encounter (Signed)
Spoke with patient on 12/17/16 and communicated information from Dr. Deborra Medina as mentioned in previous notes.  Patient aware that she is to come and pick up her bottle of medication to dispose of.  Proper pill disposal site information will be provided at pick up.  Patient verbalizes understanding of all information given today and knows for future reference to make any prescribing doctor's aware of her pain med contract with Dr. Deborra Medina to avoid any future issues or confusion.

## 2016-12-21 NOTE — Telephone Encounter (Signed)
Left detailed message.   

## 2016-12-24 ENCOUNTER — Telehealth: Payer: Self-pay | Admitting: Family Medicine

## 2016-12-24 DIAGNOSIS — L820 Inflamed seborrheic keratosis: Secondary | ICD-10-CM | POA: Insufficient documentation

## 2016-12-24 NOTE — Telephone Encounter (Signed)
Pt is aware her pill are locked up in erins office She is aware she can come pick them up Monday and ask for erin or mandy

## 2016-12-29 NOTE — Telephone Encounter (Signed)
Pt came into office to pickup Rx of oxycodone and was advised it may be dropped off at [facility]

## 2017-01-04 ENCOUNTER — Other Ambulatory Visit: Payer: Self-pay | Admitting: Family Medicine

## 2017-01-06 ENCOUNTER — Other Ambulatory Visit: Payer: Self-pay | Admitting: Family Medicine

## 2017-01-14 ENCOUNTER — Other Ambulatory Visit: Payer: Self-pay | Admitting: Family Medicine

## 2017-01-17 ENCOUNTER — Other Ambulatory Visit: Payer: Self-pay | Admitting: Family Medicine

## 2017-01-19 ENCOUNTER — Other Ambulatory Visit: Payer: Self-pay

## 2017-01-19 MED ORDER — HYDROCODONE-ACETAMINOPHEN 7.5-325 MG PO TABS: 1.0000 | ORAL_TABLET | Freq: Two times a day (BID) | ORAL | 0 refills | Status: DC | PRN

## 2017-01-19 NOTE — Telephone Encounter (Signed)
Pt left v/m requesting rx hydrocodone apap. Call when ready for pick up. rx last printed # 60 on 12/17/46. Pt last seen 11/23/16.

## 2017-01-19 NOTE — Telephone Encounter (Signed)
[id number] [place], [place]: [phone]

## 2017-01-19 NOTE — Telephone Encounter (Signed)
Last refill 11/16/16 Last OV 11/23/16 Ok to refill?

## 2017-01-20 MED FILL — HYDROCODON-APAP 7.5-325: 7.5-325 | 30 days supply | Qty: 60 | Fill #0

## 2017-01-20 NOTE — Telephone Encounter (Signed)
Left message. RX placed in blue folder ready for pick up.

## 2017-02-06 ENCOUNTER — Other Ambulatory Visit: Payer: Self-pay | Admitting: Nurse Practitioner

## 2017-02-07 NOTE — Addendum Note (Signed)
Addendum  created 02/07/17 1653 by Annye Asa, MD   Delete clinical note, Sign clinical note

## 2017-02-07 NOTE — Addendum Note (Signed)
Addendum  created 02/07/17 1518 by Annye Asa, MD   Sign clinical note

## 2017-02-07 NOTE — Anesthesia Postprocedure Evaluation (Deleted)
Anesthesia Post Note  Patient: Heather Shaw  Procedure(s) Performed: Procedure(s) (LRB): RIGHT RING FINGER TRIGGER RELEASE (Right)     Anesthesia Post Evaluation  Last Vitals:  Vitals:   08/23/16 0830 08/23/16 0900  BP: 129/77 (!) 137/58  Pulse: (!) 52 (!) 54  Resp: 12 16  Temp:  36.5 C    Last Pain:  Vitals:   08/24/16 1019  TempSrc:   PainSc: 1                  Lillias Difrancesco,E. Tatum Massman

## 2017-02-16 ENCOUNTER — Other Ambulatory Visit: Payer: Self-pay

## 2017-02-16 MED ORDER — HYDROCODONE-ACETAMINOPHEN 7.5-325 MG PO TABS: 1.0000 | ORAL_TABLET | Freq: Two times a day (BID) | ORAL | 0 refills | Status: DC | PRN

## 2017-02-16 NOTE — Telephone Encounter (Signed)
Pt left v/m requesting rx hydrocodone apap. Call when ready for pick up. rx last printed # 60 on 01/19/17. Last seen [facility] on 11/23/16.

## 2017-02-16 NOTE — Telephone Encounter (Signed)
RX placed in blue folder ready for pick up, patient aware.

## 2017-02-18 MED FILL — HYDROCODON-APAP 7.5-325: 7.5-325 | 30 days supply | Qty: 60 | Fill #0

## 2017-02-23 DIAGNOSIS — G5602 Carpal tunnel syndrome, left upper limb: Secondary | ICD-10-CM | POA: Diagnosis not present

## 2017-02-23 DIAGNOSIS — M5412 Radiculopathy, cervical region: Secondary | ICD-10-CM | POA: Diagnosis not present

## 2017-02-25 ENCOUNTER — Other Ambulatory Visit: Payer: Self-pay

## 2017-02-25 MED ORDER — TRAMADOL HCL 50 MG PO TABS: 50.0000 mg | ORAL_TABLET | Freq: Four times a day (QID) | ORAL | 0 refills | Status: DC | PRN

## 2017-02-25 NOTE — Telephone Encounter (Signed)
Last refill 01/19/17  Last OV 11/23/16  Ok to refill?

## 2017-02-25 NOTE — Telephone Encounter (Signed)
[id number] [place] [place] [facility] [address] [phone]   

## 2017-03-01 ENCOUNTER — Ambulatory Visit (INDEPENDENT_AMBULATORY_CARE_PROVIDER_SITE_OTHER): Payer: Medicare Other | Admitting: Family Medicine

## 2017-03-01 ENCOUNTER — Encounter: Payer: Self-pay | Admitting: Family Medicine

## 2017-03-01 DIAGNOSIS — M79674 Pain in right toe(s): Secondary | ICD-10-CM

## 2017-03-01 LAB — URIC ACID: Uric Acid, Serum: 4.3 mg/dL (ref 2.4–7.0)

## 2017-03-01 NOTE — Progress Notes (Signed)
Subjective:   Patient ID: Heather Shaw, female    DOB: September 01, 1940, 76 y.o.   MRN: [id number]  Heather Shaw is a pleasant 76 y.o. year old female who presents to clinic today with Toe Pain (R, swelling going on for a month now. )  on 03/01/2017  HPI:  Right great toe pain and swelling- right lateral edge. Ongoing for a month. Red and warm at times, not now.  Pain with shoes and but sometimes when she is barefoot as well.  Current Outpatient Prescriptions on File Prior to Visit  Medication Sig Dispense Refill  . amLODipine (NORVASC) 10 MG tablet TAKE 1 TABLET BY MOUTH EVERY DAY 90 tablet 2  . atenolol (TENORMIN) 25 MG tablet TAKE 1 TABLET (25 MG TOTAL) BY MOUTH DAILY. 90 tablet 1  . Fluocinonide Emulsified Base 0.05 % CREA APPLY 1 APPLICATION TOPICALLY 2 (TWO) TIMES DAILY. 30 g 0  . fluocinonide-emollient (LIDEX-E) 0.05 % cream Apply 1 application topically 2 (two) times daily. 30 g 0  . hydrochlorothiazide (HYDRODIURIL) 25 MG tablet TAKE 1 TABLET BY MOUTH EVERY DAY 90 tablet 1  . HYDROcodone-acetaminophen (NORCO) 7.5-325 MG tablet Take 1 tablet by mouth 2 (two) times daily as needed for moderate pain (knee pain). 60 tablet 0  . magnesium hydroxide (MILK OF MAGNESIA) 400 MG/5ML suspension Take by mouth daily as needed for mild constipation.    . meclizine (ANTIVERT) 12.5 MG tablet Take 1 tablet (12.5 mg total) by mouth 3 (three) times daily as needed for dizziness. 30 tablet 0  . Meth-Hyo-M Bl-Na Phos-Ph Sal (URO-MP) 118 MG CAPS Take 1 capsule by mouth 3 (three) times daily as needed.  3  . polyethylene glycol powder (GLYCOLAX/MIRALAX) powder Dissolve 17 grams in at least 8 ounces water/juice and drink once daily 527 g 3  . traMADol (ULTRAM) 50 MG tablet Take 1 tablet (50 mg total) by mouth every 6 (six) hours as needed. for pain 60 tablet 0   No current facility-administered medications on file prior to visit.     Allergies  Allergen Reactions  . Contrast Media [Iodinated  Diagnostic Agents]     ? angioedema  . Lisinopril     ? angioedema  . Allegra [Fexofenadine] Other (See Comments)    Makes pt nervous  . Codeine Nausea And Vomiting  . Prednisone     Causes elevation in blood pressure- ? All steroids cause same reaction  . Zithromax [Azithromycin] Other (See Comments)    Burns stomach  . Sulfa Antibiotics Rash    Past Medical History:  Diagnosis Date  . Arthritis   . Bladder pain   . Bradycardia   . DDD (degenerative disc disease), lumbosacral   . Hyperlipidemia   . Hyperplastic colon polyp   . Hypertension   . IC (interstitial cystitis)   . Internal hemorrhoids   . Polyp of rectum   . PONV (postoperative nausea and vomiting)   . Squamous papilloma    in esophageal polyp  . Uterine fibroid   . Vertigo   . Wears glasses   . Wears partial dentures     Past Surgical History:  Procedure Laterality Date  . COLONOSCOPY W/ POLYPECTOMY  2009  . CYSTO WITH HYDRODISTENSION N/A 05/20/2014   Procedure: CYSTOSCOPY/HYDRODISTENSION, INSTILLATION OF CHLORPACTIN;  Surgeon: Bernestine Amass, MD;  Location: [facility];  Service: Urology;  Laterality: N/A;  . CYSTO/  HYDRODISTENTION/  INSTILLATION CLORPACTIC  09-21-2010  . DILATION AND CURETTAGE OF UTERUS  [place] ARTHROSCOPY Right 11-18-2009  . TRIGGER FINGER RELEASE Right 08/23/2016   Procedure: RIGHT RING FINGER TRIGGER RELEASE;  Surgeon: Milly Jakob, MD;  Location: [place];  Service: Orthopedics;  Laterality: Right;    Family History  Problem Relation Age of Onset  . Clotting disorder Mother 57  . COPD Father   . Heart attack Father 21  . Emphysema Father   . Stroke Paternal Aunt   . Hypertension Sister   . Hypertension Brother     Social History   Social History  . Marital status: Married    Spouse name: N/A  . Number of children: 6  . Years of education: N/A   Occupational History  . housewife    Social History Main Topics  . Smoking  status: Former Smoker    Types: Cigarettes    Quit date: 05/17/1971  . Smokeless tobacco: Never Used  . Alcohol use No  . Drug use: No  . Sexual activity: No   Other Topics Concern  . Not on file   Social History Narrative  . No narrative on file   The PMH, PSH, Social History, Family History, Medications, and allergies have been reviewed in [facility], and have been updated if relevant.   Review of Systems  Constitutional: Negative.   HENT: Negative.   Respiratory: Negative.   Cardiovascular: Negative.   Gastrointestinal: Negative.   Endocrine: Negative.   Genitourinary: Negative.   Musculoskeletal: Positive for arthralgias.  Allergic/Immunologic: Negative.   Neurological: Negative.   Hematological: Negative.   Psychiatric/Behavioral: Negative.   All other systems reviewed and are negative.      Objective:    BP 130/70   Pulse 60   Ht 5' 4.5" (1.638 m)   Wt 219 lb (99.3 kg)   SpO2 98%   BMI 37.01 kg/m    Physical Exam  Constitutional: She is oriented to person, place, and time. She appears well-developed and well-nourished. No distress.  HENT:  Head: Normocephalic and atraumatic.  Eyes: Conjunctivae are normal.  Cardiovascular: Normal rate.   Pulmonary/Chest: Effort normal.  Musculoskeletal: Normal range of motion.  Neurological: She is alert and oriented to person, place, and time. No cranial nerve deficit.  Skin: She is not diaphoretic.     Psychiatric: She has a normal mood and affect. Her behavior is normal. Judgment and thought content normal.  Nursing note and vitals reviewed.         Assessment & Plan:   Great toe pain, right - Plan: Ambulatory referral to Podiatry, Uric Acid No Follow-up on file.

## 2017-03-01 NOTE — Patient Instructions (Signed)
Great to see you.  Please stop by to see Heather Shaw on your way out.  I will call you with your lab results.

## 2017-03-01 NOTE — Assessment & Plan Note (Signed)
New- refer to podiatry- re: ingrown toe nail. Given duration and severity of pain, will also check uric acid to rule out gout although this is less likely. The patient indicates understanding of these issues and agrees with the plan. Orders Placed This Encounter  Procedures  . Uric Acid  . Ambulatory referral to Podiatry

## 2017-03-04 DIAGNOSIS — M23351 Other meniscus derangements, posterior horn of lateral meniscus, right knee: Secondary | ICD-10-CM | POA: Diagnosis not present

## 2017-03-04 DIAGNOSIS — M6751 Plica syndrome, right knee: Secondary | ICD-10-CM | POA: Diagnosis not present

## 2017-03-04 DIAGNOSIS — M2241 Chondromalacia patellae, right knee: Secondary | ICD-10-CM | POA: Diagnosis not present

## 2017-03-04 DIAGNOSIS — S83271A Complex tear of lateral meniscus, current injury, right knee, initial encounter: Secondary | ICD-10-CM | POA: Diagnosis not present

## 2017-03-04 DIAGNOSIS — G8918 Other acute postprocedural pain: Secondary | ICD-10-CM | POA: Diagnosis not present

## 2017-03-08 ENCOUNTER — Telehealth: Payer: Self-pay

## 2017-03-08 NOTE — Telephone Encounter (Signed)
Thank you for letting me know

## 2017-03-08 NOTE — Telephone Encounter (Signed)
Pt left v/m; pt had knee surgery on 03/04/17 and pt is doing well. Pt was given # 30 of hydrocodone apap upon discharge. FYI to Dr Deborra Medina.

## 2017-03-18 DIAGNOSIS — Z9889 Other specified postprocedural states: Secondary | ICD-10-CM | POA: Diagnosis not present

## 2017-03-29 ENCOUNTER — Other Ambulatory Visit: Payer: Self-pay

## 2017-03-29 DIAGNOSIS — M25561 Pain in right knee: Secondary | ICD-10-CM | POA: Diagnosis not present

## 2017-03-29 DIAGNOSIS — Z9889 Other specified postprocedural states: Secondary | ICD-10-CM | POA: Diagnosis not present

## 2017-03-29 DIAGNOSIS — G8929 Other chronic pain: Secondary | ICD-10-CM | POA: Diagnosis not present

## 2017-03-29 NOTE — Telephone Encounter (Signed)
Okay to print out and put on my desk for signature.

## 2017-03-29 NOTE — Telephone Encounter (Signed)
Pt left v/m requesting rx hydrocodone apap. Call when ready for pick up. Last printed # 60 on 02/16/17; last seen 03/01/17. Pt had knee surgery few weeks ago and ortho gave hydrocodone 7 day supply; pt has not gotten any more from ortho. Pain level still  Bothering pt and pt is having to go to PT.

## 2017-03-30 NOTE — Telephone Encounter (Signed)
Pt left v/m requesting cb about status of hydrocodone apap. 

## 2017-03-31 DIAGNOSIS — M542 Cervicalgia: Secondary | ICD-10-CM | POA: Diagnosis not present

## 2017-03-31 MED ORDER — HYDROCODONE-ACETAMINOPHEN 7.5-325 MG PO TABS: 1.0000 | ORAL_TABLET | Freq: Two times a day (BID) | ORAL | 0 refills | Status: DC | PRN

## 2017-03-31 MED FILL — HYDROCODON-APAP 7.5-325: 7.5-325 | 30 days supply | Qty: 60 | Fill #0

## 2017-03-31 NOTE — Telephone Encounter (Signed)
LMOM on patient phone Rx is ready to be picked up in front office

## 2017-03-31 NOTE — Telephone Encounter (Signed)
Pt called to ck on status of refill. She is requesting a call when ready.

## 2017-04-01 DIAGNOSIS — M25561 Pain in right knee: Secondary | ICD-10-CM | POA: Diagnosis not present

## 2017-04-01 DIAGNOSIS — Z9889 Other specified postprocedural states: Secondary | ICD-10-CM | POA: Diagnosis not present

## 2017-04-01 DIAGNOSIS — G8929 Other chronic pain: Secondary | ICD-10-CM | POA: Diagnosis not present

## 2017-04-04 ENCOUNTER — Other Ambulatory Visit: Payer: Self-pay | Admitting: Family Medicine

## 2017-04-05 ENCOUNTER — Other Ambulatory Visit: Payer: Self-pay | Admitting: Family Medicine

## 2017-04-05 DIAGNOSIS — Z9889 Other specified postprocedural states: Secondary | ICD-10-CM | POA: Diagnosis not present

## 2017-04-05 DIAGNOSIS — G8929 Other chronic pain: Secondary | ICD-10-CM | POA: Diagnosis not present

## 2017-04-05 DIAGNOSIS — M25561 Pain in right knee: Secondary | ICD-10-CM | POA: Diagnosis not present

## 2017-04-05 NOTE — Telephone Encounter (Signed)
Last refill 02/25/17  Last OV 03/01/17  Ok to refill?

## 2017-04-05 NOTE — Telephone Encounter (Signed)
[id number] [address] [address], [place]: [phone]

## 2017-04-12 DIAGNOSIS — M25561 Pain in right knee: Secondary | ICD-10-CM | POA: Diagnosis not present

## 2017-04-12 DIAGNOSIS — G8929 Other chronic pain: Secondary | ICD-10-CM | POA: Diagnosis not present

## 2017-04-12 DIAGNOSIS — Z9889 Other specified postprocedural states: Secondary | ICD-10-CM | POA: Diagnosis not present

## 2017-04-15 DIAGNOSIS — Z9889 Other specified postprocedural states: Secondary | ICD-10-CM | POA: Diagnosis not present

## 2017-04-15 DIAGNOSIS — G8929 Other chronic pain: Secondary | ICD-10-CM | POA: Diagnosis not present

## 2017-04-15 DIAGNOSIS — M25561 Pain in right knee: Secondary | ICD-10-CM | POA: Diagnosis not present

## 2017-04-19 DIAGNOSIS — M25561 Pain in right knee: Secondary | ICD-10-CM | POA: Diagnosis not present

## 2017-04-19 DIAGNOSIS — Z9889 Other specified postprocedural states: Secondary | ICD-10-CM | POA: Diagnosis not present

## 2017-04-19 DIAGNOSIS — G8929 Other chronic pain: Secondary | ICD-10-CM | POA: Diagnosis not present

## 2017-04-22 DIAGNOSIS — R21 Rash and other nonspecific skin eruption: Secondary | ICD-10-CM | POA: Diagnosis not present

## 2017-04-26 DIAGNOSIS — G8929 Other chronic pain: Secondary | ICD-10-CM | POA: Diagnosis not present

## 2017-04-26 DIAGNOSIS — Z9889 Other specified postprocedural states: Secondary | ICD-10-CM | POA: Diagnosis not present

## 2017-04-26 DIAGNOSIS — M25561 Pain in right knee: Secondary | ICD-10-CM | POA: Diagnosis not present

## 2017-04-27 ENCOUNTER — Other Ambulatory Visit: Payer: Self-pay

## 2017-04-27 MED ORDER — HYDROCODONE-ACETAMINOPHEN 7.5-325 MG PO TABS: 1.0000 | ORAL_TABLET | Freq: Two times a day (BID) | ORAL | 0 refills | Status: DC | PRN

## 2017-04-27 NOTE — Telephone Encounter (Signed)
Script placed up front for pick up . Left voicemail advising on cell phone ready for pick up

## 2017-04-27 NOTE — Telephone Encounter (Signed)
Pt left v/m requesting rx hydrocodone apap. Call when ready for pick up. rx last printed # 60 on 03/31/17. Last seen 11/23/16.

## 2017-04-29 ENCOUNTER — Other Ambulatory Visit: Payer: Self-pay | Admitting: Family Medicine

## 2017-04-29 DIAGNOSIS — G8929 Other chronic pain: Secondary | ICD-10-CM | POA: Diagnosis not present

## 2017-04-29 DIAGNOSIS — Z9889 Other specified postprocedural states: Secondary | ICD-10-CM | POA: Diagnosis not present

## 2017-04-29 DIAGNOSIS — M25561 Pain in right knee: Secondary | ICD-10-CM | POA: Diagnosis not present

## 2017-04-29 MED FILL — HYDROCODON-APAP 7.5-325: 7.5-325 | 30 days supply | Qty: 60 | Fill #0

## 2017-05-02 NOTE — Telephone Encounter (Signed)
[id number] [place], [place]  [place] [address]  Phone: [phone] Fax: [phone]

## 2017-05-02 NOTE — Telephone Encounter (Signed)
Last refill 04/05/17 Last OV 03/01/17 Ok to refill?

## 2017-05-03 DIAGNOSIS — G8929 Other chronic pain: Secondary | ICD-10-CM | POA: Diagnosis not present

## 2017-05-03 DIAGNOSIS — Z9889 Other specified postprocedural states: Secondary | ICD-10-CM | POA: Diagnosis not present

## 2017-05-03 DIAGNOSIS — M25561 Pain in right knee: Secondary | ICD-10-CM | POA: Diagnosis not present

## 2017-05-10 DIAGNOSIS — M25561 Pain in right knee: Secondary | ICD-10-CM | POA: Diagnosis not present

## 2017-05-10 DIAGNOSIS — G8929 Other chronic pain: Secondary | ICD-10-CM | POA: Diagnosis not present

## 2017-05-10 DIAGNOSIS — Z9889 Other specified postprocedural states: Secondary | ICD-10-CM | POA: Diagnosis not present

## 2017-05-20 DIAGNOSIS — Z9889 Other specified postprocedural states: Secondary | ICD-10-CM | POA: Diagnosis not present

## 2017-05-20 DIAGNOSIS — G8929 Other chronic pain: Secondary | ICD-10-CM | POA: Diagnosis not present

## 2017-05-20 DIAGNOSIS — M25561 Pain in right knee: Secondary | ICD-10-CM | POA: Diagnosis not present

## 2017-05-24 ENCOUNTER — Other Ambulatory Visit: Payer: Self-pay | Admitting: Family Medicine

## 2017-05-24 DIAGNOSIS — Z9889 Other specified postprocedural states: Secondary | ICD-10-CM | POA: Diagnosis not present

## 2017-05-24 DIAGNOSIS — G8929 Other chronic pain: Secondary | ICD-10-CM | POA: Diagnosis not present

## 2017-05-24 DIAGNOSIS — M25561 Pain in right knee: Secondary | ICD-10-CM | POA: Diagnosis not present

## 2017-05-24 NOTE — Telephone Encounter (Signed)
Dr. Deborra Medina out of the office so flow coordinator routed to Dr. Glori Bickers, last filled on 04/27/17 #60 tabs with 0 refill

## 2017-05-24 NOTE — Telephone Encounter (Signed)
?   Too early for refill - let me know if I am wrong  What day does pt want to pick it up?

## 2017-05-24 NOTE — Telephone Encounter (Signed)
Copied from [place] #[id number]. Topic: Quick Communication - See Telephone Encounter >> May 24, 2017  4:16 PM Robina Ade, Helene Kelp D wrote: CRM for notification. See Telephone encounter for: 05/24/17. Patient needs refill on her Hydrocodone please call her when it's ready for her to pick the Rx up, thanks.

## 2017-05-24 NOTE — Addendum Note (Signed)
Addended by: Tammi Sou on: 05/24/2017 04:43 PM   Modules accepted: Orders

## 2017-05-26 MED ORDER — HYDROCODONE-ACETAMINOPHEN 7.5-325 MG PO TABS: 1.0000 | ORAL_TABLET | Freq: Two times a day (BID) | ORAL | 0 refills | Status: DC | PRN

## 2017-05-26 NOTE — Telephone Encounter (Signed)
LMOVM that Rx is ready at the front as AB signed in TA absence/she has a lab visit scheduled for 11.02.2018 and may P/U then/thx dmf

## 2017-05-26 NOTE — Telephone Encounter (Signed)
MT-According to last fill date today would make 30d/Plz advise thx dmf

## 2017-05-26 NOTE — Telephone Encounter (Signed)
She does need it refilled per date now but I am not in the office today-please send to someone who is there and can print it  Thanks

## 2017-05-27 ENCOUNTER — Ambulatory Visit (INDEPENDENT_AMBULATORY_CARE_PROVIDER_SITE_OTHER): Payer: Medicare Other

## 2017-05-27 DIAGNOSIS — Z23 Encounter for immunization: Secondary | ICD-10-CM | POA: Diagnosis not present

## 2017-05-27 MED FILL — HYDROCODON-APAP 7.5-325: 7.5-325 | 30 days supply | Qty: 60 | Fill #0

## 2017-06-21 ENCOUNTER — Telehealth: Payer: Self-pay | Admitting: Family Medicine

## 2017-06-21 NOTE — Telephone Encounter (Signed)
Spoke with pt. And will forward request for refill of hydrocodone. Also asks if she is due for lab work.

## 2017-06-21 NOTE — Telephone Encounter (Signed)
Per [place] controlled substance database, she is not due for another Rx till 06/26/2017. I will not be refill prior to that date.

## 2017-06-21 NOTE — Telephone Encounter (Signed)
CN-Dr. Deborra Medina has been prescribing this from 11.23.2012-10.30.2018/[facility] signed the last prescription While Dr. Deborra Medina was out of the office/Plz advise/thx dmf

## 2017-06-21 NOTE — Telephone Encounter (Signed)
Hydrocodone was never prescribed by Dr. Deborra Medina. I will not be authorizing this refill.

## 2017-06-21 NOTE — Telephone Encounter (Signed)
Copied from [place] [id number]. Topic: Quick Communication - See Telephone Encounter >> Jun 21, 2017 10:46 AM Corie Chiquito, NT wrote: CRM for notification. See Telephone encounter for: Patient called because she needs a refill on her Hydrocodone.If someone could give her a call back about this matter.  06/21/17.

## 2017-06-23 NOTE — Telephone Encounter (Signed)
Copied from [place] [id number]. Topic: Quick Communication - See Telephone Encounter >> Jun 21, 2017 10:46 AM Corie Chiquito, NT wrote: CRM for notification. See Telephone encounter for: Patient called because she needs a refill on her Hydrocodone.If someone could give her a call back about this matter. Advised she will be contacted when it's ready to be picked up.

## 2017-06-24 ENCOUNTER — Other Ambulatory Visit: Payer: Self-pay | Admitting: Family Medicine

## 2017-06-24 NOTE — Telephone Encounter (Signed)
Notified pt. That she would have to wait until Monday to pick up her Hydrocodone prescription. Verbalizes understanding.

## 2017-06-24 NOTE — Telephone Encounter (Signed)
no

## 2017-06-24 NOTE — Telephone Encounter (Signed)
CN-Do you want to fill the Hydrocodone with a note for "Fill Date: 12.02.2018" since that is on Sunday? Just asking as the patient has called again/thx dmf

## 2017-06-24 NOTE — Telephone Encounter (Signed)
Copied from [place] [id number]. Topic: Quick Communication - See Telephone Encounter >> Jun 24, 2017 12:24 PM Vernona Rieger wrote: CRM for notification. See Telephone encounter for:  Pt is calling for the update on the Hydrocodone. She said she only has one pill left. I advised her that the Dr said She could not refill that yet. See resolved phone note.She just wants to pick up the script since today is Friday and just take it on up there to get them to fill it. Please call the pt. She is not understanding what I am advising her from previous phone note. [phone]  06/24/17.

## 2017-06-24 NOTE — Telephone Encounter (Signed)
Pt. Calling about Hydrocodone - if it could be filled 06/26/17 when it's due.Please give call back when ready.

## 2017-06-27 ENCOUNTER — Telehealth: Payer: Self-pay | Admitting: Family Medicine

## 2017-06-27 MED ORDER — HYDROCODONE-ACETAMINOPHEN 7.5-325 MG PO TABS: 1.0000 | ORAL_TABLET | Freq: Two times a day (BID) | ORAL | 0 refills | Status: DC | PRN

## 2017-06-27 MED FILL — HYDROCODON-APAP 7.5-325: 7.5-325 | 30 days supply | Qty: 60 | Fill #0

## 2017-06-27 NOTE — Telephone Encounter (Signed)
Yes okay to refill  

## 2017-06-27 NOTE — Addendum Note (Signed)
Addended by: Marrion Coy on: 06/27/2017 02:47 PM   Modules accepted: Orders

## 2017-06-27 NOTE — Telephone Encounter (Signed)
TA-Patient is calling the [facility] for refill of Hydrocodone/due date according to when [place] researched database is 12.02.2018/Plz advise if ok to print/thx dmf

## 2017-06-27 NOTE — Telephone Encounter (Signed)
See other phone note

## 2017-06-27 NOTE — Telephone Encounter (Signed)
Copied from [place]. Topic: Inquiry >> Jun 27, 2017  8:55 AM Cecelia Byars, NT wrote: Reason for CRM: Patient wants to know if she can come by and pick up her prescription for hydrocodone please call her [phone]

## 2017-06-27 NOTE — Telephone Encounter (Signed)
Printed per TA and is signed/called pt to inform/she is aware and will send her spouse to P/U/th dmf

## 2017-07-02 ENCOUNTER — Other Ambulatory Visit: Payer: Self-pay | Admitting: Family Medicine

## 2017-07-27 ENCOUNTER — Ambulatory Visit: Payer: Self-pay

## 2017-07-27 ENCOUNTER — Other Ambulatory Visit: Payer: Self-pay

## 2017-07-27 MED ORDER — HYDROCODONE-ACETAMINOPHEN 7.5-325 MG PO TABS: 1.0000 | ORAL_TABLET | Freq: Two times a day (BID) | ORAL | 0 refills | Status: DC | PRN

## 2017-07-27 NOTE — Telephone Encounter (Signed)
Printed and placed on TA's desk/will call when ready to P/U/thx dmf

## 2017-07-27 NOTE — Telephone Encounter (Signed)
Yes okay to print out and place on my desk for signature. Thank you!

## 2017-07-27 NOTE — Telephone Encounter (Signed)
Pt. Called to request to speak to Triage nurse.  Stated she needs a refill on her Hydrocodone-Acetaminophen 7.5mg /325mg .  Reported she continues to have right knee and leg pain.  Advised will send message to Dr. Marjory Lies.  Agreed with plan.

## 2017-07-27 NOTE — Telephone Encounter (Signed)
TA-Pt was last seen on 8.07.2018/continues to have right knee pain/is requesting Rx for Hydro-APAP/last given on 12.03.2018/plz advise if you would like for me to print/thx dmf

## 2017-07-27 NOTE — Progress Notes (Signed)
Printed per TA to sign/thx dmf

## 2017-07-28 MED FILL — HYDROCODON-APAP 7.5-325: 7.5-325 | 30 days supply | Qty: 60 | Fill #0

## 2017-07-28 NOTE — Telephone Encounter (Signed)
Pt will have husband to P/U Hydro-APAP Rx signed by TA/I wrote on envelope "Please schedule appointment with a provider to establish care at [facility].  After this prescription we will not be able to fill this here unless you schedule an appt with Dr. Aron."/Thx dmf

## 2017-08-02 ENCOUNTER — Other Ambulatory Visit: Payer: Self-pay | Admitting: Nurse Practitioner

## 2017-08-11 ENCOUNTER — Ambulatory Visit (INDEPENDENT_AMBULATORY_CARE_PROVIDER_SITE_OTHER): Payer: Medicare Other | Admitting: Family Medicine

## 2017-08-11 ENCOUNTER — Encounter: Payer: Self-pay | Admitting: Family Medicine

## 2017-08-11 VITALS — BP 112/66 | HR 60 | Temp 98.6°F | Ht 64.5 in | Wt 214.8 lb

## 2017-08-11 DIAGNOSIS — G8929 Other chronic pain: Secondary | ICD-10-CM | POA: Insufficient documentation

## 2017-08-11 DIAGNOSIS — I10 Essential (primary) hypertension: Secondary | ICD-10-CM

## 2017-08-11 MED ORDER — HYDROCODONE-ACETAMINOPHEN 7.5-325 MG PO TABS: 1.0000 | ORAL_TABLET | Freq: Two times a day (BID) | ORAL | 0 refills | Status: DC | PRN

## 2017-08-11 MED ORDER — ATENOLOL 25 MG PO TABS: 25.0000 mg | ORAL_TABLET | Freq: Every day | ORAL | 1 refills | Status: DC

## 2017-08-11 NOTE — Assessment & Plan Note (Signed)
Well controlled.  No changes made. 

## 2017-08-11 NOTE — Assessment & Plan Note (Signed)
Indication for chronic opioid: DDD Medication and dose: Norco 7.5- 325- twice daily as needed for severe pain # pills per month: 60 Last UDS date: 11/22/16 Pain contract signed (Y/N): Y Date narcotic database last reviewed (include red flags): 08/11/17

## 2017-08-11 NOTE — Patient Instructions (Signed)
Great to see you!   

## 2017-08-11 NOTE — Progress Notes (Signed)
Subjective:   Patient ID: Heather Shaw, female    DOB: 11/16/1940, 77 y.o.   MRN: [id number]  Heather Shaw is a pleasant 77 y.o. year old female who presents to clinic today with Hypertension (Patient is here today to F/U with HTN.  She states that her BP has been doing ok with only occasional spikes.  Takes meds as prescribed.  She is needing a refill of the Atenolol.  She is not currently fasting.) and Pain (Patient is also here to F/U with chronic wide spread pain.  She states that she has been to therapy for her right leg and nee but it has not been helpful.  Surgery was performed on that knee.  She has DDD and Lumbar Radiculopathy.  She takes Hydrocodone which is most helpful and uses Tramadol as needed. )  on 08/11/2017  HPI:  HTN- also followed by cardiology.  Currently taking HCTZ 25 mg daily, along with Atenolol and Norvasc 10 mg daily. Denies HA, blurred vision, CP, SOB or LE edema.  Lab Results  Component Value Date   CREATININE 0.69 11/08/2016   Chronic pain- hip, back (DDD) and right knee. Has seen multiple orthopedists. Take tramadol and Norco. Tries not to take Norco except at night.   Recent knee surgery.  Indication for chronic opioid: DDD Medication and dose: Norco 7.5- 325- twice daily as needed for severe pain # pills per month: 60 Last UDS date: 11/22/16 Pain contract signed (Y/N): Y Date narcotic database last reviewed (include red flags): 08/11/17   Current Outpatient Medications on File Prior to Visit  Medication Sig Dispense Refill  . amLODipine (NORVASC) 10 MG tablet TAKE 1 TABLET BY MOUTH EVERY DAY 90 tablet 2  . Fluocinonide Emulsified Base 0.05 % CREA APPLY 1 APPLICATION TOPICALLY 2 (TWO) TIMES DAILY. 30 g 0  . hydrochlorothiazide (HYDRODIURIL) 25 MG tablet TAKE 1 TABLET BY MOUTH EVERY DAY 90 tablet 1  . magnesium hydroxide (MILK OF MAGNESIA) 400 MG/5ML suspension Take by mouth daily as needed for mild constipation.    . meclizine  (ANTIVERT) 12.5 MG tablet Take 1 tablet (12.5 mg total) by mouth 3 (three) times daily as needed for dizziness. 30 tablet 0  . Meth-Hyo-M Bl-Na Phos-Ph Sal (URO-MP) 118 MG CAPS Take 1 capsule by mouth 3 (three) times daily as needed.  3  . polyethylene glycol powder (GLYCOLAX/MIRALAX) powder Dissolve 17 grams in at least 8 ounces water/juice and drink once daily 527 g 3  . traMADol (ULTRAM) 50 MG tablet TAKE 1 TABLET BY MOUTH EVERY 6 HOURS AS NEEDED FOR PAIN 60 tablet 2   No current facility-administered medications on file prior to visit.     Allergies  Allergen Reactions  . Contrast Media [Iodinated Diagnostic Agents]     ? angioedema  . Lisinopril     ? angioedema  . Allegra [Fexofenadine] Other (See Comments)    Makes pt nervous  . Codeine Nausea And Vomiting  . Prednisone     Causes elevation in blood pressure- ? All steroids cause same reaction  . Zithromax [Azithromycin] Other (See Comments)    Burns stomach  . Sulfa Antibiotics Rash    Past Medical History:  Diagnosis Date  . Arthritis   . Bladder pain   . Bradycardia   . DDD (degenerative disc disease), lumbosacral   . Hyperlipidemia   . Hyperplastic colon polyp   . Hypertension   . IC (interstitial cystitis)   . Internal hemorrhoids   .  Polyp of rectum   . PONV (postoperative nausea and vomiting)   . Squamous papilloma    in esophageal polyp  . Uterine fibroid   . Vertigo   . Wears glasses   . Wears partial dentures     Past Surgical History:  Procedure Laterality Date  . COLONOSCOPY W/ POLYPECTOMY  2009  . CYSTO WITH HYDRODISTENSION N/A 05/20/2014   Procedure: CYSTOSCOPY/HYDRODISTENSION, INSTILLATION OF CHLORPACTIN;  Surgeon: Bernestine Amass, MD;  Location: [facility];  Service: Urology;  Laterality: N/A;  . CYSTO/  HYDRODISTENTION/  INSTILLATION CLORPACTIC  09-21-2010  . DILATION AND CURETTAGE OF UTERUS  1988  . KNEE ARTHROSCOPY Right 11-18-2009  . TRIGGER FINGER RELEASE Right 08/23/2016     Procedure: RIGHT RING FINGER TRIGGER RELEASE;  Surgeon: Milly Jakob, MD;  Location: [facility];  Service: Orthopedics;  Laterality: Right;    Family History  Problem Relation Age of Onset  . Clotting disorder Mother 76  . COPD Father   . Heart attack Father 24  . Emphysema Father   . Stroke Paternal Aunt   . Hypertension Sister   . Hypertension Brother     Social History   Socioeconomic History  . Marital status: Married    Spouse name: Not on file  . Number of children: 6  . Years of education: Not on file  . Highest education level: Not on file  Social Needs  . Financial resource strain: Not on file  . Food insecurity - worry: Not on file  . Food insecurity - inability: Not on file  . Transportation needs - medical: Not on file  . Transportation needs - non-medical: Not on file  Occupational History  . Occupation: housewife  Tobacco Use  . Smoking status: Former Smoker    Types: Cigarettes    Last attempt to quit: 05/17/1971    Years since quitting: 46.2  . Smokeless tobacco: Never Used  Substance and Sexual Activity  . Alcohol use: No    Alcohol/week: 0.0 oz  . Drug use: No  . Sexual activity: No  Other Topics Concern  . Not on file  Social History Narrative  . Not on file   The PMH, PSH, Social History, Family History, Medications, and allergies have been reviewed in [facility], and have been updated if relevant.   Review of Systems  Constitutional: Negative.   HENT: Negative.   Eyes: Negative.   Respiratory: Negative.   Cardiovascular: Negative.   Gastrointestinal: Negative.   Endocrine: Negative.   Genitourinary: Negative.   Musculoskeletal: Positive for arthralgias, back pain, gait problem and joint swelling.  Skin: Negative.   Allergic/Immunologic: Negative.   Hematological: Negative.   Psychiatric/Behavioral: Negative.   All other systems reviewed and are negative.      Objective:    BP 112/66 (BP Location: Left Arm, Patient  Position: Sitting, Cuff Size: Large)   Pulse 60   Temp 98.6 F (37 C) (Oral)   Ht 5' 4.5" (1.638 m)   Wt 214 lb 12.8 oz (97.4 kg)   SpO2 97%   BMI 36.30 kg/m    Physical Exam   General:  Well-developed,well-nourished,in no acute distress; alert,appropriate and cooperative throughout examination Head:  normocephalic and atraumatic.   Eyes:  vision grossly intact, PERRL Ears:  R ear normal and L ear normal externally, TMs clear bilaterally Nose:  no external deformity.   Mouth:  good dentition.   Neck:  No deformities, masses, or tenderness noted. Lungs:  Normal respiratory  effort, chest expands symmetrically. Lungs are clear to auscultation, no crackles or wheezes. Heart:  Normal rate and regular rhythm. S1 and S2 normal without gallop, murmur, click, rub or other extra sounds. Msk:  No deformity or scoliosis noted of thoracic or lumbar spine.   Extremities:  No clubbing, cyanosis, edema, or deformity noted with normal full range of motion of all joints.   Neurologic:  alert & oriented X3 and gait normal.   Skin:  Intact without suspicious lesions or rashes Psych:  Cognition and judgment appear intact. Alert and cooperative with normal attention span and concentration. No apparent delusions, illusions, hallucinations       Assessment & Plan:   Essential hypertension  Other chronic pain No Follow-up on file.

## 2017-08-24 ENCOUNTER — Ambulatory Visit (INDEPENDENT_AMBULATORY_CARE_PROVIDER_SITE_OTHER): Payer: Medicare Other | Admitting: Family Medicine

## 2017-08-24 ENCOUNTER — Encounter: Payer: Self-pay | Admitting: Family Medicine

## 2017-08-24 DIAGNOSIS — M778 Other enthesopathies, not elsewhere classified: Secondary | ICD-10-CM | POA: Diagnosis not present

## 2017-08-24 HISTORY — DX: Other enthesopathies, not elsewhere classified: M77.8

## 2017-08-24 NOTE — Progress Notes (Signed)
Subjective:   Patient ID: Heather Shaw, female    DOB: November 20, 1940, 77 y.o.   MRN: [id number]  Heather Shaw is a pleasant 77 y.o. year old female who presents to clinic today with Wrist Pain (Patient is here today C/O right wrist pain.  Pain started 3 weeks ago and denies any injury.  There is some heat and swelling.   She describes as burning inside of it and very painful when she picks up anything.  Does not increase the pain when she movesit though.)  on 08/24/2017  HPI:  Right wrist has become very painful for past 3 weeks. Denies any injury. Feels it is swollen.  Turning doorknob is the most painful.  Has not tried anything for it.  Has never had anything like this before.  H/o bilateral carpal tunnel- both have been surgically repaired.   Current Outpatient Medications on File Prior to Visit  Medication Sig Dispense Refill  . amLODipine (NORVASC) 10 MG tablet TAKE 1 TABLET BY MOUTH EVERY DAY 90 tablet 2  . atenolol (TENORMIN) 25 MG tablet Take 1 tablet (25 mg total) by mouth daily. 90 tablet 1  . Fluocinonide Emulsified Base 0.05 % CREA APPLY 1 APPLICATION TOPICALLY 2 (TWO) TIMES DAILY. 30 g 0  . hydrochlorothiazide (HYDRODIURIL) 25 MG tablet TAKE 1 TABLET BY MOUTH EVERY DAY 90 tablet 1  . HYDROcodone-acetaminophen (NORCO) 7.5-325 MG tablet Take 1 tablet by mouth 2 (two) times daily as needed for moderate pain (knee pain). 60 tablet 0  . magnesium hydroxide (MILK OF MAGNESIA) 400 MG/5ML suspension Take by mouth daily as needed for mild constipation.    . meclizine (ANTIVERT) 12.5 MG tablet Take 1 tablet (12.5 mg total) by mouth 3 (three) times daily as needed for dizziness. 30 tablet 0  . Meth-Hyo-M Bl-Na Phos-Ph Sal (URO-MP) 118 MG CAPS Take 1 capsule by mouth 3 (three) times daily as needed.  3  . polyethylene glycol powder (GLYCOLAX/MIRALAX) powder Dissolve 17 grams in at least 8 ounces water/juice and drink once daily 527 g 3  . traMADol (ULTRAM) 50 MG tablet TAKE 1  TABLET BY MOUTH EVERY 6 HOURS AS NEEDED FOR PAIN 60 tablet 2   No current facility-administered medications on file prior to visit.     Allergies  Allergen Reactions  . Contrast Media [Iodinated Diagnostic Agents]     ? angioedema  . Lisinopril     ? angioedema  . Allegra [Fexofenadine] Other (See Comments)    Makes pt nervous  . Codeine Nausea And Vomiting  . Prednisone     Causes elevation in blood pressure- ? All steroids cause same reaction  . Zithromax [Azithromycin] Other (See Comments)    Burns stomach  . Sulfa Antibiotics Rash    Past Medical History:  Diagnosis Date  . Arthritis   . Bladder pain   . Bradycardia   . DDD (degenerative disc disease), lumbosacral   . Hyperlipidemia   . Hyperplastic colon polyp   . Hypertension   . IC (interstitial cystitis)   . Internal hemorrhoids   . Polyp of rectum   . PONV (postoperative nausea and vomiting)   . Squamous papilloma    in esophageal polyp  . Uterine fibroid   . Vertigo   . Wears glasses   . Wears partial dentures     Past Surgical History:  Procedure Laterality Date  . COLONOSCOPY W/ POLYPECTOMY  2009  . CYSTO WITH HYDRODISTENSION N/A 05/20/2014   Procedure: CYSTOSCOPY/HYDRODISTENSION, INSTILLATION  OF CHLORPACTIN;  Surgeon: Bernestine Amass, MD;  Location: [facility];  Service: Urology;  Laterality: N/A;  . CYSTO/  HYDRODISTENTION/  INSTILLATION CLORPACTIC  09-21-2010  . DILATION AND CURETTAGE OF UTERUS  1988  . KNEE ARTHROSCOPY Right 11-18-2009  . TRIGGER FINGER RELEASE Right 08/23/2016   Procedure: RIGHT RING FINGER TRIGGER RELEASE;  Surgeon: Milly Jakob, MD;  Location: [place];  Service: Orthopedics;  Laterality: Right;    Family History  Problem Relation Age of Onset  . Clotting disorder Mother 28  . COPD Father   . Heart attack Father 73  . Emphysema Father   . Stroke Paternal Aunt   . Hypertension Sister   . Hypertension Brother     Social History    Socioeconomic History  . Marital status: Married    Spouse name: Not on file  . Number of children: 6  . Years of education: Not on file  . Highest education level: Not on file  Social Needs  . Financial resource strain: Not on file  . Food insecurity - worry: Not on file  . Food insecurity - inability: Not on file  . Transportation needs - medical: Not on file  . Transportation needs - non-medical: Not on file  Occupational History  . Occupation: housewife  Tobacco Use  . Smoking status: Former Smoker    Types: Cigarettes    Last attempt to quit: 05/17/1971    Years since quitting: 46.3  . Smokeless tobacco: Never Used  Substance and Sexual Activity  . Alcohol use: No    Alcohol/week: 0.0 oz  . Drug use: No  . Sexual activity: No  Other Topics Concern  . Not on file  Social History Narrative  . Not on file   The PMH, PSH, Social History, Family History, Medications, and allergies have been reviewed in [facility], and have been updated if relevant.   Review of Systems  Musculoskeletal: Positive for myalgias.  Neurological: Negative for weakness.  All other systems reviewed and are negative.      Objective:    BP 132/82 (BP Location: Left Arm, Patient Position: Sitting, Cuff Size: Normal)   Pulse (!) 51   Temp 97.9 F (36.6 C) (Oral)   Ht 5' 4.5" (1.638 m)   Wt 218 lb 9.6 oz (99.2 kg)   SpO2 97%   BMI 36.94 kg/m    Physical Exam  Constitutional: She is oriented to person, place, and time. She appears well-developed and well-nourished. No distress.  HENT:  Head: Normocephalic and atraumatic.  Eyes: Conjunctivae are normal.  Cardiovascular: Normal rate.  Pulmonary/Chest: Effort normal.  Musculoskeletal:       Right wrist: She exhibits decreased range of motion, tenderness and swelling. She exhibits no bony tenderness, no effusion, no crepitus, no deformity and no laceration.  Neurological: She is alert and oriented to person, place, and time. No cranial nerve  deficit.  Skin: Skin is dry. She is not diaphoretic.  Psychiatric: She has a normal mood and affect. Her behavior is normal. Judgment and thought content normal.  Nursing note and vitals reviewed.         Assessment & Plan:   Right wrist tendonitis No Follow-up on file.

## 2017-08-24 NOTE — Assessment & Plan Note (Signed)
Advised using her wrist splint. Pennsaid samples given. Given exercises from patient sports med advisor. Call or return to clinic prn if these symptoms worsen or fail to improve as anticipated. The patient indicates understanding of these issues and agrees with the plan.

## 2017-08-26 MED FILL — HYDROCODON-APAP 7.5-325: 7.5-325 | 30 days supply | Qty: 60 | Fill #0

## 2017-09-08 DIAGNOSIS — M654 Radial styloid tenosynovitis [de Quervain]: Secondary | ICD-10-CM | POA: Diagnosis not present

## 2017-09-22 ENCOUNTER — Other Ambulatory Visit: Payer: Self-pay | Admitting: Family Medicine

## 2017-09-22 MED ORDER — TRAMADOL HCL 50 MG PO TABS: 50.0000 mg | ORAL_TABLET | Freq: Four times a day (QID) | ORAL | 2 refills | Status: DC | PRN

## 2017-09-22 NOTE — Addendum Note (Signed)
Addended by: Kateri Mc E on: 09/22/2017 04:45 PM   Modules accepted: Orders

## 2017-09-22 NOTE — Telephone Encounter (Signed)
Rx last filled 08/18/17

## 2017-09-23 MED FILL — HYDROCODON-APAP 7.5-325: 7.5-325 | 30 days supply | Qty: 60 | Fill #0

## 2017-10-05 DIAGNOSIS — M654 Radial styloid tenosynovitis [de Quervain]: Secondary | ICD-10-CM | POA: Diagnosis not present

## 2017-10-06 ENCOUNTER — Other Ambulatory Visit: Payer: Self-pay | Admitting: Family Medicine

## 2017-10-06 DIAGNOSIS — R351 Nocturia: Secondary | ICD-10-CM | POA: Diagnosis not present

## 2017-10-06 DIAGNOSIS — Z1231 Encounter for screening mammogram for malignant neoplasm of breast: Secondary | ICD-10-CM | POA: Diagnosis not present

## 2017-10-06 DIAGNOSIS — Z6836 Body mass index (BMI) 36.0-36.9, adult: Secondary | ICD-10-CM | POA: Diagnosis not present

## 2017-10-06 DIAGNOSIS — Z124 Encounter for screening for malignant neoplasm of cervix: Secondary | ICD-10-CM | POA: Diagnosis not present

## 2017-10-06 DIAGNOSIS — R102 Pelvic and perineal pain: Secondary | ICD-10-CM | POA: Diagnosis not present

## 2017-10-11 ENCOUNTER — Ambulatory Visit (INDEPENDENT_AMBULATORY_CARE_PROVIDER_SITE_OTHER): Payer: Medicare Other | Admitting: Family Medicine

## 2017-10-11 ENCOUNTER — Encounter: Payer: Self-pay | Admitting: Family Medicine

## 2017-10-11 VITALS — BP 134/90 | HR 62 | Temp 99.8°F | Ht 64.5 in | Wt 213.0 lb

## 2017-10-11 DIAGNOSIS — R509 Fever, unspecified: Secondary | ICD-10-CM | POA: Diagnosis not present

## 2017-10-11 DIAGNOSIS — R070 Pain in throat: Secondary | ICD-10-CM

## 2017-10-11 LAB — POC INFLUENZA A&B (BINAX/QUICKVUE)
Influenza A, POC: NEGATIVE
Influenza B, POC: NEGATIVE

## 2017-10-11 LAB — POCT RAPID STREP A (OFFICE): RAPID STREP A SCREEN: NEGATIVE

## 2017-10-11 MED ORDER — HYDROCODONE-HOMATROPINE 5-1.5 MG/5ML PO SYRP
5.0000 mL | ORAL_SOLUTION | Freq: Three times a day (TID) | ORAL | 0 refills | Status: DC | PRN
Start: 2017-10-11 — End: 2017-10-25

## 2017-10-11 MED ORDER — MECLIZINE HCL 12.5 MG PO TABS: 12.5000 mg | ORAL_TABLET | Freq: Three times a day (TID) | ORAL | 0 refills | Status: DC | PRN

## 2017-10-11 MED ORDER — AMOXICILLIN 875 MG PO TABS
875.0000 mg | ORAL_TABLET | Freq: Two times a day (BID) | ORAL | 0 refills | Status: DC
Start: 2017-10-11 — End: 2017-10-25

## 2017-10-11 NOTE — Patient Instructions (Signed)
Please take Amoxicillin as directed, Hycodan as needed for severe cough.

## 2017-10-11 NOTE — Progress Notes (Signed)
poc

## 2017-10-11 NOTE — Progress Notes (Signed)
SUBJECTIVE:  Heather Shaw is a 77 y.o. female who complains of coryza, congestion, dry cough and productive cough for 3 days. She denies a history of anorexia, chest pain, chills, dizziness and fevers and denies a history of asthma. Patient denies smoke cigarettes.    Current Outpatient Medications on File Prior to Visit  Medication Sig Dispense Refill  . amLODipine (NORVASC) 10 MG tablet TAKE 1 TABLET BY MOUTH EVERY DAY 90 tablet 1  . atenolol (TENORMIN) 25 MG tablet Take 1 tablet (25 mg total) by mouth daily. 90 tablet 1  . Fluocinonide Emulsified Base 0.05 % CREA APPLY 1 APPLICATION TOPICALLY 2 (TWO) TIMES DAILY. 30 g 0  . hydrochlorothiazide (HYDRODIURIL) 25 MG tablet TAKE 1 TABLET BY MOUTH EVERY DAY 90 tablet 1  . HYDROcodone-acetaminophen (NORCO) 7.5-325 MG tablet Take 1 tablet by mouth 2 (two) times daily as needed for moderate pain (knee pain). 60 tablet 0  . magnesium hydroxide (MILK OF MAGNESIA) 400 MG/5ML suspension Take by mouth daily as needed for mild constipation.    . meclizine (ANTIVERT) 12.5 MG tablet Take 1 tablet (12.5 mg total) by mouth 3 (three) times daily as needed for dizziness. 30 tablet 0  . Meth-Hyo-M Bl-Na Phos-Ph Sal (URO-MP) 118 MG CAPS Take 1 capsule by mouth 3 (three) times daily as needed.  3  . polyethylene glycol powder (GLYCOLAX/MIRALAX) powder Dissolve 17 grams in at least 8 ounces water/juice and drink once daily 527 g 3  . traMADol (ULTRAM) 50 MG tablet Take 1 tablet (50 mg total) by mouth every 6 (six) hours as needed. for pain 60 tablet 2   No current facility-administered medications on file prior to visit.     Allergies  Allergen Reactions  . Contrast Media [Iodinated Diagnostic Agents]     ? angioedema  . Lisinopril     ? angioedema  . Allegra [Fexofenadine] Other (See Comments)    Makes pt nervous  . Codeine Nausea And Vomiting  . Prednisone     Causes elevation in blood pressure- ? All steroids cause same reaction  . Zithromax  [Azithromycin] Other (See Comments)    Burns stomach  . Sulfa Antibiotics Rash    Past Medical History:  Diagnosis Date  . Arthritis   . Bladder pain   . Bradycardia   . DDD (degenerative disc disease), lumbosacral   . Hyperlipidemia   . Hyperplastic colon polyp   . Hypertension   . IC (interstitial cystitis)   . Internal hemorrhoids   . Polyp of rectum   . PONV (postoperative nausea and vomiting)   . Squamous papilloma    in esophageal polyp  . Uterine fibroid   . Vertigo   . Wears glasses   . Wears partial dentures     Past Surgical History:  Procedure Laterality Date  . COLONOSCOPY W/ POLYPECTOMY  2009  . CYSTO WITH HYDRODISTENSION N/A 05/20/2014   Procedure: CYSTOSCOPY/HYDRODISTENSION, INSTILLATION OF CHLORPACTIN;  Surgeon: Bernestine Amass, MD;  Location: [facility];  Service: Urology;  Laterality: N/A;  . CYSTO/  HYDRODISTENTION/  INSTILLATION CLORPACTIC  09-21-2010  . DILATION AND CURETTAGE OF UTERUS  1988  . KNEE ARTHROSCOPY Right 11-18-2009  . TRIGGER FINGER RELEASE Right 08/23/2016   Procedure: RIGHT RING FINGER TRIGGER RELEASE;  Surgeon: Milly Jakob, MD;  Location: [place];  Service: Orthopedics;  Laterality: Right;    Family History  Problem Relation Age of Onset  . Clotting disorder Mother 81  . COPD Father   .  Heart attack Father 71  . Emphysema Father   . Stroke Paternal Aunt   . Hypertension Sister   . Hypertension Brother     Social History   Socioeconomic History  . Marital status: Married    Spouse name: Not on file  . Number of children: 6  . Years of education: Not on file  . Highest education level: Not on file  Social Needs  . Financial resource strain: Not on file  . Food insecurity - worry: Not on file  . Food insecurity - inability: Not on file  . Transportation needs - medical: Not on file  . Transportation needs - non-medical: Not on file  Occupational History  . Occupation: housewife   Tobacco Use  . Smoking status: Former Smoker    Types: Cigarettes    Last attempt to quit: 05/17/1971    Years since quitting: 46.4  . Smokeless tobacco: Never Used  Substance and Sexual Activity  . Alcohol use: No    Alcohol/week: 0.0 oz  . Drug use: No  . Sexual activity: No  Other Topics Concern  . Not on file  Social History Narrative  . Not on file    OBJECTIVE: BP 134/90 (BP Location: Left Arm, Patient Position: Sitting, Cuff Size: Normal)   Pulse 62   Temp 99.8 F (37.7 C) (Oral)   Ht 5' 4.5" (1.638 m)   Wt 213 lb (96.6 kg)   SpO2 97%   BMI 36.00 kg/m   She appears well, vital signs are as noted. Ears normal.  Throat and pharynx normal.  Neck supple. No adenopathy in the neck. Nose is congested. Sinuses non tender. Right basilar wheezes and crackles.  ASSESSMENT:  pneumonia  PLAN: Rapid strep and rapid flu neg. Zpack, hycodan as needed for severe cough. Symptomatic therapy suggested: push fluids, rest and return office visit prn if symptoms persist or worsenCall or return to clinic prn if these symptoms worsen or fail to improve as anticipated.

## 2017-10-19 ENCOUNTER — Telehealth: Payer: Self-pay

## 2017-10-19 ENCOUNTER — Telehealth: Payer: Self-pay | Admitting: Family Medicine

## 2017-10-19 ENCOUNTER — Other Ambulatory Visit: Payer: Self-pay

## 2017-10-19 ENCOUNTER — Other Ambulatory Visit: Payer: Self-pay | Admitting: Family Medicine

## 2017-10-19 NOTE — Telephone Encounter (Unsigned)
Copied from [place]. Topic: Quick Communication - See Telephone Encounter >> Oct 19, 2017 10:25 AM Bea Graff, NT wrote: CRM for notification. See Telephone encounter for: 10/19/17. Pt calling to see if she can have a refill of the amoxicillin. States she still does not feel better.

## 2017-10-19 NOTE — Telephone Encounter (Signed)
LMOVM for pt to RTC/I need to know if she started feeling better while she was on the Amoxicillin and then started getting worse again/thx dmf   Copied from [place]. Topic: Quick Communication - See Telephone Encounter >> Oct 19, 2017 10:25 AM Bea Graff, NT wrote: CRM for notification. See Telephone encounter for: 10/19/17. Pt calling to see if she can have a refill of the amoxicillin. States she still does not feel better.

## 2017-10-19 NOTE — Telephone Encounter (Signed)
There is already a telephone encounter with this information awaiting pt response/need to know if she had started feeling any better while she was on the Amoxicillin and then started to worsen again/then I will get message to TA/thx dmf

## 2017-10-20 NOTE — Telephone Encounter (Signed)
LMOVM stating TA's response/thx dmf 

## 2017-10-20 NOTE — Telephone Encounter (Signed)
The abx will still be in her symptoms for several more days.  Please advise to give it a little more time and to keep [place] updated with her symptoms.

## 2017-10-20 NOTE — Telephone Encounter (Signed)
TA-Today is her last dosing of Amox/she states that things are just starting to break up but feels that she needs another round of it to get rid of it/plz advise/thx dmf

## 2017-10-21 MED FILL — HYDROCODON-APAP 7.5-325: 7.5-325 | 30 days supply | Qty: 60 | Fill #0

## 2017-10-25 ENCOUNTER — Ambulatory Visit (INDEPENDENT_AMBULATORY_CARE_PROVIDER_SITE_OTHER): Payer: Medicare Other | Admitting: Family Medicine

## 2017-10-25 ENCOUNTER — Encounter: Payer: Self-pay | Admitting: Family Medicine

## 2017-10-25 DIAGNOSIS — R053 Chronic cough: Secondary | ICD-10-CM | POA: Insufficient documentation

## 2017-10-25 DIAGNOSIS — R05 Cough: Secondary | ICD-10-CM

## 2017-10-25 MED ORDER — ALBUTEROL SULFATE HFA 108 (90 BASE) MCG/ACT IN AERS: 2.0000 | INHALATION_SPRAY | Freq: Four times a day (QID) | RESPIRATORY_TRACT | 0 refills | Status: DC | PRN

## 2017-10-25 MED ORDER — HYDROCODONE-HOMATROPINE 5-1.5 MG/5ML PO SYRP: 5.0000 mL | ORAL_SOLUTION | Freq: Three times a day (TID) | ORAL | 0 refills | Status: DC | PRN

## 2017-10-25 NOTE — Progress Notes (Signed)
Subjective:   Patient ID: Heather Shaw, female    DOB: 07-Jan-1941, 77 y.o.   MRN: [id number]  Heather Shaw is a pleasant 77 y.o. year old female who presents to clinic today with Follow-up (Patient is here today for a F/U.  On 3.19.19 she was Dx with Pneumonia and Tx with Amox 875mg  bid and Hydocan.  She states that she feels so weak, has coughing fits trying to get it up and when she starts it can't stop.  No appetite, post-nasal drip, cough is intermittently productive after a coughing fit and is yellow and stringy.)  on 10/25/2017  HPI:  URI followed up- overall, symptoms improved. Last dose of Amoxicillin was 10/21/17.   Feels she can't "break up" her cough. Chest feels tight and what she does cough up is still yellow and stringy.  No fevers or chills.    Current Outpatient Medications on File Prior to Visit  Medication Sig Dispense Refill  . amLODipine (NORVASC) 10 MG tablet TAKE 1 TABLET BY MOUTH EVERY DAY 90 tablet 1  . atenolol (TENORMIN) 25 MG tablet Take 1 tablet (25 mg total) by mouth daily. 90 tablet 1  . Fluocinonide Emulsified Base 0.05 % CREA APPLY 1 APPLICATION TOPICALLY 2 (TWO) TIMES DAILY. 30 g 0  . hydrochlorothiazide (HYDRODIURIL) 25 MG tablet TAKE 1 TABLET BY MOUTH EVERY DAY 90 tablet 1  . HYDROcodone-acetaminophen (NORCO) 7.5-325 MG tablet TAKE 1 TABLET BY MOUTH TWICE DAILY AS NEEDED FOR MODERATE PAIN (KNEE PAIN) 60 tablet 0  . magnesium hydroxide (MILK OF MAGNESIA) 400 MG/5ML suspension Take by mouth daily as needed for mild constipation.    . meclizine (ANTIVERT) 12.5 MG tablet Take 1 tablet (12.5 mg total) by mouth 3 (three) times daily as needed for dizziness. 30 tablet 0  . Meth-Hyo-M Bl-Na Phos-Ph Sal (URO-MP) 118 MG CAPS Take 1 capsule by mouth 3 (three) times daily as needed.  3  . polyethylene glycol powder (GLYCOLAX/MIRALAX) powder Dissolve 17 grams in at least 8 ounces water/juice and drink once daily 527 g 3  . traMADol (ULTRAM) 50 MG tablet  Take 1 tablet (50 mg total) by mouth every 6 (six) hours as needed. for pain 60 tablet 2   No current facility-administered medications on file prior to visit.     Allergies  Allergen Reactions  . Contrast Media [Iodinated Diagnostic Agents]     ? angioedema  . Lisinopril     ? angioedema  . Allegra [Fexofenadine] Other (See Comments)    Makes pt nervous  . Codeine Nausea And Vomiting  . Prednisone     Causes elevation in blood pressure- ? All steroids cause same reaction  . Zithromax [Azithromycin] Other (See Comments)    Burns stomach  . Sulfa Antibiotics Rash    Past Medical History:  Diagnosis Date  . Arthritis   . Bladder pain   . Bradycardia   . DDD (degenerative disc disease), lumbosacral   . Hyperlipidemia   . Hyperplastic colon polyp   . Hypertension   . IC (interstitial cystitis)   . Internal hemorrhoids   . Polyp of rectum   . PONV (postoperative nausea and vomiting)   . Squamous papilloma    in esophageal polyp  . Uterine fibroid   . Vertigo   . Wears glasses   . Wears partial dentures     Past Surgical History:  Procedure Laterality Date  . COLONOSCOPY W/ POLYPECTOMY  2009  . CYSTO WITH HYDRODISTENSION N/A 05/20/2014  Procedure: CYSTOSCOPY/HYDRODISTENSION, INSTILLATION OF CHLORPACTIN;  Surgeon: Bernestine Amass, MD;  Location: [facility];  Service: Urology;  Laterality: N/A;  . CYSTO/  HYDRODISTENTION/  INSTILLATION CLORPACTIC  09-21-2010  . DILATION AND CURETTAGE OF UTERUS  1988  . KNEE ARTHROSCOPY Right 11-18-2009  . TRIGGER FINGER RELEASE Right 08/23/2016   Procedure: RIGHT RING FINGER TRIGGER RELEASE;  Surgeon: Milly Jakob, MD;  Location: [place];  Service: Orthopedics;  Laterality: Right;    Family History  Problem Relation Age of Onset  . Clotting disorder Mother 63  . COPD Father   . Heart attack Father 3  . Emphysema Father   . Stroke Paternal Aunt   . Hypertension Sister   . Hypertension Brother       Social History   Socioeconomic History  . Marital status: Married    Spouse name: Not on file  . Number of children: 6  . Years of education: Not on file  . Highest education level: Not on file  Occupational History  . Occupation: housewife  Social Needs  . Financial resource strain: Not on file  . Food insecurity:    Worry: Not on file    Inability: Not on file  . Transportation needs:    Medical: Not on file    Non-medical: Not on file  Tobacco Use  . Smoking status: Former Smoker    Types: Cigarettes    Last attempt to quit: 05/17/1971    Years since quitting: 46.4  . Smokeless tobacco: Never Used  Substance and Sexual Activity  . Alcohol use: No    Alcohol/week: 0.0 oz  . Drug use: No  . Sexual activity: Never  Lifestyle  . Physical activity:    Days per week: Not on file    Minutes per session: Not on file  . Stress: Not on file  Relationships  . Social connections:    Talks on phone: Not on file    Gets together: Not on file    Attends religious service: Not on file    Active member of club or organization: Not on file    Attends meetings of clubs or organizations: Not on file    Relationship status: Not on file  . Intimate partner violence:    Fear of current or ex partner: Not on file    Emotionally abused: Not on file    Physically abused: Not on file    Forced sexual activity: Not on file  Other Topics Concern  . Not on file  Social History Narrative  . Not on file   The PMH, PSH, Social History, Family History, Medications, and allergies have been reviewed in [facility], and have been updated if relevant.   Review of Systems  Constitutional: Negative.   HENT: Positive for congestion.   Respiratory: Positive for cough, shortness of breath and wheezing.   Cardiovascular: Negative.   Neurological: Negative.   Hematological: Negative.   All other systems reviewed and are negative.      Objective:    BP 136/84 (BP Location: Left Arm, Patient  Position: Sitting, Cuff Size: Normal)   Pulse 96   Temp 98.5 F (36.9 C) (Oral)   Ht 5' 4.5" (1.638 m)   Wt 210 lb 6.4 oz (95.4 kg)   SpO2 95%   BMI 35.56 kg/m    Physical Exam  Constitutional: She is oriented to person, place, and time. She appears well-developed and well-nourished. No distress.  HENT:  Head: Normocephalic and  atraumatic.  Eyes: Conjunctivae are normal.  Cardiovascular: Normal rate and regular rhythm.  Pulmonary/Chest: Effort normal and breath sounds normal. No respiratory distress. She has no wheezes. She has no rales. She exhibits no tenderness.  Neurological: She is alert and oriented to person, place, and time. No cranial nerve deficit.  Skin: Skin is warm and dry. She is not diaphoretic.  Psychiatric: She has a normal mood and affect. Her behavior is normal. Judgment and thought content normal.  Nursing note and vitals reviewed.         Assessment & Plan:   Persistent cough No follow-ups on file.

## 2017-10-25 NOTE — Assessment & Plan Note (Signed)
Lung exam reassuring. eRx sent for proair as needed for wheezing, advised mucinex and pushing fluids. Call or return to clinic prn if these symptoms worsen or fail to improve as anticipated. The patient indicates understanding of these issues and agrees with the plan.

## 2017-10-25 NOTE — Patient Instructions (Signed)
Great to see you. Start taking Mucinex twice daily, drink a lot of water.  Proair inhaler as needed for wheezing.

## 2017-11-09 ENCOUNTER — Ambulatory Visit: Payer: Medicare Other

## 2017-11-14 ENCOUNTER — Ambulatory Visit: Payer: Medicare Other

## 2017-11-14 NOTE — Progress Notes (Signed)
Subjective:   Heather Shaw is a 77 y.o. female who presents for Medicare Annual (Subsequent) preventive examination.  Review of Systems: No ROS.  Medicare Wellness Visit. Additional risk factors are reflected in the social history.  Cardiac Risk Factors include: advanced age (>66men, >66 women);dyslipidemia;hypertension Sleep patterns:  Normally at least 7 hrs. Home Safety/Smoke Alarms: Feels safe in home. Smoke alarms in place.  Living environment; residence and Firearm Safety: 1 story home with husband.     Female:      Mammo- Dr.Neal-09/23/17- normal  Dexa scan- declines       CCS-2012  Objective:     Vitals: BP 140/86 (BP Location: Left Arm, Patient Position: Sitting, Cuff Size: Normal)   Pulse 77   Ht 5\' 5"  (1.651 m)   Wt 213 lb 6.4 oz (96.8 kg)   SpO2 97%   BMI 35.51 kg/m   Body mass index is 35.51 kg/m.  Advanced Directives 11/16/2017 11/08/2016 08/23/2016 08/19/2016 12/27/2015 12/27/2015 11/03/2015  Does Patient Have a Medical Advance Directive? No No No No No No Yes  Type of Advance Directive - - - - - - Living will  Does patient want to make changes to medical advance directive? - - - - - - No - Patient declined  Copy of [place] in Chart? - - - - - - No - copy requested  Would patient like information on creating a medical advance directive? No - Patient declined - No - Patient declined - No - patient declined information No - patient declined information -    Tobacco Social History   Tobacco Use  Smoking Status Former Smoker  . Types: Cigarettes  . Last attempt to quit: 05/17/1971  . Years since quitting: 46.5  Smokeless Tobacco Never Used     Counseling given: Not Answered   Clinical Intake: Pain : No/denies pain   Past Medical History:  Diagnosis Date  . Arthritis   . Bladder pain   . Bradycardia   . DDD (degenerative disc disease), lumbosacral   . Hyperlipidemia   . Hyperplastic colon polyp   . Hypertension   . IC  (interstitial cystitis)   . Internal hemorrhoids   . Polyp of rectum   . PONV (postoperative nausea and vomiting)   . Squamous papilloma    in esophageal polyp  . Uterine fibroid   . Vertigo   . Wears glasses   . Wears partial dentures    Past Surgical History:  Procedure Laterality Date  . COLONOSCOPY W/ POLYPECTOMY  2009  . CYSTO WITH HYDRODISTENSION N/A 05/20/2014   Procedure: CYSTOSCOPY/HYDRODISTENSION, INSTILLATION OF CHLORPACTIN;  Surgeon: Bernestine Amass, MD;  Location: [facility];  Service: Urology;  Laterality: N/A;  . CYSTO/  HYDRODISTENTION/  INSTILLATION CLORPACTIC  09-21-2010  . DILATION AND CURETTAGE OF UTERUS  1988  . KNEE ARTHROSCOPY Right 11-18-2009  . TRIGGER FINGER RELEASE Right 08/23/2016   Procedure: RIGHT RING FINGER TRIGGER RELEASE;  Surgeon: Milly Jakob, MD;  Location: [place];  Service: Orthopedics;  Laterality: Right;   Family History  Problem Relation Age of Onset  . Clotting disorder Mother 96  . COPD Father   . Heart attack Father 58  . Emphysema Father   . Stroke Paternal Aunt   . Hypertension Sister   . Hypertension Brother    Social History   Socioeconomic History  . Marital status: Married    Spouse name: Not on file  . Number of  children: 6  . Years of education: Not on file  . Highest education level: Not on file  Occupational History  . Occupation: housewife  Social Needs  . Financial resource strain: Not on file  . Food insecurity:    Worry: Not on file    Inability: Not on file  . Transportation needs:    Medical: Not on file    Non-medical: Not on file  Tobacco Use  . Smoking status: Former Smoker    Types: Cigarettes    Last attempt to quit: 05/17/1971    Years since quitting: 46.5  . Smokeless tobacco: Never Used  Substance and Sexual Activity  . Alcohol use: No    Alcohol/week: 0.0 oz  . Drug use: No  . Sexual activity: Never  Lifestyle  . Physical activity:    Days per week:  Not on file    Minutes per session: Not on file  . Stress: Not on file  Relationships  . Social connections:    Talks on phone: Not on file    Gets together: Not on file    Attends religious service: Not on file    Active member of club or organization: Not on file    Attends meetings of clubs or organizations: Not on file    Relationship status: Not on file  Other Topics Concern  . Not on file  Social History Narrative  . Not on file    Outpatient Encounter Medications as of 11/16/2017  Medication Sig  . amLODipine (NORVASC) 10 MG tablet TAKE 1 TABLET BY MOUTH EVERY DAY  . atenolol (TENORMIN) 25 MG tablet Take 1 tablet (25 mg total) by mouth daily.  . Fluocinonide Emulsified Base 0.05 % CREA APPLY 1 APPLICATION TOPICALLY 2 (TWO) TIMES DAILY.  . hydrochlorothiazide (HYDRODIURIL) 25 MG tablet TAKE 1 TABLET BY MOUTH EVERY DAY  . HYDROcodone-acetaminophen (NORCO) 7.5-325 MG tablet TAKE 1 TABLET BY MOUTH TWICE DAILY AS NEEDED FOR MODERATE PAIN (KNEE PAIN)  . magnesium hydroxide (MILK OF MAGNESIA) 400 MG/5ML suspension Take by mouth daily as needed for mild constipation.  . meclizine (ANTIVERT) 12.5 MG tablet Take 1 tablet (12.5 mg total) by mouth 3 (three) times daily as needed for dizziness.  . Meth-Hyo-M Bl-Na Phos-Ph Sal (URO-MP) 118 MG CAPS Take 1 capsule by mouth 3 (three) times daily as needed.  . traMADol (ULTRAM) 50 MG tablet Take 1 tablet (50 mg total) by mouth every 6 (six) hours as needed. for pain  . albuterol (PROVENTIL HFA;VENTOLIN HFA) 108 (90 Base) MCG/ACT inhaler Inhale 2 puffs into the lungs every 6 (six) hours as needed. (Patient not taking: Reported on 11/16/2017)  . polyethylene glycol powder (GLYCOLAX/MIRALAX) powder Dissolve 17 grams in at least 8 ounces water/juice and drink once daily (Patient not taking: Reported on 11/16/2017)  . [DISCONTINUED] HYDROcodone-homatropine (HYCODAN) 5-1.5 MG/5ML syrup Take 5 mLs by mouth every 8 (eight) hours as needed for cough.   No  facility-administered encounter medications on file as of 11/16/2017.     Activities of Daily Living In your present state of health, do you have any difficulty performing the following activities: 11/16/2017 11/16/2017  Hearing? N N  Vision? N N  Difficulty concentrating or making decisions? N -  Walking or climbing stairs? N -  Dressing or bathing? N -  Doing errands, shopping? N -  Preparing Food and eating ? N -  Using the Toilet? N -  In the past six months, have you accidently leaked urine? Y -  Comment follows with [facility] -  Do you have problems with loss of bowel control? N -  Managing your Medications? N -  Managing your Finances? N -  Housekeeping or managing your Housekeeping? N -  Some recent data might be hidden    Patient Care Team: Lucille Passy, MD as PCP - General Rana Snare, MD as Consulting Physician (Urology) Maisie Fus, MD as Consulting Physician (Obstetrics and Gynecology) Juluis Rainier as Consulting Physician (Optometry)    Assessment:   This is a routine wellness examination for [facility]. Physical assessment deferred to PCP.  Exercise Activities and Dietary recommendations Current Exercise Habits: Home exercise routine, Type of exercise: treadmill, Time (Minutes): 10, Frequency (Times/Week): 3, Weekly Exercise (Minutes/Week): 30, Intensity: Mild Diet (meal preparation, eat out, water intake, caffeinated beverages, dairy products, fruits and vegetables): Pt states she is on a weight watchers diet.    Goals    . Increase physical activity     Starting 11/08/2016, I will continue to do stretching for 30 min 3 days per week.        Fall Risk Fall Risk  11/16/2017 11/08/2016 11/03/2015  Falls in the past year? No No No    Depression Screen PHQ 2/9 Scores 11/16/2017 11/08/2016 11/03/2015 05/29/2012  PHQ - 2 Score 0 0 0 0     Cognitive Function MMSE - Mini Mental State Exam 11/16/2017 11/08/2016 11/03/2015  Orientation to time 5 5 5   Orientation  to Place 5 5 5   Registration 3 3 3   Attention/ Calculation 5 0 0  Recall 3 3 3   Language- name 2 objects 2 0 0  Language- repeat 1 1 1   Language- follow 3 step command 3 3 3   Language- read & follow direction 1 0 0  Write a sentence 1 0 0  Copy design 1 0 0  Total score 30 20 20         Immunization History  Administered Date(s) Administered  . Influenza Split 05/29/2012  . Influenza,inj,Quad PF,6+ Mos 06/23/2016, 05/27/2017  . Pneumococcal Conjugate-13 11/03/2015  . Pneumococcal Polysaccharide-23 07/14/2006, 05/29/2012  . Td 07/11/2008  . Tdap 04/23/2014  . Zoster 10/27/2010   Screening Tests Health Maintenance  Topic Date Due  . MAMMOGRAM  09/22/2012  . INFLUENZA VACCINE  02/23/2018  . COLONOSCOPY  07/29/2021  . TETANUS/TDAP  04/23/2024  . DEXA SCAN  Completed  . PNA vac Low Risk Adult  Completed      Plan:   Follow up with Dr. Deborra Medina as directed  Continue to eat heart healthy diet (full of fruits, vegetables, whole grains, lean protein, water--limit salt, fat, and sugar intake) and increase physical activity as tolerated.  Continue doing brain stimulating activities (puzzles, reading, adult coloring books, staying active) to keep memory sharp.     I have personally reviewed and noted the following in the patient's chart:   . Medical and social history . Use of alcohol, tobacco or illicit drugs  . Current medications and supplements . Functional ability and status . Nutritional status . Physical activity . Advanced directives . List of other physicians . Hospitalizations, surgeries, and ER visits in previous 12 months . Vitals . Screenings to include cognitive, depression, and falls . Referrals and appointments  In addition, I have reviewed and discussed with patient certain preventive protocols, quality metrics, and best practice recommendations. A written personalized care plan for preventive services as well as general preventive health recommendations  were provided to patient.  Naaman Plummer [place], [place]  11/16/2017

## 2017-11-16 ENCOUNTER — Other Ambulatory Visit: Payer: Self-pay

## 2017-11-16 ENCOUNTER — Telehealth: Payer: Self-pay | Admitting: Family Medicine

## 2017-11-16 ENCOUNTER — Encounter: Payer: Self-pay | Admitting: Behavioral Health

## 2017-11-16 ENCOUNTER — Ambulatory Visit (INDEPENDENT_AMBULATORY_CARE_PROVIDER_SITE_OTHER): Payer: Medicare Other | Admitting: Behavioral Health

## 2017-11-16 VITALS — BP 140/86 | HR 77 | Ht 65.0 in | Wt 213.4 lb

## 2017-11-16 DIAGNOSIS — Z0000 Encounter for general adult medical examination without abnormal findings: Secondary | ICD-10-CM

## 2017-11-16 MED ORDER — HYDROCODONE-ACETAMINOPHEN 7.5-325 MG PO TABS: ORAL_TABLET | ORAL | 0 refills | Status: DC

## 2017-11-16 NOTE — Patient Instructions (Signed)
Plan:   Follow up with Dr. Deborra Medina as directed  Continue to eat heart healthy diet (full of fruits, vegetables, whole grains, lean protein, water--limit salt, fat, and sugar intake) and increase physical activity as tolerated.  Continue doing brain stimulating activities (puzzles, reading, adult coloring books, staying active) to keep memory sharp.    Heather Shaw , Thank you for taking time to come for your Medicare Wellness Visit. I appreciate your ongoing commitment to your health goals. Please review the following plan we discussed and let me know if I can assist you in the future.   These are the goals we discussed: Goals    . Increase physical activity     Starting 11/08/2016, I will continue to do stretching for 30 min 3 days per week.     . Weight (lb) < 200 lb (90.7 kg)     By using weight watchers and beginning to exercise.       This is a list of the screening recommended for you and due dates:  Health Maintenance  Topic Date Due  . Mammogram  09/22/2012  . Flu Shot  02/23/2018  . Colon Cancer Screening  07/29/2021  . Tetanus Vaccine  04/23/2024  . DEXA scan (bone density measurement)  Completed  . Pneumonia vaccines  Completed    Health Maintenance for Postmenopausal Women Menopause is a normal process in which your reproductive ability comes to an end. This process happens gradually over a span of months to years, usually between the ages of 49 and 43. Menopause is complete when you have missed 12 consecutive menstrual periods. It is important to talk with your health care provider about some of the most common conditions that affect postmenopausal women, such as heart disease, cancer, and bone loss (osteoporosis). Adopting a healthy lifestyle and getting preventive care can help to promote your health and wellness. Those actions can also lower your chances of developing some of these common conditions. What should I know about menopause? During menopause, you may  experience a number of symptoms, such as:  Moderate-to-severe hot flashes.  Night sweats.  Decrease in sex drive.  Mood swings.  Headaches.  Tiredness.  Irritability.  Memory problems.  Insomnia.  Choosing to treat or not to treat menopausal changes is an individual decision that you make with your health care provider. What should I know about hormone replacement therapy and supplements? Hormone therapy products are effective for treating symptoms that are associated with menopause, such as hot flashes and night sweats. Hormone replacement carries certain risks, especially as you become older. If you are thinking about using estrogen or estrogen with progestin treatments, discuss the benefits and risks with your health care provider. What should I know about heart disease and stroke? Heart disease, heart attack, and stroke become more likely as you age. This may be due, in part, to the hormonal changes that your body experiences during menopause. These can affect how your body processes dietary fats, triglycerides, and cholesterol. Heart attack and stroke are both medical emergencies. There are many things that you can do to help prevent heart disease and stroke:  Have your blood pressure checked at least every 1-2 years. High blood pressure causes heart disease and increases the risk of stroke.  If you are 13-53 years old, ask your health care provider if you should take aspirin to prevent a heart attack or a stroke.  Do not use any tobacco products, including cigarettes, chewing tobacco, or electronic cigarettes. If you  need help quitting, ask your health care provider.  It is important to eat a healthy diet and maintain a healthy weight. ? Be sure to include plenty of vegetables, fruits, low-fat dairy products, and lean protein. ? Avoid eating foods that are high in solid fats, added sugars, or salt (sodium).  Get regular exercise. This is one of the most important things  that you can do for your health. ? Try to exercise for at least 150 minutes each week. The type of exercise that you do should increase your heart rate and make you sweat. This is known as moderate-intensity exercise. ? Try to do strengthening exercises at least twice each week. Do these in addition to the moderate-intensity exercise.  Know your numbers.Ask your health care provider to check your cholesterol and your blood glucose. Continue to have your blood tested as directed by your health care provider.  What should I know about cancer screening? There are several types of cancer. Take the following steps to reduce your risk and to catch any cancer development as early as possible. Breast Cancer  Practice breast self-awareness. ? This means understanding how your breasts normally appear and feel. ? It also means doing regular breast self-exams. Let your health care provider know about any changes, no matter how small.  If you are 49 or older, have a clinician do a breast exam (clinical breast exam or CBE) every year. Depending on your age, family history, and medical history, it may be recommended that you also have a yearly breast X-ray (mammogram).  If you have a family history of breast cancer, talk with your health care provider about genetic screening.  If you are at high risk for breast cancer, talk with your health care provider about having an MRI and a mammogram every year.  Breast cancer (BRCA) gene test is recommended for women who have family members with BRCA-related cancers. Results of the assessment will determine the need for genetic counseling and BRCA1 and for BRCA2 testing. BRCA-related cancers include these types: ? Breast. This occurs in males or females. ? Ovarian. ? Tubal. This may also be called fallopian tube cancer. ? Cancer of the abdominal or pelvic lining (peritoneal cancer). ? Prostate. ? Pancreatic.  Cervical, Uterine, and Ovarian Cancer Your health  care provider may recommend that you be screened regularly for cancer of the pelvic organs. These include your ovaries, uterus, and vagina. This screening involves a pelvic exam, which includes checking for microscopic changes to the surface of your cervix (Pap test).  For women ages 21-65, health care providers may recommend a pelvic exam and a Pap test every three years. For women ages 48-65, they may recommend the Pap test and pelvic exam, combined with testing for human papilloma virus (HPV), every five years. Some types of HPV increase your risk of cervical cancer. Testing for HPV may also be done on women of any age who have unclear Pap test results.  Other health care providers may not recommend any screening for nonpregnant women who are considered low risk for pelvic cancer and have no symptoms. Ask your health care provider if a screening pelvic exam is right for you.  If you have had past treatment for cervical cancer or a condition that could lead to cancer, you need Pap tests and screening for cancer for at least 20 years after your treatment. If Pap tests have been discontinued for you, your risk factors (such as having a new sexual partner) need to be  reassessed to determine if you should start having screenings again. Some women have medical problems that increase the chance of getting cervical cancer. In these cases, your health care provider may recommend that you have screening and Pap tests more often.  If you have a family history of uterine cancer or ovarian cancer, talk with your health care provider about genetic screening.  If you have vaginal bleeding after reaching menopause, tell your health care provider.  There are currently no reliable tests available to screen for ovarian cancer.  Lung Cancer Lung cancer screening is recommended for adults 49-85 years old who are at high risk for lung cancer because of a history of smoking. A yearly low-dose CT scan of the lungs is  recommended if you:  Currently smoke.  Have a history of at least 30 pack-years of smoking and you currently smoke or have quit within the past 15 years. A pack-year is smoking an average of one pack of cigarettes per day for one year.  Yearly screening should:  Continue until it has been 15 years since you quit.  Stop if you develop a health problem that would prevent you from having lung cancer treatment.  Colorectal Cancer  This type of cancer can be detected and can often be prevented.  Routine colorectal cancer screening usually begins at age 52 and continues through age 43.  If you have risk factors for colon cancer, your health care provider may recommend that you be screened at an earlier age.  If you have a family history of colorectal cancer, talk with your health care provider about genetic screening.  Your health care provider may also recommend using home test kits to check for hidden blood in your stool.  A small camera at the end of a tube can be used to examine your colon directly (sigmoidoscopy or colonoscopy). This is done to check for the earliest forms of colorectal cancer.  Direct examination of the colon should be repeated every 5-10 years until age 61. However, if early forms of precancerous polyps or small growths are found or if you have a family history or genetic risk for colorectal cancer, you may need to be screened more often.  Skin Cancer  Check your skin from head to toe regularly.  Monitor any moles. Be sure to tell your health care provider: ? About any new moles or changes in moles, especially if there is a change in a mole's shape or color. ? If you have a mole that is larger than the size of a pencil eraser.  If any of your family members has a history of skin cancer, especially at a young age, talk with your health care provider about genetic screening.  Always use sunscreen. Apply sunscreen liberally and repeatedly throughout the  day.  Whenever you are outside, protect yourself by wearing long sleeves, pants, a wide-brimmed hat, and sunglasses.  What should I know about osteoporosis? Osteoporosis is a condition in which bone destruction happens more quickly than new bone creation. After menopause, you may be at an increased risk for osteoporosis. To help prevent osteoporosis or the bone fractures that can happen because of osteoporosis, the following is recommended:  If you are 54-75 years old, get at least 1,000 mg of calcium and at least 600 mg of vitamin D per day.  If you are older than age 60 but younger than age 84, get at least 1,200 mg of calcium and at least 600 mg of vitamin D per  day.  If you are older than age 30, get at least 1,200 mg of calcium and at least 800 mg of vitamin D per day.  Smoking and excessive alcohol intake increase the risk of osteoporosis. Eat foods that are rich in calcium and vitamin D, and do weight-bearing exercises several times each week as directed by your health care provider. What should I know about how menopause affects my mental health? Depression may occur at any age, but it is more common as you become older. Common symptoms of depression include:  Low or sad mood.  Changes in sleep patterns.  Changes in appetite or eating patterns.  Feeling an overall lack of motivation or enjoyment of activities that you previously enjoyed.  Frequent crying spells.  Talk with your health care provider if you think that you are experiencing depression. What should I know about immunizations? It is important that you get and maintain your immunizations. These include:  Tetanus, diphtheria, and pertussis (Tdap) booster vaccine.  Influenza every year before the flu season begins.  Pneumonia vaccine.  Shingles vaccine.  Your health care provider may also recommend other immunizations. This information is not intended to replace advice given to you by your health care provider.  Make sure you discuss any questions you have with your health care provider. Document Released: 09/03/2005 Document Revised: 01/30/2016 Document Reviewed: 04/15/2015 Elsevier Interactive Patient Education  2018 [employer].

## 2017-11-16 NOTE — Progress Notes (Signed)
Printed Hydrocodone Rx for May/will call pt when TA signs/thx dmf

## 2017-11-22 NOTE — Progress Notes (Signed)
Medical screening examination/treatment/procedure(s) were performed by the Wellness Coach, RN. As primary care provider I was immediately available for consulation/collaboration. I agree with above documentation. Milton Streicher, AGNP-C 

## 2017-11-23 MED FILL — HYDROCODON-APAP 7.5-325: 7.5-325 | 30 days supply | Qty: 60 | Fill #0

## 2017-12-20 ENCOUNTER — Other Ambulatory Visit: Payer: Self-pay

## 2017-12-20 MED ORDER — HYDROCODONE-ACETAMINOPHEN 7.5-325 MG PO TABS: ORAL_TABLET | ORAL | 0 refills | Status: DC

## 2017-12-20 NOTE — Telephone Encounter (Signed)
Sent Rx refills for June and July and informed pt she needs to schedule OV for late July for more refills. TLG

## 2017-12-24 MED FILL — HYDROCODON-APAP 7.5-325: 7.5-325 | 30 days supply | Qty: 60 | Fill #0

## 2017-12-31 ENCOUNTER — Other Ambulatory Visit: Payer: Self-pay | Admitting: Family Medicine

## 2018-01-09 ENCOUNTER — Other Ambulatory Visit: Payer: Self-pay | Admitting: Family Medicine

## 2018-01-11 ENCOUNTER — Other Ambulatory Visit: Payer: Self-pay | Admitting: Family Medicine

## 2018-01-14 ENCOUNTER — Telehealth: Payer: Self-pay | Admitting: Family Medicine

## 2018-01-16 NOTE — Telephone Encounter (Signed)
Rx was faxed for #60 with 2 refills on 6.17.19/thx dmf

## 2018-01-23 MED FILL — HYDROCODON-APAP 7.5-325: 7.5-325 | 30 days supply | Qty: 60 | Fill #0

## 2018-01-29 ENCOUNTER — Other Ambulatory Visit: Payer: Self-pay | Admitting: Family Medicine

## 2018-01-30 NOTE — Telephone Encounter (Signed)
Copied from [place] [id number]. Topic: Quick Communication - Rx Refill/Question >> Jan 30, 2018  4:34 PM Bea Graff, NT wrote: Medication: traMADol (ULTRAM) 50 MG tablet   Has the patient contacted their pharmacy? Yes.   (Agent: If no, request that the patient contact the pharmacy for the refill.) (Agent: If yes, when and what did the pharmacy advise?)  Preferred Pharmacy (with phone number or street name): [id number] - [place], [place] [phone] (Phone) [phone] (Fax)      Agent: Please be advised that RX refills may take up to 3 business days. We ask that you follow-up with your pharmacy.

## 2018-01-31 NOTE — Telephone Encounter (Signed)
Refaxed again to the pharmacy/thx dmf

## 2018-02-09 DIAGNOSIS — M654 Radial styloid tenosynovitis [de Quervain]: Secondary | ICD-10-CM | POA: Diagnosis not present

## 2018-02-15 ENCOUNTER — Encounter: Payer: Self-pay | Admitting: Family Medicine

## 2018-02-15 ENCOUNTER — Ambulatory Visit (INDEPENDENT_AMBULATORY_CARE_PROVIDER_SITE_OTHER): Payer: Medicare Other | Admitting: Family Medicine

## 2018-02-15 VITALS — BP 136/84 | HR 53 | Temp 98.3°F | Ht 65.0 in | Wt 210.4 lb

## 2018-02-15 DIAGNOSIS — F1120 Opioid dependence, uncomplicated: Secondary | ICD-10-CM

## 2018-02-15 DIAGNOSIS — G8929 Other chronic pain: Secondary | ICD-10-CM | POA: Diagnosis not present

## 2018-02-15 MED ORDER — TRAMADOL HCL 50 MG PO TABS: 50.0000 mg | ORAL_TABLET | Freq: Four times a day (QID) | ORAL | 2 refills | Status: DC | PRN

## 2018-02-15 MED ORDER — HYDROCODONE-ACETAMINOPHEN 7.5-325 MG PO TABS: ORAL_TABLET | ORAL | 0 refills | Status: DC

## 2018-02-15 NOTE — Assessment & Plan Note (Signed)
Indication for chronic opioid: hip pain, back pain (DDD, stenosis), right knee (s/p right knee arthroscopy and multiple sessions of PT following surgery in 03/2017). Medication and dose: Tramadol - 1 tablet every 6 hours as needed for pain # pills per month: 60 Medication and dose: Norco 7.5- 325- 1 tab by mouth twice daily as needed for intractable pain # pills per month:60 Last UDS date: 02/15/18 Opioid Treatment Agreement signed (Y/N): Y Opioid Treatment Agreement last reviewed with patient:  Yes  [place] reviewed this encounter (include red flags):  Yes, no red flags

## 2018-02-15 NOTE — Progress Notes (Signed)
Subjective:   Patient ID: Heather Shaw, female    DOB: 1940-09-14, 77 y.o.   MRN: [id number]  Heather Shaw is a pleasant 77 y.o. year old female who presents to clinic today with Pain (Patient is here today to F/U with chronic pain for refills.  She currently takes Hydro-APAP 7.5mg  1bid prn and Tramadol 50mg  1q6h prn and is compliant per [place] PMP.  She states that they are still helpful and wishes to continue so her pain can stay managable.)  on 02/15/2018  HPI:   Indication for chronic opioid: hip pain, back pain (DDD, stenosis), right knee (s/p right knee arthroscopy and multiple sessions of PT following surgery in 03/2017). Medication and dose: Tramadol - 1 tablet every 6 hours as needed for pain # pills per month: 60 Medication and dose: Norco 7.5- 325- 1 tab by mouth twice daily as needed for intractable pain # pills per month:60 Last UDS date: 02/15/18 Opioid Treatment Agreement signed (Y/N): Y Opioid Treatment Agreement last reviewed with patient:  Yes  [place] reviewed this encounter (include red flags):  Yes, no red flags  She feels this pain regimen is working okay for her and would like to continue it.  Current Outpatient Medications on File Prior to Visit  Medication Sig Dispense Refill  . amLODipine (NORVASC) 10 MG tablet TAKE 1 TABLET BY MOUTH EVERY DAY 90 tablet 1  . atenolol (TENORMIN) 25 MG tablet TAKE 1 TABLET BY MOUTH EVERY DAY 90 tablet 0  . Fluocinonide Emulsified Base 0.05 % CREA APPLY 1 APPLICATION TOPICALLY 2 (TWO) TIMES DAILY. 30 g 0  . hydrochlorothiazide (HYDRODIURIL) 25 MG tablet TAKE 1 TABLET BY MOUTH EVERY DAY 90 tablet 0  . magnesium hydroxide (MILK OF MAGNESIA) 400 MG/5ML suspension Take by mouth daily as needed for mild constipation.    . meclizine (ANTIVERT) 12.5 MG tablet Take 1 tablet (12.5 mg total) by mouth 3 (three) times daily as needed for dizziness. 30 tablet 0  . Meth-Hyo-M Bl-Na Phos-Ph Sal (URO-MP) 118 MG CAPS Take 1 capsule by mouth 3  (three) times daily as needed.  3  . polyethylene glycol powder (GLYCOLAX/MIRALAX) powder Dissolve 17 grams in at least 8 ounces water/juice and drink once daily 527 g 3   No current facility-administered medications on file prior to visit.     Allergies  Allergen Reactions  . Contrast Media [Iodinated Diagnostic Agents]     ? angioedema  . Lisinopril     ? angioedema  . Allegra [Fexofenadine] Other (See Comments)    Makes pt nervous  . Codeine Nausea And Vomiting  . Nsaids     "stomach on fire feeling"   . Prednisone     Causes elevation in blood pressure- ? All steroids cause same reaction  . Zithromax [Azithromycin] Other (See Comments)    Burns stomach  . Sulfa Antibiotics Rash    Past Medical History:  Diagnosis Date  . Arthritis   . Bladder pain   . Bradycardia   . DDD (degenerative disc disease), lumbosacral   . Hyperlipidemia   . Hyperplastic colon polyp   . Hypertension   . IC (interstitial cystitis)   . Internal hemorrhoids   . Polyp of rectum   . PONV (postoperative nausea and vomiting)   . Squamous papilloma    in esophageal polyp  . Uterine fibroid   . Vertigo   . Wears glasses   . Wears partial dentures     Past Surgical History:  Procedure Laterality Date  . COLONOSCOPY W/ POLYPECTOMY  2009  . CYSTO WITH HYDRODISTENSION N/A 05/20/2014   Procedure: CYSTOSCOPY/HYDRODISTENSION, INSTILLATION OF CHLORPACTIN;  Surgeon: Bernestine Amass, MD;  Location: [facility];  Service: Urology;  Laterality: N/A;  . CYSTO/  HYDRODISTENTION/  INSTILLATION CLORPACTIC  09-21-2010  . DILATION AND CURETTAGE OF UTERUS  1988  . KNEE ARTHROSCOPY Right 11-18-2009  . TRIGGER FINGER RELEASE Right 08/23/2016   Procedure: RIGHT RING FINGER TRIGGER RELEASE;  Surgeon: Milly Jakob, MD;  Location: [facility];  Service: Orthopedics;  Laterality: Right;    Family History  Problem Relation Age of Onset  . Clotting disorder Mother 42  . COPD Father     . Heart attack Father 74  . Emphysema Father   . Stroke Paternal Aunt   . Hypertension Sister   . Hypertension Brother     Social History   Socioeconomic History  . Marital status: Married    Spouse name: Not on file  . Number of children: 6  . Years of education: Not on file  . Highest education level: Not on file  Occupational History  . Occupation: housewife  Social Needs  . Financial resource strain: Not on file  . Food insecurity:    Worry: Not on file    Inability: Not on file  . Transportation needs:    Medical: Not on file    Non-medical: Not on file  Tobacco Use  . Smoking status: Former Smoker    Types: Cigarettes    Last attempt to quit: 05/17/1971    Years since quitting: 46.7  . Smokeless tobacco: Never Used  Substance and Sexual Activity  . Alcohol use: No    Alcohol/week: 0.0 oz  . Drug use: No  . Sexual activity: Never  Lifestyle  . Physical activity:    Days per week: Not on file    Minutes per session: Not on file  . Stress: Not on file  Relationships  . Social connections:    Talks on phone: Not on file    Gets together: Not on file    Attends religious service: Not on file    Active member of club or organization: Not on file    Attends meetings of clubs or organizations: Not on file    Relationship status: Not on file  . Intimate partner violence:    Fear of current or ex partner: Not on file    Emotionally abused: Not on file    Physically abused: Not on file    Forced sexual activity: Not on file  Other Topics Concern  . Not on file  Social History Narrative  . Not on file   The PMH, PSH, Social History, Family History, Medications, and allergies have been reviewed in [facility], and have been updated if relevant.  Review of Systems  Musculoskeletal: Positive for arthralgias, back pain, joint swelling, myalgias, neck pain and neck stiffness. Negative for gait problem.  All other systems reviewed and are negative.      Objective:     BP 136/84 (BP Location: Left Arm, Patient Position: Sitting, Cuff Size: Normal)   Pulse (!) 53   Temp 98.3 F (36.8 C) (Oral)   Ht 5\' 5"  (1.651 m)   Wt 210 lb 6.4 oz (95.4 kg)   SpO2 96%   BMI 35.01 kg/m    Physical Exam  Constitutional: She is oriented to person, place, and time. She appears well-developed and well-nourished. No distress.  HENT:  Head: Normocephalic and atraumatic.  Eyes: EOM are normal.  Cardiovascular: Normal rate.  Pulmonary/Chest: Effort normal.  Neurological: She is alert and oriented to person, place, and time. No cranial nerve deficit.  Skin: Skin is warm and dry. She is not diaphoretic.  Psychiatric: She has a normal mood and affect. Her behavior is normal. Judgment and thought content normal.  Nursing note and vitals reviewed.         Assessment & Plan:   Other chronic pain No follow-ups on file. l

## 2018-02-19 LAB — PAIN MGMT, PROFILE 8 W/CONF, U
6 Acetylmorphine: NEGATIVE ng/mL (ref <10)
Alcohol Metabolites: NEGATIVE ng/mL (ref <500)
Amphetamines: NEGATIVE ng/mL (ref <500)
Benzodiazepines: NEGATIVE ng/mL (ref <100)
Buprenorphine, Urine: NEGATIVE ng/mL (ref <5)
COCAINE METABOLITE: NEGATIVE ng/mL (ref <150)
Codeine: NEGATIVE ng/mL (ref <50)
Creatinine: 94.3 mg/dL
HYDROCODONE: 1060 ng/mL — AB (ref <50)
Hydromorphone: 497 ng/mL — ABNORMAL HIGH (ref <50)
MARIJUANA METABOLITE: NEGATIVE ng/mL (ref <20)
MDMA: NEGATIVE ng/mL (ref <500)
MORPHINE: NEGATIVE ng/mL (ref <50)
Norhydrocodone: 1374 ng/mL — ABNORMAL HIGH (ref <50)
OPIATES: POSITIVE ng/mL — AB (ref <100)
Oxidant: NEGATIVE ug/mL (ref <200)
Oxycodone: NEGATIVE ng/mL (ref <100)
PH: 6.42 (ref 4.5–9.0)

## 2018-02-23 MED FILL — HYDROCODON-APAP 7.5-325: 7.5-325 | 30 days supply | Qty: 60 | Fill #0

## 2018-03-13 ENCOUNTER — Other Ambulatory Visit: Payer: Self-pay | Admitting: Family Medicine

## 2018-03-22 ENCOUNTER — Telehealth: Payer: Self-pay

## 2018-03-22 NOTE — Telephone Encounter (Signed)
Copied from [place] [id number]. Topic: General - Other >> Mar 22, 2018  9:47 AM Rutherford Nail, NT wrote: Reason for CRM: Patient calling and states that her insurance is needing to know why she is taking tramadol and hydrocodone. States that they are needing this information because she is trying to get her medicare supplement payment to be lowered. Would like this information faxed to [phone] Attn: Individual Medicare Supplement Underwriter. CB#: [phone]

## 2018-03-28 MED FILL — HYDROCODON-APAP 7.5-325: 7.5-325 | 30 days supply | Qty: 60 | Fill #0

## 2018-04-01 ENCOUNTER — Other Ambulatory Visit: Payer: Self-pay | Admitting: Family Medicine

## 2018-04-05 NOTE — Telephone Encounter (Signed)
Letter created/fax coversheet created/it is on TA's desk for review and will be faxed if it is approved/thx dmf

## 2018-04-13 ENCOUNTER — Telehealth: Payer: Self-pay | Admitting: Family Medicine

## 2018-04-13 NOTE — Telephone Encounter (Signed)
Copied from [place] [id number]. Topic: Quick Communication - See Telephone Encounter >> Apr 13, 2018  4:22 PM Blase Mess A wrote: CRM for notification. See Telephone encounter for: 04/13/18. Patient is requesting a copy of the letter that Dr. Deborra Medina that was sent to her [place]. She was declined coverage.  And wanted to know what was Deborra Medina stated to them.  Please advise [phone]

## 2018-04-14 NOTE — Telephone Encounter (Signed)
Printed the letter that was faxed/also created a letter to the patient explaining the letter in detail/mailed both/thx dmf

## 2018-04-24 ENCOUNTER — Other Ambulatory Visit: Payer: Self-pay | Admitting: Family Medicine

## 2018-04-25 MED FILL — HYDROCODON-APAP 7.5-325: 7.5-325 | 30 days supply | Qty: 60 | Fill #0

## 2018-04-27 DIAGNOSIS — N3010 Interstitial cystitis (chronic) without hematuria: Secondary | ICD-10-CM | POA: Diagnosis not present

## 2018-05-11 DIAGNOSIS — M654 Radial styloid tenosynovitis [de Quervain]: Secondary | ICD-10-CM | POA: Diagnosis not present

## 2018-05-12 ENCOUNTER — Telehealth: Payer: Self-pay | Admitting: Family Medicine

## 2018-05-12 DIAGNOSIS — M654 Radial styloid tenosynovitis [de Quervain]: Secondary | ICD-10-CM | POA: Diagnosis not present

## 2018-05-12 NOTE — Telephone Encounter (Signed)
Copied from [place] [id number]. Topic: Quick Communication - See Telephone Encounter >> May 12, 2018 12:08 PM Bea Graff, NT wrote: CRM for notification. See Telephone encounter for: 05/12/18. Pts daughter calling and states her mom had hand surgery today and was prescribed oxycodone for her pain. They want to verify this is ok to fill due to the pain contact she has with Dr. Deborra Medina. Please advise. CB#: [phone]

## 2018-05-15 NOTE — Telephone Encounter (Signed)
Yes that is okay.  She did the right thing by letting us know!  I hope she is recovering well.

## 2018-05-15 NOTE — Telephone Encounter (Signed)
Dr. Aron, please advise. 

## 2018-05-16 ENCOUNTER — Ambulatory Visit: Payer: Medicare Other

## 2018-05-17 ENCOUNTER — Ambulatory Visit: Payer: Medicare Other | Admitting: Family Medicine

## 2018-05-24 ENCOUNTER — Ambulatory Visit (INDEPENDENT_AMBULATORY_CARE_PROVIDER_SITE_OTHER): Payer: Medicare Other | Admitting: Family Medicine

## 2018-05-24 ENCOUNTER — Encounter: Payer: Self-pay | Admitting: Family Medicine

## 2018-05-24 VITALS — BP 134/76 | HR 57 | Temp 99.3°F | Ht 65.0 in | Wt 208.0 lb

## 2018-05-24 DIAGNOSIS — Z23 Encounter for immunization: Secondary | ICD-10-CM | POA: Diagnosis not present

## 2018-05-24 DIAGNOSIS — F4321 Adjustment disorder with depressed mood: Secondary | ICD-10-CM

## 2018-05-24 DIAGNOSIS — G8929 Other chronic pain: Secondary | ICD-10-CM | POA: Diagnosis not present

## 2018-05-24 DIAGNOSIS — I10 Essential (primary) hypertension: Secondary | ICD-10-CM | POA: Diagnosis not present

## 2018-05-24 HISTORY — DX: Adjustment disorder with depressed mood: F43.21

## 2018-05-24 LAB — COMPREHENSIVE METABOLIC PANEL
ALBUMIN: 4.2 g/dL (ref 3.5–5.2)
ALK PHOS: 48 U/L (ref 39–117)
ALT: 11 U/L (ref 0–35)
AST: 14 U/L (ref 0–37)
BUN: 12 mg/dL (ref 6–23)
CHLORIDE: 100 meq/L (ref 96–112)
CO2: 30 meq/L (ref 19–32)
CREATININE: 0.77 mg/dL (ref 0.40–1.20)
Calcium: 9.6 mg/dL (ref 8.4–10.5)
GFR: 77.6 mL/min (ref >60.00)
GLUCOSE: 112 mg/dL — AB (ref 70–99)
POTASSIUM: 3.7 meq/L (ref 3.5–5.1)
SODIUM: 139 meq/L (ref 135–145)
Total Bilirubin: 0.4 mg/dL (ref 0.2–1.2)
Total Protein: 6.9 g/dL (ref 6.0–8.3)

## 2018-05-24 LAB — LIPID PANEL
Cholesterol: 218 mg/dL — ABNORMAL HIGH (ref 0–200)
HDL: 39.6 mg/dL (ref >39.00)
NONHDL: 178.77
TRIGLYCERIDES: 231 mg/dL — AB (ref 0.0–149.0)
Total CHOL/HDL Ratio: 6
VLDL: 46.2 mg/dL — ABNORMAL HIGH (ref 0.0–40.0)

## 2018-05-24 LAB — CBC WITH DIFFERENTIAL/PLATELET
BASOS PCT: 0.8 % (ref 0.0–3.0)
Basophils Absolute: 0.1 10*3/uL (ref 0.0–0.1)
EOS ABS: 0.3 10*3/uL (ref 0.0–0.7)
Eosinophils Relative: 2.9 % (ref 0.0–5.0)
HCT: 42.1 % (ref 36.0–46.0)
HEMOGLOBIN: 14.6 g/dL (ref 12.0–15.0)
LYMPHS ABS: 3.7 10*3/uL (ref 0.7–4.0)
Lymphocytes Relative: 39.6 % (ref 12.0–46.0)
MCHC: 34.7 g/dL (ref 30.0–36.0)
MCV: 85.1 fl (ref 78.0–100.0)
Monocytes Absolute: 0.8 10*3/uL (ref 0.1–1.0)
Monocytes Relative: 8.5 % (ref 3.0–12.0)
NEUTROS PCT: 48.2 % (ref 43.0–77.0)
Neutro Abs: 4.5 10*3/uL (ref 1.4–7.7)
PLATELETS: 244 10*3/uL (ref 150.0–400.0)
RBC: 4.95 Mil/uL (ref 3.87–5.11)
RDW: 13 % (ref 11.5–15.5)
WBC: 9.4 10*3/uL (ref 4.0–10.5)

## 2018-05-24 LAB — LDL CHOLESTEROL, DIRECT: LDL DIRECT: 158 mg/dL

## 2018-05-24 LAB — TSH: TSH: 1.41 u[IU]/mL (ref 0.35–4.50)

## 2018-05-24 MED ORDER — HYDROCODONE-ACETAMINOPHEN 7.5-325 MG PO TABS: ORAL_TABLET | ORAL | 0 refills | Status: DC

## 2018-05-24 MED ORDER — TRAMADOL HCL 50 MG PO TABS: 50.0000 mg | ORAL_TABLET | Freq: Four times a day (QID) | ORAL | 2 refills | Status: DC | PRN

## 2018-05-24 MED FILL — HYDROCODON-APAP 7.5-325: 7.5-325 | 30 days supply | Qty: 60 | Fill #0

## 2018-05-24 NOTE — Assessment & Plan Note (Signed)
>  25 minutes spent in face to face time with patient, >50% spent in counselling or coordination of care She feels she is coping but there are days that she misses him so much that she is "hanging on by a thread."  She doesn't want to take antidepressants or sleep aids and and she said she would never commit suicide.  We discussed grief counseling through hospice.  She is interested in pursuing this.  I have her their contact information- see AVS.

## 2018-05-24 NOTE — Assessment & Plan Note (Signed)
Well controlled. Check labs today.

## 2018-05-24 NOTE — Addendum Note (Signed)
Addended by: Lucille Passy on: 05/24/2018 10:22 AM   Modules accepted: Orders

## 2018-05-24 NOTE — Progress Notes (Signed)
Subjective:   Patient ID: Heather Shaw, female    DOB: Jul 07, 1941, 77 y.o.   MRN: [id number] .   Heather Shaw is a pleasant 77 y.o. year old female who presents to clinic today with Follow-up (Doing well-had right wrist surgery done 05/13/18-was prescirbed Oxycodone has not taken it-wants to discuss proper protocol with her current medication. )  on 05/24/2018  HPI:  Here for follow up.  Doing well after her wrist surgery (10/18).  She was prescribed oxycodone but she was afraid to take it since I manage her pain medications.  She brings in the full bottle today (only 10 tablets) which shows she has not taken any.  She prefers to take what we prescribe for her instead.   She has only taken hydrocodone at night.  Plans to discard the oxycodone.  She was last seen for chronic pain management by me on 02/15/18.  Note reviewed.  Indication for chronic opioid: hip pain, back pain (DDD, stenosis), right knee (s/p right knee arthroscopy and multiple sessions of PT following surgery in 03/2017), write pain. Medication and dose: Tramadol - 1 tablet every 6 hours as needed for pain # pills per month: 60 Medication and dose: Norco 7.5- 325- 1 tab by mouth twice daily as needed for intractable pain # pills per month:60 Last UDS date: 02/15/18 Opioid Treatment Agreement signed (Y/N): Y Opioid Treatment Agreement last reviewed with patient:  Yes           [facility] reviewed this encounter (include red flags):  Yes, no red flags   HTN- has been well controlled with Norvasc 10 mg daily, HCTZ 25 mg daily and atenolol 25 mg daily.  She is due for labs. Lab Results  Component Value Date   CREATININE 0.69 11/08/2016   Lab Results  Component Value Date   CHOL 229 (H) 11/08/2016   HDL 39.10 11/08/2016   LDLCALC 134 (H) 12/29/2015   LDLDIRECT 145.0 11/08/2016   TRIG 247.0 (H) 11/08/2016   CHOLHDL 6 11/08/2016   Grief- lost her husband in May.  She feels she is coping okay.  There are days that  are "really tough."  No SI or HI.  Not sleeping well. Has not been to grief counseling.   Current Outpatient Medications on File Prior to Visit  Medication Sig Dispense Refill  . amLODipine (NORVASC) 10 MG tablet TAKE 1 TABLET BY MOUTH EVERY DAY 90 tablet 1  . atenolol (TENORMIN) 25 MG tablet TAKE 1 TABLET BY MOUTH EVERY DAY 90 tablet 0  . Fluocinonide Emulsified Base 0.05 % CREA APPLY 1 APPLICATION TOPICALLY 2 (TWO) TIMES DAILY. 30 g 0  . hydrochlorothiazide (HYDRODIURIL) 25 MG tablet TAKE 1 TABLET BY MOUTH EVERY DAY 90 tablet 1  . HYDROcodone-acetaminophen (NORCO) 7.5-325 MG tablet TAKE 1 TABLET BY MOUTH TWICE DAILY AS NEEDED FOR MODERATE PAIN (KNEE PAIN) 60 tablet 0  . magnesium hydroxide (MILK OF MAGNESIA) 400 MG/5ML suspension Take by mouth daily as needed for mild constipation.    . meclizine (ANTIVERT) 12.5 MG tablet Take 1 tablet (12.5 mg total) by mouth 3 (three) times daily as needed for dizziness. 30 tablet 0  . Meth-Hyo-M Bl-Na Phos-Ph Sal (URO-MP) 118 MG CAPS Take 1 capsule by mouth 3 (three) times daily as needed.  3  . polyethylene glycol powder (GLYCOLAX/MIRALAX) powder Dissolve 17 grams in at least 8 ounces water/juice and drink once daily 527 g 3  . traMADol (ULTRAM) 50 MG tablet Take 1 tablet (50  mg total) by mouth every 6 (six) hours as needed. for pain 60 tablet 2  . oxyCODONE (OXY IR/ROXICODONE) 5 MG immediate release tablet PLEASE SEE ATTACHED FOR DETAILED DIRECTIONS  0   No current facility-administered medications on file prior to visit.     Allergies  Allergen Reactions  . Contrast Media [Iodinated Diagnostic Agents]     ? angioedema  . Lisinopril     ? angioedema  . Allegra [Fexofenadine] Other (See Comments)    Makes pt nervous  . Codeine Nausea And Vomiting  . Nsaids     "stomach on fire feeling"   . Prednisone     Causes elevation in blood pressure- ? All steroids cause same reaction  . Zithromax [Azithromycin] Other (See Comments)    Burns stomach    . Sulfa Antibiotics Rash    Past Medical History:  Diagnosis Date  . Arthritis   . Bladder pain   . Bradycardia   . DDD (degenerative disc disease), lumbosacral   . Hyperlipidemia   . Hyperplastic colon polyp   . Hypertension   . IC (interstitial cystitis)   . Internal hemorrhoids   . Polyp of rectum   . PONV (postoperative nausea and vomiting)   . Squamous papilloma    in esophageal polyp  . Uterine fibroid   . Vertigo   . Wears glasses   . Wears partial dentures     Past Surgical History:  Procedure Laterality Date  . COLONOSCOPY W/ POLYPECTOMY  2009  . CYSTO WITH HYDRODISTENSION N/A 05/20/2014   Procedure: CYSTOSCOPY/HYDRODISTENSION, INSTILLATION OF CHLORPACTIN;  Surgeon: Bernestine Amass, MD;  Location: [facility];  Service: Urology;  Laterality: N/A;  . CYSTO/  HYDRODISTENTION/  INSTILLATION CLORPACTIC  09-21-2010  . DILATION AND CURETTAGE OF UTERUS  1988  . KNEE ARTHROSCOPY Right 11-18-2009  . TRIGGER FINGER RELEASE Right 08/23/2016   Procedure: RIGHT RING FINGER TRIGGER RELEASE;  Surgeon: Milly Jakob, MD;  Location: [place];  Service: Orthopedics;  Laterality: Right;    Family History  Problem Relation Age of Onset  . Clotting disorder Mother 15  . COPD Father   . Heart attack Father 93  . Emphysema Father   . Stroke Paternal Aunt   . Hypertension Sister   . Hypertension Brother     Social History   Socioeconomic History  . Marital status: Married    Spouse name: Not on file  . Number of children: 6  . Years of education: Not on file  . Highest education level: Not on file  Occupational History  . Occupation: housewife  Social Needs  . Financial resource strain: Not on file  . Food insecurity:    Worry: Not on file    Inability: Not on file  . Transportation needs:    Medical: Not on file    Non-medical: Not on file  Tobacco Use  . Smoking status: Former Smoker    Types: Cigarettes    Last attempt to quit:  05/17/1971    Years since quitting: 47.0  . Smokeless tobacco: Never Used  Substance and Sexual Activity  . Alcohol use: No    Alcohol/week: 0.0 standard drinks  . Drug use: No  . Sexual activity: Never  Lifestyle  . Physical activity:    Days per week: Not on file    Minutes per session: Not on file  . Stress: Not on file  Relationships  . Social connections:    Talks on phone: Not  on file    Gets together: Not on file    Attends religious service: Not on file    Active member of club or organization: Not on file    Attends meetings of clubs or organizations: Not on file    Relationship status: Not on file  . Intimate partner violence:    Fear of current or ex partner: Not on file    Emotionally abused: Not on file    Physically abused: Not on file    Forced sexual activity: Not on file  Other Topics Concern  . Not on file  Social History Narrative  . Not on file   The PMH, PSH, Social History, Family History, Medications, and allergies have been reviewed in [facility], and have been updated if relevant.   Review of Systems  Constitutional: Negative.   HENT: Negative.   Respiratory: Negative.   Cardiovascular: Negative.   Gastrointestinal: Negative.   Endocrine: Negative.   Genitourinary: Negative.   Musculoskeletal: Positive for arthralgias.  Allergic/Immunologic: Negative.   Neurological: Negative.   Hematological: Negative.   Psychiatric/Behavioral: Positive for dysphoric mood and suicidal ideas. Negative for agitation, behavioral problems, confusion, decreased concentration, hallucinations, self-injury and sleep disturbance. The patient is not nervous/anxious and is not hyperactive.   All other systems reviewed and are negative.      Objective:    BP 134/76   Pulse (!) 47   Temp 99.3 F (37.4 C)   Ht 5\' 5"  (1.651 m)   Wt 208 lb (94.3 kg)   SpO2 96%   BMI 34.61 kg/m    Physical Exam  Constitutional: She is oriented to person, place, and time. She appears  well-developed and well-nourished. No distress.  HENT:  Head: Normocephalic and atraumatic.  Eyes: EOM are normal.  Cardiovascular: Normal rate and regular rhythm.  Pulmonary/Chest: Effort normal.  Musculoskeletal: Normal range of motion. She exhibits no edema.  Neurological: She is alert and oriented to person, place, and time. No cranial nerve deficit.  Skin: Skin is warm and dry. She is not diaphoretic.  Psychiatric: She has a normal mood and affect. Her behavior is normal. Judgment and thought content normal.  Nursing note and vitals reviewed.         Assessment & Plan:   Need for influenza vaccination No follow-ups on file.

## 2018-05-24 NOTE — Assessment & Plan Note (Signed)
She was prescribed oxycodone but she was afraid to take it since I manage her pain medications.  She brings in the full bottle today (only 10 tablets) which shows she has not taken any.  She prefers to take what we prescribe for her instead.   She has only taken hydrocodone at night.  Plans to discard the oxycodone.  She was last seen for chronic pain management by me on 02/15/18.  Note reviewed.  Indication for chronic opioid: hip pain, back pain (DDD, stenosis), right knee (s/p right knee arthroscopy and multiple sessions of PT following surgery in 03/2017), write pain. Medication and dose: Tramadol - 1 tablet every 6 hours as needed for pain # pills per month: 60 Medication and dose: Norco 7.5- 325- 1 tab by mouth twice daily as needed for intractable pain # pills per month:60 Last UDS date: 02/15/18 Opioid Treatment Agreement signed (Y/N): Y Opioid Treatment Agreement last reviewed with patient:  Yes           [place] reviewed this encounter (include red flags):  Yes, no red flags

## 2018-05-24 NOTE — Patient Instructions (Addendum)
.  Great to see you. I will call you with your lab results from today and you can view them online.   Please sign release for [facility] Records :)  I've been thinking about you. Call hospice for grief counseling- [phone]

## 2018-05-25 DIAGNOSIS — M654 Radial styloid tenosynovitis [de Quervain]: Secondary | ICD-10-CM | POA: Diagnosis not present

## 2018-06-14 DIAGNOSIS — R102 Pelvic and perineal pain: Secondary | ICD-10-CM | POA: Diagnosis not present

## 2018-06-14 DIAGNOSIS — N3010 Interstitial cystitis (chronic) without hematuria: Secondary | ICD-10-CM | POA: Diagnosis not present

## 2018-06-19 ENCOUNTER — Telehealth: Payer: Self-pay | Admitting: Family Medicine

## 2018-06-19 NOTE — Telephone Encounter (Signed)
Copied from [place] [id number]. Topic: Quick Communication - Rx Refill/Question >> Jun 19, 2018  2:56 PM Virl Axe D wrote: Medication: HYDROcodone-acetaminophen ([place]) 7.5-325 MG tablet  Has the patient contacted their pharmacy? Yes.   (Agent: If no, request that the patient contact the pharmacy for the refill.) (Agent: If yes, when and what did the pharmacy advise?)  Preferred Pharmacy (with phone number or street name): [place], [place] - [address] [phone] (Phone) [phone] (Fax)    Agent: Please be advised that RX refills may take up to 3 business days. We ask that you follow-up with your pharmacy.

## 2018-06-20 MED ORDER — HYDROCODONE-ACETAMINOPHEN 7.5-325 MG PO TABS: ORAL_TABLET | ORAL | 0 refills | Status: DC

## 2018-06-20 NOTE — Addendum Note (Signed)
Addended by: Lucille Passy on: 06/20/2018 05:55 PM   Modules accepted: Orders

## 2018-06-20 NOTE — Telephone Encounter (Signed)
OK to refill? Last refill 05/24/18

## 2018-06-20 NOTE — Telephone Encounter (Signed)
eRx sent as requested. 

## 2018-06-21 MED FILL — HYDROCODON-APAP 7.5-325: 7.5-325 | 30 days supply | Qty: 60 | Fill #0

## 2018-06-28 DIAGNOSIS — M654 Radial styloid tenosynovitis [de Quervain]: Secondary | ICD-10-CM | POA: Diagnosis not present

## 2018-07-07 ENCOUNTER — Other Ambulatory Visit: Payer: Self-pay | Admitting: Family Medicine

## 2018-07-17 ENCOUNTER — Other Ambulatory Visit: Payer: Self-pay | Admitting: Family Medicine

## 2018-07-17 MED ORDER — HYDROCODONE-ACETAMINOPHEN 7.5-325 MG PO TABS: ORAL_TABLET | ORAL | 0 refills | Status: DC

## 2018-07-17 NOTE — Telephone Encounter (Signed)
Copied from [place] [id number]. Topic: Quick Communication - Rx Refill/Question >> Jul 17, 2018 12:40 PM Waldemar Dickens, Sade R wrote: Medication: HYDROcodone-acetaminophen ([place]) 7.5-325 MG tablet  Has the patient contacted their pharmacy?Yes  Preferred Pharmacy (with phone number or street name): [place], [place]. [phone] (Phone) [phone] (Fax)    Agent: Please be advised that RX refills may take up to 3 business days. We ask that you follow-up with your pharmacy.

## 2018-07-17 NOTE — Telephone Encounter (Signed)
TA-LOV: 10.30.19/UDS & CSC UTD/PMP ok no red flags/thx dmf

## 2018-07-20 MED FILL — HYDROCODON-APAP 7.5-325: 7.5-325 | 30 days supply | Qty: 60 | Fill #0

## 2018-08-21 ENCOUNTER — Other Ambulatory Visit: Payer: Self-pay | Admitting: Family Medicine

## 2018-08-21 MED FILL — HYDROCODON-APAP 7.5-325: 7.5-325 | 30 days supply | Qty: 60 | Fill #0

## 2018-08-21 NOTE — Telephone Encounter (Signed)
Requesting: Hydrocodone-APAP Contract: Yes UDS: UTD Last OV: 10.30.19 Next OV: Not scheduled/due in April Last Refill: 12.26.2019 [place] PMP: Reviewed/no red flags   Please advise/thx dmf

## 2018-09-10 ENCOUNTER — Other Ambulatory Visit: Payer: Self-pay | Admitting: Family Medicine

## 2018-09-20 ENCOUNTER — Other Ambulatory Visit: Payer: Self-pay | Admitting: Family Medicine

## 2018-09-20 NOTE — Telephone Encounter (Signed)
Refill   08/21/2018 [facility]    Heather Shaw, Marciano Sequin, MD  Family Medicine   Medication Refill  Reason for call      Requesting: Hydrocodone-APAP Contract: Yes UDS: UTD Last OV: 10.30.19 Next OV: Not scheduled/due in April Last Refill: 1.27.2020 [place] PMP: Reviewed/no red flags   Please advise/thx dmf

## 2018-09-21 MED FILL — HYDROCODON-APAP 7.5-325: 7.5-325 | 30 days supply | Qty: 60 | Fill #0

## 2018-10-04 ENCOUNTER — Other Ambulatory Visit: Payer: Self-pay | Admitting: Family Medicine

## 2018-10-04 ENCOUNTER — Other Ambulatory Visit: Payer: Self-pay

## 2018-10-04 MED ORDER — ATENOLOL 25 MG PO TABS: 25.0000 mg | ORAL_TABLET | Freq: Every day | ORAL | 1 refills | Status: DC

## 2018-10-17 ENCOUNTER — Other Ambulatory Visit: Payer: Self-pay | Admitting: Family Medicine

## 2018-10-17 NOTE — Telephone Encounter (Signed)
Last filled 09/21/2018 Last OV 05/07/2018

## 2018-10-18 MED FILL — HYDROCODON-APAP 7.5-325: 7.5-325 | 30 days supply | Qty: 60 | Fill #0

## 2018-10-18 NOTE — Telephone Encounter (Signed)
TA-Plz see refill req/last seen 10.2019/per [place] PMP pt is compliant without red flags/last filled 2.27.20/She wants this to the [facility]/thx dmf

## 2018-10-18 NOTE — Telephone Encounter (Signed)
TA-I just received this same request from [place] [facility] pharm/hold plz/thx dmf

## 2018-10-18 NOTE — Telephone Encounter (Signed)
TA-Plz see refill req/last seen 10.2019/per [place] PMP pt is compliant without red flags/last filled 2.27.20/thx dmf

## 2018-11-09 ENCOUNTER — Ambulatory Visit (INDEPENDENT_AMBULATORY_CARE_PROVIDER_SITE_OTHER): Payer: Medicare Other | Admitting: Family Medicine

## 2018-11-09 ENCOUNTER — Encounter: Payer: Self-pay | Admitting: Family Medicine

## 2018-11-09 VITALS — BP 156/81 | HR 76

## 2018-11-09 DIAGNOSIS — G8929 Other chronic pain: Secondary | ICD-10-CM | POA: Diagnosis not present

## 2018-11-09 MED ORDER — HYDROCODONE-ACETAMINOPHEN 7.5-325 MG PO TABS: ORAL_TABLET | ORAL | 0 refills | Status: DC

## 2018-11-09 NOTE — Progress Notes (Signed)
Virtual Visit via Video   I connected with Heather Shaw on 11/09/18 at 10:20 AM EDT by a video enabled telemedicine application and verified that I am speaking with the correct person using two identifiers. Location patient: Home Location provider: [facility], Office Persons participating in the virtual visit: Heather Shaw, Arnette Norris, MD   I discussed the limitations of evaluation and management by telemedicine and the availability of in person appointments. The patient expressed understanding and agreed to proceed.  Subjective:   HPI:   Indication for chronic opioid: hip pain, back pain (DDD, stenosis), right knee (s/p right knee arthroscopy and multiple sessions of PT following surgery in 03/2017) Medication and dose: Tramadol - 1 tablet every 6 hours as needed for pain # pills per month: 60 Medication and dose: Norco 7.5- 325- 1 tab by mouth twice daily as needed for intractable pain # pills per month:60 Last UDS date: 02/15/18 Opioid Treatment Agreement signed (Y/N): Y Opioid Treatment Agreement last reviewed with patient: Yes [facility] reviewed this encounter (include red flags): Yes, no red flags  ROS: See pertinent positives and negatives per HPI.  Patient Active Problem List   Diagnosis Date Noted  . Grief 05/24/2018  . Persistent cough 10/25/2017  . Right wrist tendonitis 08/24/2017  . Chronic pain 08/11/2017  . Hearing loss 11/23/2016  . SVT (supraventricular tachycardia) ([place])   . Angio-edema   . Carpal tunnel syndrome 09/19/2014  . Eczema 09/19/2014  . Chronic interstitial cystitis 05/20/2014  . Lumbar radiculopathy, chronic 12/31/2013  . DDD (degenerative disc disease), lumbosacral 04/07/2011  . Insomnia 04/06/2010  . Vitamin D deficiency 10/03/2009  . HLD (hyperlipidemia) 07/16/2009  . Essential hypertension 07/16/2009    Social History   Tobacco Use  . Smoking status: Former Smoker    Types: Cigarettes    Last attempt to quit:  05/17/1971    Years since quitting: 47.5  . Smokeless tobacco: Never Used  Substance Use Topics  . Alcohol use: No    Alcohol/week: 0.0 standard drinks    Current Outpatient Medications:  .  amLODipine (NORVASC) 10 MG tablet, TAKE 1 TABLET BY MOUTH EVERY DAY, Disp: 90 tablet, Rfl: 1 .  atenolol (TENORMIN) 25 MG tablet, Take 1 tablet (25 mg total) by mouth daily., Disp: 90 tablet, Rfl: 1 .  Fluocinonide Emulsified Base 0.05 % CREA, APPLY 1 APPLICATION TOPICALLY 2 (TWO) TIMES DAILY., Disp: 30 g, Rfl: 0 .  hydrochlorothiazide (HYDRODIURIL) 25 MG tablet, TAKE 1 TABLET BY MOUTH EVERY DAY, Disp: 90 tablet, Rfl: 1 .  HYDROcodone-acetaminophen (NORCO) 7.5-325 MG tablet, Take 1 bid prn moderate knee pain, Disp: 60 tablet, Rfl: 0 .  magnesium hydroxide (MILK OF MAGNESIA) 400 MG/5ML suspension, Take by mouth daily as needed for mild constipation., Disp: , Rfl:  .  meclizine (ANTIVERT) 12.5 MG tablet, Take 1 tablet (12.5 mg total) by mouth 3 (three) times daily as needed for dizziness., Disp: 30 tablet, Rfl: 0 .  Meth-Hyo-M Bl-Na Phos-Ph Sal (URO-MP) 118 MG CAPS, Take 1 capsule by mouth 3 (three) times daily as needed., Disp: , Rfl: 3 .  polyethylene glycol powder (GLYCOLAX/MIRALAX) powder, Dissolve 17 grams in at least 8 ounces water/juice and drink once daily, Disp: 527 g, Rfl: 3 .  traMADol (ULTRAM) 50 MG tablet, Take 1 tablet (50 mg total) by mouth every 6 (six) hours as needed. for pain, Disp: 60 tablet, Rfl: 2 .  HYDROcodone-acetaminophen (NORCO) 7.5-325 MG tablet, Take 1 bid prn moderate knee pain, Disp: 60  tablet, Rfl: 0 .  HYDROcodone-acetaminophen (NORCO) 7.5-325 MG tablet, Take 1 bid prn moderate knee pain, Disp: 60 tablet, Rfl: 0  Allergies  Allergen Reactions  . Contrast Media [Iodinated Diagnostic Agents]     ? angioedema  . Lisinopril     ? angioedema  . Allegra [Fexofenadine] Other (See Comments)    Makes pt nervous  . Codeine Nausea And Vomiting  . Nsaids     "stomach on fire  feeling"   . Prednisone     Causes elevation in blood pressure- ? All steroids cause same reaction  . Zithromax [Azithromycin] Other (See Comments)    Burns stomach  . Sulfa Antibiotics Rash    Objective:  There were no vitals taken for this visit.  VITALS: Per patient if applicable, see vitals. GENERAL: Alert, appears well and in no acute distress. HEENT: Atraumatic, conjunctiva clear, no obvious abnormalities on inspection of external nose and ears. NECK: Normal movements of the head and neck. CARDIOPULMONARY: No increased WOB. Speaking in clear sentences. I:E ratio WNL.  MS: Moves all visible extremities without noticeable abnormality. PSYCH: Pleasant and cooperative, well-groomed. Speech normal rate and rhythm. Affect is appropriate. Insight and judgement are appropriate. Attention is focused, linear, and appropriate.  NEURO: CN grossly intact. Oriented as arrived to appointment on time with no prompting. Moves both UE equally.  SKIN: No obvious lesions, wounds, erythema, or cyanosis noted on face or hands.  Assessment and Plan:   Heather Shaw was seen today for pain.  Diagnoses and all orders for this visit:  Other chronic pain  Other orders -     HYDROcodone-acetaminophen (NORCO) 7.5-325 MG tablet; Take 1 bid prn moderate knee pain -     HYDROcodone-acetaminophen (NORCO) 7.5-325 MG tablet; Take 1 bid prn moderate knee pain -     HYDROcodone-acetaminophen (NORCO) 7.5-325 MG tablet; Take 1 bid prn moderate knee pain    . Reviewed expectations re: course of current medical issues. . Discussed self-management of symptoms. . Outlined signs and symptoms indicating need for more acute intervention. . Patient verbalized understanding and all questions were answered. Marland Kitchen Health Maintenance issues including appropriate healthy diet, exercise, and smoking avoidance were discussed with patient. . See orders for this visit as documented in the electronic medical record.  Arnette Norris, MD  11/09/2018

## 2018-11-09 NOTE — Assessment & Plan Note (Signed)
Indication for chronic opioid: hip pain, back pain (DDD, stenosis), right knee (s/p right knee arthroscopy and multiple sessions of PT following surgery in 03/2017) Medication and dose: Tramadol - 1 tablet every 6 hours as needed for pain # pills per month: 60 Medication and dose: Norco 7.5- 325- 1 tab by mouth twice daily as needed for intractable pain # pills per month:60 Last UDS date: 02/15/18 Opioid Treatment Agreement signed (Y/N): Y Opioid Treatment Agreement last reviewed with patient: Yes [place] reviewed this encounter (include red flags): Yes, no red flags  eRx refills sent. She will return to office in July to update CSC/UDS. The patient indicates understanding of these issues and agrees with the plan.

## 2018-11-13 ENCOUNTER — Other Ambulatory Visit: Payer: Self-pay | Admitting: Family Medicine

## 2018-11-13 NOTE — Telephone Encounter (Signed)
TA-Plz see pt req for refill of Tramadol/LF: 2.20.20/LOV: 4.16.20/UDS & CSC are UTD/Per [place] PMP pt is compliant and without red flags/thx dmf

## 2018-11-13 NOTE — Telephone Encounter (Signed)
Requested medication (s) are due for refill today: yes  Requested medication (s) are on the active medication list: yes  Last refill:  09/14/2018  Future visit scheduled: no  Notes to clinic: not delegated   Requested Prescriptions  Pending Prescriptions Disp Refills   traMADol (ULTRAM) 50 MG tablet [Pharmacy Med Name: TRAMADOL HCL 50 MG TABLET] 60 tablet 2    Sig: Take 1 tablet (50 mg total) by mouth every 6 (six) hours as needed. for pain     Not Delegated - Analgesics:  Opioid Agonists Failed - 11/13/2018  1:32 PM      Failed - This refill cannot be delegated      Passed - Urine Drug Screen completed in last 360 days.      Passed - Valid encounter within last 6 months    Recent Outpatient Visits          4 days ago Other chronic pain   [facility], Marciano Sequin, MD   5 months ago Essential hypertension   [facility], Marciano Sequin, MD   9 months ago Other chronic pain   [facility], Marciano Sequin, MD   1 year ago Persistent cough   [facility], Marciano Sequin, MD   1 year ago Fever, unspecified fever cause   [facility] [address] Marciano Sequin, MD

## 2018-11-15 ENCOUNTER — Telehealth: Payer: Self-pay

## 2018-11-15 MED FILL — HYDROCODON-APAP 7.5-325: 7.5-325 | 30 days supply | Qty: 60 | Fill #0

## 2018-11-15 NOTE — Telephone Encounter (Signed)
LMOVM asking pt if she could come in for appt with Dr. Loletha Grayer on 4.23.20 as Dr. Deborra Medina is not in the office till Monday and feels that she needs to be seen in office/Dr. C has a 10am available/thx dmf

## 2018-11-16 ENCOUNTER — Encounter: Payer: Self-pay | Admitting: Family Medicine

## 2018-11-16 ENCOUNTER — Telehealth: Payer: Self-pay

## 2018-11-16 ENCOUNTER — Ambulatory Visit (INDEPENDENT_AMBULATORY_CARE_PROVIDER_SITE_OTHER): Payer: Medicare Other | Admitting: Family Medicine

## 2018-11-16 VITALS — BP 130/80 | HR 65 | Temp 97.4°F | Ht 65.0 in | Wt 214.2 lb

## 2018-11-16 DIAGNOSIS — R1031 Right lower quadrant pain: Secondary | ICD-10-CM | POA: Diagnosis not present

## 2018-11-16 DIAGNOSIS — Z8742 Personal history of other diseases of the female genital tract: Secondary | ICD-10-CM

## 2018-11-16 NOTE — Progress Notes (Signed)
Heather Shaw is a 78 y.o. female  Chief Complaint  Patient presents with  . Pain    right sided stomach pain/ constant pulsing pain( pt states feels like a heart beat)/ no diarrhea, no constipation/ has ovarian cyst/5 days    HPI: Heather Shaw is a 78 y.o. female patient of Dr. Deborra Medina who complains of 5 day h/o Rt lower abdominal pain that is constant. Intensity changes - worse when seated and slightly improved when laying down. She describes pain as "throbbing" or "pusling" pain and "kind of like a baby kicking". Pain has increased over the past 2-3 days.  She is moving her bowels normally - last BM was last PM. No blood in stool. No fever, chills. She endorses a decreased appetite and feels bloated after she eats.  She notes having an episode of dysuria last night (pt has a h/o interstitial cystitis) but nothing today. Denies flank pain, n/v, gross hematuria, urinary frequency or urgency. She states current symptoms do not feel like her typical IC flare.  She had been using heating pad.   Pt has a h/o ovarian cyst (unsure of which side) as well as interstitial cystitis.  Past Medical History:  Diagnosis Date  . Arthritis   . Bladder pain   . Bradycardia   . DDD (degenerative disc disease), lumbosacral   . Hyperlipidemia   . Hyperplastic colon polyp   . Hypertension   . IC (interstitial cystitis)   . Internal hemorrhoids   . Polyp of rectum   . PONV (postoperative nausea and vomiting)   . Squamous papilloma    in esophageal polyp  . Uterine fibroid   . Vertigo   . Wears glasses   . Wears partial dentures     Past Surgical History:  Procedure Laterality Date  . COLONOSCOPY W/ POLYPECTOMY  2009  . CYSTO WITH HYDRODISTENSION N/A 05/20/2014   Procedure: CYSTOSCOPY/HYDRODISTENSION, INSTILLATION OF CHLORPACTIN;  Surgeon: Bernestine Amass, MD;  Location: [facility];  Service: Urology;  Laterality: N/A;  . CYSTO/  HYDRODISTENTION/  INSTILLATION CLORPACTIC   09-21-2010  . DILATION AND CURETTAGE OF UTERUS  1988  . KNEE ARTHROSCOPY Right 11-18-2009  . TRIGGER FINGER RELEASE Right 08/23/2016   Procedure: RIGHT RING FINGER TRIGGER RELEASE;  Surgeon: Milly Jakob, MD;  Location: [place];  Service: Orthopedics;  Laterality: Right;    Social History   Socioeconomic History  . Marital status: Married    Spouse name: Not on file  . Number of children: 6  . Years of education: Not on file  . Highest education level: Not on file  Occupational History  . Occupation: housewife  Social Needs  . Financial resource strain: Not on file  . Food insecurity:    Worry: Not on file    Inability: Not on file  . Transportation needs:    Medical: Not on file    Non-medical: Not on file  Tobacco Use  . Smoking status: Former Smoker    Types: Cigarettes    Last attempt to quit: 05/17/1971    Years since quitting: 47.5  . Smokeless tobacco: Never Used  Substance and Sexual Activity  . Alcohol use: No    Alcohol/week: 0.0 standard drinks  . Drug use: No  . Sexual activity: Never  Lifestyle  . Physical activity:    Days per week: Not on file    Minutes per session: Not on file  . Stress: Not on file  Relationships  .  Social connections:    Talks on phone: Not on file    Gets together: Not on file    Attends religious service: Not on file    Active member of club or organization: Not on file    Attends meetings of clubs or organizations: Not on file    Relationship status: Not on file  . Intimate partner violence:    Fear of current or ex partner: Not on file    Emotionally abused: Not on file    Physically abused: Not on file    Forced sexual activity: Not on file  Other Topics Concern  . Not on file  Social History Narrative  . Not on file    Family History  Problem Relation Age of Onset  . Clotting disorder Mother 82  . COPD Father   . Heart attack Father 42  . Emphysema Father   . Stroke Paternal Aunt   .  Hypertension Sister   . Hypertension Brother      Immunization History  Administered Date(s) Administered  . Influenza Split 05/29/2012  . Influenza, High Dose Seasonal PF 05/24/2018  . Influenza,inj,Quad PF,6+ Mos 06/23/2016, 05/27/2017  . Pneumococcal Conjugate-13 11/03/2015  . Pneumococcal Polysaccharide-23 07/14/2006, 05/29/2012  . Td 07/11/2008  . Tdap 04/23/2014  . Zoster 10/27/2010    Outpatient Encounter Medications as of 11/16/2018  Medication Sig  . amLODipine (NORVASC) 10 MG tablet TAKE 1 TABLET BY MOUTH EVERY DAY  . atenolol (TENORMIN) 25 MG tablet Take 1 tablet (25 mg total) by mouth daily.  . Fluocinonide Emulsified Base 0.05 % CREA APPLY 1 APPLICATION TOPICALLY 2 (TWO) TIMES DAILY.  . hydrochlorothiazide (HYDRODIURIL) 25 MG tablet TAKE 1 TABLET BY MOUTH EVERY DAY  . HYDROcodone-acetaminophen (NORCO) 7.5-325 MG tablet Take 1 bid prn moderate knee pain  . HYDROcodone-acetaminophen (NORCO) 7.5-325 MG tablet Take 1 bid prn moderate knee pain  . HYDROcodone-acetaminophen (NORCO) 7.5-325 MG tablet Take 1 bid prn moderate knee pain  . magnesium hydroxide (MILK OF MAGNESIA) 400 MG/5ML suspension Take by mouth daily as needed for mild constipation.  . meclizine (ANTIVERT) 12.5 MG tablet Take 1 tablet (12.5 mg total) by mouth 3 (three) times daily as needed for dizziness.  . Meth-Hyo-M Bl-Na Phos-Ph Sal (URO-MP) 118 MG CAPS Take 1 capsule by mouth 3 (three) times daily as needed.  . polyethylene glycol powder (GLYCOLAX/MIRALAX) powder Dissolve 17 grams in at least 8 ounces water/juice and drink once daily  . traMADol (ULTRAM) 50 MG tablet TAKE 1 TABLET (50 MG TOTAL) BY MOUTH EVERY 6 (SIX) HOURS AS NEEDED. FOR PAIN   No facility-administered encounter medications on file as of 11/16/2018.      ROS: Pertinent positives and negatives noted in HPI. Remainder of ROS non-contributory    Allergies  Allergen Reactions  . Contrast Media [Iodinated Diagnostic Agents]     ?  angioedema  . Lisinopril     ? angioedema  . Allegra [Fexofenadine] Other (See Comments)    Makes pt nervous  . Codeine Nausea And Vomiting  . Nsaids     "stomach on fire feeling"   . Prednisone     Causes elevation in blood pressure- ? All steroids cause same reaction  . Zithromax [Azithromycin] Other (See Comments)    Burns stomach  . Sulfa Antibiotics Rash    BP 130/80   Pulse 65   Temp (!) 97.4 F (36.3 C) (Oral)   Ht 5\' 5"  (1.651 m)   Wt 214 lb 3.2 oz (97.2  kg)   SpO2 97%   BMI 35.64 kg/m   Physical Exam  Constitutional: She is oriented to person, place, and time. She appears well-developed and well-nourished. No distress.  Abdominal: Soft. Bowel sounds are normal. She exhibits no distension and no abdominal bruit. There is no hepatosplenomegaly. There is abdominal tenderness in the right lower quadrant. There is no rigidity, no rebound, no guarding, no CVA tenderness and no tenderness at McBurney's point.    Neurological: She is alert and oriented to person, place, and time.  Skin: Skin is warm and dry.  Psychiatric: She has a normal mood and affect. Her behavior is normal.     A/P:  1. RLQ abdominal pain - symptoms x 5 days, worse in past 2-3; afebrile with stable VS - possible etiologies include, but are not limited to, ruptured ovarian cyst, appendicitis, Rt iliac artery aneurysm, UTI, interstitial cystitis flare - [place] Art/Ven Flow Abd Pelv Doppler; Future - order placed STAT to [facility] as advised by clinic team lead Tanya Buckson - CBC - POCT Urinalysis Dipstick - pt unable to give urine sample - will contact pt once [place] result available  2. Personal history of ovarian cyst - pt believes she has B/L ovarian cysts - [place] Art/Ven Flow Abd Pelv Doppler; Future

## 2018-11-16 NOTE — Telephone Encounter (Signed)
Questions for Screening COVID-19  Symptom onset: n/a  Travel or Contacts: no contact/ no travel  During this illness, did/does the patient experience any of the following symptoms? Fever >100.72F []   Yes [x]   No []   Unknown Subjective fever (felt feverish) []   Yes [x]   No []   Unknown Chills []   Yes [x]   No []   Unknown Muscle aches (myalgia) []   Yes [x]   No []   Unknown Runny nose (rhinorrhea) []   Yes [x]   No []   Unknown Sore throat []   Yes [x]   No []   Unknown Cough (new onset or worsening of chronic cough) []   Yes [x]   No []   Unknown Shortness of breath (dyspnea) []   Yes [x]   No []   Unknown Nausea or vomiting []   Yes [x]   No []   Unknown Headache []   Yes [x]   No []   Unknown Abdominal pain  []   Yes [x]   No []   Unknown Diarrhea (?3 loose/looser than normal stools/24hr period) []   Yes [x]   No []   Unknown Other, specify:  Patient risk factors: Smoker? []   Current []   Former []   Never If female, currently pregnant? []   Yes []   No  Patient Active Problem List   Diagnosis Date Noted  . Grief 05/24/2018  . Persistent cough 10/25/2017  . Right wrist tendonitis 08/24/2017  . Chronic pain 08/11/2017  . Hearing loss 11/23/2016  . SVT (supraventricular tachycardia) ([place])   . Angio-edema   . Carpal tunnel syndrome 09/19/2014  . Eczema 09/19/2014  . Chronic interstitial cystitis 05/20/2014  . Lumbar radiculopathy, chronic 12/31/2013  . DDD (degenerative disc disease), lumbosacral 04/07/2011  . Insomnia 04/06/2010  . Vitamin D deficiency 10/03/2009  . HLD (hyperlipidemia) 07/16/2009  . Essential hypertension 07/16/2009    Plan:  []   High risk for COVID-19 with red flags go to ED (with CP, SOB, weak/lightheaded, or fever > 101.5). Call ahead.  []   High risk for COVID-19 but stable. Inform provider and coordinate time for [facility] visit.   []   No red flags but URI signs or symptoms okay for [facility] visit.

## 2018-11-17 ENCOUNTER — Telehealth: Payer: Self-pay

## 2018-11-17 ENCOUNTER — Other Ambulatory Visit: Payer: Self-pay | Admitting: Family Medicine

## 2018-11-17 DIAGNOSIS — Z8742 Personal history of other diseases of the female genital tract: Secondary | ICD-10-CM

## 2018-11-17 DIAGNOSIS — R1031 Right lower quadrant pain: Secondary | ICD-10-CM

## 2018-11-17 NOTE — Telephone Encounter (Signed)
error 

## 2018-11-20 ENCOUNTER — Ambulatory Visit (HOSPITAL_BASED_OUTPATIENT_CLINIC_OR_DEPARTMENT_OTHER)
Admission: RE | Admit: 2018-11-20 | Discharge: 2018-11-20 | Disposition: A | Discharge status: Home / Self Care | Payer: Medicare Other | Source: Ambulatory Visit | Attending: Family Medicine | Admitting: Family Medicine

## 2018-11-20 ENCOUNTER — Other Ambulatory Visit: Payer: Self-pay

## 2018-11-20 DIAGNOSIS — R1031 Right lower quadrant pain: Secondary | ICD-10-CM

## 2018-11-20 DIAGNOSIS — Z8742 Personal history of other diseases of the female genital tract: Secondary | ICD-10-CM | POA: Insufficient documentation

## 2018-11-20 DIAGNOSIS — K760 Fatty (change of) liver, not elsewhere classified: Secondary | ICD-10-CM | POA: Diagnosis not present

## 2018-11-20 DIAGNOSIS — N838 Other noninflammatory disorders of ovary, fallopian tube and broad ligament: Secondary | ICD-10-CM | POA: Diagnosis not present

## 2018-11-20 DIAGNOSIS — N839 Noninflammatory disorder of ovary, fallopian tube and broad ligament, unspecified: Secondary | ICD-10-CM | POA: Diagnosis not present

## 2018-11-22 ENCOUNTER — Encounter: Payer: Self-pay | Admitting: Family Medicine

## 2018-11-22 ENCOUNTER — Ambulatory Visit (HOSPITAL_BASED_OUTPATIENT_CLINIC_OR_DEPARTMENT_OTHER): Admission: RE | Admit: 2018-11-22 | Payer: Medicare Other | Source: Ambulatory Visit

## 2018-11-22 ENCOUNTER — Other Ambulatory Visit: Payer: Self-pay | Admitting: Family Medicine

## 2018-11-22 ENCOUNTER — Telehealth: Payer: Self-pay | Admitting: Family Medicine

## 2018-11-22 DIAGNOSIS — N838 Other noninflammatory disorders of ovary, fallopian tube and broad ligament: Secondary | ICD-10-CM

## 2018-11-22 NOTE — Telephone Encounter (Signed)
LM for pt to call back to discuss [place] results. MyChart message sent as well.

## 2018-11-23 ENCOUNTER — Ambulatory Visit
Admission: RE | Admit: 2018-11-23 | Discharge: 2018-11-23 | Disposition: A | Discharge status: Home / Self Care | Payer: Medicare Other | Source: Ambulatory Visit | Attending: Family Medicine | Admitting: Family Medicine

## 2018-11-23 ENCOUNTER — Other Ambulatory Visit: Payer: Self-pay

## 2018-11-23 DIAGNOSIS — N838 Other noninflammatory disorders of ovary, fallopian tube and broad ligament: Secondary | ICD-10-CM | POA: Diagnosis not present

## 2018-11-23 MED ORDER — GADOBENATE DIMEGLUMINE 529 MG/ML IV SOLN
20.0000 mL | Freq: Once | INTRAVENOUS | Status: AC | PRN
  Administered 2018-11-23: 20 mL via INTRAVENOUS

## 2018-11-24 ENCOUNTER — Encounter: Payer: Self-pay | Admitting: Family Medicine

## 2018-11-24 ENCOUNTER — Telehealth: Payer: Self-pay | Admitting: Family Medicine

## 2018-11-24 NOTE — Progress Notes (Signed)
Happy to do it. I'll call her this afternoon and let her know.

## 2018-11-24 NOTE — Telephone Encounter (Signed)
Called pt and reviewed MRI result. PCP aware of result as well. Will plan for ongoing surveillance of known/chronic adnexal masses and pt will f/u with PCP as needed.

## 2018-12-07 DIAGNOSIS — R102 Pelvic and perineal pain: Secondary | ICD-10-CM | POA: Diagnosis not present

## 2018-12-07 DIAGNOSIS — R300 Dysuria: Secondary | ICD-10-CM | POA: Diagnosis not present

## 2018-12-07 DIAGNOSIS — N3010 Interstitial cystitis (chronic) without hematuria: Secondary | ICD-10-CM | POA: Diagnosis not present

## 2018-12-12 MED FILL — HYDROCODON-APAP 7.5-325: 7.5-325 | 30 days supply | Qty: 60 | Fill #0

## 2018-12-15 DIAGNOSIS — M62838 Other muscle spasm: Secondary | ICD-10-CM | POA: Diagnosis not present

## 2018-12-15 DIAGNOSIS — M545 Low back pain: Secondary | ICD-10-CM | POA: Diagnosis not present

## 2018-12-15 DIAGNOSIS — R300 Dysuria: Secondary | ICD-10-CM | POA: Diagnosis not present

## 2018-12-15 DIAGNOSIS — R102 Pelvic and perineal pain: Secondary | ICD-10-CM | POA: Diagnosis not present

## 2018-12-15 DIAGNOSIS — M6281 Muscle weakness (generalized): Secondary | ICD-10-CM | POA: Diagnosis not present

## 2019-01-03 DIAGNOSIS — M62838 Other muscle spasm: Secondary | ICD-10-CM | POA: Diagnosis not present

## 2019-01-03 DIAGNOSIS — M545 Low back pain: Secondary | ICD-10-CM | POA: Diagnosis not present

## 2019-01-03 DIAGNOSIS — R102 Pelvic and perineal pain: Secondary | ICD-10-CM | POA: Diagnosis not present

## 2019-01-03 DIAGNOSIS — M6281 Muscle weakness (generalized): Secondary | ICD-10-CM | POA: Diagnosis not present

## 2019-01-03 DIAGNOSIS — R300 Dysuria: Secondary | ICD-10-CM | POA: Diagnosis not present

## 2019-01-09 MED FILL — HYDROCODON-APAP 7.5-325: 7.5-325 | 30 days supply | Qty: 60 | Fill #0

## 2019-01-31 DIAGNOSIS — R309 Painful micturition, unspecified: Secondary | ICD-10-CM | POA: Diagnosis not present

## 2019-01-31 DIAGNOSIS — Z1231 Encounter for screening mammogram for malignant neoplasm of breast: Secondary | ICD-10-CM | POA: Diagnosis not present

## 2019-01-31 DIAGNOSIS — Z6835 Body mass index (BMI) 35.0-35.9, adult: Secondary | ICD-10-CM | POA: Diagnosis not present

## 2019-01-31 DIAGNOSIS — R300 Dysuria: Secondary | ICD-10-CM | POA: Diagnosis not present

## 2019-01-31 DIAGNOSIS — Z01419 Encounter for gynecological examination (general) (routine) without abnormal findings: Secondary | ICD-10-CM | POA: Diagnosis not present

## 2019-02-12 ENCOUNTER — Other Ambulatory Visit: Payer: Self-pay | Admitting: Family Medicine

## 2019-02-13 MED FILL — HYDROCODON-APAP 7.5-325: 7.5-325 | 30 days supply | Qty: 60 | Fill #0

## 2019-02-20 DIAGNOSIS — R300 Dysuria: Secondary | ICD-10-CM | POA: Diagnosis not present

## 2019-02-20 DIAGNOSIS — R8279 Other abnormal findings on microbiological examination of urine: Secondary | ICD-10-CM | POA: Diagnosis not present

## 2019-02-20 DIAGNOSIS — M6281 Muscle weakness (generalized): Secondary | ICD-10-CM | POA: Diagnosis not present

## 2019-03-14 ENCOUNTER — Other Ambulatory Visit: Payer: Self-pay | Admitting: Family Medicine

## 2019-03-14 ENCOUNTER — Telehealth: Payer: Self-pay | Admitting: Family Medicine

## 2019-03-14 NOTE — Telephone Encounter (Signed)
ERROR

## 2019-03-16 MED ORDER — HYDROCODONE-ACETAMINOPHEN 7.5-325 MG PO TABS: ORAL_TABLET | ORAL | 0 refills | Status: DC

## 2019-03-16 MED FILL — HYDROCODON-APAP 7.5-325: 7.5-325 | 30 days supply | Qty: 60 | Fill #0

## 2019-03-16 NOTE — Telephone Encounter (Signed)
TA-Pt last seen in April/Per [place] PMP pt is compliant without red flags/I have prepared 3 months and pended for your approval/thx dmf

## 2019-03-18 ENCOUNTER — Other Ambulatory Visit: Payer: Self-pay | Admitting: Family Medicine

## 2019-04-05 ENCOUNTER — Other Ambulatory Visit: Payer: Self-pay | Admitting: Family Medicine

## 2019-04-06 ENCOUNTER — Telehealth (INDEPENDENT_AMBULATORY_CARE_PROVIDER_SITE_OTHER): Payer: Medicare Other | Admitting: Family Medicine

## 2019-04-06 ENCOUNTER — Other Ambulatory Visit: Payer: Self-pay

## 2019-04-06 ENCOUNTER — Encounter: Payer: Self-pay | Admitting: Family Medicine

## 2019-04-06 DIAGNOSIS — B852 Pediculosis, unspecified: Secondary | ICD-10-CM | POA: Insufficient documentation

## 2019-04-06 MED ORDER — MALATHION 0.5 % EX LOTN: TOPICAL_LOTION | CUTANEOUS | 0 refills | Status: DC

## 2019-04-06 NOTE — Assessment & Plan Note (Addendum)
-  Rx for ovide sent in.  Discussed that she may need another application after 1 week if not resolving.  She should wash bedding, towels etc on highest heat setting or bag up x2 weeks.

## 2019-04-06 NOTE — Patient Instructions (Signed)
Lice, Adult         Lice are tiny insects with claws on the ends of their legs. They are small parasites that live on the human body. A parasite is an insect that lives off another animal and cannot survive without it. Lice often make their home in a person's hair, such as hair on the head or in the pubic area. Pubic lice are sometimes referred to as crabs. Lice hatch from little round eggs, which are attached to the base of hairs. Lice eggs are also called nits.  Lice can spread from one person to another. Lice crawl. They do not fly or jump. Lice cause skin irritation and itching in the area of the infested hair. Although having lice can be annoying, it is not dangerous. Lice do not spread diseases. Treatment will usually clear up the symptoms within a few days.  What are the causes?  This condition may be caused by:   Having very close contact with an infested person.   Sharing infested items that touch your skin and hair. These include personal items, such as hats, combs, brushes, towels, clothing, pillowcases, or sheets.  Pubic lice are spread through sexual contact.  What increases the risk?  Although having lice is more common among young children, anyone can get lice. Lice tend to thrive in warm weather, so that type of weather increases the risk.  What are the signs or symptoms?  Symptoms of this condition include:   Itchiness in the affected area.   Skin irritation.   Feeling of something moving in the hair.   Rash or sores on the skin.   Tiny flakes or sacs near the scalp. These may be white, yellow, or tan.   Tiny bugs crawling on the hair or scalp.  How is this diagnosed?  This condition is diagnosed based on:   Your symptoms.   A physical exam.  ? Your health care provider will examine the affected area closely for live lice, tiny eggs (nits), and empty egg cases.  ? Eggs are typically yellow or tan in color. Empty egg cases are whitish. Lice are gray or brown.  How is this  treated?  Treatment for this condition includes:   Using a hair rinse that contains a mild insecticide to kill lice. Your health care provider will recommend a prescription or over-the-counter rinse.   Removing lice, eggs, and egg cases by using a comb or tweezers.   Washing and bagging your clothing and bedding.  Pregnant women should not use medicated shampoo or cream without first talking to their health care provider.  Follow these instructions at home:  Using medicated rinse  Apply medicated rinse as told by your health care provider. Follow the label instructions carefully. General instructions for applying rinses may include these steps:  1. Put on an old shirt or underwear, or use an old towel in case of staining from the rinse.  2. Wash and towel-dry your head or pubic area before applying the rinse if directed to do so.  3. When your hair is dry, apply the rinse. Leave the rinse in your hair for the amount of time specified in the instructions.  4. Rinse the area with water.  5. Comb your wet hair with a fine-tooth comb. Comb it close to the skin and down to the ends, removing any lice, eggs, or egg cases. A lice comb may be included with the medicated rinse.  6. Do not wash the infested   remaining lice should be moving more slowly. 8. Repeat the treatment if necessary in 7-10 days. General instructions  Remove any remaining lice, eggs, or egg cases using a fine-tooth comb.  Use hot water to wash all towels, hats, scarves, jackets, bedding, and clothing that you have recently used.  Put any non-washable items that may have been exposed into plastic bags. Keep the bags closed for 2 weeks.  Soak all combs and brushes in hot water for 10 minutes.  Vacuum furniture to remove any loose  hair. There is no need to use chemicals, which can be poisonous (toxic). Lice survive for only 1-2 days away from human skin. Eggs may survive for only 1 week.  For pubic lice, tell any sexual partners to seek treatment.  For head lice, ask your health care provider if other family members or close contacts should be examined or treated as well.  Keep all follow-up visits as told by your health care provider. This is important. Contact a health care provider if:  You develop sores that look infected.  Your rash or sores do not go away in 1 week.  The lice or eggs return or do not go away in spite of treatment. Summary  Lice are tiny parasitic insects that live on the human body. A parasite is an insect that lives off another animal and cannot survive without it.  Lice can spread from one person to another through close contact with an infested person or by sharing personal items, such as combs, brushes, or hats.  Lice can be treated with a medicated rinse. Follow your health care provider's instructions, or instructions on the label, if you are being treated with this medicine.  Ask your health care provider if your family members or close contacts should be treated for lice. This information is not intended to replace advice given to you by your health care provider. Make sure you discuss any questions you have with your health care provider. Document Released: 07/12/2005 Document Revised: 12/22/2017 Document Reviewed: 07/21/2016 Elsevier Patient Education  2020 [employer].

## 2019-04-06 NOTE — Progress Notes (Signed)
Heather Shaw - 78 y.o. female MRN [id number]  Date of birth: [birth year]   This visit type was conducted due to national recommendations for restrictions regarding the COVID-19 Pandemic (e.g. social distancing).  This format is felt to be most appropriate for this patient at this time.  All issues noted in this document were discussed and addressed.  No physical exam was performed (except for noted visual exam findings with Video Visits).  I discussed the limitations of evaluation and management by telemedicine and the availability of in person appointments. The patient expressed understanding and agreed to proceed.  I connected with@ on 04/06/19 at 11:20 AM EDT by a video enabled telemedicine application and verified that I am speaking with the correct person using two identifiers.   Patient Location: Home [place] [id number]   Provider location:   [facility]  Chief Complaint  Patient presents with  . Head Lice    Pt agrees to virtual visit. She has seen it and had her daughter check. She is wanting a Rx for this.     HPI  Heather Shaw is a 78 y.o. female who presents via audio/video conferencing for a telehealth visit today.  She is connecting today to discuss treatment for lice.  She report that her grandchildren were recently diagnosed with lice and she began having scalp itching a couple of days ago.  Her daughter checked and found that she had lice as well.  Tried coconut oil last night which helped some with itching.  She has not tried anything OTC such as RID.    ROS:  A comprehensive ROS was completed and negative except as noted per HPI  Past Medical History:  Diagnosis Date  . Arthritis   . Bladder pain   . Bradycardia   . DDD (degenerative disc disease), lumbosacral   . Hyperlipidemia   . Hyperplastic colon polyp   . Hypertension   . IC (interstitial cystitis)   . Internal hemorrhoids   . Polyp of rectum   . PONV  (postoperative nausea and vomiting)   . Squamous papilloma    in esophageal polyp  . Uterine fibroid   . Vertigo   . Wears glasses   . Wears partial dentures     Past Surgical History:  Procedure Laterality Date  . COLONOSCOPY W/ POLYPECTOMY  2009  . CYSTO WITH HYDRODISTENSION N/A 05/20/2014   Procedure: CYSTOSCOPY/HYDRODISTENSION, INSTILLATION OF CHLORPACTIN;  Surgeon: Bernestine Amass, MD;  Location: [facility];  Service: Urology;  Laterality: N/A;  . CYSTO/  HYDRODISTENTION/  INSTILLATION CLORPACTIC  09-21-2010  . DILATION AND CURETTAGE OF UTERUS  1988  . KNEE ARTHROSCOPY Right 11-18-2009  . TRIGGER FINGER RELEASE Right 08/23/2016   Procedure: RIGHT RING FINGER TRIGGER RELEASE;  Surgeon: Milly Jakob, MD;  Location: [place];  Service: Orthopedics;  Laterality: Right;    Family History  Problem Relation Age of Onset  . Clotting disorder Mother 43  . COPD Father   . Heart attack Father 53  . Emphysema Father   . Stroke Paternal Aunt   . Hypertension Sister   . Hypertension Brother     Social History   Socioeconomic History  . Marital status: Married    Spouse name: Not on file  . Number of children: 6  . Years of education: Not on file  . Highest education level: Not on file  Occupational History  . Occupation: housewife  Social Needs  .  Financial resource strain: Not on file  . Food insecurity    Worry: Not on file    Inability: Not on file  . Transportation needs    Medical: Not on file    Non-medical: Not on file  Tobacco Use  . Smoking status: Former Smoker    Types: Cigarettes    Quit date: 05/17/1971    Years since quitting: 47.9  . Smokeless tobacco: Never Used  Substance and Sexual Activity  . Alcohol use: No    Alcohol/week: 0.0 standard drinks  . Drug use: No  . Sexual activity: Never  Lifestyle  . Physical activity    Days per week: Not on file    Minutes per session: Not on file  . Stress: Not on file   Relationships  . Social Herbalist on phone: Not on file    Gets together: Not on file    Attends religious service: Not on file    Active member of club or organization: Not on file    Attends meetings of clubs or organizations: Not on file    Relationship status: Not on file  . Intimate partner violence    Fear of current or ex partner: Not on file    Emotionally abused: Not on file    Physically abused: Not on file    Forced sexual activity: Not on file  Other Topics Concern  . Not on file  Social History Narrative  . Not on file     Current Outpatient Medications:  .  amLODipine (NORVASC) 10 MG tablet, TAKE 1 TABLET BY MOUTH EVERY DAY, Disp: 90 tablet, Rfl: 1 .  atenolol (TENORMIN) 25 MG tablet, Take 1 tablet (25 mg total) by mouth daily., Disp: 90 tablet, Rfl: 1 .  Fluocinonide Emulsified Base 0.05 % CREA, APPLY 1 APPLICATION TOPICALLY 2 (TWO) TIMES DAILY., Disp: 30 g, Rfl: 0 .  hydrochlorothiazide (HYDRODIURIL) 25 MG tablet, TAKE 1 TABLET BY MOUTH EVERY DAY, Disp: 90 tablet, Rfl: 1 .  HYDROcodone-acetaminophen (NORCO) 7.5-325 MG tablet, TAKE 1 TABLET BY MOUTH TWICE DAILY AS NEEDED FOR MODERATE KNEE PAIN., Disp: 60 tablet, Rfl: 0 .  HYDROcodone-acetaminophen (NORCO) 7.5-325 MG tablet, Take 1 bid prn moderate knee pain, Disp: 60 tablet, Rfl: 0 .  HYDROcodone-acetaminophen (NORCO) 7.5-325 MG tablet, Take 1 bid prn moderate knee pain, Disp: 60 tablet, Rfl: 0 .  magnesium hydroxide (MILK OF MAGNESIA) 400 MG/5ML suspension, Take by mouth daily as needed for mild constipation., Disp: , Rfl:  .  meclizine (ANTIVERT) 12.5 MG tablet, Take 1 tablet (12.5 mg total) by mouth 3 (three) times daily as needed for dizziness., Disp: 30 tablet, Rfl: 0 .  Meth-Hyo-M Bl-Na Phos-Ph Sal (URO-MP) 118 MG CAPS, Take 1 capsule by mouth 3 (three) times daily as needed., Disp: , Rfl: 3 .  polyethylene glycol powder (GLYCOLAX/MIRALAX) powder, Dissolve 17 grams in at least 8 ounces water/juice and  drink once daily, Disp: 527 g, Rfl: 3 .  traMADol (ULTRAM) 50 MG tablet, TAKE 1 TABLET (50 MG TOTAL) BY MOUTH EVERY 6 (SIX) HOURS AS NEEDED. FOR PAIN, Disp: 60 tablet, Rfl: 5 .  malathion (OVIDE) 0.5 % lotion, Apply to dry hair and massage into scalp. Allow to dry naturally. Wash hair and scalp after 8-12 hours. Repeat after 1 week if needed., Disp: 59 mL, Rfl: 0  EXAM:  VITALS per patient if applicable: There were no vitals taken for this visit.  GENERAL: alert, oriented, appears well and in no  acute distress  HEENT: atraumatic, conjunttiva clear, no obvious abnormalities on inspection of external nose and ears  NECK: normal movements of the head and neck  LUNGS: on inspection no signs of respiratory distress, breathing rate appears normal, no obvious gross SOB, gasping or wheezing  CV: no obvious cyanosis  MS: moves all visible extremities without noticeable abnormality  PSYCH/NEURO: pleasant and cooperative, no obvious depression or anxiety, speech and thought processing grossly intact  ASSESSMENT AND PLAN:  Discussed the following assessment and plan:  Pediculosis -Rx for ovide sent in.  Discussed that she may need another application after 1 week if not resolving.  She should wash bedding, towels etc on highest heat setting or bag up x2 weeks.         I discussed the assessment and treatment plan with the patient. The patient was provided an opportunity to ask questions and all were answered. The patient agreed with the plan and demonstrated an understanding of the instructions.   The patient was advised to call back or seek an in-person evaluation if the symptoms worsen or if the condition fails to improve as anticipated.    Luetta Nutting, DO

## 2019-04-13 DIAGNOSIS — N390 Urinary tract infection, site not specified: Secondary | ICD-10-CM | POA: Diagnosis not present

## 2019-04-13 DIAGNOSIS — N3011 Interstitial cystitis (chronic) with hematuria: Secondary | ICD-10-CM | POA: Diagnosis not present

## 2019-04-13 DIAGNOSIS — R309 Painful micturition, unspecified: Secondary | ICD-10-CM | POA: Diagnosis not present

## 2019-04-13 MED FILL — HYDROCODON-APAP 7.5-325: 7.5-325 | 30 days supply | Qty: 60 | Fill #0

## 2019-05-14 MED FILL — HYDROCODON-APAP 7.5-325: 7.5-325 | 30 days supply | Qty: 60 | Fill #0

## 2019-05-29 DIAGNOSIS — Z23 Encounter for immunization: Secondary | ICD-10-CM | POA: Diagnosis not present

## 2019-06-18 ENCOUNTER — Other Ambulatory Visit: Payer: Self-pay | Admitting: Family Medicine

## 2019-06-18 ENCOUNTER — Telehealth: Payer: Self-pay

## 2019-06-18 MED ORDER — HYDROCODONE-ACETAMINOPHEN 7.5-325 MG PO TABS: ORAL_TABLET | ORAL | 0 refills | Status: DC

## 2019-06-18 MED FILL — HYDROCODON-APAP 7.5-325: 7.5-325 | 30 days supply | Qty: 60 | Fill #0

## 2019-06-18 NOTE — Telephone Encounter (Signed)
I am so sorry- I sent it the [facility] in [place] but I did refill it.  Does she want me to change that?

## 2019-06-18 NOTE — Telephone Encounter (Signed)
Last VV 04/06/19 w/[facility] Last fill 03/16/19  #60/0

## 2019-06-18 NOTE — Telephone Encounter (Signed)
Copied from [place] [id number]. Topic: Quick Communication - Rx Refill/Question >> Jun 18, 2019  9:22 AM Carolyn Stare wrote: Medication HYDROcodone-acetaminophen (NORCO) 7.5-325 MG tablet   Preferred [facility]   Agent: Please be advised that RX refills may take up to 3 business days. We ask that you follow-up with your pharmacy.

## 2019-06-18 NOTE — Telephone Encounter (Signed)
I spoke with pt and she would like for Dr. Deborra Medina to change pharmacy to [facility].

## 2019-06-18 NOTE — Addendum Note (Signed)
Addended by: Lucille Passy on: 06/18/2019 10:41 AM   Modules accepted: Orders

## 2019-06-19 NOTE — Telephone Encounter (Signed)
eRx resent to [facility] yesterday per pt request.

## 2019-06-20 ENCOUNTER — Other Ambulatory Visit: Payer: Self-pay

## 2019-07-07 NOTE — Progress Notes (Signed)
Virtual Visit via Video   Due to the COVID-19 pandemic, this visit was completed with telemedicine (audio/video) technology to reduce patient and provider exposure as well as to preserve personal protective equipment.   I connected with Heather Shaw by a video enabled telemedicine application and verified that I am speaking with the correct person using two identifiers. Location patient: Home Location provider: [facility], Office Persons participating in the virtual visit: Heather Shaw, Arnette Norris, MD   I discussed the limitations of evaluation and management by telemedicine and the availability of in person appointments. The patient expressed understanding and agreed to proceed.  Interactive audio and video telecommunications were attempted between this provider and patient, however failed, due to patient having technical difficulties OR patient did not have access to video capability.  We continued and completed visit with audio only.   Care Team   Patient Care Team: Lucille Passy, MD as PCP - General Rana Snare, MD as Consulting Physician (Urology) Maisie Fus, MD as Consulting Physician (Obstetrics and Gynecology) Juluis Rainier as Consulting Physician (Optometry)  Subjective:   HPI: Patient c/o pain in both legs, feeling heavy and have pressure when she stands up or walk.  Patient explains that she is also having lower back pain x3week to month.   MR  - MR LUMBAR SPINE WO CONTRAST  - Oct 16 2012 12:00PM   RESULT:     Comparison: None   Technique: Standard lumbar spine protocol, without administration of IV  contrast.   Findings:  Bone marrow endplate reactive changes are seen at L1-L2 and to a lesser  extent L3-L4, L4-L5. There is normal alignment in the anterior posterior  dimension. Vertebral body heights are relatively preserved. The conus  termination is normal. Mild posterior disc bulge is seen at T11-T12 on the  sagittal images only.    T12-L1: No significant disc bulge or neuroforaminal narrowing.   L1-L2: Mild posterior disc bulge causes flattening of the thecal sac. No  neuroforaminal narrowing.   L2-L3: Mild posterior disc bulge causes flattening of the thecal sac.  There  is mild degenerative facet disease. Overall, there is minimal canal  stenosis. No neuroforaminal narrowing.   L3-L4: Mild posterior disc bulge causes flattening of the thecal sac.  There  is mild degenerative facet disease. No neuroforaminal narrowing.   L4-L5: Mild posterior disc bulge causes flattening of the thecal sac.  There  is mild right neuroforaminal narrowing. Mild degenerative facet disease.   L5-S1: Mild posterior disc bulge causes minimal flattening of the ventral  thecal sac. No neuroforaminal narrowing. Mild degenerative facet disease.   IMPRESSION:  Multilevel degenerative disc and facet disease, with minimal canal  stenosis  at L2-L3.   Indication for chronic opioid: hip pain, back pain (DDD, stenosis), right knee (s/p right knee arthroscopy and multiple sessions of PT following surgery in 03/2017) Medication and dose: Tramadol - 1 tablet every 6 hours as needed for pain # pills per month: 60 Medication and dose: Norco 7.5- 325- 1 tab by mouth twice daily as needed for intractable pain # pills per month:60 Last UDS date: 02/15/18 Opioid Treatment Agreement signed (Y/N): Y Opioid Treatment Agreement last reviewed with patient: Yes [employer] reviewed this encounter (include red flags): Yes, no red flags   Review of Systems  Constitutional: Negative for fever and malaise/fatigue.  HENT: Negative for congestion and hearing loss.   Eyes: Negative for blurred vision, discharge and redness.  Respiratory: Negative for cough and  shortness of breath.   Cardiovascular: Negative for chest pain, palpitations and leg swelling.  Gastrointestinal: Negative for abdominal pain and heartburn.  Genitourinary:  Negative for dysuria.  Musculoskeletal: Positive for back pain, joint pain and myalgias. Negative for falls.  Skin: Negative for rash.  Neurological: Negative for tingling, loss of consciousness and headaches.  Endo/Heme/Allergies: Does not bruise/bleed easily.  Psychiatric/Behavioral: Negative for depression.     Patient Active Problem List   Diagnosis Date Noted  . Pediculosis 04/06/2019  . Grief 05/24/2018  . Persistent cough 10/25/2017  . Right wrist tendonitis 08/24/2017  . Chronic pain 08/11/2017  . Hearing loss 11/23/2016  . SVT (supraventricular tachycardia) ([place])   . Angio-edema   . Carpal tunnel syndrome 09/19/2014  . Eczema 09/19/2014  . Chronic interstitial cystitis 05/20/2014  . Lumbar radiculopathy, chronic 12/31/2013  . DDD (degenerative disc disease), lumbosacral 04/07/2011  . Insomnia 04/06/2010  . Vitamin D deficiency 10/03/2009  . HLD (hyperlipidemia) 07/16/2009  . Essential hypertension 07/16/2009    Social History   Tobacco Use  . Smoking status: Former Smoker    Types: Cigarettes    Quit date: 05/17/1971    Years since quitting: 48.1  . Smokeless tobacco: Never Used  Substance Use Topics  . Alcohol use: No    Alcohol/week: 0.0 standard drinks    Current Outpatient Medications:  .  amLODipine (NORVASC) 10 MG tablet, TAKE 1 TABLET BY MOUTH EVERY DAY, Disp: 90 tablet, Rfl: 1 .  atenolol (TENORMIN) 25 MG tablet, TAKE 1 TABLET BY MOUTH EVERY DAY, Disp: 90 tablet, Rfl: 1 .  Fluocinonide Emulsified Base 0.05 % CREA, APPLY 1 APPLICATION TOPICALLY 2 (TWO) TIMES DAILY., Disp: 30 g, Rfl: 0 .  hydrochlorothiazide (HYDRODIURIL) 25 MG tablet, TAKE 1 TABLET BY MOUTH EVERY DAY, Disp: 90 tablet, Rfl: 1 .  HYDROcodone-acetaminophen (NORCO) 7.5-325 MG tablet, TAKE 1 TABLET BY MOUTH TWICE DAILY AS NEEDED FOR MODERATE KNEE PAIN., Disp: 60 tablet, Rfl: 0 .  magnesium hydroxide (MILK OF MAGNESIA) 400 MG/5ML suspension, Take by mouth daily as needed for mild  constipation., Disp: , Rfl:  .  malathion (OVIDE) 0.5 % lotion, Apply to dry hair and massage into scalp. Allow to dry naturally. Wash hair and scalp after 8-12 hours. Repeat after 1 week if needed., Disp: 59 mL, Rfl: 0 .  meclizine (ANTIVERT) 12.5 MG tablet, Take 1 tablet (12.5 mg total) by mouth 3 (three) times daily as needed for dizziness., Disp: 30 tablet, Rfl: 0 .  Meth-Hyo-M Bl-Na Phos-Ph Sal (URO-MP) 118 MG CAPS, Take 1 capsule by mouth 3 (three) times daily as needed., Disp: , Rfl: 3 .  polyethylene glycol powder (GLYCOLAX/MIRALAX) powder, Dissolve 17 grams in at least 8 ounces water/juice and drink once daily, Disp: 527 g, Rfl: 3 .  traMADol (ULTRAM) 50 MG tablet, TAKE 1 TABLET (50 MG TOTAL) BY MOUTH EVERY 6 (SIX) HOURS AS NEEDED. FOR PAIN, Disp: 60 tablet, Rfl: 5  Allergies  Allergen Reactions  . Contrast Media [Iodinated Diagnostic Agents]     ? angioedema  . Lisinopril     ? angioedema  . Allegra [Fexofenadine] Other (See Comments)    Makes pt nervous  . Codeine Nausea And Vomiting  . Nsaids     "stomach on fire feeling"   . Prednisone     Causes elevation in blood pressure- ? All steroids cause same reaction  . Zithromax [Azithromycin] Other (See Comments)    Burns stomach  . Sulfa Antibiotics Rash    Objective:  Ht 5\' 5"  (1.651 m)   BMI 35.64 kg/m   VITALS: Per patient if applicable, see vitals. GENERAL: Alert, appears well and in no acute distress. HEENT: Atraumatic, conjunctiva clear, no obvious abnormalities on inspection of external nose and ears. NECK: Normal movements of the head and neck. CARDIOPULMONARY: No increased WOB. Speaking in clear sentences. I:E ratio WNL.  MS: Moves all visible extremities without noticeable abnormality. PSYCH: Pleasant and cooperative, well-groomed. Speech normal rate and rhythm. Affect is appropriate. Insight and judgement are appropriate. Attention is focused, linear, and appropriate.  NEURO: CN grossly intact. Oriented as  arrived to appointment on time with no prompting. Moves both UE equally.  SKIN: No obvious lesions, wounds, erythema, or cyanosis noted on face or hands.  Depression screen [facility] 2/9 11/16/2017 11/08/2016 11/03/2015  Decreased Interest 0 0 0  Down, Depressed, Hopeless 0 0 0  PHQ - 2 Score 0 0 0  Some recent data might be hidden     . COVID-19 Education: The signs and symptoms of COVID-19 were discussed with the patient and how to seek care for testing if needed. The importance of social distancing was discussed today. . Reviewed expectations re: course of current medical issues. . Discussed self-management of symptoms. . Outlined signs and symptoms indicating need for more acute intervention. . Patient verbalized understanding and all questions were answered. [facility] Health Maintenance issues including appropriate healthy diet, exercise, and smoking avoidance were discussed with patient. . See orders for this visit as documented in the electronic medical record.  Arnette Norris, MD  Records requested if needed. Time spent: 25 minutes, of which >50% was spent in obtaining information about her symptoms, reviewing her previous labs, evaluations, and treatments, counseling her about her condition (please see the discussed topics above), and developing a plan to further investigate it; she had a number of questions which I addressed.   Lab Results  Component Value Date   WBC 9.4 05/24/2018   HGB 14.6 05/24/2018   HCT 42.1 05/24/2018   PLT 244.0 05/24/2018   GLUCOSE 112 (H) 05/24/2018   CHOL 218 (H) 05/24/2018   TRIG 231.0 (H) 05/24/2018   HDL 39.60 05/24/2018   LDLDIRECT 158.0 05/24/2018   LDLCALC 134 (H) 12/29/2015   ALT 11 05/24/2018   AST 14 05/24/2018   NA 139 05/24/2018   K 3.7 05/24/2018   CL 100 05/24/2018   CREATININE 0.77 05/24/2018   BUN 12 05/24/2018   CO2 30 05/24/2018   TSH 1.41 05/24/2018   HGBA1C 5.9 (H) 12/29/2015    Lab Results  Component Value Date   TSH 1.41  05/24/2018   Lab Results  Component Value Date   WBC 9.4 05/24/2018   HGB 14.6 05/24/2018   HCT 42.1 05/24/2018   MCV 85.1 05/24/2018   PLT 244.0 05/24/2018   Lab Results  Component Value Date   NA 139 05/24/2018   K 3.7 05/24/2018   CO2 30 05/24/2018   GLUCOSE 112 (H) 05/24/2018   BUN 12 05/24/2018   CREATININE 0.77 05/24/2018   BILITOT 0.4 05/24/2018   ALKPHOS 48 05/24/2018   AST 14 05/24/2018   ALT 11 05/24/2018   PROT 6.9 05/24/2018   ALBUMIN 4.2 05/24/2018   CALCIUM 9.6 05/24/2018   ANIONGAP 8 08/20/2016   GFR 77.60 05/24/2018   Lab Results  Component Value Date   CHOL 218 (H) 05/24/2018   Lab Results  Component Value Date   HDL 39.60 05/24/2018   Lab Results  Component Value  Date   LDLCALC 134 (H) 12/29/2015   Lab Results  Component Value Date   TRIG 231.0 (H) 05/24/2018   Lab Results  Component Value Date   CHOLHDL 6 05/24/2018   Lab Results  Component Value Date   HGBA1C 5.9 (H) 12/29/2015       Assessment & Plan:   Problem List Items Addressed This Visit    None      I am having [place] maintain her magnesium hydroxide, polyethylene glycol powder, Uro-MP, Fluocinonide Emulsified Base, meclizine, traMADol, amLODipine, atenolol, hydrochlorothiazide, malathion, and HYDROcodone-acetaminophen.  No orders of the defined types were placed in this encounter.    Arnette Norris, MD

## 2019-07-09 ENCOUNTER — Encounter: Payer: Self-pay | Admitting: Family Medicine

## 2019-07-09 ENCOUNTER — Telehealth (INDEPENDENT_AMBULATORY_CARE_PROVIDER_SITE_OTHER): Payer: Medicare Other | Admitting: Family Medicine

## 2019-07-09 VITALS — Ht 65.0 in

## 2019-07-09 DIAGNOSIS — N3010 Interstitial cystitis (chronic) without hematuria: Secondary | ICD-10-CM

## 2019-07-09 DIAGNOSIS — M5137 Other intervertebral disc degeneration, lumbosacral region: Secondary | ICD-10-CM

## 2019-07-09 DIAGNOSIS — M4807 Spinal stenosis, lumbosacral region: Secondary | ICD-10-CM | POA: Diagnosis not present

## 2019-07-09 DIAGNOSIS — M79605 Pain in left leg: Secondary | ICD-10-CM | POA: Diagnosis not present

## 2019-07-09 DIAGNOSIS — M79604 Pain in right leg: Secondary | ICD-10-CM | POA: Insufficient documentation

## 2019-07-09 DIAGNOSIS — G8929 Other chronic pain: Secondary | ICD-10-CM

## 2019-07-09 NOTE — Assessment & Plan Note (Signed)
Legs feel heavy- goes from hip all the way to her knees with h/o lumbar stenosis.  Last MRI done in 2014.  Will re order MRI and refer to neurosurgery.  She is using her narcotics sparingly. The patient indicates understanding of these issues and agrees with the plan.

## 2019-07-09 NOTE — Assessment & Plan Note (Signed)
ndication for chronic opioid: hip pain, back pain (DDD, stenosis), right knee (s/p right knee arthroscopy and multiple sessions of PT following surgery in 03/2017) Medication and dose: Tramadol - 1 tablet every 6 hours as needed for pain # pills per month: 60 Medication and dose: Norco 7.5- 325- 1 tab by mouth twice daily as needed for intractable pain # pills per month:60 Last UDS date: 02/15/18 Opioid Treatment Agreement signed (Y/N): Y Opioid Treatment Agreement last reviewed with patient: Yes [place] reviewed this encounter (include red flags): Yes, no red flags  She is coming in for labs on Friday to update UDS/CSC.

## 2019-07-13 ENCOUNTER — Other Ambulatory Visit (INDEPENDENT_AMBULATORY_CARE_PROVIDER_SITE_OTHER): Payer: Medicare Other

## 2019-07-13 ENCOUNTER — Other Ambulatory Visit: Payer: Self-pay

## 2019-07-13 DIAGNOSIS — G8929 Other chronic pain: Secondary | ICD-10-CM

## 2019-07-13 LAB — COMPREHENSIVE METABOLIC PANEL
ALT: 10 U/L (ref 0–35)
AST: 15 U/L (ref 0–37)
Albumin: 4.2 g/dL (ref 3.5–5.2)
Alkaline Phosphatase: 57 U/L (ref 39–117)
BUN: 17 mg/dL (ref 6–23)
CO2: 27 mEq/L (ref 19–32)
Calcium: 9.5 mg/dL (ref 8.4–10.5)
Chloride: 104 mEq/L (ref 96–112)
Creatinine, Ser: 0.94 mg/dL (ref 0.40–1.20)
GFR: 57.82 mL/min — ABNORMAL LOW (ref >60.00)
Glucose, Bld: 128 mg/dL — ABNORMAL HIGH (ref 70–99)
Potassium: 3.7 mEq/L (ref 3.5–5.1)
Sodium: 139 mEq/L (ref 135–145)
Total Bilirubin: 0.4 mg/dL (ref 0.2–1.2)
Total Protein: 6.9 g/dL (ref 6.0–8.3)

## 2019-07-16 ENCOUNTER — Other Ambulatory Visit: Payer: Self-pay | Admitting: Family Medicine

## 2019-07-16 MED ORDER — HYDROCODONE-ACETAMINOPHEN 7.5-325 MG PO TABS: ORAL_TABLET | ORAL | 0 refills | Status: DC

## 2019-07-16 NOTE — Telephone Encounter (Signed)
Last OV 07/09/19 Last fill 06/18/19  #60/0

## 2019-07-16 NOTE — Telephone Encounter (Signed)
Medication Refill - Medication: HYDROcodone-acetaminophen (NORCO) 7.5-325 MG tablet    Has the patient contacted their pharmacy? Yes.   (Agent: If no, request that the patient contact the pharmacy for the refill.) (Agent: If yes, when and what did the pharmacy advise?)  Preferred Pharmacy (with phone number or street name):  [place] [place] [facility] [address]  [place] [address]  Phone: [phone] Fax: [phone]     Agent: Please be advised that RX refills may take up to 3 business days. We ask that you follow-up with your pharmacy.

## 2019-07-16 NOTE — Telephone Encounter (Signed)
Requested medication (s) are due for refill today:yes  Requested medication (s) are on the active medication list: yes  Last refill: 06/18/2019  Future visit scheduled: no  Notes to clinic:  refill cannot be delegated    Requested Prescriptions  Pending Prescriptions Disp Refills   HYDROcodone-acetaminophen ([place]) 7.5-325 MG tablet 60 tablet 0    Sig: TAKE 1 TABLET BY MOUTH TWICE DAILY AS NEEDED FOR MODERATE KNEE PAIN.      Not Delegated - Analgesics:  Opioid Agonist Combinations Failed - 07/16/2019 10:54 AM      Failed - This refill cannot be delegated      Failed - Urine Drug Screen completed in last 360 days.      Passed - Valid encounter within last 6 months    Recent Outpatient Visits           1 week ago DDD (degenerative disc disease), lumbosacral   [facility] [place], Marciano Sequin, MD   3 months ago Pediculosis   [facility] [place], [place], [place]   8 months ago RLQ abdominal pain   [facility] [place], [place], [place]   8 months ago Other chronic pain   [facility] Lucille Passy, MD   1 year ago Essential hypertension   [facility] [address] Lucille Passy, MD

## 2019-07-17 NOTE — Addendum Note (Signed)
Addended by: Darral Dash on: 07/17/2019 09:52 AM   Modules accepted: Orders

## 2019-07-17 NOTE — Telephone Encounter (Signed)
Pt called and informed us that medication was sent to wrong pharmacy.  Asking for it to be resent to [facility]. Please advise.

## 2019-07-17 NOTE — Telephone Encounter (Signed)
Pt called back.  States that medication was sent to the wrong pharmacy.  See first message pt needed this sent to [facility].  States that [facility] can't transfer for her and she will be in [place] at lunch time today.  Please call pt when this has been done so she knows she can pick up. Pt can be reached at [phone].

## 2019-07-17 NOTE — Telephone Encounter (Signed)
Pt just called back asking if the prescription was sent to [facility] long yet.

## 2019-07-18 ENCOUNTER — Other Ambulatory Visit: Payer: Self-pay | Admitting: Family Medicine

## 2019-07-18 LAB — PAIN MGMT, PROFILE 8 W/CONF, U
6 Acetylmorphine: NEGATIVE ng/mL
Alcohol Metabolites: NEGATIVE ng/mL (ref <500)
Amphetamines: NEGATIVE ng/mL
Benzodiazepines: NEGATIVE ng/mL
Buprenorphine, Urine: NEGATIVE ng/mL
Buprenorphine: NEGATIVE ng/mL
Cocaine Metabolite: NEGATIVE ng/mL
Codeine: NEGATIVE ng/mL
Creatinine: >300 mg/dL
Hydrocodone: 544 ng/mL
Hydromorphone: 521 ng/mL
MDMA: NEGATIVE ng/mL
Marijuana Metabolite: NEGATIVE ng/mL
Morphine: NEGATIVE ng/mL
Norbuprenorphine: NEGATIVE ng/mL
Norhydrocodone: 1037 ng/mL
Opiates: POSITIVE ng/mL
Oxidant: NEGATIVE ug/mL
Oxycodone: NEGATIVE ng/mL
pH: 5.6 (ref 4.5–9.0)

## 2019-07-18 MED ORDER — HYDROCODONE-ACETAMINOPHEN 7.5-325 MG PO TABS: ORAL_TABLET | ORAL | 0 refills | Status: DC

## 2019-07-18 MED FILL — HYDROCODON-APAP 7.5-325: 7.5-325 | 30 days supply | Qty: 60 | Fill #0

## 2019-07-18 NOTE — Telephone Encounter (Signed)
Med sent in already

## 2019-07-18 NOTE — Telephone Encounter (Signed)
Pt aware.

## 2019-07-18 NOTE — Telephone Encounter (Signed)
Patient is checking status on medication being sent to [facility] Call back [phone]

## 2019-07-23 ENCOUNTER — Other Ambulatory Visit: Payer: Self-pay

## 2019-07-23 DIAGNOSIS — E109 Type 1 diabetes mellitus without complications: Secondary | ICD-10-CM

## 2019-07-23 DIAGNOSIS — E1065 Type 1 diabetes mellitus with hyperglycemia: Secondary | ICD-10-CM

## 2019-07-24 ENCOUNTER — Other Ambulatory Visit (INDEPENDENT_AMBULATORY_CARE_PROVIDER_SITE_OTHER): Payer: Medicare Other

## 2019-07-24 ENCOUNTER — Other Ambulatory Visit: Payer: Self-pay | Admitting: Family Medicine

## 2019-07-24 DIAGNOSIS — E109 Type 1 diabetes mellitus without complications: Secondary | ICD-10-CM

## 2019-07-24 LAB — HEMOGLOBIN A1C: Hgb A1c MFr Bld: 5.8 % (ref 4.6–6.5)

## 2019-08-01 ENCOUNTER — Ambulatory Visit
Admission: RE | Admit: 2019-08-01 | Discharge: 2019-08-01 | Disposition: A | Discharge status: Home / Self Care | Payer: Medicare Other | Source: Ambulatory Visit | Attending: Family Medicine | Admitting: Family Medicine

## 2019-08-01 ENCOUNTER — Encounter: Payer: Self-pay | Admitting: Family Medicine

## 2019-08-01 DIAGNOSIS — M4807 Spinal stenosis, lumbosacral region: Secondary | ICD-10-CM

## 2019-08-13 DIAGNOSIS — M545 Low back pain: Secondary | ICD-10-CM | POA: Diagnosis not present

## 2019-08-13 DIAGNOSIS — M1610 Unilateral primary osteoarthritis, unspecified hip: Secondary | ICD-10-CM | POA: Diagnosis not present

## 2019-08-13 DIAGNOSIS — Z6835 Body mass index (BMI) 35.0-35.9, adult: Secondary | ICD-10-CM | POA: Diagnosis not present

## 2019-08-13 DIAGNOSIS — M5416 Radiculopathy, lumbar region: Secondary | ICD-10-CM | POA: Diagnosis not present

## 2019-08-13 DIAGNOSIS — M4126 Other idiopathic scoliosis, lumbar region: Secondary | ICD-10-CM | POA: Diagnosis not present

## 2019-08-13 DIAGNOSIS — M48061 Spinal stenosis, lumbar region without neurogenic claudication: Secondary | ICD-10-CM | POA: Diagnosis not present

## 2019-08-15 ENCOUNTER — Ambulatory Visit (INDEPENDENT_AMBULATORY_CARE_PROVIDER_SITE_OTHER): Payer: Medicare Other | Admitting: Family Medicine

## 2019-08-15 DIAGNOSIS — F1120 Opioid dependence, uncomplicated: Secondary | ICD-10-CM | POA: Diagnosis not present

## 2019-08-15 DIAGNOSIS — M5137 Other intervertebral disc degeneration, lumbosacral region: Secondary | ICD-10-CM

## 2019-08-15 DIAGNOSIS — I471 Supraventricular tachycardia: Secondary | ICD-10-CM

## 2019-08-15 DIAGNOSIS — M5416 Radiculopathy, lumbar region: Secondary | ICD-10-CM

## 2019-08-15 MED ORDER — HYDROCODONE-ACETAMINOPHEN 7.5-325 MG PO TABS: ORAL_TABLET | ORAL | 0 refills | Status: DC

## 2019-08-15 MED FILL — HYDROCODON-APAP 7.5-325: 7.5-325 | 30 days supply | Qty: 90 | Fill #0

## 2019-08-15 NOTE — Progress Notes (Signed)
TELEPHONE ENCOUNTER   Patient verbally agreed to telephone visit and is aware that copayment and coinsurance may apply. Patient was treated using telemedicine according to accepted telemedicine protocols.  Location of the patient: Patient's home   Location of provider: Provider's office Names of all persons participating in the telemedicine service and role in the encounter: Arnette Norris, MD [facility]  Subjective:   Chief Complaint  Patient presents with  . Follow-up     HPI   CLINICAL DATA:  Low back pain radiating into both legs. Increased urinary frequency. Patient reports chronic symptoms which are worsening over the last several weeks. No acute injury or prior relevant surgery.  EXAM: MRI LUMBAR SPINE WITHOUT CONTRAST  TECHNIQUE: Multiplanar, multisequence MR imaging of the lumbar spine was performed. No intravenous contrast was administered.  COMPARISON:  Lumbar MRI 01/29/2016. Abdominopelvic CT 04/15/2014  FINDINGS: Segmentation: Conventional anatomy assumed, with the last open disc space designated L5-S1.Concordant with previous imaging.  Alignment: Stable and near anatomic. There is a mild scoliosis convex to the left at L1-2 and to the right at L4.  Vertebrae: No worrisome osseous lesion, acute fracture or pars defect. Mildly progressive endplate degenerative changes.The visualized sacroiliac joints appear unremarkable.  Conus medullaris: Extends to the L1 level and appears normal.  Paraspinal and other soft tissues: No significant paraspinal findings.  Disc levels:  Sagittal images demonstrate stable disc degeneration with disc bulging and endplate osteophytes at T11-12. No resulting cord deformity. Chronic left-greater-than-right foraminal narrowing appears unchanged.  T12-L1: Minimal disc bulging. No spinal stenosis or nerve root encroachment.  L1-2: Stable disc bulging and endplate osteophytes asymmetric to the right. Mild facet  and ligamentous hypertrophy. No significant spinal stenosis or nerve root encroachment.  L2-3: Stable annular disc bulging, small right paracentral disc protrusion and endplate osteophytes. Mild facet and ligamentous hypertrophy. These factors contribute to mild spinal stenosis and mild narrowing of the foramina and right lateral recess. Overall appearance is stable.  L3-4: Stable loss of disc height with annular disc bulging and endplate osteophytes asymmetric to the left. Mild facet and ligamentous hypertrophy. No significant spinal stenosis or nerve root encroachment.  L4-5: Chronic loss of disc height with annular disc bulging and endplate osteophytes asymmetric to the left. Mild facet and ligamentous hypertrophy with possible postsurgical changes on the right, as before. Stable narrowing of the right lateral recess and both neural foramina, left greater than right.  L5-S1: Stable disc bulging, endplate osteophytes and facet hypertrophy. Stable mild foraminal narrowing bilaterally without nerve root encroachment.  IMPRESSION: 1. No acute findings or explanation for the patient's symptoms. 2. Multilevel spondylosis with disc bulging, endplate osteophytes and facet hypertrophy as described, similar to previous MRI from 2017. 3. Stable mild multifactorial spinal stenosis at L2-3 with mild narrowing of the foramina and right lateral recess. 4. Stable narrowing of the right lateral recess and both neural foramina at L4-5.   Electronically Signed   By: Richardean Sale M.D.   On: 08/01/2019 17:10  Patient Active Problem List   Diagnosis Date Noted  . Opioid type dependence, continuous ([place]) 08/15/2019  . Bilateral leg pain 07/09/2019  . Pediculosis 04/06/2019  . Grief 05/24/2018  . Right wrist tendonitis 08/24/2017  . Chronic pain 08/11/2017  . Hearing loss 11/23/2016  . SVT (supraventricular tachycardia) ([place])   . Angio-edema   . Carpal tunnel syndrome  09/19/2014  . Eczema 09/19/2014  . Chronic interstitial cystitis 05/20/2014  . Lumbar radiculopathy, chronic 12/31/2013  . DDD (degenerative disc disease),  lumbosacral 04/07/2011  . Insomnia 04/06/2010  . Vitamin D deficiency 10/03/2009  . HLD (hyperlipidemia) 07/16/2009  . Essential hypertension 07/16/2009   Social History   Tobacco Use  . Smoking status: Former Smoker    Types: Cigarettes    Quit date: 05/17/1971    Years since quitting: 48.2  . Smokeless tobacco: Never Used  Substance Use Topics  . Alcohol use: No    Alcohol/week: 0.0 standard drinks    Current Outpatient Medications:  .  amLODipine (NORVASC) 10 MG tablet, TAKE 1 TABLET BY MOUTH EVERY DAY, Disp: 90 tablet, Rfl: 1 .  atenolol (TENORMIN) 25 MG tablet, TAKE 1 TABLET BY MOUTH EVERY DAY, Disp: 90 tablet, Rfl: 1 .  Fluocinonide Emulsified Base 0.05 % CREA, APPLY 1 APPLICATION TOPICALLY 2 (TWO) TIMES DAILY., Disp: 30 g, Rfl: 0 .  hydrochlorothiazide (HYDRODIURIL) 25 MG tablet, TAKE 1 TABLET BY MOUTH EVERY DAY, Disp: 90 tablet, Rfl: 1 .  HYDROcodone-acetaminophen (NORCO) 7.5-325 MG tablet, TAKE 1 TABLET BY MOUTH Three times DAILY AS NEEDED FOR MODERATE KNEE PAIN., Disp: 90 tablet, Rfl: 0 .  magnesium hydroxide (MILK OF MAGNESIA) 400 MG/5ML suspension, Take by mouth daily as needed for mild constipation., Disp: , Rfl:  .  meclizine (ANTIVERT) 12.5 MG tablet, Take 1 tablet (12.5 mg total) by mouth 3 (three) times daily as needed for dizziness., Disp: 30 tablet, Rfl: 0 .  Meth-Hyo-M Bl-Na Phos-Ph Sal (URO-MP) 118 MG CAPS, Take 1 capsule by mouth 3 (three) times daily as needed., Disp: , Rfl: 3 .  polyethylene glycol powder (GLYCOLAX/MIRALAX) powder, Dissolve 17 grams in at least 8 ounces water/juice and drink once daily, Disp: 527 g, Rfl: 3 .  traMADol (ULTRAM) 50 MG tablet, TAKE 1 TABLET (50 MG TOTAL) BY MOUTH EVERY 6 (SIX) HOURS AS NEEDED. FOR PAIN, Disp: 60 tablet, Rfl: 0 Allergies  Allergen Reactions  . Contrast Media  [Iodinated Diagnostic Agents]     ? angioedema  . Lisinopril     ? angioedema  . Allegra [Fexofenadine] Other (See Comments)    Makes pt nervous  . Codeine Nausea And Vomiting  . Nsaids     "stomach on fire feeling"   . Prednisone     Causes elevation in blood pressure- ? All steroids cause same reaction  . Zithromax [Azithromycin] Other (See Comments)    Burns stomach  . Sulfa Antibiotics Rash     Arnette Norris, MD 08/15/2019  Time spent with the patient: 22 minutes, spent in obtaining information about her symptoms, reviewing her previous labs, evaluations, and treatments, counseling her about her condition (please see the discussed topics above), and developing a plan to further investigate it; she had a number of questions which I addressed.   [id number] physician/qualified health professional telephone evaluation 5 to 10 minutes [id number] physician/qualified help functional [place] evaluation for 11 to 20 minutes [id number] physician/qualify he will professional telephone evaluation for 21 to 30 minutes    Lab Results  Component Value Date   WBC 9.4 05/24/2018   HGB 14.6 05/24/2018   HCT 42.1 05/24/2018   PLT 244.0 05/24/2018   GLUCOSE 128 (H) 07/13/2019   CHOL 218 (H) 05/24/2018   TRIG 231.0 (H) 05/24/2018   HDL 39.60 05/24/2018   LDLDIRECT 158.0 05/24/2018   LDLCALC 134 (H) 12/29/2015   ALT 10 07/13/2019   AST 15 07/13/2019   NA 139 07/13/2019   K 3.7 07/13/2019   CL 104 07/13/2019   CREATININE 0.94 07/13/2019   BUN 17 07/13/2019  CO2 27 07/13/2019   TSH 1.41 05/24/2018   HGBA1C 5.8 07/24/2019    Lab Results  Component Value Date   TSH 1.41 05/24/2018   Lab Results  Component Value Date   WBC 9.4 05/24/2018   HGB 14.6 05/24/2018   HCT 42.1 05/24/2018   MCV 85.1 05/24/2018   PLT 244.0 05/24/2018   Lab Results  Component Value Date   NA 139 07/13/2019   K 3.7 07/13/2019   CO2 27 07/13/2019   GLUCOSE 128 (H) 07/13/2019   BUN 17 07/13/2019   CREATININE 0.94  07/13/2019   BILITOT 0.4 07/13/2019   ALKPHOS 57 07/13/2019   AST 15 07/13/2019   ALT 10 07/13/2019   PROT 6.9 07/13/2019   ALBUMIN 4.2 07/13/2019   CALCIUM 9.5 07/13/2019   ANIONGAP 8 08/20/2016   GFR 57.82 (L) 07/13/2019   Lab Results  Component Value Date   CHOL 218 (H) 05/24/2018   Lab Results  Component Value Date   HDL 39.60 05/24/2018   Lab Results  Component Value Date   LDLCALC 134 (H) 12/29/2015   Lab Results  Component Value Date   TRIG 231.0 (H) 05/24/2018   Lab Results  Component Value Date   CHOLHDL 6 05/24/2018   Lab Results  Component Value Date   HGBA1C 5.8 07/24/2019       Assessment & Plan:   Problem List Items Addressed This Visit      Active Problems   DDD (degenerative disc disease), lumbosacral    Saw Dr. Vertell Limber last week, who recommended cortisone injections and referring her to physical therapy for 6 weeks prior to considering surgery.  She is taking her norco as needed.  Wanted to update me since she had an office visit with him.  CLINICAL DATA:  Low back pain radiating into both legs. Increased urinary frequency. Patient reports chronic symptoms which are worsening over the last several weeks. No acute injury or prior relevant surgery.  EXAM: MRI LUMBAR SPINE WITHOUT CONTRAST  TECHNIQUE: Multiplanar, multisequence MR imaging of the lumbar spine was performed. No intravenous contrast was administered.  COMPARISON:  Lumbar MRI 01/29/2016. Abdominopelvic CT 04/15/2014  FINDINGS: Segmentation: Conventional anatomy assumed, with the last open disc space designated L5-S1.Concordant with previous imaging.  Alignment: Stable and near anatomic. There is a mild scoliosis convex to the left at L1-2 and to the right at L4.  Vertebrae: No worrisome osseous lesion, acute fracture or pars defect. Mildly progressive endplate degenerative changes.The visualized sacroiliac joints appear unremarkable.  Conus medullaris: Extends to  the L1 level and appears normal.  Paraspinal and other soft tissues: No significant paraspinal findings.  Disc levels:  Sagittal images demonstrate stable disc degeneration with disc bulging and endplate osteophytes at T11-12. No resulting cord deformity. Chronic left-greater-than-right foraminal narrowing appears unchanged.  T12-L1: Minimal disc bulging. No spinal stenosis or nerve root encroachment.  L1-2: Stable disc bulging and endplate osteophytes asymmetric to the right. Mild facet and ligamentous hypertrophy. No significant spinal stenosis or nerve root encroachment.  L2-3: Stable annular disc bulging, small right paracentral disc protrusion and endplate osteophytes. Mild facet and ligamentous hypertrophy. These factors contribute to mild spinal stenosis and mild narrowing of the foramina and right lateral recess. Overall appearance is stable.  L3-4: Stable loss of disc height with annular disc bulging and endplate osteophytes asymmetric to the left. Mild facet and ligamentous hypertrophy. No significant spinal stenosis or nerve root encroachment.  L4-5: Chronic loss of disc height with annular disc bulging  and endplate osteophytes asymmetric to the left. Mild facet and ligamentous hypertrophy with possible postsurgical changes on the right, as before. Stable narrowing of the right lateral recess and both neural foramina, left greater than right.  L5-S1: Stable disc bulging, endplate osteophytes and facet hypertrophy. Stable mild foraminal narrowing bilaterally without nerve root encroachment.  IMPRESSION: 1. No acute findings or explanation for the patient's symptoms. 2. Multilevel spondylosis with disc bulging, endplate osteophytes and facet hypertrophy as described, similar to previous MRI from 2017. 3. Stable mild multifactorial spinal stenosis at L2-3 with mild narrowing of the foramina and right lateral recess. 4. Stable narrowing of the right  lateral recess and both neural foramina at L4-5.   Electronically Signed   By: Richardean Sale M.D.   On: 08/01/2019 17:10      Relevant Medications   HYDROcodone-acetaminophen (NORCO) 7.5-325 MG tablet   Lumbar radiculopathy, chronic - Primary   SVT (supraventricular tachycardia) ([facility])   Opioid type dependence, continuous ([place])      I have changed Saga H. Geraci's HYDROcodone-acetaminophen. I am also having her maintain her magnesium hydroxide, polyethylene glycol powder, Uro-MP, Fluocinonide Emulsified Base, meclizine, amLODipine, atenolol, hydrochlorothiazide, and traMADol.  Meds ordered this encounter  Medications  . HYDROcodone-acetaminophen (NORCO) 7.5-325 MG tablet    Sig: TAKE 1 TABLET BY MOUTH Three times DAILY AS NEEDED FOR MODERATE KNEE PAIN.    Dispense:  90 tablet    Refill:  0    August     Arnette Norris, MD

## 2019-08-15 NOTE — Assessment & Plan Note (Addendum)
Saw Dr. Vertell Limber last week, who recommended cortisone injections and referring her to physical therapy for 6 weeks prior to considering surgery.  She is taking her norco as needed.  Wanted to update me since she had an office visit with him.  CLINICAL DATA:  Low back pain radiating into both legs. Increased urinary frequency. Patient reports chronic symptoms which are worsening over the last several weeks. No acute injury or prior relevant surgery.  EXAM: MRI LUMBAR SPINE WITHOUT CONTRAST  TECHNIQUE: Multiplanar, multisequence MR imaging of the lumbar spine was performed. No intravenous contrast was administered.  COMPARISON:  Lumbar MRI 01/29/2016. Abdominopelvic CT 04/15/2014  FINDINGS: Segmentation: Conventional anatomy assumed, with the last open disc space designated L5-S1.Concordant with previous imaging.  Alignment: Stable and near anatomic. There is a mild scoliosis convex to the left at L1-2 and to the right at L4.  Vertebrae: No worrisome osseous lesion, acute fracture or pars defect. Mildly progressive endplate degenerative changes.The visualized sacroiliac joints appear unremarkable.  Conus medullaris: Extends to the L1 level and appears normal.  Paraspinal and other soft tissues: No significant paraspinal findings.  Disc levels:  Sagittal images demonstrate stable disc degeneration with disc bulging and endplate osteophytes at T11-12. No resulting cord deformity. Chronic left-greater-than-right foraminal narrowing appears unchanged.  T12-L1: Minimal disc bulging. No spinal stenosis or nerve root encroachment.  L1-2: Stable disc bulging and endplate osteophytes asymmetric to the right. Mild facet and ligamentous hypertrophy. No significant spinal stenosis or nerve root encroachment.  L2-3: Stable annular disc bulging, small right paracentral disc protrusion and endplate osteophytes. Mild facet and ligamentous hypertrophy. These factors contribute  to mild spinal stenosis and mild narrowing of the foramina and right lateral recess. Overall appearance is stable.  L3-4: Stable loss of disc height with annular disc bulging and endplate osteophytes asymmetric to the left. Mild facet and ligamentous hypertrophy. No significant spinal stenosis or nerve root encroachment.  L4-5: Chronic loss of disc height with annular disc bulging and endplate osteophytes asymmetric to the left. Mild facet and ligamentous hypertrophy with possible postsurgical changes on the right, as before. Stable narrowing of the right lateral recess and both neural foramina, left greater than right.  L5-S1: Stable disc bulging, endplate osteophytes and facet hypertrophy. Stable mild foraminal narrowing bilaterally without nerve root encroachment.  IMPRESSION: 1. No acute findings or explanation for the patient's symptoms. 2. Multilevel spondylosis with disc bulging, endplate osteophytes and facet hypertrophy as described, similar to previous MRI from 2017. 3. Stable mild multifactorial spinal stenosis at L2-3 with mild narrowing of the foramina and right lateral recess. 4. Stable narrowing of the right lateral recess and both neural foramina at L4-5.   Electronically Signed   By: Richardean Sale M.D.   On: 08/01/2019 17:10

## 2019-08-27 ENCOUNTER — Other Ambulatory Visit: Payer: Self-pay | Admitting: Family Medicine

## 2019-08-27 NOTE — Telephone Encounter (Signed)
Last OV 08/15/19 Last fill 07/24/19  #60/0

## 2019-09-05 DIAGNOSIS — M48061 Spinal stenosis, lumbar region without neurogenic claudication: Secondary | ICD-10-CM | POA: Diagnosis not present

## 2019-09-06 DIAGNOSIS — M48061 Spinal stenosis, lumbar region without neurogenic claudication: Secondary | ICD-10-CM | POA: Diagnosis not present

## 2019-09-08 ENCOUNTER — Other Ambulatory Visit: Payer: Self-pay | Admitting: Family Medicine

## 2019-09-11 NOTE — Telephone Encounter (Signed)
Ok to refill 

## 2019-09-11 NOTE — Telephone Encounter (Signed)
Last OV 08/15/19 Last fill 03/19/19  #90/1 Next OV 09/13/19

## 2019-09-12 ENCOUNTER — Telehealth: Payer: Self-pay | Admitting: General Practice

## 2019-09-12 DIAGNOSIS — G8929 Other chronic pain: Secondary | ICD-10-CM

## 2019-09-12 DIAGNOSIS — M5416 Radiculopathy, lumbar region: Secondary | ICD-10-CM

## 2019-09-12 DIAGNOSIS — M5137 Other intervertebral disc degeneration, lumbosacral region: Secondary | ICD-10-CM

## 2019-09-12 MED ORDER — HYDROCODONE-ACETAMINOPHEN 7.5-325 MG PO TABS: ORAL_TABLET | ORAL | 0 refills | Status: DC

## 2019-09-12 MED FILL — HYDROCODON-APAP 7.5-325: 7.5-325 | 30 days supply | Qty: 90 | Fill #0

## 2019-09-12 NOTE — Telephone Encounter (Signed)
Need to establish with new pcp in order to get additional refills. I will not be refilling tramadol

## 2019-09-12 NOTE — Telephone Encounter (Signed)
Patient is calling and requested a refill for hydrocodone sent to [facility]. CB is [phone]

## 2019-09-13 ENCOUNTER — Telehealth (INDEPENDENT_AMBULATORY_CARE_PROVIDER_SITE_OTHER): Payer: Medicare Other | Admitting: Nurse Practitioner

## 2019-09-13 ENCOUNTER — Encounter: Payer: Self-pay | Admitting: Nurse Practitioner

## 2019-09-13 DIAGNOSIS — M51379 Other intervertebral disc degeneration, lumbosacral region without mention of lumbar back pain or lower extremity pain: Secondary | ICD-10-CM

## 2019-09-13 DIAGNOSIS — L819 Disorder of pigmentation, unspecified: Secondary | ICD-10-CM | POA: Diagnosis not present

## 2019-09-13 DIAGNOSIS — Z1283 Encounter for screening for malignant neoplasm of skin: Secondary | ICD-10-CM | POA: Diagnosis not present

## 2019-09-13 DIAGNOSIS — G8929 Other chronic pain: Secondary | ICD-10-CM

## 2019-09-13 DIAGNOSIS — M5416 Radiculopathy, lumbar region: Secondary | ICD-10-CM | POA: Diagnosis not present

## 2019-09-13 DIAGNOSIS — M5137 Other intervertebral disc degeneration, lumbosacral region: Secondary | ICD-10-CM | POA: Diagnosis not present

## 2019-09-13 NOTE — Assessment & Plan Note (Addendum)
hx of chronic back pain with radiculopathy due to severe DDD and spinal stenosis. With the use of an opiate, she is unable to perform ADLs (bathing, cleaning, grocery shopping, or adequate sleep), unable to tolerate NSAIDs due to GI side effects. She is also under the care of spine specialist who recommended injections and PT. She reports she has difficulty with PT due to pain. She has attended only 1session. Current use of tramadol and hydrocodone, prescribed by previous pcp: Dr. Deborra Medina PMP database reviewed, no red flags, UDS up to date 07/12/2020, no updated controlled substance contract documented. She denies any fall/constipation/ daytime somnolence Hydrocodone rx sent 09/12/2019, tramadol last filled 08/27/2019 Use of hydrocodone 1tab TID, pain is worse at bedtime. Use of tramadol 2tab a day. Discussed pain management goal: maintain independent ADLs with minimal opiate use and without adverse side effects. She states her goal is to continue ADLs with minimal pain, and she is able to obtain that with hydrocodone TID. Informed her about the need for pain clinic referral if she wishes to maintain tramadol and hydrocodone use.I informed her of the risk of overdose when combining 2opiates. She decided use only hydrocodone at this time. She declined referral to pain clinic at this time.Tramadol discontinue today. She verbalized understanding. Schedule F2F appt on 10/02/2019 (need to sign controlled substance contract and collect UDS). Consider use of cymbalta vs lyrica vs gabapentin?

## 2019-09-13 NOTE — Progress Notes (Signed)
Virtual Visit via Video Note  I connected with@ on 09/13/19 at  9:00 AM EST by a video enabled telemedicine application and verified that I am speaking with the correct person using two identifiers.  Location: Patient:Home Provider: Office Participants: patient and provider   I connected with "Patient Name" today by a video enabled telemedicine application and verified that I am speaking with the correct person using two identifiers. Location patient: home Location provider: work Persons participating in the virtual visit: patient, provider  I discussed the limitations of evaluation and management by telemedicine and the availability of in person appointments. I also discussed with the patient that there may be a patient responsible charge related to this service. The patient expressed understanding and agreed to proceed.  [id number] on lip and chronic pain management.  History of Present Illness: Ms.Ng wants to discuss chronic pain management and lesion on her lips. Lesion on her left upper lip has been present for several months, but she noticed a change in size and non healing ulcer at the center. She denies an Fhx of melanoma. No tobacco use, no ETOH use. She also has a hx of chronic back pain with radiculopathy due to severe DDD and spinal stenosis. With the use of an opiate, she is unable to perform ADLs (bathing, cleaning, grocery shopping, or adequate sleep), unable to tolerate NSAIDs due to GI side effects. She is also under the care of spine specialist who recommended injections and PT. She reports she has difficulty with PT due to pain. She has attended only 1session. Current use of tramadol and hydrocodone, prescribed by previous pcp: Dr. Deborra Medina PMP database reviewed, no red flags, UDS up to date 07/12/2020, no updated controlled substance contract documented. She denies any fall/constipation/ daytime somnolence Hydrocodone rx sent 09/12/2019, tramadol last filled 08/27/2019     Observations/Objective: Physical Exam  Constitutional: She is oriented to person, place, and time.  HENT:  Mouth/Throat:    Pulmonary/Chest: Effort normal.  Neurological: She is alert and oriented to person, place, and time.  Skin: Rash noted. Rash is papular.  Psychiatric: She has a normal mood and affect. Her behavior is normal. Thought content normal.   Assessment and Plan: Diagnoses and all orders for this visit:  Atypical pigmented lesion -     Ambulatory referral to Dermatology  Skin cancer screening -     Ambulatory referral to Dermatology  DDD (degenerative disc disease), lumbosacral  Lumbar radiculopathy, chronic  Other chronic pain   Follow Up Instructions: See avs   I discussed the assessment and treatment plan with the patient. The patient was provided an opportunity to ask questions and all were answered. The patient agreed with the plan and demonstrated an understanding of the instructions.   The patient was advised to call back or seek an in-person evaluation if the symptoms worsen or if the condition fails to improve as anticipated.   Wilfred Lacy, NP

## 2019-09-14 NOTE — Telephone Encounter (Signed)
Pt aware.

## 2019-09-17 DIAGNOSIS — M1610 Unilateral primary osteoarthritis, unspecified hip: Secondary | ICD-10-CM | POA: Diagnosis not present

## 2019-09-18 DIAGNOSIS — M48061 Spinal stenosis, lumbar region without neurogenic claudication: Secondary | ICD-10-CM | POA: Diagnosis not present

## 2019-09-20 DIAGNOSIS — M48061 Spinal stenosis, lumbar region without neurogenic claudication: Secondary | ICD-10-CM | POA: Diagnosis not present

## 2019-09-25 NOTE — Progress Notes (Signed)
Nurse connected with patient 09/26/19 at  2:30 PM EST by a telephone enabled telemedicine application and verified that I am speaking with the correct person using two identifiers. Patient stated full name and DOB. Patient gave permission to continue with virtual visit. Patient's location was at home and Nurse's location was at [place] office.   Subjective:   Heather Shaw is a 79 y.o. female who presents for Medicare Annual (Subsequent) preventive examination.  Review of Systems:  Home Safety/Smoke Alarms: Feels safe in home. Smoke alarms in place.  Lives alone in 1 story home. Step over tub. Reports one of her children check on her daily. Denies any medical equipment needs.   Female:       Mammo- pt reports last done w/ Dr.Neal  05/27/2019 Dexa scan- pt reports last done w/ Dr.Neal  05/27/2019       CCS- 11/02/10    Objective:     Vitals: Unable to assess. This visit is enabled though telemedicine due to Covid 19.   Advanced Directives 09/26/2019 11/16/2017 11/08/2016 08/23/2016 08/19/2016 12/27/2015 12/27/2015  Does Patient Have a Medical Advance Directive? No No No No No No No  Type of Advance Directive - - - - - - -  Does patient want to make changes to medical advance directive? - - - - - - -  Copy of [place] in Chart? - - - - - - -  Would patient like information on creating a medical advance directive? No - Patient declined No - Patient declined - No - Patient declined - No - patient declined information No - patient declined information    Tobacco Social History   Tobacco Use  Smoking Status Former Smoker  . Types: Cigarettes  . Quit date: 05/17/1971  . Years since quitting: 48.3  Smokeless Tobacco Never Used     Counseling given: Not Answered   Clinical Intake:     Pain : 0-10 Pain Score: 4  Pain Type: Chronic pain Pain Location: Hip Pain Orientation: Left Pain Onset: More than a month ago Pain Frequency: Constant Pain Relieving  Factors: hydrocodone  Pain Relieving Factors: hydrocodone              Past Medical History:  Diagnosis Date  . Arthritis   . Bladder pain   . Bradycardia   . DDD (degenerative disc disease), lumbosacral   . Hyperlipidemia   . Hyperplastic colon polyp   . Hypertension   . IC (interstitial cystitis)   . Internal hemorrhoids   . Polyp of rectum   . PONV (postoperative nausea and vomiting)   . Squamous papilloma    in esophageal polyp  . Uterine fibroid   . Vertigo   . Wears glasses   . Wears partial dentures    Past Surgical History:  Procedure Laterality Date  . COLONOSCOPY W/ POLYPECTOMY  2009  . CYSTO WITH HYDRODISTENSION N/A 05/20/2014   Procedure: CYSTOSCOPY/HYDRODISTENSION, INSTILLATION OF CHLORPACTIN;  Surgeon: Bernestine Amass, MD;  Location: [facility];  Service: Urology;  Laterality: N/A;  . CYSTO/  HYDRODISTENTION/  INSTILLATION CLORPACTIC  09-21-2010  . DILATION AND CURETTAGE OF UTERUS  1988  . KNEE ARTHROSCOPY Right 11-18-2009  . TRIGGER FINGER RELEASE Right 08/23/2016   Procedure: RIGHT RING FINGER TRIGGER RELEASE;  Surgeon: Milly Jakob, MD;  Location: [facility];  Service: Orthopedics;  Laterality: Right;   Family History  Problem Relation Age of Onset  . Clotting disorder Mother  66  . COPD Father   . Heart attack Father 1  . Emphysema Father   . Stroke Paternal Aunt   . Hypertension Sister   . Hypertension Brother    Social History   Socioeconomic History  . Marital status: Married    Spouse name: Not on file  . Number of children: 6  . Years of education: Not on file  . Highest education level: Not on file  Occupational History  . Occupation: housewife  Tobacco Use  . Smoking status: Former Smoker    Types: Cigarettes    Quit date: 05/17/1971    Years since quitting: 48.3  . Smokeless tobacco: Never Used  Substance and Sexual Activity  . Alcohol use: No    Alcohol/week: 0.0 standard drinks  . Drug  use: No  . Sexual activity: Never  Other Topics Concern  . Not on file  Social History Narrative  . Not on file   Social Determinants of Health   Financial Resource Strain:   . Difficulty of Paying Living Expenses: Not on file  Food Insecurity:   . Worried About Charity fundraiser in the Last Year: Not on file  . Ran Out of Food in the Last Year: Not on file  Transportation Needs:   . Lack of Transportation (Medical): Not on file  . Lack of Transportation (Non-Medical): Not on file  Physical Activity:   . Days of Exercise per Week: Not on file  . Minutes of Exercise per Session: Not on file  Stress:   . Feeling of Stress : Not on file  Social Connections:   . Frequency of Communication with Friends and Family: Not on file  . Frequency of Social Gatherings with Friends and Family: Not on file  . Attends Religious Services: Not on file  . Active Member of Clubs or Organizations: Not on file  . Attends Archivist Meetings: Not on file  . Marital Status: Not on file    Outpatient Encounter Medications as of 09/26/2019  Medication Sig  . amLODipine (NORVASC) 10 MG tablet Take 1 tablet (10 mg total) by mouth daily.  Marland Kitchen atenolol (TENORMIN) 25 MG tablet TAKE 1 TABLET BY MOUTH EVERY DAY  . Fluocinonide Emulsified Base 0.05 % CREA APPLY 1 APPLICATION TOPICALLY 2 (TWO) TIMES DAILY.  . hydrochlorothiazide (HYDRODIURIL) 25 MG tablet TAKE 1 TABLET BY MOUTH EVERY DAY  . HYDROcodone-acetaminophen (NORCO) 7.5-325 MG tablet TAKE 1 TABLET BY MOUTH Three times DAILY AS NEEDED. Need office visit for additional refills  . magnesium hydroxide (MILK OF MAGNESIA) 400 MG/5ML suspension Take by mouth daily as needed for mild constipation.  . meclizine (ANTIVERT) 12.5 MG tablet Take 1 tablet (12.5 mg total) by mouth 3 (three) times daily as needed for dizziness.  . Meth-Hyo-M Bl-Na Phos-Ph Sal (URO-MP) 118 MG CAPS Take 1 capsule by mouth 3 (three) times daily as needed.  . polyethylene glycol  powder (GLYCOLAX/MIRALAX) powder Dissolve 17 grams in at least 8 ounces water/juice and drink once daily   No facility-administered encounter medications on file as of 09/26/2019.    Activities of Daily Living In your present state of health, do you have any difficulty performing the following activities: 09/26/2019  Hearing? N  Vision? N  Difficulty concentrating or making decisions? N  Walking or climbing stairs? N  Dressing or bathing? N  Doing errands, shopping? N  Preparing Food and eating ? N  Using the Toilet? N  In the past six months, have  you accidently leaked urine? N  Do you have problems with loss of bowel control? N  Managing your Medications? N  Managing your Finances? N  Housekeeping or managing your Housekeeping? N  Some recent data might be hidden    Patient Care Team: [name], Charlene Brooke, NP as PCP - General (Internal Medicine) Rana Snare, MD as Consulting Physician (Urology) Maisie Fus, MD as Consulting Physician (Obstetrics and Gynecology) Juluis Rainier as Consulting Physician (Optometry)    Assessment:   This is a routine wellness examination for [facility]. Physical assessment deferred to PCP.  Exercise Activities and Dietary recommendations Current Exercise Habits: Structured exercise class, Type of exercise: stretching(PT), Time (Minutes): 15, Frequency (Times/Week): 7, Weekly Exercise (Minutes/Week): 105, Exercise limited by: None identified Diet (meal preparation, eat out, water intake, caffeinated beverages, dairy products, fruits and vegetables): well balanced  Goals    . Increase physical activity     Starting 11/08/2016, I will continue to do stretching for 30 min 3 days per week.     . Weight (lb) < 200 lb (90.7 kg)     By using weight watchers and beginning to exercise.       Fall Risk Fall Risk  09/26/2019 06/20/2019 11/16/2017 11/08/2016 11/03/2015  Falls in the past year? 0 0 No No No  Comment - Emmi Telephone Survey: data to providers prior  to load - - -  Number falls in past yr: 0 - - - -  Injury with Fall? 0 - - - -  Follow up Education provided;Falls prevention discussed - - - -   Depression Screen PHQ 2/9 Scores 09/26/2019 07/09/2019 11/16/2017 11/08/2016  PHQ - 2 Score 0 0 0 0     Cognitive Function  Ad8 score reviewed for issues:  Issues making decisions:no  Less interest in hobbies / activities:no  Repeats questions, stories (family complaining):no  Trouble using ordinary gadgets (microwave, computer, phone):no  Forgets the month or year: no  Mismanaging finances: no  Remembering appts:no  Daily problems with thinking and/or memory:no Ad8 score is=0     MMSE - Mini Mental State Exam 11/16/2017 11/08/2016 11/03/2015  Orientation to time 5 5 5   Orientation to Place 5 5 5   Registration 3 3 3   Attention/ Calculation 5 0 0  Recall 3 3 3   Language- name 2 objects 2 0 0  Language- repeat 1 1 1   Language- follow 3 step command 3 3 3   Language- read & follow direction 1 0 0  Write a sentence 1 0 0  Copy design 1 0 0  Total score 30 20 20         Immunization History  Administered Date(s) Administered  . Influenza Split 05/29/2012  . Influenza, High Dose Seasonal PF 05/24/2018  . Influenza,inj,Quad PF,6+ Mos 06/23/2016, 05/27/2017  . Pneumococcal Conjugate-13 11/03/2015  . Pneumococcal Polysaccharide-23 07/14/2006, 05/29/2012  . Td 07/11/2008  . Tdap 04/23/2014  . Zoster 10/27/2010   Screening Tests Health Maintenance  Topic Date Due  . URINE MICROALBUMIN  01/25/1951  . TETANUS/TDAP  04/23/2024  . INFLUENZA VACCINE  Completed  . DEXA SCAN  Completed  . PNA vac Low Risk Adult  Completed     Plan:    Please schedule your next medicare wellness visit with me in 1 yr.  Continue to eat heart healthy diet (full of fruits, vegetables, whole grains, lean protein, water--limit salt, fat, and sugar intake) and increase physical activity as tolerated.  Continue doing brain stimulating activities  (puzzles,  reading, adult coloring books, staying active) to keep memory sharp.   Bring a copy of your living will and/or healthcare power of attorney to your next office visit.    I have personally reviewed and noted the following in the patient's chart:   . Medical and social history . Use of alcohol, tobacco or illicit drugs  . Current medications and supplements . Functional ability and status . Nutritional status . Physical activity . Advanced directives . List of other physicians . Hospitalizations, surgeries, and ER visits in previous 12 months . Vitals . Screenings to include cognitive, depression, and falls . Referrals and appointments  In addition, I have reviewed and discussed with patient certain preventive protocols, quality metrics, and best practice recommendations. A written personalized care plan for preventive services as well as general preventive health recommendations were provided to patient.     Shela Nevin, [place]  09/26/2019

## 2019-09-26 ENCOUNTER — Ambulatory Visit (INDEPENDENT_AMBULATORY_CARE_PROVIDER_SITE_OTHER): Payer: Medicare Other | Admitting: *Deleted

## 2019-09-26 ENCOUNTER — Encounter: Payer: Self-pay | Admitting: *Deleted

## 2019-09-26 DIAGNOSIS — Z0000 Encounter for general adult medical examination without abnormal findings: Secondary | ICD-10-CM

## 2019-09-26 NOTE — Patient Instructions (Signed)
Please schedule your next medicare wellness visit with me in 1 yr.  Continue to eat heart healthy diet (full of fruits, vegetables, whole grains, lean protein, water--limit salt, fat, and sugar intake) and increase physical activity as tolerated.  Continue doing brain stimulating activities (puzzles, reading, adult coloring books, staying active) to keep memory sharp.   Bring a copy of your living will and/or healthcare power of attorney to your next office visit.    Heather Shaw , Thank you for taking time to come for your Medicare Wellness Visit. I appreciate your ongoing commitment to your health goals. Please review the following plan we discussed and let me know if I can assist you in the future.   These are the goals we discussed: Goals    . Increase physical activity     Starting 11/08/2016, I will continue to do stretching for 30 min 3 days per week.     . Weight (lb) < 200 lb (90.7 kg)     By using weight watchers and beginning to exercise.       This is a list of the screening recommended for you and due dates:  Health Maintenance  Topic Date Due  . Urine Protein Check  01/25/1951  . Tetanus Vaccine  04/23/2024  . Flu Shot  Completed  . DEXA scan (bone density measurement)  Completed  . Pneumonia vaccines  Completed    Preventive Care 77 Years and Older, Female Preventive care refers to lifestyle choices and visits with your health care provider that can promote health and wellness. This includes:  A yearly physical exam. This is also called an annual well check.  Regular dental and eye exams.  Immunizations.  Screening for certain conditions.  Healthy lifestyle choices, such as diet and exercise. What can I expect for my preventive care visit? Physical exam Your health care provider will check:  Height and weight. These may be used to calculate body mass index (BMI), which is a measurement that tells if you are at a healthy weight.  Heart rate and blood  pressure.  Your skin for abnormal spots. Counseling Your health care provider may ask you questions about:  Alcohol, tobacco, and drug use.  Emotional well-being.  Home and relationship well-being.  Sexual activity.  Eating habits.  History of falls.  Memory and ability to understand (cognition).  Work and work Statistician.  Pregnancy and menstrual history. What immunizations do I need?  Influenza (flu) vaccine  This is recommended every year. Tetanus, diphtheria, and pertussis (Tdap) vaccine  You may need a Td booster every 10 years. Varicella (chickenpox) vaccine  You may need this vaccine if you have not already been vaccinated. Zoster (shingles) vaccine  You may need this after age 51. Pneumococcal conjugate (PCV13) vaccine  One dose is recommended after age 18. Pneumococcal polysaccharide (PPSV23) vaccine  One dose is recommended after age 72. Measles, mumps, and rubella (MMR) vaccine  You may need at least one dose of MMR if you were born in 1957 or later. You may also need a second dose. Meningococcal conjugate (MenACWY) vaccine  You may need this if you have certain conditions. Hepatitis A vaccine  You may need this if you have certain conditions or if you travel or work in places where you may be exposed to hepatitis A. Hepatitis B vaccine  You may need this if you have certain conditions or if you travel or work in places where you may be exposed to hepatitis B. Haemophilus  influenzae type b (Hib) vaccine  You may need this if you have certain conditions. You may receive vaccines as individual doses or as more than one vaccine together in one shot (combination vaccines). Talk with your health care provider about the risks and benefits of combination vaccines. What tests do I need? Blood tests  Lipid and cholesterol levels. These may be checked every 5 years, or more frequently depending on your overall health.  Hepatitis C test.  Hepatitis B  test. Screening  Lung cancer screening. You may have this screening every year starting at age 24 if you have a 30-pack-year history of smoking and currently smoke or have quit within the past 15 years.  Colorectal cancer screening. All adults should have this screening starting at age 68 and continuing until age 19. Your health care provider may recommend screening at age 109 if you are at increased risk. You will have tests every 1-10 years, depending on your results and the type of screening test.  Diabetes screening. This is done by checking your blood sugar (glucose) after you have not eaten for a while (fasting). You may have this done every 1-3 years.  Mammogram. This may be done every 1-2 years. Talk with your health care provider about how often you should have regular mammograms.  BRCA-related cancer screening. This may be done if you have a family history of breast, ovarian, tubal, or peritoneal cancers. Other tests  Sexually transmitted disease (STD) testing.  Bone density scan. This is done to screen for osteoporosis. You may have this done starting at age 38. Follow these instructions at home: Eating and drinking  Eat a diet that includes fresh fruits and vegetables, whole grains, lean protein, and low-fat dairy products. Limit your intake of foods with high amounts of sugar, saturated fats, and salt.  Take vitamin and mineral supplements as recommended by your health care provider.  Do not drink alcohol if your health care provider tells you not to drink.  If you drink alcohol: ? Limit how much you have to 0-1 drink a day. ? Be aware of how much alcohol is in your drink. In the [place] one drink equals one 12 oz bottle of beer (355 mL), one 5 oz glass of wine (148 mL), or one 1 oz glass of hard liquor (44 mL). Lifestyle  Take daily care of your teeth and gums.  Stay active. Exercise for at least 30 minutes on 5 or more days each week.  Do not use any products that  contain nicotine or tobacco, such as cigarettes, e-cigarettes, and chewing tobacco. If you need help quitting, ask your health care provider.  If you are sexually active, practice safe sex. Use a condom or other form of protection in order to prevent STIs (sexually transmitted infections).  Talk with your health care provider about taking a low-dose aspirin or statin. What's next?  Go to your health care provider once a year for a well check visit.  Ask your health care provider how often you should have your eyes and teeth checked.  Stay up to date on all vaccines. This information is not intended to replace advice given to you by your health care provider. Make sure you discuss any questions you have with your health care provider. Document Revised: 07/06/2018 Document Reviewed: 07/06/2018 Elsevier Patient Education  2020 [employer].

## 2019-10-01 ENCOUNTER — Other Ambulatory Visit: Payer: Self-pay

## 2019-10-01 DIAGNOSIS — M1610 Unilateral primary osteoarthritis, unspecified hip: Secondary | ICD-10-CM | POA: Diagnosis not present

## 2019-10-01 DIAGNOSIS — M4126 Other idiopathic scoliosis, lumbar region: Secondary | ICD-10-CM | POA: Diagnosis not present

## 2019-10-01 DIAGNOSIS — M545 Low back pain: Secondary | ICD-10-CM | POA: Diagnosis not present

## 2019-10-01 DIAGNOSIS — M5416 Radiculopathy, lumbar region: Secondary | ICD-10-CM | POA: Diagnosis not present

## 2019-10-01 DIAGNOSIS — M48061 Spinal stenosis, lumbar region without neurogenic claudication: Secondary | ICD-10-CM | POA: Diagnosis not present

## 2019-10-02 ENCOUNTER — Encounter: Payer: Self-pay | Admitting: Nurse Practitioner

## 2019-10-02 ENCOUNTER — Ambulatory Visit (INDEPENDENT_AMBULATORY_CARE_PROVIDER_SITE_OTHER): Payer: Medicare Other | Admitting: Nurse Practitioner

## 2019-10-02 VITALS — BP 130/78 | HR 74 | Temp 95.9°F | Ht 65.0 in | Wt 210.6 lb

## 2019-10-02 DIAGNOSIS — M5416 Radiculopathy, lumbar region: Secondary | ICD-10-CM

## 2019-10-02 DIAGNOSIS — M5137 Other intervertebral disc degeneration, lumbosacral region: Secondary | ICD-10-CM

## 2019-10-02 DIAGNOSIS — I10 Essential (primary) hypertension: Secondary | ICD-10-CM

## 2019-10-02 DIAGNOSIS — M48061 Spinal stenosis, lumbar region without neurogenic claudication: Secondary | ICD-10-CM | POA: Diagnosis not present

## 2019-10-02 DIAGNOSIS — G8929 Other chronic pain: Secondary | ICD-10-CM

## 2019-10-02 DIAGNOSIS — F1120 Opioid dependence, uncomplicated: Secondary | ICD-10-CM | POA: Diagnosis not present

## 2019-10-02 MED ORDER — HYDROCODONE-ACETAMINOPHEN 7.5-325 MG PO TABS: 1.0000 | ORAL_TABLET | Freq: Three times a day (TID) | ORAL | 0 refills | Status: DC | PRN

## 2019-10-02 MED ORDER — AMLODIPINE BESYLATE 10 MG PO TABS: 10.0000 mg | ORAL_TABLET | Freq: Every day | ORAL | 3 refills | Status: DC

## 2019-10-02 NOTE — Progress Notes (Signed)
Subjective:  Patient ID: Heather Shaw, female    DOB: 12-02-1940  Age: 79 y.o. MRN: [id number]  CC: Follow-up (Pt states BP readings have been around 140/82//pt states shes doing good//pt wants pain meds sent to [facility] and BP meds to [facility] on [address])  HPI HTN: BP at goal with amlodipine BP Readings from Last 3 Encounters:  10/02/19 130/78  11/16/18 130/80  11/09/18 (!) 156/81   Chronic pain pain: Controlled with use of hydrocodone TID. She is not interested in use of cymbalta or lyrica. PMP database reviewed, no red flags Contract updated today UDS last collected 06/2019.  Reviewed past Medical, Social and Family history today.  Outpatient Medications Prior to Visit  Medication Sig Dispense Refill   atenolol (TENORMIN) 25 MG tablet TAKE 1 TABLET BY MOUTH EVERY DAY 90 tablet 1   Fluocinonide Emulsified Base 0.05 % CREA APPLY 1 APPLICATION TOPICALLY 2 (TWO) TIMES DAILY. 30 g 0   hydrochlorothiazide (HYDRODIURIL) 25 MG tablet TAKE 1 TABLET BY MOUTH EVERY DAY 90 tablet 1   magnesium hydroxide (MILK OF MAGNESIA) 400 MG/5ML suspension Take by mouth daily as needed for mild constipation.     meclizine (ANTIVERT) 12.5 MG tablet Take 1 tablet (12.5 mg total) by mouth 3 (three) times daily as needed for dizziness. 30 tablet 0   Meth-Hyo-M Bl-Na Phos-Ph Sal (URO-MP) 118 MG CAPS Take 1 capsule by mouth 3 (three) times daily as needed.  3   polyethylene glycol powder (GLYCOLAX/MIRALAX) powder Dissolve 17 grams in at least 8 ounces water/juice and drink once daily 527 g 3   amLODipine (NORVASC) 10 MG tablet Take 1 tablet (10 mg total) by mouth daily. 90 tablet 0   HYDROcodone-acetaminophen (NORCO) 7.5-325 MG tablet TAKE 1 TABLET BY MOUTH Three times DAILY AS NEEDED. Need office visit for additional refills 90 tablet 0   No facility-administered medications prior to visit.    ROS See HPI  Objective:  BP 130/78    Pulse 74    Temp (!) 95.9 F (35.5 C) (Tympanic)     Ht 5\' 5"  (1.651 m)    Wt 210 lb 9.6 oz (95.5 kg)    SpO2 98%    BMI 35.05 kg/m   BP Readings from Last 3 Encounters:  10/02/19 130/78  11/16/18 130/80  11/09/18 (!) 156/81    Wt Readings from Last 3 Encounters:  10/02/19 210 lb 9.6 oz (95.5 kg)  11/16/18 214 lb 3.2 oz (97.2 kg)  05/24/18 208 lb (94.3 kg)    Physical Exam Vitals reviewed.  Cardiovascular:     Rate and Rhythm: Normal rate and regular rhythm.     Pulses: Normal pulses.     Heart sounds: Normal heart sounds.  Pulmonary:     Effort: Pulmonary effort is normal.     Breath sounds: Normal breath sounds.  Neurological:     Mental Status: She is alert and oriented to person, place, and time.     Lab Results  Component Value Date   WBC 9.4 05/24/2018   HGB 14.6 05/24/2018   HCT 42.1 05/24/2018   PLT 244.0 05/24/2018   GLUCOSE 128 (H) 07/13/2019   CHOL 218 (H) 05/24/2018   TRIG 231.0 (H) 05/24/2018   HDL 39.60 05/24/2018   LDLDIRECT 158.0 05/24/2018   LDLCALC 134 (H) 12/29/2015   ALT 10 07/13/2019   AST 15 07/13/2019   NA 139 07/13/2019   K 3.7 07/13/2019   CL 104 07/13/2019   CREATININE 0.94  07/13/2019   BUN 17 07/13/2019   CO2 27 07/13/2019   TSH 1.41 05/24/2018   HGBA1C 5.8 07/24/2019     Assessment & Plan:  This visit occurred during the SARS-CoV-2 public health emergency.  Safety protocols were in place, including screening questions prior to the visit, additional usage of staff PPE, and extensive cleaning of exam room while observing appropriate contact time as indicated for disinfecting solutions.   Heather Shaw was seen today for follow-up.  Diagnoses and all orders for this visit:  Essential hypertension -     amLODipine (NORVASC) 10 MG tablet; Take 1 tablet (10 mg total) by mouth daily.  Opioid type dependence, continuous (HCC)  Other chronic pain -     HYDROcodone-acetaminophen (NORCO) 7.5-325 MG tablet; Take 1 tablet by mouth every 8 (eight) hours as needed for moderate pain.  DDD  (degenerative disc disease), lumbosacral -     HYDROcodone-acetaminophen (NORCO) 7.5-325 MG tablet; Take 1 tablet by mouth every 8 (eight) hours as needed for moderate pain.  Lumbar radiculopathy, chronic -     HYDROcodone-acetaminophen (NORCO) 7.5-325 MG tablet; Take 1 tablet by mouth every 8 (eight) hours as needed for moderate pain.   I have changed Heather Shaw's HYDROcodone-acetaminophen. I am also having her maintain her magnesium hydroxide, polyethylene glycol powder, Uro-MP, Fluocinonide Emulsified Base, meclizine, atenolol, hydrochlorothiazide, and amLODipine.  Meds ordered this encounter  Medications   amLODipine (NORVASC) 10 MG tablet    Sig: Take 1 tablet (10 mg total) by mouth daily.    Dispense:  90 tablet    Refill:  3    Order Specific Question:   Supervising Provider    Answer:   TROYLENE, FRIGON [id number]   HYDROcodone-acetaminophen (NORCO) 7.5-325 MG tablet    Sig: Take 1 tablet by mouth every 8 (eight) hours as needed for moderate pain.    Dispense:  90 tablet    Refill:  0    Do not fill before 10/09/2019    Order Specific Question:   Supervising Provider    Answer:   Ronnald Nian [id number]    Problem List Items Addressed This Visit      Cardiovascular and Mediastinum   Essential hypertension - Primary   Relevant Medications   amLODipine (NORVASC) 10 MG tablet     Nervous and Auditory   Lumbar radiculopathy, chronic   Relevant Medications   HYDROcodone-acetaminophen (NORCO) 7.5-325 MG tablet     Musculoskeletal and Integument   DDD (degenerative disc disease), lumbosacral   Relevant Medications   HYDROcodone-acetaminophen (NORCO) 7.5-325 MG tablet     Other   Chronic pain   Relevant Medications   HYDROcodone-acetaminophen (NORCO) 7.5-325 MG tablet   Opioid type dependence, continuous ([place])      Follow-up: Return in about 3 months (around 01/02/2020) for HTN and chronic pain management (video appt, 59mins).  Wilfred Lacy, NP

## 2019-10-02 NOTE — Patient Instructions (Signed)
Your next hydrocodone refill is due no sooner than 10/09/2019. Maintain current medications.

## 2019-10-04 DIAGNOSIS — M48061 Spinal stenosis, lumbar region without neurogenic claudication: Secondary | ICD-10-CM | POA: Diagnosis not present

## 2019-10-09 DIAGNOSIS — M48061 Spinal stenosis, lumbar region without neurogenic claudication: Secondary | ICD-10-CM | POA: Diagnosis not present

## 2019-10-10 MED FILL — HYDROCODON-APAP 7.5-325: 7.5-325 | 30 days supply | Qty: 90 | Fill #0

## 2019-10-16 DIAGNOSIS — M48061 Spinal stenosis, lumbar region without neurogenic claudication: Secondary | ICD-10-CM | POA: Diagnosis not present

## 2019-10-18 DIAGNOSIS — M48061 Spinal stenosis, lumbar region without neurogenic claudication: Secondary | ICD-10-CM | POA: Diagnosis not present

## 2019-11-05 ENCOUNTER — Telehealth: Payer: Self-pay | Admitting: Nurse Practitioner

## 2019-11-05 DIAGNOSIS — M5416 Radiculopathy, lumbar region: Secondary | ICD-10-CM

## 2019-11-05 DIAGNOSIS — M5137 Other intervertebral disc degeneration, lumbosacral region: Secondary | ICD-10-CM

## 2019-11-05 DIAGNOSIS — M48061 Spinal stenosis, lumbar region without neurogenic claudication: Secondary | ICD-10-CM | POA: Diagnosis not present

## 2019-11-05 DIAGNOSIS — G8929 Other chronic pain: Secondary | ICD-10-CM

## 2019-11-05 MED ORDER — HYDROCODONE-ACETAMINOPHEN 7.5-325 MG PO TABS: 1.0000 | ORAL_TABLET | Freq: Three times a day (TID) | ORAL | 0 refills | Status: DC | PRN

## 2019-11-05 NOTE — Telephone Encounter (Signed)
Patient is calling and requesting a refill for hydrocodone sent to [facility]. CB is [phone]

## 2019-11-05 NOTE — Telephone Encounter (Signed)
Left voicemail for pt that refill was sent.

## 2019-11-05 NOTE — Telephone Encounter (Signed)
Please advise,  Last ov 10/02/2019 Last refill 10/02/19 for 90 tab  PMP checked last 3 pick up from [facility] #[id number]   10/10/19 09/12/19 08/15/19

## 2019-11-05 NOTE — Telephone Encounter (Signed)
Refill sent.

## 2019-11-09 MED FILL — HYDROCODON-APAP 7.5-325: 7.5-325 | 30 days supply | Qty: 90 | Fill #0

## 2019-11-14 ENCOUNTER — Encounter: Payer: Self-pay | Admitting: Dermatology

## 2019-11-14 ENCOUNTER — Other Ambulatory Visit: Payer: Self-pay

## 2019-11-14 ENCOUNTER — Ambulatory Visit (INDEPENDENT_AMBULATORY_CARE_PROVIDER_SITE_OTHER): Payer: Medicare Other | Admitting: Dermatology

## 2019-11-14 DIAGNOSIS — D485 Neoplasm of uncertain behavior of skin: Secondary | ICD-10-CM

## 2019-11-14 DIAGNOSIS — D225 Melanocytic nevi of trunk: Secondary | ICD-10-CM

## 2019-11-14 DIAGNOSIS — D229 Melanocytic nevi, unspecified: Secondary | ICD-10-CM

## 2019-11-14 DIAGNOSIS — L720 Epidermal cyst: Secondary | ICD-10-CM | POA: Diagnosis not present

## 2019-11-14 NOTE — Patient Instructions (Signed)

## 2019-11-18 ENCOUNTER — Encounter: Payer: Self-pay | Admitting: Dermatology

## 2019-11-18 MED FILL — AMLODIPINE BESYLATE 10 MG T: 10 | 90 days supply | Qty: 90 | Fill #0

## 2019-11-18 NOTE — Progress Notes (Addendum)
   New Patient   Subjective  Heather Shaw is a 79 y.o. female who presents for the following: No chief complaint on file.Wyatt Haste Location: Over left lip Duration: Few years Quality: Larger Associated Signs/Symptoms: Modifying Factors:  Severity:  Timing: Context:    The following portions of the chart were reviewed this encounter and updated as appropriate: Tobacco  Allergies  Meds  Problems  Med Hx  Surg Hx  Fam Hx      Objective  Well appearing patient in no apparent distress; mood and affect are within normal limits.  All sun exposed areas plus back examined.   Assessment & Plan  Neoplasm of uncertain behavior of skin Left Upper Cutaneous Lip  Skin / nail biopsy Type of biopsy: tangential   Informed consent: discussed and consent obtained   Anesthesia: the lesion was anesthetized in a standard fashion   Anesthetic:  1% lidocaine w/ epinephrine 1-100,000 local infiltration Instrument used: flexible razor blade   Hemostasis achieved with: ferric subsulfate   Outcome: patient tolerated procedure well   Post-procedure details: sterile dressing applied and wound care instructions given   Dressing type: petrolatum   Additional details:  Patient identified lesion of concern.  Lesion identified by physician.  Specimen 1 - Surgical pathology Differential Diagnosis: bcc vs scc Check Margins: No  Nevus (2) Left Upper Back; Right Upper Back

## 2019-11-20 DIAGNOSIS — M48061 Spinal stenosis, lumbar region without neurogenic claudication: Secondary | ICD-10-CM | POA: Diagnosis not present

## 2019-12-05 ENCOUNTER — Telehealth: Payer: Self-pay | Admitting: Dermatology

## 2019-12-05 ENCOUNTER — Telehealth: Payer: Self-pay | Admitting: Nurse Practitioner

## 2019-12-05 DIAGNOSIS — M4126 Other idiopathic scoliosis, lumbar region: Secondary | ICD-10-CM | POA: Diagnosis not present

## 2019-12-05 DIAGNOSIS — M5137 Other intervertebral disc degeneration, lumbosacral region: Secondary | ICD-10-CM

## 2019-12-05 DIAGNOSIS — M48061 Spinal stenosis, lumbar region without neurogenic claudication: Secondary | ICD-10-CM | POA: Diagnosis not present

## 2019-12-05 DIAGNOSIS — M1610 Unilateral primary osteoarthritis, unspecified hip: Secondary | ICD-10-CM | POA: Diagnosis not present

## 2019-12-05 DIAGNOSIS — M5416 Radiculopathy, lumbar region: Secondary | ICD-10-CM | POA: Diagnosis not present

## 2019-12-05 DIAGNOSIS — M545 Low back pain: Secondary | ICD-10-CM | POA: Diagnosis not present

## 2019-12-05 DIAGNOSIS — G8929 Other chronic pain: Secondary | ICD-10-CM

## 2019-12-05 MED ORDER — HYDROCODONE-ACETAMINOPHEN 7.5-325 MG PO TABS: 1.0000 | ORAL_TABLET | Freq: Three times a day (TID) | ORAL | 0 refills | Status: DC | PRN

## 2019-12-05 NOTE — Telephone Encounter (Signed)
Patient is calling and requesting a refill for hydrocodone sent to [facility]. CB is [phone].

## 2019-12-05 NOTE — Telephone Encounter (Signed)
Charlotte please advise.  Pt called wanting refill on Hydrocodone sent to [facility].  Hydrocodone   90t   0-refills  Last prescribed-11/05/19  Last OV-10/02/19

## 2019-12-05 NOTE — Telephone Encounter (Signed)
Unable to see medication record on PMP database. Verified date of last refill with pharmacy personnel: 11/09/2019. She is due for office visit next month. rx sent. Cannot pick up sooner than 12/09/2019.

## 2019-12-05 NOTE — Telephone Encounter (Signed)
Pathology results to patient no further treatment needed

## 2019-12-05 NOTE — Telephone Encounter (Signed)
Pt was notified and verbally understood to pick up he prescription 12/09/19.  Pt has a video visit scheduled for 01/04/20 at 2pm.

## 2019-12-10 DIAGNOSIS — R102 Pelvic and perineal pain: Secondary | ICD-10-CM | POA: Diagnosis not present

## 2019-12-10 DIAGNOSIS — N3010 Interstitial cystitis (chronic) without hematuria: Secondary | ICD-10-CM | POA: Diagnosis not present

## 2019-12-10 DIAGNOSIS — R300 Dysuria: Secondary | ICD-10-CM | POA: Diagnosis not present

## 2019-12-11 DIAGNOSIS — M48061 Spinal stenosis, lumbar region without neurogenic claudication: Secondary | ICD-10-CM | POA: Diagnosis not present

## 2020-01-04 ENCOUNTER — Other Ambulatory Visit: Payer: Self-pay

## 2020-01-04 ENCOUNTER — Telehealth (INDEPENDENT_AMBULATORY_CARE_PROVIDER_SITE_OTHER): Payer: Medicare Other | Admitting: Nurse Practitioner

## 2020-01-04 ENCOUNTER — Encounter: Payer: Self-pay | Admitting: Nurse Practitioner

## 2020-01-04 VITALS — BP 143/83 | Ht 65.0 in | Wt 201.0 lb

## 2020-01-04 DIAGNOSIS — I471 Supraventricular tachycardia: Secondary | ICD-10-CM | POA: Diagnosis not present

## 2020-01-04 DIAGNOSIS — M5416 Radiculopathy, lumbar region: Secondary | ICD-10-CM | POA: Diagnosis not present

## 2020-01-04 DIAGNOSIS — G8929 Other chronic pain: Secondary | ICD-10-CM | POA: Diagnosis not present

## 2020-01-04 DIAGNOSIS — I10 Essential (primary) hypertension: Secondary | ICD-10-CM | POA: Diagnosis not present

## 2020-01-04 DIAGNOSIS — M5137 Other intervertebral disc degeneration, lumbosacral region: Secondary | ICD-10-CM

## 2020-01-04 MED ORDER — HYDROCHLOROTHIAZIDE 25 MG PO TABS: 25.0000 mg | ORAL_TABLET | Freq: Every day | ORAL | 1 refills | Status: DC

## 2020-01-04 MED ORDER — HYDROCODONE-ACETAMINOPHEN 7.5-325 MG PO TABS: 1.0000 | ORAL_TABLET | Freq: Three times a day (TID) | ORAL | 0 refills | Status: DC | PRN

## 2020-01-04 MED ORDER — ATENOLOL 25 MG PO TABS: 25.0000 mg | ORAL_TABLET | Freq: Every day | ORAL | 3 refills | Status: DC

## 2020-01-04 MED ORDER — GABAPENTIN 100 MG PO CAPS: 100.0000 mg | ORAL_CAPSULE | Freq: Every day | ORAL | 5 refills | Status: DC

## 2020-01-04 NOTE — Progress Notes (Signed)
Virtual Visit via Video Note  I connected with@ on 01/04/20 at  2:00 PM EDT by a video enabled telemedicine application and verified that I am speaking with the correct person using two identifiers.  Location: Patient:Home Provider: Office Participants: patient and provider  I discussed the limitations of evaluation and management by telemedicine and the availability of in person appointments. I also discussed with the patient that there may be a patient responsible charge related to this service. The patient expressed understanding and agreed to proceed.  [id number] pain management and HTN f/up  History of Present Illness: Ms. Gustafson reports need to additional pain medication at bedtime due to worsened pain. She an additional 1/2tabmid-day and at bedtime. Denies any fall in last 74months, no constipation, no syncope, no daytime somnolence. Denies any MVA. She completed outpatient PT and now performing home exercises to maintain mobility.  HTN: Stable with amlodipine and HCTZ BP Readings from Last 3 Encounters:  01/04/20 (!) 143/83  10/02/19 130/78  11/16/18 130/80   SVT: Rate controlled with atenolol No palpitations or syncope or dizziness.  Observations/Objective: Physical Exam Constitutional:      General: She is not in acute distress. Pulmonary:     Effort: Pulmonary effort is normal.  Neurological:     Mental Status: She is alert and oriented to person, place, and time.  Psychiatric:        Mood and Affect: Mood normal.        Behavior: Behavior normal.        Thought Content: Thought content normal.    Assessment and Plan: Aunya was seen today for follow-up.  Diagnoses and all orders for this visit:  DDD (degenerative disc disease), lumbosacral -     gabapentin (NEURONTIN) 100 MG capsule; Take 1-2 capsules (100-200 mg total) by mouth at bedtime. -     HYDROcodone-acetaminophen (NORCO) 7.5-325 MG tablet; Take 1 tablet by mouth every 8 (eight) hours as needed for  moderate pain (chronic back pain).  Essential hypertension -     atenolol (TENORMIN) 25 MG tablet; Take 1 tablet (25 mg total) by mouth daily. -     hydrochlorothiazide (HYDRODIURIL) 25 MG tablet; Take 1 tablet (25 mg total) by mouth daily.  Other chronic pain -     gabapentin (NEURONTIN) 100 MG capsule; Take 1-2 capsules (100-200 mg total) by mouth at bedtime. -     HYDROcodone-acetaminophen (NORCO) 7.5-325 MG tablet; Take 1 tablet by mouth every 8 (eight) hours as needed for moderate pain (chronic back pain).  Lumbar radiculopathy, chronic -     HYDROcodone-acetaminophen (NORCO) 7.5-325 MG tablet; Take 1 tablet by mouth every 8 (eight) hours as needed for moderate pain (chronic back pain).  SVT (supraventricular tachycardia) (HCC) -     atenolol (TENORMIN) 25 MG tablet; Take 1 tablet (25 mg total) by mouth daily.   Follow Up Instructions: Maintain hydrocodone dose at 1tab TID, refill due 01/09/2020. Start gabapentin 100mg  at hs x 1week, then 200mg  at hs. F/up in 26month (F2F)   I discussed the assessment and treatment plan with the patient. The patient was provided an opportunity to ask questions and all were answered. The patient agreed with the plan and demonstrated an understanding of the instructions.   The patient was advised to call back or seek an in-person evaluation if the symptoms worsen or if the condition fails to improve as anticipated.  Daphane Shepherd, NP

## 2020-01-04 NOTE — Assessment & Plan Note (Signed)
Maintain hydrocodone dose at 1tab TID Start gabapentin 100mg  at hs x 1week, then 200mg  at hs. F/up in 61month (F2F)

## 2020-01-07 ENCOUNTER — Telehealth: Payer: Self-pay | Admitting: Nurse Practitioner

## 2020-01-07 NOTE — Telephone Encounter (Signed)
Pt calling about HYDROcodone-acetaminophen ([place]) 7.5-325 MG tablet, she said she will be out tomorrow because she takes her last one tonight, She said picked it up 01/10/20 and that it was filled 01/09/20. She wanted to know if a message could be sent to the pharmacy where she could pick it up tomorrow. Please advise

## 2020-01-07 NOTE — Telephone Encounter (Signed)
Hydrocodone rx will be filled only every 30days.

## 2020-01-07 NOTE — Telephone Encounter (Signed)
Pt was notified and verbally understood the 30 day wait.  Heather Shaw please advise pt was wondering if it mattered when she picked up the prescription she states she didn't pick up till May 17th and if the months with 31 days make a difference. I did explain to her that there is 30 days between the prescriptions. Pt understands this time she can't pick up till 6/16.

## 2020-01-14 ENCOUNTER — Other Ambulatory Visit: Payer: Self-pay

## 2020-01-15 ENCOUNTER — Encounter: Payer: Self-pay | Admitting: Nurse Practitioner

## 2020-01-15 ENCOUNTER — Encounter: Payer: Medicare Other | Admitting: Nurse Practitioner

## 2020-01-15 ENCOUNTER — Other Ambulatory Visit: Payer: Self-pay

## 2020-01-16 ENCOUNTER — Encounter: Payer: Self-pay | Admitting: Nurse Practitioner

## 2020-01-16 ENCOUNTER — Ambulatory Visit (INDEPENDENT_AMBULATORY_CARE_PROVIDER_SITE_OTHER): Payer: Medicare Other | Admitting: Nurse Practitioner

## 2020-01-16 VITALS — BP 118/68 | HR 60 | Temp 96.5°F | Ht 65.0 in | Wt 211.0 lb

## 2020-01-16 DIAGNOSIS — M5416 Radiculopathy, lumbar region: Secondary | ICD-10-CM

## 2020-01-16 NOTE — Progress Notes (Signed)
Subjective:  Patient ID: Heather Shaw, female    DOB: 03-02-1941  Age: 79 y.o. MRN: [id number]  CC: Edema (legs swelling-right is worse than left//pt stated 2 Mondays ago right leg couldn't bare any weight she had to use walker//pt worried about blood clots due to fam hx of blood clots)  Leg Pain  The incident occurred more than 1 week ago. There was no injury mechanism. The pain is present in the left leg and right leg. The quality of the pain is described as aching. The pain has been fluctuating since onset. Associated symptoms include numbness and tingling. Pertinent negatives include no inability to bear weight, loss of motion, loss of sensation or muscle weakness. She reports no foreign bodies present. The symptoms are aggravated by weight bearing. Treatments tried: gabapentin and hydrocodone. The treatment provided moderate relief.  hx of lumbar spinal stenosis with radiculopathy. No immobilization, no travel  Reviewed past Medical, Social and Family history today.  Outpatient Medications Prior to Visit  Medication Sig Dispense Refill  . amLODipine (NORVASC) 10 MG tablet Take 1 tablet (10 mg total) by mouth daily. 90 tablet 3  . atenolol (TENORMIN) 25 MG tablet Take 1 tablet (25 mg total) by mouth daily. 90 tablet 3  . Fluocinonide Emulsified Base 0.05 % CREA APPLY 1 APPLICATION TOPICALLY 2 (TWO) TIMES DAILY. 30 g 0  . gabapentin (NEURONTIN) 100 MG capsule Take 1-2 capsules (100-200 mg total) by mouth at bedtime. 60 capsule 5  . hydrochlorothiazide (HYDRODIURIL) 25 MG tablet Take 1 tablet (25 mg total) by mouth daily. 90 tablet 1  . HYDROcodone-acetaminophen (NORCO) 7.5-325 MG tablet Take 1 tablet by mouth every 8 (eight) hours as needed for moderate pain (chronic back pain). 90 tablet 0  . magnesium hydroxide (MILK OF MAGNESIA) 400 MG/5ML suspension Take by mouth daily as needed for mild constipation.    . meclizine (ANTIVERT) 12.5 MG tablet Take 1 tablet (12.5 mg total) by mouth 3  (three) times daily as needed for dizziness. 30 tablet 0  . Meth-Hyo-M Bl-Na Phos-Ph Sal (URO-MP) 118 MG CAPS Take 1 capsule by mouth 3 (three) times daily as needed.   3  . polyethylene glycol powder (GLYCOLAX/MIRALAX) powder Dissolve 17 grams in at least 8 ounces water/juice and drink once daily 527 g 3   No facility-administered medications prior to visit.    ROS See HPI  Objective:  BP 118/68   Pulse 60   Temp (!) 96.5 F (35.8 C) (Tympanic)   Ht 5\' 5"  (1.651 m)   Wt 211 lb (95.7 kg)   SpO2 96%   BMI 35.11 kg/m   BP Readings from Last 3 Encounters:  01/16/20 118/68  01/04/20 (!) 143/83  10/02/19 130/78    Wt Readings from Last 3 Encounters:  01/16/20 211 lb (95.7 kg)  01/15/20 205 lb (93 kg)  01/04/20 201 lb (91.2 kg)    Physical Exam Pulmonary:     Effort: Pulmonary effort is normal.  Musculoskeletal:        General: Tenderness present. No swelling.     Lumbar back: Tenderness present. Decreased range of motion. Negative right straight leg raise test and negative left straight leg raise test.     Right hip: Bony tenderness present. Normal strength.     Left hip: Bony tenderness present. Normal strength.     Right lower leg: Normal. No edema.     Left lower leg: Normal. No edema.     Left ankle: Normal.  Right foot: Normal.     Left foot: Normal.  Skin:    Findings: No erythema or rash.  Neurological:     Mental Status: She is alert and oriented to person, place, and time.    Lab Results  Component Value Date   WBC 9.4 05/24/2018   HGB 14.6 05/24/2018   HCT 42.1 05/24/2018   PLT 244.0 05/24/2018   GLUCOSE 128 (H) 07/13/2019   CHOL 218 (H) 05/24/2018   TRIG 231.0 (H) 05/24/2018   HDL 39.60 05/24/2018   LDLDIRECT 158.0 05/24/2018   LDLCALC 134 (H) 12/29/2015   ALT 10 07/13/2019   AST 15 07/13/2019   NA 139 07/13/2019   K 3.7 07/13/2019   CL 104 07/13/2019   CREATININE 0.94 07/13/2019   BUN 17 07/13/2019   CO2 27 07/13/2019   TSH 1.41  05/24/2018   HGBA1C 5.8 07/24/2019    MR Lumbar Spine Wo Contrast  Result Date: 08/01/2019 CLINICAL DATA:  Low back pain radiating into both legs. Increased urinary frequency. Patient reports chronic symptoms which are worsening over the last several weeks. No acute injury or prior relevant surgery. EXAM: MRI LUMBAR SPINE WITHOUT CONTRAST TECHNIQUE: Multiplanar, multisequence MR imaging of the lumbar spine was performed. No intravenous contrast was administered. COMPARISON:  Lumbar MRI 01/29/2016. Abdominopelvic CT 04/15/2014 FINDINGS: Segmentation: Conventional anatomy assumed, with the last open disc space designated L5-S1.Concordant with previous imaging. Alignment: Stable and near anatomic. There is a mild scoliosis convex to the left at L1-2 and to the right at L4. Vertebrae: No worrisome osseous lesion, acute fracture or pars defect. Mildly progressive endplate degenerative changes.The visualized sacroiliac joints appear unremarkable. Conus medullaris: Extends to the L1 level and appears normal. Paraspinal and other soft tissues: No significant paraspinal findings. Disc levels: Sagittal images demonstrate stable disc degeneration with disc bulging and endplate osteophytes at T11-12. No resulting cord deformity. Chronic left-greater-than-right foraminal narrowing appears unchanged. T12-L1: Minimal disc bulging. No spinal stenosis or nerve root encroachment. L1-2: Stable disc bulging and endplate osteophytes asymmetric to the right. Mild facet and ligamentous hypertrophy. No significant spinal stenosis or nerve root encroachment. L2-3: Stable annular disc bulging, small right paracentral disc protrusion and endplate osteophytes. Mild facet and ligamentous hypertrophy. These factors contribute to mild spinal stenosis and mild narrowing of the foramina and right lateral recess. Overall appearance is stable. L3-4: Stable loss of disc height with annular disc bulging and endplate osteophytes asymmetric to the  left. Mild facet and ligamentous hypertrophy. No significant spinal stenosis or nerve root encroachment. L4-5: Chronic loss of disc height with annular disc bulging and endplate osteophytes asymmetric to the left. Mild facet and ligamentous hypertrophy with possible postsurgical changes on the right, as before. Stable narrowing of the right lateral recess and both neural foramina, left greater than right. L5-S1: Stable disc bulging, endplate osteophytes and facet hypertrophy. Stable mild foraminal narrowing bilaterally without nerve root encroachment. IMPRESSION: 1. No acute findings or explanation for the patient's symptoms. 2. Multilevel spondylosis with disc bulging, endplate osteophytes and facet hypertrophy as described, similar to previous MRI from 2017. 3. Stable mild multifactorial spinal stenosis at L2-3 with mild narrowing of the foramina and right lateral recess. 4. Stable narrowing of the right lateral recess and both neural foramina at L4-5. Electronically Signed   By: Richardean Sale M.D.   On: 08/01/2019 17:10    Assessment & Plan:  This visit occurred during the SARS-CoV-2 public health emergency.  Safety protocols were in place, including screening questions prior  to the visit, additional usage of staff PPE, and extensive cleaning of exam room while observing appropriate contact time as indicated for disinfecting solutions.   Tristin was seen today for edema.  Diagnoses and all orders for this visit:  Lumbar radiculopathy, chronic  Leg pain is due to chronic back pain. Continue current medications. In the absence of leg swelling or redness, I do not think this is due to DVT.  I am having [place] maintain her magnesium hydroxide, polyethylene glycol powder, Uro-MP, Fluocinonide Emulsified Base, meclizine, amLODipine, gabapentin, HYDROcodone-acetaminophen, atenolol, and hydrochlorothiazide.  No orders of the defined types were placed in this encounter.   Problem List Items  Addressed This Visit      Nervous and Auditory   Lumbar radiculopathy, chronic - Primary       Follow-up: No follow-ups on file.  Wilfred Lacy, NP

## 2020-01-16 NOTE — Patient Instructions (Addendum)
Leg pain is due to chronic back pain. Continue current medications.  In the absence of leg swelling or redness, I do not think this is due to DVT.

## 2020-01-16 NOTE — Progress Notes (Signed)
This encounter was created in error - please disregard.

## 2020-01-20 DIAGNOSIS — R609 Edema, unspecified: Secondary | ICD-10-CM | POA: Diagnosis not present

## 2020-01-25 ENCOUNTER — Emergency Department (HOSPITAL_BASED_OUTPATIENT_CLINIC_OR_DEPARTMENT_OTHER): Payer: Medicare Other

## 2020-01-25 ENCOUNTER — Encounter (HOSPITAL_COMMUNITY): Payer: Self-pay | Admitting: Emergency Medicine

## 2020-01-25 ENCOUNTER — Other Ambulatory Visit: Payer: Self-pay

## 2020-01-25 ENCOUNTER — Emergency Department (HOSPITAL_COMMUNITY): Payer: Medicare Other

## 2020-01-25 ENCOUNTER — Telehealth: Payer: Self-pay

## 2020-01-25 ENCOUNTER — Emergency Department (HOSPITAL_COMMUNITY)
Admission: EM | Admit: 2020-01-25 | Discharge: 2020-01-25 | Disposition: A | Discharge status: Home / Self Care | Payer: Medicare Other | Attending: Emergency Medicine | Admitting: Emergency Medicine

## 2020-01-25 DIAGNOSIS — R52 Pain, unspecified: Secondary | ICD-10-CM | POA: Diagnosis not present

## 2020-01-25 DIAGNOSIS — M25552 Pain in left hip: Secondary | ICD-10-CM | POA: Diagnosis not present

## 2020-01-25 DIAGNOSIS — Z87891 Personal history of nicotine dependence: Secondary | ICD-10-CM | POA: Diagnosis not present

## 2020-01-25 DIAGNOSIS — Z79899 Other long term (current) drug therapy: Secondary | ICD-10-CM | POA: Diagnosis not present

## 2020-01-25 DIAGNOSIS — R600 Localized edema: Secondary | ICD-10-CM | POA: Insufficient documentation

## 2020-01-25 DIAGNOSIS — M7989 Other specified soft tissue disorders: Secondary | ICD-10-CM

## 2020-01-25 DIAGNOSIS — I10 Essential (primary) hypertension: Secondary | ICD-10-CM | POA: Insufficient documentation

## 2020-01-25 DIAGNOSIS — M533 Sacrococcygeal disorders, not elsewhere classified: Secondary | ICD-10-CM | POA: Diagnosis not present

## 2020-01-25 LAB — CBC WITH DIFFERENTIAL/PLATELET
Abs Immature Granulocytes: 0.02 10*3/uL (ref 0.00–0.07)
Basophils Absolute: 0.1 10*3/uL (ref 0.0–0.1)
Basophils Relative: 1 %
Eosinophils Absolute: 0.2 10*3/uL (ref 0.0–0.5)
Eosinophils Relative: 3 %
HCT: 43.7 % (ref 36.0–46.0)
Hemoglobin: 14.4 g/dL (ref 12.0–15.0)
Immature Granulocytes: 0 %
Lymphocytes Relative: 35 %
Lymphs Abs: 2.8 10*3/uL (ref 0.7–4.0)
MCH: 29.2 pg (ref 26.0–34.0)
MCHC: 33 g/dL (ref 30.0–36.0)
MCV: 88.6 fL (ref 80.0–100.0)
Monocytes Absolute: 0.5 10*3/uL (ref 0.1–1.0)
Monocytes Relative: 7 %
Neutro Abs: 4.4 10*3/uL (ref 1.7–7.7)
Neutrophils Relative %: 54 %
Platelets: 230 10*3/uL (ref 150–400)
RBC: 4.93 MIL/uL (ref 3.87–5.11)
RDW: 12.4 % (ref 11.5–15.5)
WBC: 8 10*3/uL (ref 4.0–10.5)
nRBC: 0 % (ref 0.0–0.2)

## 2020-01-25 LAB — BASIC METABOLIC PANEL
Anion gap: 10 (ref 5–15)
BUN: 14 mg/dL (ref 8–23)
CO2: 28 mmol/L (ref 22–32)
Calcium: 9.1 mg/dL (ref 8.9–10.3)
Chloride: 100 mmol/L (ref 98–111)
Creatinine, Ser: 0.68 mg/dL (ref 0.44–1.00)
GFR calc Af Amer: >60 mL/min (ref >60)
GFR calc non Af Amer: >60 mL/min (ref >60)
Glucose, Bld: 102 mg/dL — ABNORMAL HIGH (ref 70–99)
Potassium: 3.8 mmol/L (ref 3.5–5.1)
Sodium: 138 mmol/L (ref 135–145)

## 2020-01-25 LAB — BRAIN NATRIURETIC PEPTIDE: B Natriuretic Peptide: 81.2 pg/mL (ref 0.0–100.0)

## 2020-01-25 LAB — D-DIMER, QUANTITATIVE: D-Dimer, Quant: 1.99 ug/mL-FEU — ABNORMAL HIGH (ref 0.00–0.50)

## 2020-01-25 NOTE — Telephone Encounter (Signed)
Charlotte please advise.  Pt called in stating that Sunday she came back home from the beach and her legs was so swollen she couldn't even get her shoes on so her daughter took her down to a dr in [facility] and they drew her blood and said it came back positive for blood clots. Pt stated they are running more tests at the beginning of the week next week but she wanted to make her PCP aware and make sure that there isn't anything she needed to be concerned about now or immediately since it is blood clots she said shes just concerned. She said all this information and testing will be send to PCP.

## 2020-01-25 NOTE — ED Notes (Signed)
Vascular tech at pt bedside. 

## 2020-01-25 NOTE — ED Triage Notes (Signed)
Per pt, states she was having B/L edema in her lower extremities states she had blood work done at [place] and it was "positive"-PCP told her to come to ED for vascular study

## 2020-01-25 NOTE — Discharge Instructions (Addendum)
Please follow-up with your primary doctor regarding your leg swelling and your left hip pain. If at any point you develop chest pain, difficulty breathing, episodes of lightheadedness or passing out, please return to the ER for reassessment.

## 2020-01-25 NOTE — Telephone Encounter (Signed)
If venous doppler and CT chest was not done, she need to go to ED for additional evaluation.

## 2020-01-25 NOTE — Telephone Encounter (Signed)
Heather Shaw.  Pt was notified and verbally understood to go to the ED for evaluation because a venous doppler wasn't performed nor a CT.

## 2020-01-25 NOTE — Progress Notes (Signed)
Bilateral lower extremity venous duplex completed. Refer to "CV Proc" under chart review to view preliminary results.  01/25/2020 8:18 PM Kelby Aline., MHA, RVT, RDCS, RDMS

## 2020-01-25 NOTE — Telephone Encounter (Signed)
error 

## 2020-01-26 NOTE — ED Provider Notes (Signed)
[facility] DEPT Provider Note   CSN: [id number] Arrival date & time: 01/25/20  1647     History Chief Complaint  Patient presents with  . Leg Shaw    Heather Shaw is a 79 y.o. female. Presented with concern for leg Shaw, Heather Shaw. No recent falls. No Shaw of joint or redness noted. No fever. She has had no associated chest Shaw, sob, palpitaitons. Went to UC and was told she had a positive D Dimer and recommended come to ER for DVT study.     HPI     Past Medical History:  Diagnosis Date  . Arthritis   . Bladder Shaw   . Bradycardia   . DDD (degenerative disc disease), lumbosacral   . Hyperlipidemia   . Hyperplastic colon polyp   . Hypertension   . IC (interstitial cystitis)   . Internal hemorrhoids   . Polyp of rectum   . PONV (postoperative nausea and vomiting)   . Squamous papilloma    in esophageal polyp  . Uterine fibroid   . Vertigo   . Wears glasses   . Wears partial dentures     Patient Active Problem List   Diagnosis Date Noted  . Opioid type dependence, continuous ([place]) 08/15/2019  . Bilateral leg Shaw 07/09/2019  . Pediculosis 04/06/2019  . Grief 05/24/2018  . Right wrist tendonitis 08/24/2017  . Chronic Shaw 08/11/2017  . Hearing loss 11/23/2016  . SVT (supraventricular tachycardia) ([place])   . Angio-edema   . Carpal tunnel syndrome 09/19/2014  . Eczema 09/19/2014  . Chronic interstitial cystitis 05/20/2014  . Lumbar radiculopathy, chronic 12/31/2013  . DDD (degenerative disc disease), lumbosacral 04/07/2011  . Insomnia 04/06/2010  . Vitamin D deficiency 10/03/2009  . HLD (hyperlipidemia) 07/16/2009  . Essential hypertension 07/16/2009    Past Surgical History:  Procedure Laterality Date  . COLONOSCOPY W/ POLYPECTOMY  2009  . CYSTO WITH  HYDRODISTENSION N/A 05/20/2014   Procedure: CYSTOSCOPY/HYDRODISTENSION, INSTILLATION OF CHLORPACTIN;  Surgeon: Bernestine Amass, MD;  Location: [facility];  Service: Urology;  Laterality: N/A;  . CYSTO/  HYDRODISTENTION/  INSTILLATION CLORPACTIC  09-21-2010  . DILATION AND CURETTAGE OF UTERUS  1988  . KNEE ARTHROSCOPY Right 11-18-2009  . TRIGGER FINGER RELEASE Right 08/23/2016   Procedure: RIGHT RING FINGER TRIGGER RELEASE;  Surgeon: Milly Jakob, MD;  Location: [place];  Service: Orthopedics;  Laterality: Right;     OB History   No obstetric history on file.     Family History  Problem Relation Age of Onset  . Clotting disorder Mother 5  . COPD Father   . Heart attack Father 53  . Emphysema Father   . Stroke Paternal Aunt   . Hypertension Sister   . Hypertension Brother     Social History   Tobacco Use  . Smoking status: Former Smoker    Types: Cigarettes    Quit date: 05/17/1971    Years since quitting: 48.7  . Smokeless tobacco: Never Used  Substance Use Topics  . Alcohol use: No    Alcohol/week: 0.0 standard drinks  . Drug use: No    Home Medications Prior to Admission medications   Medication Sig Start Date End Date Taking? Authorizing Provider  amLODipine (NORVASC) 10 MG tablet Take 1 tablet (10 mg  total) by mouth daily. 10/02/19   [name], Charlene Brooke, NP  atenolol (TENORMIN) 25 MG tablet Take 1 tablet (25 mg total) by mouth daily. 01/04/20   [name], Charlene Brooke, NP  Fluocinonide Emulsified Base 0.05 % CREA APPLY 1 APPLICATION TOPICALLY 2 (TWO) TIMES DAILY. 01/05/17   Lucille Passy, MD  gabapentin (NEURONTIN) 100 MG capsule Take 1-2 capsules (100-200 mg total) by mouth at bedtime. 01/04/20   [name], Charlene Brooke, NP  hydrochlorothiazide (HYDRODIURIL) 25 MG tablet Take 1 tablet (25 mg total) by mouth daily. 01/04/20   [name], Charlene Brooke, NP  HYDROcodone-acetaminophen (NORCO) 7.5-325 MG tablet Take 1 tablet by mouth every 8 (eight) hours  as needed for moderate Shaw (chronic back Shaw). 01/09/20 02/08/20  [name], Charlene Brooke, NP  magnesium hydroxide (MILK OF MAGNESIA) 400 MG/5ML suspension Take by mouth daily as needed for mild constipation.    [provider]  meclizine (ANTIVERT) 12.5 MG tablet Take 1 tablet (12.5 mg total) by mouth 3 (three) times daily as needed for dizziness. 10/11/17   Lucille Passy, MD  Meth-Hyo-M Barnett Hatter Phos-Ph Sal (URO-MP) 118 MG CAPS Take 1 capsule by mouth 3 (three) times daily as needed.  10/05/16   [provider]  polyethylene glycol powder (GLYCOLAX/MIRALAX) powder Dissolve 17 grams in at least 8 ounces water/juice and drink once daily 12/24/15   Pyrtle, Lajuan Lines, MD    Allergies    Contrast media [iodinated diagnostic agents], Lisinopril, Allegra [fexofenadine], Codeine, Nsaids, Prednisone, Zithromax [azithromycin], and Sulfa antibiotics  Review of Systems   Review of Systems  Constitutional: Negative for chills and fever.  HENT: Negative for ear Shaw and sore throat.   Eyes: Negative for Shaw and visual disturbance.  Respiratory: Negative for cough and shortness of breath.   Cardiovascular: Negative for chest Shaw and palpitations.  Gastrointestinal: Negative for abdominal Shaw and vomiting.  Genitourinary: Negative for dysuria and hematuria.  Musculoskeletal: Positive for arthralgias and myalgias. Negative for back Shaw.  Skin: Negative for color change and rash.  Neurological: Negative for seizures and syncope.  All other systems reviewed and are negative.   Physical Exam Updated Vital Signs BP (!) 172/64 (BP Location: Left Arm)   Pulse (!) 49   Temp 97.8 F (36.6 C) (Oral)   Resp 16   SpO2 99%   Physical Exam Vitals and nursing note reviewed.  Constitutional:      General: She is not in acute distress.    Appearance: She is well-developed.  HENT:     Head: Normocephalic and atraumatic.  Eyes:     Conjunctiva/sclera: Conjunctivae normal.  Cardiovascular:     Rate  and Rhythm: Normal rate and regular rhythm.     Heart sounds: No murmur heard.   Pulmonary:     Effort: Pulmonary effort is normal. No respiratory distress.     Breath sounds: Normal breath sounds.  Abdominal:     General: Abdomen is flat.     Palpations: Abdomen is soft.     Tenderness: There is no abdominal tenderness.  Musculoskeletal:     Cervical back: Neck supple.     Comments: Lower extremities: there is mild lower leg non pitting Shaw, normal dp/pt pulses b/l, normal joint ROM throughout, left hip has mild TTP over lateral hip Ambulatory without issue  Skin:    General: Skin is warm and dry.  Neurological:     Mental Status: She is alert.     ED Results / Procedures / Treatments   Labs (  all labs ordered are listed, but only Heather results are displayed) Labs Reviewed  BASIC METABOLIC PANEL - Heather; Notable for the following components:      Result Value   Glucose, Bld 102 (*)    All other components within normal limits  D-DIMER, QUANTITATIVE (NOT AT [facility]) - Heather; Notable for the following components:   D-Dimer, Quant 1.99 (*)    All other components within normal limits  CBC WITH DIFFERENTIAL/PLATELET  BRAIN NATRIURETIC PEPTIDE    EKG EKG Interpretation  Date/Time:  Friday January 25 2020 18:01:39 EDT Ventricular Rate:  44 PR Interval:    QRS Duration: 108 QT Interval:  477 QTC Calculation: 408 R Axis:   -38 Text Interpretation: Sinus bradycardia Ventricular premature complex Left axis deviation Low voltage, extremity leads 12 Lead; Mason-Likar Confirmed by Madalyn Rob [phone]) on 01/25/2020 7:47:13 PM   Radiology DG Hip Unilat W or Wo Pelvis 2-3 Views Left  Result Date: 01/25/2020 CLINICAL DATA:  Left-sided hip Shaw EXAM: DG HIP (WITH OR WITHOUT PELVIS) 2-3V LEFT COMPARISON:  None. FINDINGS: SI joints are patent. Pubic symphysis and rami are intact. No fracture or malalignment. Joint spaces are relatively preserved. IMPRESSION: No acute osseous  abnormality Electronically Signed   By: Donavan Foil M.D.   On: 01/25/2020 22:16   VAS [place] LOWER EXTREMITY VENOUS (DVT) ([facility] and [facility] 7a-7p)  Result Date: 01/25/2020  Lower Venous DVTStudy Indications: Shaw, and Shaw.  Comparison Study: No prior study Performing Technologist: Maudry Mayhew MHA, RDMS, RVT, RDCS  Examination Guidelines: A complete evaluation includes B-mode imaging, spectral Doppler, color Doppler, and power Doppler as needed of all accessible portions of each vessel. Bilateral testing is considered an integral part of a complete examination. Limited examinations for reoccurring indications may be performed as noted. The reflux portion of the exam is performed with the patient in reverse Trendelenburg.  +---------+---------------+---------+-----------+----------+--------------+ RIGHT    CompressibilityPhasicitySpontaneityPropertiesThrombus Aging +---------+---------------+---------+-----------+----------+--------------+ CFV      Full           Yes      Yes                                 +---------+---------------+---------+-----------+----------+--------------+ FV Prox  Full                                                        +---------+---------------+---------+-----------+----------+--------------+ FV Mid   Full                                                        +---------+---------------+---------+-----------+----------+--------------+ FV DistalFull                                                        +---------+---------------+---------+-----------+----------+--------------+ PFV      Full                                                        +---------+---------------+---------+-----------+----------+--------------+  POP      Full           Yes      Yes                                 +---------+---------------+---------+-----------+----------+--------------+ PTV      Full                                                         +---------+---------------+---------+-----------+----------+--------------+ PERO     Full                                                        +---------+---------------+---------+-----------+----------+--------------+   Right Technical Findings: Not visualized segments include SFJ.  +---------+---------------+---------+-----------+----------+--------------+ LEFT     CompressibilityPhasicitySpontaneityPropertiesThrombus Aging +---------+---------------+---------+-----------+----------+--------------+ CFV      Full           Yes      Yes                                 +---------+---------------+---------+-----------+----------+--------------+ SFJ      Full                                                        +---------+---------------+---------+-----------+----------+--------------+ FV Prox  Full                                                        +---------+---------------+---------+-----------+----------+--------------+ FV Mid   Full                                                        +---------+---------------+---------+-----------+----------+--------------+ FV DistalFull                                                        +---------+---------------+---------+-----------+----------+--------------+ PFV      Full                                                        +---------+---------------+---------+-----------+----------+--------------+ POP      Full           Yes      Yes                                 +---------+---------------+---------+-----------+----------+--------------+  PTV      Full                                                        +---------+---------------+---------+-----------+----------+--------------+ PERO     Full                                                        +---------+---------------+---------+-----------+----------+--------------+     Summary: RIGHT: - There is no evidence of deep vein  thrombosis in the lower extremity. However, portions of this examination were limited- see technologist comments above.  - No cystic structure found in the popliteal fossa.  LEFT: - There is no evidence of deep vein thrombosis in the lower extremity.  - No cystic structure found in the popliteal fossa.  *See table(s) above for measurements and observations.    Preliminary     Procedures Procedures (including critical care time)  Medications Ordered in ED Medications - No data to display  ED Course  I have reviewed the triage vital signs and the nursing notes.  Pertinent labs & imaging results that were available during my care of the patient were reviewed by me and considered in my medical decision making (see chart for details).    MDM Rules/Calculators/A&P                          79 y/o lady presented to ER with concern for leg Shaw. On exam noted mild non pitting Shaw to lower legs. An UC had check a d dimer level which was reported positive.  DVT study was negative. Checked labs today. No cp/sob, no hx CAD or HF, BNP was wnl, doubt heart failure as cause. DVT study was negative. Checked d dimer to compare to prior, it was noted to be elevated, similar level to a few years ago when patient had a negative CT PE study. Given she has no symptoms besides leg Shaw to raise concern for PE (with negative DVT US), no hypoxia, no tachypnea, no tachycardia, do not feel she needs CT PE study today. Regarding her left hip Shaw, xray negative, normal ROM, no fever or joint Shaw, ambulatory. Do not feel this warrants further investigation today, would recommend f/u with ortho.  I discussed findings in detail with patient and daughter, recommended f/u with PCP re leg Shaw, compression socks and elevation.      After the discussed management above, the patient was determined to be safe for discharge.  The patient was in agreement with this plan and all questions regarding their care were  answered.  ED return precautions were discussed and the patient will return to the ED with any significant worsening of condition.   Final Clinical Impression(s) / ED Diagnoses Final diagnoses:  Left hip Shaw  Leg Shaw    Rx / DC Orders ED Discharge Orders    None       Lucrezia Starch, MD 01/26/20 1514

## 2020-02-01 ENCOUNTER — Other Ambulatory Visit: Payer: Self-pay

## 2020-02-01 ENCOUNTER — Telehealth (INDEPENDENT_AMBULATORY_CARE_PROVIDER_SITE_OTHER): Payer: Medicare Other | Admitting: Nurse Practitioner

## 2020-02-01 ENCOUNTER — Encounter: Payer: Self-pay | Admitting: Nurse Practitioner

## 2020-02-01 VITALS — Ht 65.0 in | Wt 210.0 lb

## 2020-02-01 DIAGNOSIS — M5137 Other intervertebral disc degeneration, lumbosacral region: Secondary | ICD-10-CM

## 2020-02-01 DIAGNOSIS — M5416 Radiculopathy, lumbar region: Secondary | ICD-10-CM | POA: Diagnosis not present

## 2020-02-01 DIAGNOSIS — G8929 Other chronic pain: Secondary | ICD-10-CM

## 2020-02-01 MED ORDER — HYDROCODONE-ACETAMINOPHEN 7.5-325 MG PO TABS: 1.0000 | ORAL_TABLET | Freq: Three times a day (TID) | ORAL | 0 refills | Status: DC | PRN

## 2020-02-01 NOTE — Patient Instructions (Signed)
Continue hydrocodone Resume gabapentin. Schedule appt with orthopedist.

## 2020-02-01 NOTE — Progress Notes (Signed)
Virtual Visit via Video Note  I connected with@ on 02/01/20 at  1:30 PM EDT by a video enabled telemedicine application and verified that I am speaking with the correct person using two identifiers.  Location: Patient:Home Provider: Office Participants: patient and provider   I discussed the limitations of evaluation and management by telemedicine and the availability of in person appointments. I also discussed with the patient that there may be a patient responsible charge related to this service. The patient expressed understanding and agreed to proceed.  CC:Pt was seen in ER for left hip pain and possible blood clots//pt states her left hip is still hurting an her left leg as well//left leg is not as swollen but still achles she reported no blood clots//she states the pain is burning and hurts constantly in left hip//pt mentioned refills on pain she can't get till 7/16 but worried cause your out if she will get them//also going to ask about Lasix an taking it.  History of Present Illness: Left hip pain and lumber radiculopathy: Acute on chronic pain, worsening for last 2weeks, she was evaluated by ED provided on 01/25/2020 (no acute finding), I reviewed lab results and venous doppler results. LE edema has improved to leg elevation. Did not take furosemide. She discontinue gabapentin 4days with the impression that it was causing increased urinary frequency (chronic, hx of Insterstitial cystitis, managed by urology), urinary symptoms did not change with discontinuation of gabapentin. She denies any fall or hip injury, nor new symptoms since ED visit. She is unable to tolerate NSAIDs and oral prednisone.   Observations/Objective: Physical Exam Constitutional:      Appearance: She is obese.  Neurological:     Mental Status: She is alert and oriented to person, place, and time.    Assessment and Plan: Riannon was seen today for hospitalization follow-up.  Diagnoses and all orders for this  visit:  Lumbar radiculopathy, chronic -     HYDROcodone-acetaminophen (NORCO) 7.5-325 MG tablet; Take 1 tablet by mouth every 8 (eight) hours as needed for moderate pain (chronic back pain).  Other chronic pain -     HYDROcodone-acetaminophen (NORCO) 7.5-325 MG tablet; Take 1 tablet by mouth every 8 (eight) hours as needed for moderate pain (chronic back pain).  DDD (degenerative disc disease), lumbosacral -     HYDROcodone-acetaminophen (NORCO) 7.5-325 MG tablet; Take 1 tablet by mouth every 8 (eight) hours as needed for moderate pain (chronic back pain).   Follow Up Instructions: Continue hydrocodone Resume gabapentin. Schedule appt with orthopedist.   I discussed the assessment and treatment plan with the patient. The patient was provided an opportunity to ask questions and all were answered. The patient agreed with the plan and demonstrated an understanding of the instructions.   The patient was advised to call back or seek an in-person evaluation if the symptoms worsen or if the condition fails to improve as anticipated.  Wilfred Lacy, NP

## 2020-02-08 MED FILL — HYDROCODON-APAP 7.5-325: 7.5-325 | 30 days supply | Qty: 90 | Fill #0

## 2020-03-03 ENCOUNTER — Encounter: Payer: Self-pay | Admitting: Nurse Practitioner

## 2020-03-03 ENCOUNTER — Other Ambulatory Visit: Payer: Self-pay

## 2020-03-03 ENCOUNTER — Ambulatory Visit (INDEPENDENT_AMBULATORY_CARE_PROVIDER_SITE_OTHER): Payer: Medicare Other | Admitting: Nurse Practitioner

## 2020-03-03 ENCOUNTER — Other Ambulatory Visit: Payer: Self-pay | Admitting: Nurse Practitioner

## 2020-03-03 VITALS — BP 138/80 | HR 85 | Temp 98.7°F | Ht 65.0 in | Wt 215.4 lb

## 2020-03-03 DIAGNOSIS — I10 Essential (primary) hypertension: Secondary | ICD-10-CM

## 2020-03-03 DIAGNOSIS — I471 Supraventricular tachycardia, unspecified: Secondary | ICD-10-CM

## 2020-03-03 DIAGNOSIS — M5416 Radiculopathy, lumbar region: Secondary | ICD-10-CM

## 2020-03-03 DIAGNOSIS — M5137 Other intervertebral disc degeneration, lumbosacral region: Secondary | ICD-10-CM

## 2020-03-03 DIAGNOSIS — F1120 Opioid dependence, uncomplicated: Secondary | ICD-10-CM | POA: Diagnosis not present

## 2020-03-03 DIAGNOSIS — G8929 Other chronic pain: Secondary | ICD-10-CM

## 2020-03-03 DIAGNOSIS — M51379 Other intervertebral disc degeneration, lumbosacral region without mention of lumbar back pain or lower extremity pain: Secondary | ICD-10-CM

## 2020-03-03 MED ORDER — HYDROCODONE-ACETAMINOPHEN 7.5-325 MG PO TABS: 1.0000 | ORAL_TABLET | Freq: Three times a day (TID) | ORAL | 0 refills | Status: AC | PRN

## 2020-03-03 MED ORDER — HYDROCHLOROTHIAZIDE 25 MG PO TABS: 25.0000 mg | ORAL_TABLET | Freq: Every day | ORAL | 1 refills | Status: DC

## 2020-03-03 MED ORDER — GABAPENTIN 100 MG PO CAPS: 100.0000 mg | ORAL_CAPSULE | Freq: Every day | ORAL | 3 refills | Status: DC

## 2020-03-03 MED ORDER — AMLODIPINE BESYLATE 10 MG PO TABS: 10.0000 mg | ORAL_TABLET | Freq: Every day | ORAL | 3 refills | Status: DC

## 2020-03-03 MED ORDER — ATENOLOL 25 MG PO TABS: 25.0000 mg | ORAL_TABLET | Freq: Every day | ORAL | 3 refills | Status: DC

## 2020-03-03 MED FILL — AMLODIPINE BESYLATE 10 MG T: 10 | 90 days supply | Qty: 90 | Fill #0

## 2020-03-03 NOTE — Progress Notes (Signed)
Subjective:  Patient ID: Heather Shaw, female    DOB: 02/19/1941  Age: 79 y.o. MRN: [id number]  CC: Follow-up (1 month for chronic pain and HTN-pt still having pain and BP around 140/85-pt wants pain meds sent to [facility])  HPI  Chronic pain Improved pain, sleep and mobility with hydrocodone and gabapentin. Use of gabapentin 100mg  BID. denies any daytime somnolence or falls or dizziness. Improved constipation with miralax PMP database reviewed: Hydrocodone last filled 02/08/2020, #90, no red flags She requested for pharmacy to [facility] outpatient due to familiarity with staff.  Maintain current medications Repeat UDS today Refill sent F/up in 72months  Essential hypertension BP at goal with amlodipine and HCTZ Stable renal function and electrolytes BP Readings from Last 3 Encounters:  03/03/20 138/80  01/25/20 (!) 172/64  01/16/20 118/68   Maintain current medications  SVT (supraventricular tachycardia) (HCC) Stable rate with atenolol   Reviewed past Medical, Social and Family history today.  Outpatient Medications Prior to Visit  Medication Sig Dispense Refill  . Fluocinonide Emulsified Base 0.05 % CREA APPLY 1 APPLICATION TOPICALLY 2 (TWO) TIMES DAILY. 30 g 0  . hydrOXYzine (ATARAX/VISTARIL) 10 MG tablet Take 10 mg by mouth at bedtime.     . magnesium hydroxide (MILK OF MAGNESIA) 400 MG/5ML suspension Take by mouth daily as needed for mild constipation.    . meclizine (ANTIVERT) 12.5 MG tablet Take 1 tablet (12.5 mg total) by mouth 3 (three) times daily as needed for dizziness. 30 tablet 0  . Meth-Hyo-M Bl-Na Phos-Ph Sal (URO-MP) 118 MG CAPS Take 1 capsule by mouth 3 (three) times daily as needed.   3  . polyethylene glycol powder (GLYCOLAX/MIRALAX) powder Dissolve 17 grams in at least 8 ounces water/juice and drink once daily 527 g 3  . amLODipine (NORVASC) 10 MG tablet Take 1 tablet (10 mg total) by mouth daily. 90 tablet 3  . atenolol (TENORMIN) 25  MG tablet Take 1 tablet (25 mg total) by mouth daily. 90 tablet 3  . gabapentin (NEURONTIN) 100 MG capsule Take 1-2 capsules (100-200 mg total) by mouth at bedtime. 60 capsule 5  . hydrochlorothiazide (HYDRODIURIL) 25 MG tablet Take 1 tablet (25 mg total) by mouth daily. 90 tablet 1  . HYDROcodone-acetaminophen (NORCO) 7.5-325 MG tablet Take 1 tablet by mouth every 8 (eight) hours as needed for moderate pain (chronic back pain). 90 tablet 0   No facility-administered medications prior to visit.    ROS See HPI  Objective:  BP 138/80   Pulse 85   Temp 98.7 F (37.1 C) (Tympanic)   Ht 5\' 5"  (1.651 m)   Wt 215 lb 6.4 oz (97.7 kg)   SpO2 98%   BMI 35.84 kg/m   Wt Readings from Last 3 Encounters:  03/03/20 215 lb 6.4 oz (97.7 kg)  02/01/20 210 lb (95.3 kg)  01/16/20 211 lb (95.7 kg)   Physical Exam Vitals reviewed.  Constitutional:      Appearance: She is obese.  Cardiovascular:     Rate and Rhythm: Normal rate and regular rhythm.     Pulses: Normal pulses.     Heart sounds: Normal heart sounds.  Pulmonary:     Effort: Pulmonary effort is normal.     Breath sounds: Normal breath sounds.  Musculoskeletal:     Right lower leg: No edema.     Left lower leg: No edema.  Skin:    General: Skin is warm and dry.  Neurological:  Mental Status: She is alert and oriented to person, place, and time.    Assessment & Plan:  This visit occurred during the SARS-CoV-2 public health emergency.  Safety protocols were in place, including screening questions prior to the visit, additional usage of staff PPE, and extensive cleaning of exam room while observing appropriate contact time as indicated for disinfecting solutions.   Pegi was seen today for follow-up.  Diagnoses and all orders for this visit:  Other chronic pain -     ToxAssure Select,+Antidepr,UR -     HYDROcodone-acetaminophen (NORCO) 7.5-325 MG tablet; Take 1 tablet by mouth every 8 (eight) hours as needed for moderate  pain (chronic back pain). -     gabapentin (NEURONTIN) 100 MG capsule; Take 1-2 capsules (100-200 mg total) by mouth at bedtime.  Opioid type dependence, continuous (HCC) -     ToxAssure Select,+Antidepr,UR  Essential hypertension -     hydrochlorothiazide (HYDRODIURIL) 25 MG tablet; Take 1 tablet (25 mg total) by mouth daily. -     atenolol (TENORMIN) 25 MG tablet; Take 1 tablet (25 mg total) by mouth daily. -     amLODipine (NORVASC) 10 MG tablet; Take 1 tablet (10 mg total) by mouth daily.  Lumbar radiculopathy, chronic -     HYDROcodone-acetaminophen (NORCO) 7.5-325 MG tablet; Take 1 tablet by mouth every 8 (eight) hours as needed for moderate pain (chronic back pain).  DDD (degenerative disc disease), lumbosacral -     HYDROcodone-acetaminophen (NORCO) 7.5-325 MG tablet; Take 1 tablet by mouth every 8 (eight) hours as needed for moderate pain (chronic back pain). -     gabapentin (NEURONTIN) 100 MG capsule; Take 1-2 capsules (100-200 mg total) by mouth at bedtime.  SVT (supraventricular tachycardia) (HCC) -     atenolol (TENORMIN) 25 MG tablet; Take 1 tablet (25 mg total) by mouth daily.    Problem List Items Addressed This Visit      Cardiovascular and Mediastinum   Essential hypertension    BP at goal with amlodipine and HCTZ Stable renal function and electrolytes BP Readings from Last 3 Encounters:  03/03/20 138/80  01/25/20 (!) 172/64  01/16/20 118/68   Maintain current medications      Relevant Medications   hydrochlorothiazide (HYDRODIURIL) 25 MG tablet   atenolol (TENORMIN) 25 MG tablet   amLODipine (NORVASC) 10 MG tablet   SVT (supraventricular tachycardia) (HCC)    Stable rate with atenolol      Relevant Medications   hydrochlorothiazide (HYDRODIURIL) 25 MG tablet   atenolol (TENORMIN) 25 MG tablet   amLODipine (NORVASC) 10 MG tablet     Nervous and Auditory   Lumbar radiculopathy, chronic   Relevant Medications   HYDROcodone-acetaminophen (NORCO)  7.5-325 MG tablet   gabapentin (NEURONTIN) 100 MG capsule     Musculoskeletal and Integument   DDD (degenerative disc disease), lumbosacral   Relevant Medications   HYDROcodone-acetaminophen (NORCO) 7.5-325 MG tablet   gabapentin (NEURONTIN) 100 MG capsule     Other   Chronic pain - Primary    Improved pain, sleep and mobility with hydrocodone and gabapentin. Use of gabapentin 100mg  BID. denies any daytime somnolence or falls or dizziness. Improved constipation with miralax PMP database reviewed: Hydrocodone last filled 02/08/2020, #90, no red flags She requested for pharmacy to [facility] outpatient due to familiarity with staff.  Maintain current medications Repeat UDS today Refill sent F/up in 30months      Relevant Medications   HYDROcodone-acetaminophen (NORCO) 7.5-325 MG tablet   gabapentin (  NEURONTIN) 100 MG capsule   Other Relevant Orders   ToxAssure Select,+Antidepr,UR   Opioid type dependence, continuous ([place])   Relevant Orders   ToxAssure Select,+Antidepr,UR      Follow-up: Return in about 3 months (around 06/03/2020) for HTN, chronic pain management,( F2F, 28mins, fasting).  Wilfred Lacy, NP

## 2020-03-03 NOTE — Patient Instructions (Signed)
Go to lab for urine collection.  Maintain current prescriptions. Sent all prescriptions to [facility] pharmacy as discussed.

## 2020-03-03 NOTE — Assessment & Plan Note (Signed)
BP at goal with amlodipine and HCTZ Stable renal function and electrolytes BP Readings from Last 3 Encounters:  03/03/20 138/80  01/25/20 (!) 172/64  01/16/20 118/68   Maintain current medications

## 2020-03-03 NOTE — Assessment & Plan Note (Signed)
Stable rate with atenolol

## 2020-03-03 NOTE — Assessment & Plan Note (Signed)
Improved pain, sleep and mobility with hydrocodone and gabapentin. Use of gabapentin 100mg  BID. denies any daytime somnolence or falls or dizziness. Improved constipation with miralax PMP database reviewed: Hydrocodone last filled 02/08/2020, #90, no red flags She requested for pharmacy to [facility] outpatient due to familiarity with staff.  Maintain current medications Repeat UDS today Refill sent F/up in 49months

## 2020-03-05 LAB — TOXASSURE SELECT,+ANTIDEPR,UR

## 2020-03-10 MED FILL — HYDROCODON-APAP 7.5-325: 7.5-325 | 30 days supply | Qty: 90 | Fill #0

## 2020-04-07 ENCOUNTER — Telehealth: Payer: Self-pay | Admitting: Nurse Practitioner

## 2020-04-07 DIAGNOSIS — M5137 Other intervertebral disc degeneration, lumbosacral region: Secondary | ICD-10-CM

## 2020-04-07 DIAGNOSIS — M5416 Radiculopathy, lumbar region: Secondary | ICD-10-CM

## 2020-04-07 DIAGNOSIS — F1120 Opioid dependence, uncomplicated: Secondary | ICD-10-CM

## 2020-04-07 MED ORDER — HYDROCODONE-ACETAMINOPHEN 7.5-325 MG PO TABS: 1.0000 | ORAL_TABLET | Freq: Three times a day (TID) | ORAL | 0 refills | Status: DC

## 2020-04-07 MED FILL — HYDROCODON-APAP 7.5-325: 7.5-325 | 30 days supply | Qty: 90 | Fill #0

## 2020-04-07 NOTE — Telephone Encounter (Signed)
Patient is calling and requesting a refill for hydrocodone send to [facility], please advise. CB is [phone]

## 2020-04-07 NOTE — Telephone Encounter (Signed)
Rx sent 

## 2020-04-07 NOTE — Telephone Encounter (Signed)
Please advise 

## 2020-04-18 ENCOUNTER — Other Ambulatory Visit: Payer: Self-pay | Admitting: Nurse Practitioner

## 2020-04-18 MED ORDER — MECLIZINE HCL 25 MG PO TABS: 12.5000 mg | ORAL_TABLET | Freq: Two times a day (BID) | ORAL | 0 refills | Status: DC | PRN

## 2020-04-18 MED FILL — MECLIZINE 25 MG TABLET: 25 | 30 days supply | Qty: 30 | Fill #0

## 2020-04-18 NOTE — Telephone Encounter (Signed)
Patient would like refill of meclizine and requesting change to 25 mg. Please send to same [facility]  In [place], [place].

## 2020-04-18 NOTE — Telephone Encounter (Signed)
Please advise 

## 2020-05-06 ENCOUNTER — Other Ambulatory Visit: Payer: Self-pay | Admitting: Nurse Practitioner

## 2020-05-06 DIAGNOSIS — M5137 Other intervertebral disc degeneration, lumbosacral region: Secondary | ICD-10-CM

## 2020-05-06 DIAGNOSIS — M5416 Radiculopathy, lumbar region: Secondary | ICD-10-CM

## 2020-05-06 DIAGNOSIS — F1120 Opioid dependence, uncomplicated: Secondary | ICD-10-CM

## 2020-05-06 NOTE — Telephone Encounter (Signed)
Patient is calling and requesting a refill meclizine and hydrocodone sent to [facility], please advise. CB is [phone]

## 2020-05-06 NOTE — Telephone Encounter (Signed)
Please advise 

## 2020-05-08 ENCOUNTER — Other Ambulatory Visit: Payer: Self-pay | Admitting: Nurse Practitioner

## 2020-05-08 MED ORDER — HYDROCODONE-ACETAMINOPHEN 7.5-325 MG PO TABS: 1.0000 | ORAL_TABLET | Freq: Three times a day (TID) | ORAL | 0 refills | Status: DC

## 2020-05-08 MED FILL — HYDROCODON-APAP 7.5-325: 7.5-325 | 30 days supply | Qty: 90 | Fill #0

## 2020-05-08 MED FILL — MECLIZINE 25 MG TABLET: 25 | 30 days supply | Qty: 30 | Fill #0

## 2020-05-08 NOTE — Telephone Encounter (Signed)
Last 3 refills: 02/08/2020, 03/10/2020, 04/07/2020, #90tabs each No red flags Up to date with UDS and contract. Due to appt in November

## 2020-06-06 ENCOUNTER — Telehealth (INDEPENDENT_AMBULATORY_CARE_PROVIDER_SITE_OTHER): Payer: Medicare Other | Admitting: Nurse Practitioner

## 2020-06-06 ENCOUNTER — Other Ambulatory Visit: Payer: Self-pay | Admitting: Nurse Practitioner

## 2020-06-06 ENCOUNTER — Encounter: Payer: Self-pay | Admitting: Nurse Practitioner

## 2020-06-06 DIAGNOSIS — M5137 Other intervertebral disc degeneration, lumbosacral region: Secondary | ICD-10-CM

## 2020-06-06 DIAGNOSIS — M5416 Radiculopathy, lumbar region: Secondary | ICD-10-CM

## 2020-06-06 DIAGNOSIS — F1120 Opioid dependence, uncomplicated: Secondary | ICD-10-CM

## 2020-06-06 DIAGNOSIS — G8929 Other chronic pain: Secondary | ICD-10-CM | POA: Diagnosis not present

## 2020-06-06 MED ORDER — GABAPENTIN 300 MG PO CAPS: 300.0000 mg | ORAL_CAPSULE | Freq: Two times a day (BID) | ORAL | 5 refills | Status: DC

## 2020-06-06 MED ORDER — HYDROCODONE-ACETAMINOPHEN 7.5-325 MG PO TABS: 1.0000 | ORAL_TABLET | Freq: Three times a day (TID) | ORAL | 0 refills | Status: DC

## 2020-06-06 MED FILL — AMLODIPINE BESYLATE 10 MG T: 10 | 90 days supply | Qty: 90 | Fill #1

## 2020-06-06 NOTE — Progress Notes (Signed)
Virtual Visit via Video Note  I connected with@ on 06/06/20 at  1:30 PM EST by a video enabled telemedicine application and verified that I am speaking with the correct person using two identifiers.  Location: Patient:Home Provider: Office Participants: patient and provider  I discussed the limitations of evaluation and management by telemedicine and the availability of in person appointments. I also discussed with the patient that there may be a patient responsible charge related to this service. The patient expressed understanding and agreed to proceed.  HT:XHFS management.  History of Present Illness: Chronic pain Persistent restlessness at hs due to back pain. No change with use of gabapentin 200mg  at hs PMP database reviewed: Norco last filled 05/08/2020, #90 Denies any LE edema or dizzness or daytime somnolence.  Maintain norco dose Increase gabapentin to 300mg  BID, advised about risk of sedation and dizziness with increased dose. F/up in 44months or sooner if needed    BP Readings from Last 3 Encounters:  06/06/20 (!) 141/81  03/03/20 138/80  01/25/20 (!) 172/64   Observations/Objective: Physical Exam Constitutional:      General: She is not in acute distress.    Appearance: She is obese.  Cardiovascular:     Pulses: Normal pulses.  Pulmonary:     Effort: Pulmonary effort is normal.  Neurological:     Mental Status: She is alert and oriented to person, place, and time.  Psychiatric:        Mood and Affect: Mood normal.        Behavior: Behavior normal.        Thought Content: Thought content normal.    Assessment and Plan: Anslie was seen today for follow-up.  Diagnoses and all orders for this visit:  Other chronic pain -     gabapentin (NEURONTIN) 300 MG capsule; Take 1 capsule (300 mg total) by mouth 2 (two) times daily.  DDD (degenerative disc disease), lumbosacral -     gabapentin (NEURONTIN) 300 MG capsule; Take 1 capsule (300 mg total) by mouth 2  (two) times daily. -     HYDROcodone-acetaminophen (NORCO) 7.5-325 MG tablet; Take 1 tablet by mouth every 8 (eight) hours. For chronic back pain management  Lumbar radiculopathy, chronic -     HYDROcodone-acetaminophen (NORCO) 7.5-325 MG tablet; Take 1 tablet by mouth every 8 (eight) hours. For chronic back pain management  Opioid type dependence, continuous (HCC) -     HYDROcodone-acetaminophen (NORCO) 7.5-325 MG tablet; Take 1 tablet by mouth every 8 (eight) hours. For chronic back pain management   Follow Up Instructions: F/up in 69months   I discussed the assessment and treatment plan with the patient. The patient was provided an opportunity to ask questions and all were answered. The patient agreed with the plan and demonstrated an understanding of the instructions.   The patient was advised to call back or seek an in-person evaluation if the symptoms worsen or if the condition fails to improve as anticipated.  Wilfred Lacy, NP

## 2020-06-06 NOTE — Assessment & Plan Note (Addendum)
Persistent restlessness at hs due to back pain. No change with use of gabapentin 200mg  at hs PMP database reviewed: Norco last filled 05/08/2020, #90 Denies any LE edema or dizzness or daytime somnolence.  Maintain norco dose Increase gabapentin to 300mg  BID, advised about risk of sedation and dizziness with increased dose. F/up in 51months or sooner if needed

## 2020-06-08 MED FILL — HYDROCODON-APAP 7.5-325: 7.5-325 | 30 days supply | Qty: 90 | Fill #0

## 2020-07-07 ENCOUNTER — Other Ambulatory Visit: Payer: Self-pay | Admitting: Nurse Practitioner

## 2020-07-07 DIAGNOSIS — F1120 Opioid dependence, uncomplicated: Secondary | ICD-10-CM

## 2020-07-07 DIAGNOSIS — M5137 Other intervertebral disc degeneration, lumbosacral region: Secondary | ICD-10-CM

## 2020-07-07 DIAGNOSIS — M5416 Radiculopathy, lumbar region: Secondary | ICD-10-CM

## 2020-07-07 MED ORDER — HYDROCODONE-ACETAMINOPHEN 7.5-325 MG PO TABS: 1.0000 | ORAL_TABLET | Freq: Three times a day (TID) | ORAL | 0 refills | Status: DC

## 2020-07-07 MED FILL — HYDROCODON-APAP 7.5-325: 7.5-325 | 30 days supply | Qty: 90 | Fill #0

## 2020-07-07 NOTE — Telephone Encounter (Signed)
Inquire from pharmacy if 06/08/2020 was filled?

## 2020-07-07 NOTE — Telephone Encounter (Signed)
Pharmacy confirms Rx filled on 06/08/20

## 2020-07-07 NOTE — Telephone Encounter (Signed)
Patient is calling to get a refill on her Hydrocodone 7.5mg . If approved, please send to [place] and call patient at [phone] to let her know that it has been sent in.

## 2020-07-07 NOTE — Telephone Encounter (Signed)
Refill request for Hydrocodone 7.5  Last 3 dates filled per PMP  05/08/20 04/07/20 03/10/20  QTY 90

## 2020-07-08 DIAGNOSIS — Z23 Encounter for immunization: Secondary | ICD-10-CM | POA: Diagnosis not present

## 2020-07-14 ENCOUNTER — Other Ambulatory Visit: Payer: Self-pay | Admitting: Nurse Practitioner

## 2020-07-14 ENCOUNTER — Telehealth: Payer: Self-pay

## 2020-07-14 DIAGNOSIS — I10 Essential (primary) hypertension: Secondary | ICD-10-CM

## 2020-07-14 MED ORDER — HYDROCHLOROTHIAZIDE 25 MG PO TABS: 25.0000 mg | ORAL_TABLET | Freq: Every day | ORAL | 1 refills | Status: DC

## 2020-07-14 MED FILL — HYDROCHLOROTHIAZIDE 25 MG T: 25 | 90 days supply | Qty: 90 | Fill #0

## 2020-07-14 NOTE — Telephone Encounter (Signed)
Last VV 06/06/20 Last fill 03/03/20  #90/1

## 2020-08-04 ENCOUNTER — Other Ambulatory Visit: Payer: Self-pay | Admitting: Nurse Practitioner

## 2020-08-04 DIAGNOSIS — M5137 Other intervertebral disc degeneration, lumbosacral region: Secondary | ICD-10-CM

## 2020-08-04 DIAGNOSIS — M5416 Radiculopathy, lumbar region: Secondary | ICD-10-CM

## 2020-08-04 DIAGNOSIS — F1120 Opioid dependence, uncomplicated: Secondary | ICD-10-CM

## 2020-08-04 NOTE — Telephone Encounter (Signed)
Pt requesting refill on HYDROcodone-acetaminophen (NORCO) 7.5-325 MG tablet

## 2020-08-06 NOTE — Telephone Encounter (Signed)
Patient is calling to check the status of her refill. Please give her call back and update.

## 2020-08-07 ENCOUNTER — Other Ambulatory Visit: Payer: Self-pay | Admitting: Nurse Practitioner

## 2020-08-07 MED ORDER — HYDROCODONE-ACETAMINOPHEN 7.5-325 MG PO TABS: 1.0000 | ORAL_TABLET | Freq: Three times a day (TID) | ORAL | 0 refills | Status: DC

## 2020-08-07 MED FILL — HYDROCODON-APAP 7.5-325: 7.5-325 | 30 days supply | Qty: 90 | Fill #0

## 2020-08-07 MED FILL — HYDROCHLOROTHIAZIDE 25 MG T: 25 | 90 days supply | Qty: 90 | Fill #0

## 2020-08-07 MED FILL — GABAPENTIN 300 MG CAPSULE: 300 | 30 days supply | Qty: 60 | Fill #0

## 2020-08-07 NOTE — Telephone Encounter (Signed)
Pt calling for update on this. She said she took her last one last night. Please advise

## 2020-08-07 NOTE — Telephone Encounter (Signed)
PMP data shows last 3 refills  07/07/20 05/08/20 04/07/20  QTY 90 filled at [facility]

## 2020-08-07 NOTE — Telephone Encounter (Signed)
Patient notified and verbalized understanding Appt scheduled for 09/10/20

## 2020-08-07 NOTE — Telephone Encounter (Signed)
See below

## 2020-08-25 ENCOUNTER — Other Ambulatory Visit: Payer: Self-pay

## 2020-08-26 ENCOUNTER — Ambulatory Visit (INDEPENDENT_AMBULATORY_CARE_PROVIDER_SITE_OTHER): Payer: Medicare Other | Admitting: Nurse Practitioner

## 2020-08-26 ENCOUNTER — Encounter: Payer: Self-pay | Admitting: Nurse Practitioner

## 2020-08-26 ENCOUNTER — Other Ambulatory Visit: Payer: Self-pay | Admitting: Nurse Practitioner

## 2020-08-26 VITALS — BP 130/62 | HR 60 | Temp 96.9°F | Ht 65.0 in | Wt 212.0 lb

## 2020-08-26 DIAGNOSIS — S93492A Sprain of other ligament of left ankle, initial encounter: Secondary | ICD-10-CM | POA: Diagnosis not present

## 2020-08-26 MED ORDER — DICLOFENAC SODIUM 1 % EX GEL: 2.0000 g | Freq: Four times a day (QID) | CUTANEOUS | 0 refills | Status: DC

## 2020-08-26 MED FILL — DICLOFENAC SODIUM 1 % GEL: 1 | 15 days supply | Qty: 100 | Fill #0

## 2020-08-26 NOTE — Progress Notes (Signed)
Subjective:  Patient ID: Heather Shaw, female    DOB: [birth year]  Age: 80 y.o. MRN: [id number]  CC: Acute Visit (Pt c/o left foot pain x 3 days. Pt states she was walking in her yard Sunday and her right foot turned and it has been painful since. Pt denies stepping in a hole or anything that she can recall. )  Foot Injury  The incident occurred 2 days ago. The incident occurred at home. The injury mechanism was a twisting injury. The pain is present in the left ankle. The quality of the pain is described as aching. The pain has been constant since onset. Associated symptoms include an inability to bear weight. Pertinent negatives include no loss of motion, loss of sensation, muscle weakness, numbness or tingling. She reports no foreign bodies present. The symptoms are aggravated by weight bearing. She has tried acetaminophen and elevation for the symptoms. The treatment provided mild relief.   Reviewed past Medical, Social and Family history today.  Outpatient Medications Prior to Visit  Medication Sig Dispense Refill  . amLODipine (NORVASC) 10 MG tablet Take 1 tablet (10 mg total) by mouth daily. 90 tablet 3  . atenolol (TENORMIN) 25 MG tablet Take 1 tablet (25 mg total) by mouth daily. 90 tablet 3  . Fluocinonide Emulsified Base 0.05 % CREA APPLY 1 APPLICATION TOPICALLY 2 (TWO) TIMES DAILY. 30 g 0  . gabapentin (NEURONTIN) 300 MG capsule Take 1 capsule (300 mg total) by mouth 2 (two) times daily. 60 capsule 5  . hydrochlorothiazide (HYDRODIURIL) 25 MG tablet Take 1 tablet (25 mg total) by mouth daily. 90 tablet 1  . HYDROcodone-acetaminophen (NORCO) 7.5-325 MG tablet Take 1 tablet by mouth every 8 (eight) hours. For chronic back pain management 90 tablet 0  . meclizine (ANTIVERT) 25 MG tablet Take 0.5 tablets (12.5 mg total) by mouth 2 (two) times daily as needed for dizziness. 30 tablet 0   No facility-administered medications prior to visit.    ROS See HPI  Objective:  BP 130/62  (BP Location: Left Arm, Patient Position: Sitting, Cuff Size: Large)   Pulse 60   Temp (!) 96.9 F (36.1 C) (Temporal)   Ht 5\' 5"  (1.651 m)   Wt 212 lb (96.2 kg)   SpO2 97%   BMI 35.28 kg/m   Physical Exam Vitals reviewed.  Constitutional:      Appearance: She is obese.  Cardiovascular:     Pulses:          Dorsalis pedis pulses are 2+ on the right side and 2+ on the left side.       Posterior tibial pulses are 2+ on the right side and 2+ on the left side.  Musculoskeletal:        General: Swelling, tenderness and signs of injury present. No deformity.     Right ankle: Normal.     Right Achilles Tendon: Normal.     Left ankle: Swelling present. No deformity or lacerations. Tenderness present over the ATF ligament. No AITF ligament, CF ligament, posterior TF ligament, base of 5th metatarsal or proximal fibula tenderness. Normal range of motion.     Left Achilles Tendon: Normal.     Right foot: Normal range of motion. No swelling, deformity, tenderness or bony tenderness.     Left foot: Normal range of motion. No swelling, deformity, tenderness or bony tenderness.       Feet:  Feet:     Right foot:     Skin integrity:  Skin integrity normal.     Left foot:     Skin integrity: Skin integrity normal.  Skin:    General: Skin is warm and dry.     Findings: No erythema.  Neurological:     Mental Status: She is alert.    Assessment & Plan:  This visit occurred during the SARS-CoV-2 public health emergency.  Safety protocols were in place, including screening questions prior to the visit, additional usage of staff PPE, and extensive cleaning of exam room while observing appropriate contact time as indicated for disinfecting solutions.   Tawana was seen today for acute visit.  Diagnoses and all orders for this visit:  Sprain of anterior talofibular ligament of left ankle, initial encounter -     diclofenac Sodium (VOLTAREN) 1 % GEL; Apply 2 g topically 4 (four) times daily.  Use  voltaren gel as prescribed Wear ankle brace during the day and off at night. When sitting, elevated foot and apply cold compress.  Problem List Items Addressed This Visit   None   Visit Diagnoses    Sprain of anterior talofibular ligament of left ankle, initial encounter    -  Primary   Relevant Medications   diclofenac Sodium (VOLTAREN) 1 % GEL      Follow-up: Return if symptoms worsen or fail to improve.  Wilfred Lacy, NP

## 2020-08-26 NOTE — Patient Instructions (Signed)
Use voltaren gel as prescribed Wear ankle brace during the day and off at night. When sitting, elevated foot and apply cold compress.  Ankle Sprain  An ankle sprain is a stretch or tear in one of the tough tissues (ligaments) that connect the bones in your ankle. An ankle sprain can happen when the ankle rolls outward (inversion sprain) or inward (eversion sprain). What are the causes? This condition is caused by rolling or twisting the ankle. What increases the risk? You are more likely to develop this condition if you play sports. What are the signs or symptoms? Symptoms of this condition include:  Pain in your ankle.  Swelling.  Bruising. This may happen right after you sprain your ankle or 1-2 days later.  Trouble standing or walking. How is this diagnosed? This condition is diagnosed with:  A physical exam. During the exam, your doctor will press on certain parts of your foot and ankle and try to move them in certain ways.  X-ray imaging. These may be taken to see how bad the sprain is and to check for broken bones. How is this treated? This condition may be treated with:  A brace or splint. This is used to keep the ankle from moving until it heals.  An elastic bandage. This is used to support the ankle.  Crutches.  Pain medicine.  Surgery. This may be needed if the sprain is very bad.  Physical therapy. This may help to improve movement in the ankle. Follow these instructions at home: If you have a brace or a splint:  Wear the brace or splint as told by your doctor. Remove it only as told by your doctor.  Loosen the brace or splint if your toes: ? Tingle. ? Lose feeling (become numb). ? Turn cold and blue.  Keep the brace or splint clean.  If the brace or splint is not waterproof: ? Do not let it get wet. ? Cover it with a watertight covering when you take a bath or a shower. If you have an elastic bandage (dressing):  Remove it to shower or  bathe.  Try not to move your ankle much, but wiggle your toes from time to time. This helps to prevent swelling.  Adjust the dressing if it feels too tight.  Loosen the dressing if your foot: ? Loses feeling. ? Tingles. ? Becomes cold and blue. Managing pain, stiffness, and swelling  Take over-the-counter and prescription medicines only as told by doctor.  For 2-3 days, keep your ankle raised (elevated) above the level of your heart.  If told, put ice on the injured area: ? If you have a removable brace or splint, remove it as told by your doctor. ? Put ice in a plastic bag. ? Place a towel between your skin and the bag. ? Leave the ice on for 20 minutes, 2-3 times a day.   General instructions  Rest your ankle.  Do not use your injured leg to support your body weight until your doctor says that you can. Use crutches as told by your doctor.  Do not use any products that contain nicotine or tobacco, such as cigarettes, e-cigarettes, and chewing tobacco. If you need help quitting, ask your doctor.  Keep all follow-up visits as told by your doctor. Contact a doctor if:  Your bruises or swelling are quickly getting worse.  Your pain does not get better after you take medicine. Get help right away if:  You cannot feel your toes or  foot.  Your foot or toes look blue.  You have very bad pain that gets worse. Summary  An ankle sprain is a stretch or tear in one of the tough tissues (ligaments) that connect the bones in your ankle.  This condition is caused by rolling or twisting the ankle.  Symptoms include pain, swelling, bruising, and trouble walking.  To help with pain and swelling, put ice on the injured ankle, raise your ankle above the level of your heart, and use an elastic bandage. Also, rest as told by your doctor.  Keep all follow-up visits as told by your doctor. This is important. This information is not intended to replace advice given to you by your health  care provider. Make sure you discuss any questions you have with your health care provider. Document Revised: 12/06/2017 Document Reviewed: 12/06/2017 Elsevier Patient Education  [place].

## 2020-09-04 ENCOUNTER — Other Ambulatory Visit: Payer: Self-pay | Admitting: Nurse Practitioner

## 2020-09-04 DIAGNOSIS — M5137 Other intervertebral disc degeneration, lumbosacral region: Secondary | ICD-10-CM

## 2020-09-04 DIAGNOSIS — F1120 Opioid dependence, uncomplicated: Secondary | ICD-10-CM

## 2020-09-04 DIAGNOSIS — M5416 Radiculopathy, lumbar region: Secondary | ICD-10-CM

## 2020-09-04 NOTE — Telephone Encounter (Signed)
Patient is calling to get a refill on her Hydrocodone. If approved, please send to [place] and call her at [phone] to let her know that it has been sent in. She states that she's completely out.

## 2020-09-04 NOTE — Telephone Encounter (Signed)
PMP database has last refills as documented,  05/08/20 07/07/20 08/07/20  Qty 90 @ [facility]

## 2020-09-05 ENCOUNTER — Other Ambulatory Visit (HOSPITAL_COMMUNITY): Payer: Self-pay | Admitting: Nurse Practitioner

## 2020-09-05 ENCOUNTER — Other Ambulatory Visit: Payer: Self-pay

## 2020-09-05 DIAGNOSIS — M5137 Other intervertebral disc degeneration, lumbosacral region: Secondary | ICD-10-CM

## 2020-09-05 DIAGNOSIS — M5416 Radiculopathy, lumbar region: Secondary | ICD-10-CM

## 2020-09-05 DIAGNOSIS — F1120 Opioid dependence, uncomplicated: Secondary | ICD-10-CM

## 2020-09-05 MED ORDER — HYDROCODONE-ACETAMINOPHEN 7.5-325 MG PO TABS: 1.0000 | ORAL_TABLET | Freq: Three times a day (TID) | ORAL | 0 refills | Status: DC

## 2020-09-05 MED FILL — HYDROCODON-APAP 7.5-325: 7.5-325 | 30 days supply | Qty: 90 | Fill #0

## 2020-09-05 NOTE — Telephone Encounter (Signed)
Pt requesting a refill before the day is over due to pharmacy not open on the weekend.  Pt is on her last pill.  Please advise.

## 2020-09-10 ENCOUNTER — Ambulatory Visit: Payer: Medicare Other | Admitting: Nurse Practitioner

## 2020-09-22 ENCOUNTER — Encounter: Payer: Self-pay | Admitting: Nurse Practitioner

## 2020-09-22 ENCOUNTER — Ambulatory Visit (INDEPENDENT_AMBULATORY_CARE_PROVIDER_SITE_OTHER): Payer: Medicare Other | Admitting: Nurse Practitioner

## 2020-09-22 ENCOUNTER — Other Ambulatory Visit: Payer: Self-pay | Admitting: Nurse Practitioner

## 2020-09-22 ENCOUNTER — Other Ambulatory Visit: Payer: Self-pay

## 2020-09-22 VITALS — BP 126/60 | HR 60 | Temp 98.1°F | Ht 65.0 in | Wt 211.4 lb

## 2020-09-22 DIAGNOSIS — I10 Essential (primary) hypertension: Secondary | ICD-10-CM

## 2020-09-22 DIAGNOSIS — R739 Hyperglycemia, unspecified: Secondary | ICD-10-CM

## 2020-09-22 DIAGNOSIS — E782 Mixed hyperlipidemia: Secondary | ICD-10-CM

## 2020-09-22 DIAGNOSIS — G8929 Other chronic pain: Secondary | ICD-10-CM | POA: Diagnosis not present

## 2020-09-22 DIAGNOSIS — M5137 Other intervertebral disc degeneration, lumbosacral region: Secondary | ICD-10-CM

## 2020-09-22 DIAGNOSIS — I700 Atherosclerosis of aorta: Secondary | ICD-10-CM

## 2020-09-22 DIAGNOSIS — F1120 Opioid dependence, uncomplicated: Secondary | ICD-10-CM

## 2020-09-22 MED ORDER — GABAPENTIN 100 MG PO CAPS: 200.0000 mg | ORAL_CAPSULE | Freq: Every day | ORAL | 3 refills | Status: DC

## 2020-09-22 MED FILL — GABAPENTIN 100 MG CAPSULE: 100 | 90 days supply | Qty: 180 | Fill #0

## 2020-09-22 NOTE — Progress Notes (Signed)
Subjective:  Patient ID: Heather Shaw, female    DOB: [birth year]  Age: 80 y.o. MRN: [id number]  CC: Follow-up (3 month f/u on HTN and back pain.)   HPI  Chronic pain Renewed contract today Unable to tolerate 300mg  gabapentin: abd pain  Maintain hydrocodone dose Resume gabapentin 200mg  at hs F/up in 52month  Opioid type dependence, continuous ([place]) Up to date with controlled substance contract and UDS. Use of gabapentin and hydrocodone Uses total of 7.5mg  MME per day  Hypertensive disorder BP at goal with amlodipine, HCTZ and atenolol BP Readings from Last 3 Encounters:  09/22/20 126/60  08/26/20 130/62  06/06/20 (!) 141/81    HLD (hyperlipidemia) She has a 24.3% risk for cardiovascular disease over the next 10years. Atorvastatin sent Lipid Panel     Component Value Date/Time   CHOL 229 (H) 09/22/2020 1457   TRIG 201.0 (H) 09/22/2020 1457   HDL 40.10 09/22/2020 1457   CHOLHDL 6 09/22/2020 1457   VLDL 40.2 (H) 09/22/2020 1457   LDLCALC 134 (H) 12/29/2015 0502   LDLDIRECT 151.0 09/22/2020 1457    Hyperglycemia Stable with HgbA1c at 5.8% Repeat in 72months  Atherosclerosis of aorta (HCC) 24.3% risk for cardiovascular disease over the next 10years. Start Atorvastatin 20mg , repeat lipid panel in 40months BP at goal No tobacco use. HgbA1c at 5.8%, repeat in 41months.   Reviewed past Medical, Social and Family history today.  Outpatient Medications Prior to Visit  Medication Sig Dispense Refill  . amLODipine (NORVASC) 10 MG tablet Take 1 tablet (10 mg total) by mouth daily. 90 tablet 3  . atenolol (TENORMIN) 25 MG tablet Take 1 tablet (25 mg total) by mouth daily. 90 tablet 3  . diclofenac Sodium (VOLTAREN) 1 % GEL Apply 2 g topically 4 (four) times daily. 100 g 0  . Fluocinonide Emulsified Base 0.05 % CREA APPLY 1 APPLICATION TOPICALLY 2 (TWO) TIMES DAILY. 30 g 0  . hydrochlorothiazide (HYDRODIURIL) 25 MG tablet Take 1 tablet (25 mg total) by mouth daily.  90 tablet 1  . HYDROcodone-acetaminophen (NORCO) 7.5-325 MG tablet Take 1 tablet by mouth every 8 (eight) hours. For chronic back pain management 90 tablet 0  . meclizine (ANTIVERT) 25 MG tablet Take 0.5 tablets (12.5 mg total) by mouth 2 (two) times daily as needed for dizziness. 30 tablet 0  . gabapentin (NEURONTIN) 300 MG capsule Take 1 capsule (300 mg total) by mouth 2 (two) times daily. (Patient not taking: Reported on 09/22/2020) 60 capsule 5   No facility-administered medications prior to visit.    ROS See HPI  Objective:  BP 126/60 (BP Location: Left Arm, Patient Position: Sitting, Cuff Size: Large)   Pulse 60   Temp 98.1 F (36.7 C) (Temporal)   Ht 5\' 5"  (1.651 m)   Wt 211 lb 6.4 oz (95.9 kg)   SpO2 98%   BMI 35.18 kg/m   Physical Exam Cardiovascular:     Rate and Rhythm: Normal rate.     Pulses: Normal pulses.  Pulmonary:     Effort: Pulmonary effort is normal.  Musculoskeletal:     Right lower leg: No edema.     Left lower leg: No edema.  Neurological:     Mental Status: She is alert and oriented to person, place, and time.    Assessment & Plan:  This visit occurred during the SARS-CoV-2 public health emergency.  Safety protocols were in place, including screening questions prior to the visit, additional usage of staff PPE, and extensive  cleaning of exam room while observing appropriate contact time as indicated for disinfecting solutions.   Heather Shaw was seen today for follow-up.  Diagnoses and all orders for this visit:  Atherosclerosis of aorta (HCC) -     Lipid panel -     atorvastatin (LIPITOR) 20 MG tablet; Take 1 tablet (20 mg total) by mouth daily.  Primary hypertension -     Basic metabolic panel  Other chronic pain -     gabapentin (NEURONTIN) 100 MG capsule; Take 2 capsules (200 mg total) by mouth at bedtime.  Mixed hyperlipidemia -     Lipid panel -     atorvastatin (LIPITOR) 20 MG tablet; Take 1 tablet (20 mg total) by mouth daily.  Opioid  type dependence, continuous (HCC) -     gabapentin (NEURONTIN) 100 MG capsule; Take 2 capsules (200 mg total) by mouth at bedtime.  DDD (degenerative disc disease), lumbosacral -     gabapentin (NEURONTIN) 100 MG capsule; Take 2 capsules (200 mg total) by mouth at bedtime.  Hyperglycemia -     Hemoglobin A1c  Other orders -     LDL cholesterol, direct    Problem List Items Addressed This Visit      Cardiovascular and Mediastinum   Atherosclerosis of aorta (HCC) - Primary    24.3% risk for cardiovascular disease over the next 10years. Start Atorvastatin 20mg , repeat lipid panel in 71months BP at goal No tobacco use. HgbA1c at 5.8%, repeat in 28months.      Relevant Medications   atorvastatin (LIPITOR) 20 MG tablet   Other Relevant Orders   Lipid panel (Completed)   Hypertensive disorder    BP at goal with amlodipine, HCTZ and atenolol BP Readings from Last 3 Encounters:  09/22/20 126/60  08/26/20 130/62  06/06/20 (!) 141/81        Relevant Medications   atorvastatin (LIPITOR) 20 MG tablet   Other Relevant Orders   Basic metabolic panel (Completed)     Musculoskeletal and Integument   DDD (degenerative disc disease), lumbosacral   Relevant Medications   gabapentin (NEURONTIN) 100 MG capsule     Other   Chronic pain    Renewed contract today Unable to tolerate 300mg  gabapentin: abd pain  Maintain hydrocodone dose Resume gabapentin 200mg  at hs F/up in 72month      Relevant Medications   gabapentin (NEURONTIN) 100 MG capsule   HLD (hyperlipidemia)    She has a 24.3% risk for cardiovascular disease over the next 10years. Atorvastatin sent Lipid Panel     Component Value Date/Time   CHOL 229 (H) 09/22/2020 1457   TRIG 201.0 (H) 09/22/2020 1457   HDL 40.10 09/22/2020 1457   CHOLHDL 6 09/22/2020 1457   VLDL 40.2 (H) 09/22/2020 1457   LDLCALC 134 (H) 12/29/2015 0502   LDLDIRECT 151.0 09/22/2020 1457        Relevant Medications   atorvastatin (LIPITOR)  20 MG tablet   Other Relevant Orders   Lipid panel (Completed)   Hyperglycemia    Stable with HgbA1c at 5.8% Repeat in 92months      Relevant Orders   Hemoglobin A1c (Completed)   Opioid type dependence, continuous ([place])    Up to date with controlled substance contract and UDS. Use of gabapentin and hydrocodone Uses total of 7.5mg  MME per day      Relevant Medications   gabapentin (NEURONTIN) 100 MG capsule      Follow-up: Return in about 3 months (around 12/20/2020) for chronic pain  management.  Wilfred Lacy, NP

## 2020-09-22 NOTE — Patient Instructions (Addendum)
Stable HgbA1c at 5.8: prediabetes Stable renal function Abnormal lipid panel: your have a 24.3% risk for cardiovascular disease over the next 10years. I recommend use of atorvastatin. Are you ok with this prescription?

## 2020-09-22 NOTE — Assessment & Plan Note (Signed)
Up to date with controlled substance contract and UDS. Use of gabapentin and hydrocodone Uses total of 7.5mg  MME per day

## 2020-09-22 NOTE — Assessment & Plan Note (Signed)
Renewed contract today Unable to tolerate 300mg  gabapentin: abd pain  Maintain hydrocodone dose Resume gabapentin 200mg  at hs F/up in 41month

## 2020-09-22 NOTE — Assessment & Plan Note (Signed)
BP at goal with amlodipine, HCTZ and atenolol BP Readings from Last 3 Encounters:  09/22/20 126/60  08/26/20 130/62  06/06/20 (!) 141/81

## 2020-09-23 LAB — BASIC METABOLIC PANEL
BUN: 16 mg/dL (ref 6–23)
CO2: 28 mEq/L (ref 19–32)
Calcium: 9.3 mg/dL (ref 8.4–10.5)
Chloride: 101 mEq/L (ref 96–112)
Creatinine, Ser: 0.7 mg/dL (ref 0.40–1.20)
GFR: 83.28 mL/min (ref >60.00)
Glucose, Bld: 111 mg/dL — ABNORMAL HIGH (ref 70–99)
Potassium: 3.5 mEq/L (ref 3.5–5.1)
Sodium: 138 mEq/L (ref 135–145)

## 2020-09-23 LAB — HEMOGLOBIN A1C: Hgb A1c MFr Bld: 5.8 % (ref 4.6–6.5)

## 2020-09-23 LAB — LIPID PANEL
Cholesterol: 229 mg/dL — ABNORMAL HIGH (ref 0–200)
HDL: 40.1 mg/dL (ref >39.00)
NonHDL: 188.67
Total CHOL/HDL Ratio: 6
Triglycerides: 201 mg/dL — ABNORMAL HIGH (ref 0.0–149.0)
VLDL: 40.2 mg/dL — ABNORMAL HIGH (ref 0.0–40.0)

## 2020-09-23 LAB — LDL CHOLESTEROL, DIRECT: Direct LDL: 151 mg/dL

## 2020-09-25 ENCOUNTER — Other Ambulatory Visit: Payer: Self-pay | Admitting: Nurse Practitioner

## 2020-09-25 DIAGNOSIS — R739 Hyperglycemia, unspecified: Secondary | ICD-10-CM | POA: Insufficient documentation

## 2020-09-25 DIAGNOSIS — I700 Atherosclerosis of aorta: Secondary | ICD-10-CM | POA: Insufficient documentation

## 2020-09-25 MED ORDER — ATORVASTATIN CALCIUM 20 MG PO TABS: 20.0000 mg | ORAL_TABLET | Freq: Every day | ORAL | 3 refills | Status: DC

## 2020-09-25 MED FILL — ATORVASTATIN CALCIUM 20 MG: 20 | 90 days supply | Qty: 90 | Fill #0

## 2020-09-25 NOTE — Assessment & Plan Note (Signed)
She has a 24.3% risk for cardiovascular disease over the next 10years. Atorvastatin sent Lipid Panel     Component Value Date/Time   CHOL 229 (H) 09/22/2020 1457   TRIG 201.0 (H) 09/22/2020 1457   HDL 40.10 09/22/2020 1457   CHOLHDL 6 09/22/2020 1457   VLDL 40.2 (H) 09/22/2020 1457   LDLCALC 134 (H) 12/29/2015 0502   LDLDIRECT 151.0 09/22/2020 1457

## 2020-09-25 NOTE — Assessment & Plan Note (Signed)
Stable with HgbA1c at 5.8% Repeat in 43months

## 2020-09-25 NOTE — Assessment & Plan Note (Signed)
24.3% risk for cardiovascular disease over the next 10years. Start Atorvastatin 20mg , repeat lipid panel in 67months BP at goal No tobacco use. HgbA1c at 5.8%, repeat in 20months.

## 2020-09-29 ENCOUNTER — Telehealth: Payer: Self-pay | Admitting: Nurse Practitioner

## 2020-09-29 DIAGNOSIS — I700 Atherosclerosis of aorta: Secondary | ICD-10-CM

## 2020-09-29 NOTE — Telephone Encounter (Signed)
Patient is returning call regarding her labs. Please call her back at [phone].

## 2020-09-30 ENCOUNTER — Ambulatory Visit (INDEPENDENT_AMBULATORY_CARE_PROVIDER_SITE_OTHER): Payer: Medicare Other

## 2020-09-30 VITALS — Ht 65.0 in | Wt 211.0 lb

## 2020-09-30 DIAGNOSIS — Z0000 Encounter for general adult medical examination without abnormal findings: Secondary | ICD-10-CM

## 2020-09-30 NOTE — Assessment & Plan Note (Signed)
She declined use of any statin.

## 2020-09-30 NOTE — Progress Notes (Signed)
Subjective:   Heather Shaw is a 80 y.o. female who presents for Medicare Annual (Subsequent) preventive examination.  I connected with  Heather Shaw today by a video enabled telemedicine application and verified that I am speaking with the correct person using two identifiers.  Location of patient:Home Location of provider:Work  Persons participating in the virtual visit: patient, nurse.   I discussed the limitations, risk, security and privacy concerns of evaluation and management by telemedicine. The patient expressed understanding and agreed to proceed.  Some vital signs may be absent or patient reported.   Review of Systems     Cardiac Risk Factors include: advanced age (>81men, >70 women);hypertension;dyslipidemia;obesity (BMI >30kg/m2)     Objective:    Today's Vitals   09/30/20 1329  Weight: 211 lb (95.7 kg)  Height: 5\' 5"  (1.651 m)  PainSc: 6    Body mass index is 35.11 kg/m.  Advanced Directives 09/30/2020 09/26/2019 11/16/2017 11/08/2016 08/23/2016 08/19/2016 12/27/2015  Does Patient Have a Medical Advance Directive? No No No No No No No  Type of Advance Directive - - - - - - -  Does patient want to make changes to medical advance directive? - - - - - - -  Copy of [place] in Chart? - - - - - - -  Would patient like information on creating a medical advance directive? Yes (MAU/Ambulatory/Procedural Areas - Information given) No - Patient declined No - Patient declined - No - Patient declined - No - patient declined information    Current Medications (verified) Outpatient Encounter Medications as of 09/30/2020  Medication Sig  . amLODipine (NORVASC) 10 MG tablet Take 1 tablet (10 mg total) by mouth daily.  Marland Kitchen atenolol (TENORMIN) 25 MG tablet Take 1 tablet (25 mg total) by mouth daily.  Marland Kitchen atorvastatin (LIPITOR) 20 MG tablet Take 1 tablet (20 mg total) by mouth daily.  . diclofenac Sodium (VOLTAREN) 1 % GEL Apply 2 g topically 4 (four) times daily.  .  Fluocinonide Emulsified Base 0.05 % CREA APPLY 1 APPLICATION TOPICALLY 2 (TWO) TIMES DAILY.  Marland Kitchen gabapentin (NEURONTIN) 100 MG capsule Take 2 capsules (200 mg total) by mouth at bedtime.  . hydrochlorothiazide (HYDRODIURIL) 25 MG tablet Take 1 tablet (25 mg total) by mouth daily.  Marland Kitchen HYDROcodone-acetaminophen (NORCO) 7.5-325 MG tablet Take 1 tablet by mouth every 8 (eight) hours. For chronic back pain management  . meclizine (ANTIVERT) 25 MG tablet Take 0.5 tablets (12.5 mg total) by mouth 2 (two) times daily as needed for dizziness.   No facility-administered encounter medications on file as of 09/30/2020.    Allergies (verified) Contrast media [iodinated diagnostic agents], Lisinopril, Allegra [fexofenadine], Codeine, Nsaids, Prednisone, Zithromax [azithromycin], and Sulfa antibiotics   History: Past Medical History:  Diagnosis Date  . Arthritis   . Bladder pain   . Bradycardia   . DDD (degenerative disc disease), lumbosacral   . Grief 05/24/2018  . Hyperlipidemia   . Hyperplastic colon polyp   . Hypertension   . IC (interstitial cystitis)   . Insomnia 04/06/2010   Qualifier: Diagnosis of  By: Deborra Medina MD, Tanja Port    . Internal hemorrhoids   . Polyp of rectum   . PONV (postoperative nausea and vomiting)   . Right wrist tendonitis 08/24/2017  . Squamous papilloma    in esophageal polyp  . Uterine fibroid   . Vertigo   . Wears glasses   . Wears partial dentures    Past Surgical History:  Procedure Laterality  Date  . COLONOSCOPY W/ POLYPECTOMY  2009  . CYSTO WITH HYDRODISTENSION N/A 05/20/2014   Procedure: CYSTOSCOPY/HYDRODISTENSION, INSTILLATION OF CHLORPACTIN;  Surgeon: Bernestine Amass, MD;  Location: [facility];  Service: Urology;  Laterality: N/A;  . CYSTO/  HYDRODISTENTION/  INSTILLATION CLORPACTIC  09-21-2010  . DILATION AND CURETTAGE OF UTERUS  1988  . KNEE ARTHROSCOPY Right 11-18-2009  . TRIGGER FINGER RELEASE Right 08/23/2016   Procedure: RIGHT RING FINGER  TRIGGER RELEASE;  Surgeon: Milly Jakob, MD;  Location: [place];  Service: Orthopedics;  Laterality: Right;   Family History  Problem Relation Age of Onset  . Clotting disorder Mother 20  . COPD Father   . Heart attack Father 86  . Emphysema Father   . Stroke Paternal Aunt   . Hypertension Sister   . Hypertension Brother    Social History   Socioeconomic History  . Marital status: Widowed    Spouse name: Not on file  . Number of children: 6  . Years of education: Not on file  . Highest education level: Not on file  Occupational History  . Occupation: housewife  Tobacco Use  . Smoking status: Former Smoker    Types: Cigarettes    Quit date: 05/17/1971    Years since quitting: 49.4  . Smokeless tobacco: Never Used  Substance and Sexual Activity  . Alcohol use: No    Alcohol/week: 0.0 standard drinks  . Drug use: No  . Sexual activity: Never  Other Topics Concern  . Not on file  Social History Narrative  . Not on file   Social Determinants of Health   Financial Resource Strain: Low Risk   . Difficulty of Paying Living Expenses: Not hard at all  Food Insecurity: No Food Insecurity  . Worried About Charity fundraiser in the Last Year: Never true  . Ran Out of Food in the Last Year: Never true  Transportation Needs: No Transportation Needs  . Lack of Transportation (Medical): No  . Lack of Transportation (Non-Medical): No  Physical Activity: Sufficiently Active  . Days of Exercise per Week: 7 days  . Minutes of Exercise per Session: 30 min  Stress: No Stress Concern Present  . Feeling of Stress : Not at all  Social Connections: Socially Isolated  . Frequency of Communication with Friends and Family: More than three times a week  . Frequency of Social Gatherings with Friends and Family: Once a week  . Attends Religious Services: Never  . Active Member of Clubs or Organizations: No  . Attends Archivist Meetings: Never  . Marital  Status: Widowed    Tobacco Counseling Counseling given: Not Answered   Clinical Intake:  Pre-visit preparation completed: Yes  Pain : 0-10 Pain Score: 6  Pain Type: Chronic pain Pain Location: Back Pain Onset: More than a month ago Pain Frequency: Constant     Nutritional Status: BMI > 30  Obese Nutritional Risks: None Diabetes: No  How often do you need to have someone help you when you read instructions, pamphlets, or other written materials from your doctor or pharmacy?: 1 - Never  Diabetic?No  Interpreter Needed?: No  Information entered by :: Caroleen Hamman LPN   Activities of Daily Living In your present state of health, do you have any difficulty performing the following activities: 09/30/2020  Hearing? N  Vision? N  Difficulty concentrating or making decisions? N  Walking or climbing stairs? N  Dressing or bathing? N  Doing  errands, shopping? N  Preparing Food and eating ? N  Using the Toilet? N  In the past six months, have you accidently leaked urine? Y  Comment occasionally  Do you have problems with loss of bowel control? N  Managing your Medications? N  Managing your Finances? N  Housekeeping or managing your Housekeeping? N  Some recent data might be hidden    Patient Care Team: [name], Charlene Brooke, NP as PCP - General (Internal Medicine) Rana Snare, MD as Consulting Physician (Urology) Maisie Fus, MD as Consulting Physician (Obstetrics and Gynecology) Juluis Rainier as Consulting Physician (Optometry) Lavonna Monarch, MD as Consulting Physician (Dermatology)  Indicate any recent Medical Services you may have received from other than [facility] providers in the past year (date may be approximate).     Assessment:   This is a routine wellness examination for [facility].  Hearing/Vision screen  Hearing Screening   125Hz  250Hz  500Hz  1000Hz  2000Hz  3000Hz  4000Hz  6000Hz  8000Hz   Right ear:           Left ear:           Comments: C/o some mild  hearing loss  Vision Screening Comments: Wears glasses Last eye exam-08/2020-Dr. Dunn  Dietary issues and exercise activities discussed: Current Exercise Habits: Home exercise routine, Type of exercise: stretching, Time (Minutes): 30, Frequency (Times/Week): 7, Weekly Exercise (Minutes/Week): 210, Intensity: Mild, Exercise limited by: None identified  Goals    . Patient Stated     Get cholesterol level down with diet & exercise      Depression Screen PHQ 2/9 Scores 09/30/2020 09/26/2019 07/09/2019 11/16/2017 11/08/2016 11/03/2015 05/29/2012  PHQ - 2 Score 0 0 0 0 0 0 0    Fall Risk Fall Risk  09/30/2020 06/06/2020 09/26/2019 06/20/2019 11/16/2017  Falls in the past year? 0 0 0 0 No  Comment - - - Emmi Telephone Survey: data to providers prior to load -  Number falls in past yr: 0 0 0 - -  Injury with Fall? 0 0 0 - -  Follow up Falls prevention discussed - Education provided;Falls prevention discussed - -    FALL RISK PREVENTION PERTAINING TO THE HOME:  Any stairs in or around the home? Yes  If so, are there any without handrails? No  Home free of loose throw rugs in walkways, pet beds, electrical cords, etc? Yes  Adequate lighting in your home to reduce risk of falls? Yes   ASSISTIVE DEVICES UTILIZED TO PREVENT FALLS:  Life alert? No  Use of a cane, walker or w/c? No  Grab bars in the bathroom? Yes  Shower chair or bench in shower? No  Elevated toilet seat or a handicapped toilet? No   TIMED UP AND GO:  Was the test performed? No . Phone visit   Cognitive Function:Normal cognitive status assessed by this Nurse Health Advisor. No abnormalities found.   MMSE - Mini Mental State Exam 11/16/2017 11/08/2016 11/03/2015  Orientation to time 5 5 5   Orientation to Place 5 5 5   Registration 3 3 3   Attention/ Calculation 5 0 0  Recall 3 3 3   Language- name 2 objects 2 0 0  Language- repeat 1 1 1   Language- follow 3 step command 3 3 3   Language- read & follow direction 1 0 0  Write a  sentence 1 0 0  Copy design 1 0 0  Total score 30 20 20         Immunizations Immunization History  Administered Date(s)  Administered  . Influenza Split 05/29/2012  . Influenza, High Dose Seasonal PF 05/24/2018, 05/29/2019  . Influenza,inj,Quad PF,6+ Mos 06/23/2016, 05/27/2017  . Influenza-Unspecified 06/05/2020  . PFIZER(Purple Top)SARS-COV-2 Vaccination 10/28/2019, 12/08/2019  . Pneumococcal Conjugate-13 11/03/2015  . Pneumococcal Polysaccharide-23 07/14/2006, 05/29/2012  . Td 07/11/2008  . Tdap 04/23/2014  . Zoster 10/27/2010    TDAP status: Up to date  Flu Vaccine status: Up to date  Pneumococcal vaccine status: Up to date  Covid-19 vaccine status: Information provided on how to obtain vaccines. Booster due  Qualifies for Shingles Vaccine? Yes   Zostavax completed Yes   Shingrix Completed?: No.    Education has been provided regarding the importance of this vaccine. Patient has been advised to call insurance company to determine out of pocket expense if they have not yet received this vaccine. Advised may also receive vaccine at local pharmacy or Health Dept. Verbalized acceptance and understanding.  Screening Tests Health Maintenance  Topic Date Due  . Hepatitis C Screening  Never done  . URINE MICROALBUMIN  Never done  . COVID-19 Vaccine (3 - Pfizer risk 4-dose series) 01/05/2020  . TETANUS/TDAP  04/23/2024  . INFLUENZA VACCINE  Completed  . DEXA SCAN  Completed  . PNA vac Low Risk Adult  Completed  . HPV VACCINES  Aged Out    Health Maintenance  Health Maintenance Due  Topic Date Due  . Hepatitis C Screening  Never done  . URINE MICROALBUMIN  Never done  . COVID-19 Vaccine (3 - Pfizer risk 4-dose series) 01/05/2020    Colorectal cancer screening: No longer required.   Mammogram status: Per patient, completed at GYN office & is up to date. Requested copy of results be sent to PCP.  Bone Density status:Per patient, completed at GYN office & is up to  date. Requested copy of results be sent to PCP.  Lung Cancer Screening: (Low Dose CT Chest recommended if Age 89-80 years, 30 pack-year currently smoking OR have quit w/in 15years.) does not qualify.    Additional Screening:  Hepatitis C Screening: does not qualify  Vision Screening: Recommended annual ophthalmology exams for early detection of glaucoma and other disorders of the eye. Is the patient up to date with their annual eye exam?  Yes  Who is the provider or what is the name of the office in which the patient attends annual eye exams? Dr. Idolina Primer   Dental Screening: Recommended annual dental exams for proper oral hygiene  Community Resource Referral / Chronic Care Management: CRR required this visit?  No   CCM required this visit?  No      Plan:     I have personally reviewed and noted the following in the patient's chart:   . Medical and social history . Use of alcohol, tobacco or illicit drugs  . Current medications and supplements . Functional ability and status . Nutritional status . Physical activity . Advanced directives . List of other physicians . Hospitalizations, surgeries, and ER visits in previous 12 months . Vitals . Screenings to include cognitive, depression, and falls . Referrals and appointments  In addition, I have reviewed and discussed with patient certain preventive protocols, quality metrics, and best practice recommendations. A written personalized care plan for preventive services as well as general preventive health recommendations were provided to patient.   Due to this being a virtual visit, the after visit summary with patients personalized plan was offered to patient via mail or my-chart.  Patient would like to access on  my-chart.    Marta Antu, LPN   11/02/8262  Nurse Health Advisor  Nurse Notes: None

## 2020-09-30 NOTE — Telephone Encounter (Signed)
Patient notified VIA phone and states that she would prefer not to take Atorvastin due to having IC.  Is there any other recommendations with diet?  Please review and advise.  Thanks.  Dm/cma

## 2020-09-30 NOTE — Patient Instructions (Signed)
Ms. Heather Shaw , Thank you for taking time to complete your Medicare Wellness Visit. I appreciate your ongoing commitment to your health goals. Please review the following plan we discussed and let me know if I can assist you in the future.   Screening recommendations/referrals: Colonoscopy: No longer required Mammogram: Per our conversation, completed at GYN office.Please have copy of results sent to PCP. Bone Density: Per our conversation, completed at GYN office.Please have copy of results sent to PCP. Recommended yearly ophthalmology/optometry visit for glaucoma screening and checkup Recommended yearly dental visit for hygiene and checkup  Vaccinations: Influenza vaccine: Up to date Pneumococcal vaccine: Completed vaccines Tdap vaccine: Up to date-Due-04/23/2024 Shingles vaccine: Discuss with pharmacy Covid-19:Booster due  Advanced directives: Information mailed today  Conditions/risks identified: See problem list  Next appointment: Follow up in one year for your annual wellness visit 10/06/2021 @ 1:30   Preventive Care 78 Years and Older, Female Preventive care refers to lifestyle choices and visits with your health care provider that can promote health and wellness. What does preventive care include?  A yearly physical exam. This is also called an annual well check.  Dental exams once or twice a year.  Routine eye exams. Ask your health care provider how often you should have your eyes checked.  Personal lifestyle choices, including:  Daily care of your teeth and gums.  Regular physical activity.  Eating a healthy diet.  Avoiding tobacco and drug use.  Limiting alcohol use.  Practicing safe sex.  Taking low-dose aspirin every day.  Taking vitamin and mineral supplements as recommended by your health care provider. What happens during an annual well check? The services and screenings done by your health care provider during your annual well check will depend on your  age, overall health, lifestyle risk factors, and family history of disease. Counseling  Your health care provider may ask you questions about your:  Alcohol use.  Tobacco use.  Drug use.  Emotional well-being.  Home and relationship well-being.  Sexual activity.  Eating habits.  History of falls.  Memory and ability to understand (cognition).  Work and work Statistician.  Reproductive health. Screening  You may have the following tests or measurements:  Height, weight, and BMI.  Blood pressure.  Lipid and cholesterol levels. These may be checked every 5 years, or more frequently if you are over 78 years old.  Skin check.  Lung cancer screening. You may have this screening every year starting at age 78 if you have a 30-pack-year history of smoking and currently smoke or have quit within the past 15 years.  Fecal occult blood test (FOBT) of the stool. You may have this test every year starting at age 78.  Flexible sigmoidoscopy or colonoscopy. You may have a sigmoidoscopy every 5 years or a colonoscopy every 10 years starting at age 78.  Hepatitis C blood test.  Hepatitis B blood test.  Sexually transmitted disease (STD) testing.  Diabetes screening. This is done by checking your blood sugar (glucose) after you have not eaten for a while (fasting). You may have this done every 1-3 years.  Bone density scan. This is done to screen for osteoporosis. You may have this done starting at age 78.  Mammogram. This may be done every 1-2 years. Talk to your health care provider about how often you should have regular mammograms. Talk with your health care provider about your test results, treatment options, and if necessary, the need for more tests. Vaccines  Your health care  provider may recommend certain vaccines, such as:  Influenza vaccine. This is recommended every year.  Tetanus, diphtheria, and acellular pertussis (Tdap, Td) vaccine. You may need a Td booster  every 10 years.  Zoster vaccine. You may need this after age 39.  Pneumococcal 13-valent conjugate (PCV13) vaccine. One dose is recommended after age 2.  Pneumococcal polysaccharide (PPSV23) vaccine. One dose is recommended after age 78. Talk to your health care provider about which screenings and vaccines you need and how often you need them. This information is not intended to replace advice given to you by your health care provider. Make sure you discuss any questions you have with your health care provider. Document Released: 08/08/2015 Document Revised: 03/31/2016 Document Reviewed: 05/13/2015 Elsevier Interactive Patient Education  2017 [place] Prevention in the Home Falls can cause injuries. They can happen to people of all ages. There are many things you can do to make your home safe and to help prevent falls. What can I do on the outside of my home?  Regularly fix the edges of walkways and driveways and fix any cracks.  Remove anything that might make you trip as you walk through a door, such as a raised step or threshold.  Trim any bushes or trees on the path to your home.  Use bright outdoor lighting.  Clear any walking paths of anything that might make someone trip, such as rocks or tools.  Regularly check to see if handrails are loose or broken. Make sure that both sides of any steps have handrails.  Any raised decks and porches should have guardrails on the edges.  Have any leaves, snow, or ice cleared regularly.  Use sand or salt on walking paths during winter.  Clean up any spills in your garage right away. This includes oil or grease spills. What can I do in the bathroom?  Use night lights.  Install grab bars by the toilet and in the tub and shower. Do not use towel bars as grab bars.  Use non-skid mats or decals in the tub or shower.  If you need to sit down in the shower, use a plastic, non-slip stool.  Keep the floor dry. Clean up any  water that spills on the floor as soon as it happens.  Remove soap buildup in the tub or shower regularly.  Attach bath mats securely with double-sided non-slip rug tape.  Do not have throw rugs and other things on the floor that can make you trip. What can I do in the bedroom?  Use night lights.  Make sure that you have a light by your bed that is easy to reach.  Do not use any sheets or blankets that are too big for your bed. They should not hang down onto the floor.  Have a firm chair that has side arms. You can use this for support while you get dressed.  Do not have throw rugs and other things on the floor that can make you trip. What can I do in the kitchen?  Clean up any spills right away.  Avoid walking on wet floors.  Keep items that you use a lot in easy-to-reach places.  If you need to reach something above you, use a strong step stool that has a grab bar.  Keep electrical cords out of the way.  Do not use floor polish or wax that makes floors slippery. If you must use wax, use non-skid floor wax.  Do not have throw  rugs and other things on the floor that can make you trip. What can I do with my stairs?  Do not leave any items on the stairs.  Make sure that there are handrails on both sides of the stairs and use them. Fix handrails that are broken or loose. Make sure that handrails are as long as the stairways.  Check any carpeting to make sure that it is firmly attached to the stairs. Fix any carpet that is loose or worn.  Avoid having throw rugs at the top or bottom of the stairs. If you do have throw rugs, attach them to the floor with carpet tape.  Make sure that you have a light switch at the top of the stairs and the bottom of the stairs. If you do not have them, ask someone to add them for you. What else can I do to help prevent falls?  Wear shoes that:  Do not have high heels.  Have rubber bottoms.  Are comfortable and fit you well.  Are closed  at the toe. Do not wear sandals.  If you use a stepladder:  Make sure that it is fully opened. Do not climb a closed stepladder.  Make sure that both sides of the stepladder are locked into place.  Ask someone to hold it for you, if possible.  Clearly mark and make sure that you can see:  Any grab bars or handrails.  First and last steps.  Where the edge of each step is.  Use tools that help you move around (mobility aids) if they are needed. These include:  Canes.  Walkers.  Scooters.  Crutches.  Turn on the lights when you go into a dark area. Replace any light bulbs as soon as they burn out.  Set up your furniture so you have a clear path. Avoid moving your furniture around.  If any of your floors are uneven, fix them.  If there are any pets around you, be aware of where they are.  Review your medicines with your doctor. Some medicines can make you feel dizzy. This can increase your chance of falling. Ask your doctor what other things that you can do to help prevent falls. This information is not intended to replace advice given to you by your health care provider. Make sure you discuss any questions you have with your health care provider. Document Released: 05/08/2009 Document Revised: 12/18/2015 Document Reviewed: 08/16/2014 Elsevier Interactive Patient Education  2017 [employer].

## 2020-10-03 ENCOUNTER — Other Ambulatory Visit: Payer: Self-pay | Admitting: Nurse Practitioner

## 2020-10-03 DIAGNOSIS — M5137 Other intervertebral disc degeneration, lumbosacral region: Secondary | ICD-10-CM

## 2020-10-03 DIAGNOSIS — M5416 Radiculopathy, lumbar region: Secondary | ICD-10-CM

## 2020-10-03 DIAGNOSIS — F1120 Opioid dependence, uncomplicated: Secondary | ICD-10-CM

## 2020-10-03 NOTE — Telephone Encounter (Signed)
Chart supports Rx PMP data shows last 3 dates filled were  07/07/20 08/07/20 09/05/20  Qty 90

## 2020-10-03 NOTE — Telephone Encounter (Signed)
Patient is calling to get a refill on her Hydrocodone. She also has a question regarding the quantity. Please call her at [phone] and advise.

## 2020-10-03 NOTE — Telephone Encounter (Signed)
Patient notified and verbalized understanding. 

## 2020-10-06 ENCOUNTER — Other Ambulatory Visit: Payer: Self-pay | Admitting: Nurse Practitioner

## 2020-10-06 NOTE — Telephone Encounter (Signed)
Patient is calling to check the status of her Hydrocodone refill. She states that she is completely out. Informed patient that her provider was out of the office today, but would return tomorrow. She wants to know if another provider can send in her refill.

## 2020-10-08 ENCOUNTER — Telehealth: Payer: Self-pay | Admitting: Nurse Practitioner

## 2020-10-08 NOTE — Telephone Encounter (Signed)
error 

## 2020-10-21 MED FILL — GABAPENTIN 100 MG CAPSULE: 100 | 90 days supply | Qty: 180 | Fill #0

## 2020-11-01 DIAGNOSIS — R350 Frequency of micturition: Secondary | ICD-10-CM | POA: Diagnosis not present

## 2020-11-01 DIAGNOSIS — R309 Painful micturition, unspecified: Secondary | ICD-10-CM | POA: Diagnosis not present

## 2020-11-01 DIAGNOSIS — R109 Unspecified abdominal pain: Secondary | ICD-10-CM | POA: Diagnosis not present

## 2020-11-01 DIAGNOSIS — R1030 Lower abdominal pain, unspecified: Secondary | ICD-10-CM | POA: Diagnosis not present

## 2020-11-01 DIAGNOSIS — R110 Nausea: Secondary | ICD-10-CM | POA: Diagnosis not present

## 2020-11-03 ENCOUNTER — Other Ambulatory Visit (HOSPITAL_COMMUNITY): Payer: Self-pay

## 2020-11-03 ENCOUNTER — Other Ambulatory Visit: Payer: Self-pay | Admitting: Nurse Practitioner

## 2020-11-03 ENCOUNTER — Ambulatory Visit: Payer: Medicare Other | Admitting: Nurse Practitioner

## 2020-11-03 DIAGNOSIS — M5137 Other intervertebral disc degeneration, lumbosacral region: Secondary | ICD-10-CM

## 2020-11-03 DIAGNOSIS — F1120 Opioid dependence, uncomplicated: Secondary | ICD-10-CM

## 2020-11-03 DIAGNOSIS — M5416 Radiculopathy, lumbar region: Secondary | ICD-10-CM

## 2020-11-03 NOTE — Telephone Encounter (Signed)
Last OV 09/22/20 Last fill 10/06/20  #90/0

## 2020-11-04 ENCOUNTER — Other Ambulatory Visit (HOSPITAL_COMMUNITY): Payer: Self-pay

## 2020-11-04 DIAGNOSIS — R102 Pelvic and perineal pain: Secondary | ICD-10-CM | POA: Diagnosis not present

## 2020-11-04 DIAGNOSIS — R300 Dysuria: Secondary | ICD-10-CM | POA: Diagnosis not present

## 2020-11-04 MED ORDER — HYDROCODONE-ACETAMINOPHEN 7.5-325 MG PO TABS
1.0000 | ORAL_TABLET | Freq: Three times a day (TID) | ORAL | 0 refills | Status: DC
  Filled 2020-11-06: qty 90, 30d supply, fill #0

## 2020-11-04 NOTE — Telephone Encounter (Signed)
Pt will be out of her prescription by tomorrow if she does not get a refill.

## 2020-11-05 ENCOUNTER — Inpatient Hospital Stay: Payer: Medicare Other | Admitting: Nurse Practitioner

## 2020-11-06 ENCOUNTER — Other Ambulatory Visit (HOSPITAL_COMMUNITY): Payer: Self-pay

## 2020-11-06 NOTE — Telephone Encounter (Signed)
I called pt and let her know her script is ready for pick up at the [facility].

## 2020-11-11 ENCOUNTER — Inpatient Hospital Stay: Payer: Medicare Other | Admitting: Nurse Practitioner

## 2020-11-13 ENCOUNTER — Encounter: Payer: Self-pay | Admitting: Nurse Practitioner

## 2020-11-13 ENCOUNTER — Ambulatory Visit (INDEPENDENT_AMBULATORY_CARE_PROVIDER_SITE_OTHER): Payer: Medicare Other | Admitting: Nurse Practitioner

## 2020-11-13 ENCOUNTER — Other Ambulatory Visit: Payer: Self-pay

## 2020-11-13 VITALS — BP 128/70 | HR 68 | Temp 98.2°F | Ht 65.0 in | Wt 210.6 lb

## 2020-11-13 DIAGNOSIS — R300 Dysuria: Secondary | ICD-10-CM

## 2020-11-13 DIAGNOSIS — R8290 Unspecified abnormal findings in urine: Secondary | ICD-10-CM

## 2020-11-13 LAB — POCT URINALYSIS DIPSTICK
Bilirubin, UA: NEGATIVE
Blood, UA: NEGATIVE
Glucose, UA: NEGATIVE
Ketones, UA: NEGATIVE
Leukocytes, UA: NEGATIVE
Nitrite, UA: NEGATIVE
Protein, UA: POSITIVE — AB
Spec Grav, UA: 1.025 (ref 1.010–1.025)
Urobilinogen, UA: 0.2 E.U./dL
pH, UA: 6 (ref 5.0–8.0)

## 2020-11-13 NOTE — Patient Instructions (Signed)
Maintain adequate oral hydration, f/up appt with urology and use of Uribel as prescribed. You will be contacted with urine culture results.

## 2020-11-13 NOTE — Progress Notes (Signed)
   Subjective:  Patient ID: Heather Shaw, female    DOB: Jan 17, 1941  Age: 80 y.o. MRN: [id number]  CC: Acute Visit (Pt states she finished antibiotics on Sunday for recent UTI and is still having some slight pain/irritation when urinating along with pressure. )  HPI Ms. Schleicher has completed oral abx for UTI, but reports persistent dysuria. No fever, no flank pain, no blood in urine, No constipation or diarrhea. She has a hx of interstitial cystitis. She had a f/up visit with urology 2wks ago. I reviewed urinalysis and urine culture completed by ED provider.  Reviewed past Medical, Social and Family history today.  Outpatient Medications Prior to Visit  Medication Sig Dispense Refill  . amLODipine (NORVASC) 10 MG tablet TAKE 1 TABLET (10 MG TOTAL) BY MOUTH DAILY. 90 tablet 3  . atenolol (TENORMIN) 25 MG tablet Take 1 tablet (25 mg total) by mouth daily. 90 tablet 3  . diclofenac Sodium (VOLTAREN) 1 % GEL APPLY 2 G TOPICALLY 4 (FOUR) TIMES DAILY. 100 g 0  . Fluocinonide Emulsified Base 0.05 % CREA APPLY 1 APPLICATION TOPICALLY 2 (TWO) TIMES DAILY. 30 g 0  . gabapentin (NEURONTIN) 100 MG capsule TAKE 2 CAPSULES (200 MG TOTAL) BY MOUTH AT BEDTIME. 180 capsule 3  . hydrochlorothiazide (HYDRODIURIL) 25 MG tablet TAKE 1 TABLET (25 MG TOTAL) BY MOUTH DAILY. 90 tablet 1  . HYDROcodone-acetaminophen (NORCO) 7.5-325 MG tablet Take 1 tablet by mouth every 8 (eight) hours. Need office visit to get additional refills 90 tablet 0  . meclizine (ANTIVERT) 25 MG tablet TAKE 0.5 TABLETS (12.5 MG TOTAL) BY MOUTH 2 (TWO) TIMES DAILY AS NEEDED FOR DIZZINESS. 30 tablet 0   No facility-administered medications prior to visit.    ROS See HPI  Objective:  BP 128/70 (BP Location: Left Arm, Patient Position: Sitting, Cuff Size: Large)   Pulse 68   Temp 98.2 F (36.8 C) (Oral)   Ht 5\' 5"  (1.651 m)   Wt 210 lb 9.6 oz (95.5 kg)   SpO2 98%   BMI 35.05 kg/m   Physical Exam Pulmonary:     Effort:  Pulmonary effort is normal.  Abdominal:     General: There is no distension.     Palpations: Abdomen is soft.     Tenderness: There is abdominal tenderness. There is no right CVA tenderness, left CVA tenderness or guarding.  Neurological:     Mental Status: She is alert and oriented to person, place, and time.    Assessment & Plan:  This visit occurred during the SARS-CoV-2 public health emergency.  Safety protocols were in place, including screening questions prior to the visit, additional usage of staff PPE, and extensive cleaning of exam room while observing appropriate contact time as indicated for disinfecting solutions.   Heather Shaw was seen today for acute visit.  Diagnoses and all orders for this visit:  Dysuria -     Urine Culture -     POCT Urinalysis Dipstick  Abnormal finding on urinalysis -     Urine Culture   Problem List Items Addressed This Visit   None   Visit Diagnoses    Dysuria    -  Primary   Relevant Orders   Urine Culture   POCT Urinalysis Dipstick (Completed)   Abnormal finding on urinalysis       Relevant Orders   Urine Culture      Follow-up: No follow-ups on file.  Heather Lacy, NP

## 2020-11-14 ENCOUNTER — Encounter: Payer: Self-pay | Admitting: Nurse Practitioner

## 2020-11-14 LAB — URINE CULTURE
MICRO NUMBER:: 11797919
Result:: NO GROWTH
SPECIMEN QUALITY:: ADEQUATE

## 2020-11-27 DIAGNOSIS — M65342 Trigger finger, left ring finger: Secondary | ICD-10-CM | POA: Diagnosis not present

## 2020-11-27 DIAGNOSIS — M65332 Trigger finger, left middle finger: Secondary | ICD-10-CM | POA: Diagnosis not present

## 2020-12-03 ENCOUNTER — Other Ambulatory Visit: Payer: Self-pay

## 2020-12-04 ENCOUNTER — Ambulatory Visit (INDEPENDENT_AMBULATORY_CARE_PROVIDER_SITE_OTHER): Payer: Medicare Other | Admitting: Nurse Practitioner

## 2020-12-04 ENCOUNTER — Other Ambulatory Visit (HOSPITAL_COMMUNITY): Payer: Self-pay

## 2020-12-04 VITALS — BP 134/60 | HR 69 | Temp 97.4°F | Ht 65.0 in | Wt 204.4 lb

## 2020-12-04 DIAGNOSIS — G8929 Other chronic pain: Secondary | ICD-10-CM

## 2020-12-04 DIAGNOSIS — F1120 Opioid dependence, uncomplicated: Secondary | ICD-10-CM

## 2020-12-04 DIAGNOSIS — M5137 Other intervertebral disc degeneration, lumbosacral region: Secondary | ICD-10-CM | POA: Diagnosis not present

## 2020-12-04 DIAGNOSIS — M5416 Radiculopathy, lumbar region: Secondary | ICD-10-CM | POA: Diagnosis not present

## 2020-12-04 MED ORDER — HYDROCODONE-ACETAMINOPHEN 7.5-325 MG PO TABS
1.0000 | ORAL_TABLET | Freq: Three times a day (TID) | ORAL | 0 refills | Status: AC
  Filled 2020-12-05: qty 90, 30d supply, fill #0

## 2020-12-04 MED ORDER — GABAPENTIN 100 MG PO CAPS
ORAL_CAPSULE | ORAL | 1 refills | Status: DC
  Filled 2020-12-04: qty 270, 90d supply, fill #0

## 2020-12-04 NOTE — Patient Instructions (Addendum)
Maintain hydrocodone dose Start gabapentin as prescribed Schedule appt with therapist

## 2020-12-04 NOTE — Progress Notes (Signed)
Subjective:  Patient ID: Heather Shaw, female    DOB: [birth year]  Age: 80 y.o. MRN: [id number]  CC: Follow-up and Medication Refill (Pt is here for a med review. Pt states her legs have been bothering her a lot lately. )  HPI  Chronic pain Controlled with current medication She wants to resume use of gabapentin due to worsening leg pain at HS PMP datadase reviewed: hydrocodone filled 10/06/20, 11/06/20 (#90 each) Denies any adverse side effects.  Hydrocodone refill sent Add gabapentin 100mg . F/up in 59months  Reviewed past Medical, Social and Family history today.  Outpatient Medications Prior to Visit  Medication Sig Dispense Refill  . amLODipine (NORVASC) 10 MG tablet TAKE 1 TABLET (10 MG TOTAL) BY MOUTH DAILY. 90 tablet 3  . amoxicillin (AMOXIL) 500 MG capsule Take 1,000 mg by mouth 2 (two) times daily.    Marland Kitchen atenolol (TENORMIN) 25 MG tablet Take 1 tablet (25 mg total) by mouth daily. 90 tablet 3  . diclofenac Sodium (VOLTAREN) 1 % GEL APPLY 2 G TOPICALLY 4 (FOUR) TIMES DAILY. 100 g 0  . Fluocinonide Emulsified Base 0.05 % CREA APPLY 1 APPLICATION TOPICALLY 2 (TWO) TIMES DAILY. 30 g 0  . hydrochlorothiazide (HYDRODIURIL) 25 MG tablet TAKE 1 TABLET (25 MG TOTAL) BY MOUTH DAILY. 90 tablet 1  . meclizine (ANTIVERT) 25 MG tablet TAKE 0.5 TABLETS (12.5 MG TOTAL) BY MOUTH 2 (TWO) TIMES DAILY AS NEEDED FOR DIZZINESS. 30 tablet 0  . gabapentin (NEURONTIN) 100 MG capsule TAKE 2 CAPSULES (200 MG TOTAL) BY MOUTH AT BEDTIME. 180 capsule 3  . HYDROcodone-acetaminophen (NORCO) 7.5-325 MG tablet Take 1 tablet by mouth every 8 (eight) hours. Need office visit to get additional refills 90 tablet 0   No facility-administered medications prior to visit.    ROS See HPI  Objective:  BP 134/60 (BP Location: Left Arm, Patient Position: Sitting, Cuff Size: Large)   Pulse 69   Temp (!) 97.4 F (36.3 C) (Temporal)   Ht 5\' 5"  (1.651 m)   Wt 204 lb 6.4 oz (92.7 kg)   SpO2 96%   BMI 34.01 kg/m    Physical Exam Constitutional:      Appearance: She is obese.  Cardiovascular:     Rate and Rhythm: Normal rate.     Pulses: Normal pulses.  Pulmonary:     Effort: Pulmonary effort is normal.  Musculoskeletal:     Right lower leg: No edema.     Left lower leg: No edema.  Neurological:     Mental Status: She is alert and oriented to person, place, and time.  Psychiatric:        Attention and Perception: Attention normal.        Mood and Affect: Mood normal.        Speech: Speech normal.        Behavior: Behavior is cooperative.        Thought Content: Thought content normal.        Cognition and Memory: Cognition normal.        Judgment: Judgment normal.     Assessment & Plan:  This visit occurred during the SARS-CoV-2 public health emergency.  Safety protocols were in place, including screening questions prior to the visit, additional usage of staff PPE, and extensive cleaning of exam room while observing appropriate contact time as indicated for disinfecting solutions.   Heather Shaw was seen today for follow-up and medication refill.  Diagnoses and all orders for this visit:  Other chronic  pain -     gabapentin (NEURONTIN) 100 MG capsule; Take 1 capsule (100 mg total) by mouth at bedtime for 7 days, THEN 1 capsule (100 mg total) 2 (two) times daily for 7 days, THEN 1 capsule (100 mg total) 3 (three) times daily. -     HYDROcodone-acetaminophen (NORCO) 7.5-325 MG tablet; Take 1 tablet by mouth every 8 (eight) hours.  DDD (degenerative disc disease), lumbosacral -     gabapentin (NEURONTIN) 100 MG capsule; Take 1 capsule (100 mg total) by mouth at bedtime for 7 days, THEN 1 capsule (100 mg total) 2 (two) times daily for 7 days, THEN 1 capsule (100 mg total) 3 (three) times daily. -     HYDROcodone-acetaminophen (NORCO) 7.5-325 MG tablet; Take 1 tablet by mouth every 8 (eight) hours.  Lumbar radiculopathy, chronic -     gabapentin (NEURONTIN) 100 MG capsule; Take 1 capsule (100 mg  total) by mouth at bedtime for 7 days, THEN 1 capsule (100 mg total) 2 (two) times daily for 7 days, THEN 1 capsule (100 mg total) 3 (three) times daily. -     HYDROcodone-acetaminophen (NORCO) 7.5-325 MG tablet; Take 1 tablet by mouth every 8 (eight) hours.  Opioid type dependence, continuous (HCC) -     gabapentin (NEURONTIN) 100 MG capsule; Take 1 capsule (100 mg total) by mouth at bedtime for 7 days, THEN 1 capsule (100 mg total) 2 (two) times daily for 7 days, THEN 1 capsule (100 mg total) 3 (three) times daily. -     HYDROcodone-acetaminophen (NORCO) 7.5-325 MG tablet; Take 1 tablet by mouth every 8 (eight) hours.   Problem List Items Addressed This Visit      Nervous and Auditory   Lumbar radiculopathy, chronic   Relevant Medications   gabapentin (NEURONTIN) 100 MG capsule   HYDROcodone-acetaminophen (NORCO) 7.5-325 MG tablet     Musculoskeletal and Integument   DDD (degenerative disc disease), lumbosacral   Relevant Medications   gabapentin (NEURONTIN) 100 MG capsule   HYDROcodone-acetaminophen (NORCO) 7.5-325 MG tablet     Other   Chronic pain - Primary    Controlled with current medication She wants to resume use of gabapentin due to worsening leg pain at HS PMP datadase reviewed: hydrocodone filled 10/06/20, 11/06/20 (#90 each) Denies any adverse side effects.  Hydrocodone refill sent Add gabapentin 100mg . F/up in 40months      Relevant Medications   gabapentin (NEURONTIN) 100 MG capsule   HYDROcodone-acetaminophen (NORCO) 7.5-325 MG tablet   Opioid type dependence, continuous (HCC)   Relevant Medications   gabapentin (NEURONTIN) 100 MG capsule   HYDROcodone-acetaminophen (NORCO) 7.5-325 MG tablet      Follow-up: Return in about 3 months (around 03/06/2021) for DM and HTN, hyperlipidemia, chronic pain (105mins, fasting).  Wilfred Lacy, NP

## 2020-12-05 ENCOUNTER — Other Ambulatory Visit (HOSPITAL_COMMUNITY): Payer: Self-pay

## 2020-12-07 ENCOUNTER — Encounter: Payer: Self-pay | Admitting: Nurse Practitioner

## 2020-12-07 NOTE — Assessment & Plan Note (Addendum)
Controlled with current medication She wants to resume use of gabapentin due to worsening leg pain at HS PMP datadase reviewed: hydrocodone filled 10/06/20, 11/06/20 (#90 each) Denies any adverse side effects.  Hydrocodone refill sent Add gabapentin 100mg . F/up in 12months

## 2020-12-23 ENCOUNTER — Ambulatory Visit: Payer: Medicare Other | Admitting: Nurse Practitioner

## 2021-01-01 ENCOUNTER — Telehealth: Payer: Self-pay | Admitting: Nurse Practitioner

## 2021-01-01 NOTE — Telephone Encounter (Signed)
Pt calling to get refill on HYDROcodone-acetaminophen (NORCO) 7.5-325 MG tablet, send to her normal [facility] out pt pharmacy

## 2021-01-02 NOTE — Telephone Encounter (Signed)
Pt called back about refill for hydrocodone.

## 2021-01-05 ENCOUNTER — Other Ambulatory Visit (HOSPITAL_COMMUNITY): Payer: Self-pay

## 2021-01-05 ENCOUNTER — Other Ambulatory Visit: Payer: Self-pay | Admitting: Nurse Practitioner

## 2021-01-05 ENCOUNTER — Telehealth: Payer: Self-pay

## 2021-01-05 DIAGNOSIS — F1120 Opioid dependence, uncomplicated: Secondary | ICD-10-CM

## 2021-01-05 DIAGNOSIS — M5137 Other intervertebral disc degeneration, lumbosacral region: Secondary | ICD-10-CM

## 2021-01-05 DIAGNOSIS — G8929 Other chronic pain: Secondary | ICD-10-CM

## 2021-01-05 DIAGNOSIS — I10 Essential (primary) hypertension: Secondary | ICD-10-CM

## 2021-01-05 DIAGNOSIS — L209 Atopic dermatitis, unspecified: Secondary | ICD-10-CM

## 2021-01-05 DIAGNOSIS — M5416 Radiculopathy, lumbar region: Secondary | ICD-10-CM

## 2021-01-05 DIAGNOSIS — M4807 Spinal stenosis, lumbosacral region: Secondary | ICD-10-CM

## 2021-01-05 MED ORDER — HYDROCODONE-ACETAMINOPHEN 7.5-325 MG PO TABS: 1.0000 | ORAL_TABLET | Freq: Three times a day (TID) | ORAL | 0 refills | Status: DC

## 2021-01-05 MED ORDER — HYDROCODONE-ACETAMINOPHEN 7.5-325 MG PO TABS
1.0000 | ORAL_TABLET | Freq: Three times a day (TID) | ORAL | 0 refills | Status: DC
  Filled 2021-01-05: qty 90, 30d supply, fill #0

## 2021-01-05 MED ORDER — HYDROCHLOROTHIAZIDE 25 MG PO TABS
25.0000 mg | ORAL_TABLET | Freq: Every day | ORAL | 1 refills | Status: DC
  Filled 2021-01-05: qty 90, 90d supply, fill #0

## 2021-01-05 NOTE — Telephone Encounter (Signed)
Last filled 10/06/2020, 11/06/2020, and 12/05/2020. Sent refill for 01/05/2021 and 02/04/2021

## 2021-01-05 NOTE — Telephone Encounter (Signed)
Pt is wanting a script for Fluocinonide Emulsified Base 0.05 % CREA [[id number] for a rash she has. I offered her an appointment, but she wanted a request put in first. Please advise pt.

## 2021-01-05 NOTE — Telephone Encounter (Signed)
Last fill for Hydrocodone 12/05/2020 #90/0 Last fill for Hydrochlorothiazide 07/14/20  #90/1 Last OV 12/04/20

## 2021-01-05 NOTE — Telephone Encounter (Signed)
Patient states that the pharmacy only got her refill for July. They didn't receive the refill for June. She's on her way to the pharmacy to pick up medication. Please resend.

## 2021-01-07 NOTE — Telephone Encounter (Signed)
Filled on 01/05/21 by Wilfred Lacy.

## 2021-01-09 ENCOUNTER — Other Ambulatory Visit (HOSPITAL_COMMUNITY): Payer: Self-pay

## 2021-01-09 MED ORDER — FLUOCINONIDE EMULSIFIED BASE 0.05 % EX CREA
1.0000 "application " | TOPICAL_CREAM | Freq: Two times a day (BID) | CUTANEOUS | 0 refills | Status: DC
  Filled 2021-01-09 – 2021-01-22 (×2): qty 30, 15d supply, fill #0

## 2021-01-19 ENCOUNTER — Other Ambulatory Visit (HOSPITAL_COMMUNITY): Payer: Self-pay

## 2021-01-22 ENCOUNTER — Other Ambulatory Visit (HOSPITAL_COMMUNITY): Payer: Self-pay

## 2021-02-02 ENCOUNTER — Other Ambulatory Visit: Payer: Self-pay | Admitting: Nurse Practitioner

## 2021-02-02 ENCOUNTER — Other Ambulatory Visit (HOSPITAL_COMMUNITY): Payer: Self-pay

## 2021-02-02 DIAGNOSIS — M4807 Spinal stenosis, lumbosacral region: Secondary | ICD-10-CM

## 2021-02-02 DIAGNOSIS — F1120 Opioid dependence, uncomplicated: Secondary | ICD-10-CM

## 2021-02-02 DIAGNOSIS — M5137 Other intervertebral disc degeneration, lumbosacral region: Secondary | ICD-10-CM

## 2021-02-02 DIAGNOSIS — G8929 Other chronic pain: Secondary | ICD-10-CM

## 2021-02-02 MED FILL — Amlodipine Besylate Tab 10 MG (Base Equivalent): ORAL | 90 days supply | Qty: 90 | Fill #0 | Status: AC

## 2021-02-03 ENCOUNTER — Other Ambulatory Visit (HOSPITAL_COMMUNITY): Payer: Self-pay

## 2021-02-03 ENCOUNTER — Telehealth: Payer: Self-pay | Admitting: Nurse Practitioner

## 2021-02-03 ENCOUNTER — Other Ambulatory Visit: Payer: Self-pay | Admitting: Family

## 2021-02-03 DIAGNOSIS — M5137 Other intervertebral disc degeneration, lumbosacral region: Secondary | ICD-10-CM

## 2021-02-03 DIAGNOSIS — G8929 Other chronic pain: Secondary | ICD-10-CM

## 2021-02-03 DIAGNOSIS — M4807 Spinal stenosis, lumbosacral region: Secondary | ICD-10-CM

## 2021-02-03 DIAGNOSIS — F1120 Opioid dependence, uncomplicated: Secondary | ICD-10-CM

## 2021-02-03 MED ORDER — HYDROCODONE-ACETAMINOPHEN 7.5-325 MG PO TABS
1.0000 | ORAL_TABLET | Freq: Three times a day (TID) | ORAL | 0 refills | Status: DC
  Filled 2021-02-03: qty 90, 30d supply, fill #0

## 2021-02-03 NOTE — Telephone Encounter (Signed)
Are you willing to fill this for patient?  PMP database states last refills are  01/05/21 12/04/20 11/04/20  Qty 90

## 2021-02-03 NOTE — Telephone Encounter (Signed)
Pt is asking for [facility] nurse to return her call asap. Its regarding her [place] rx. She thought it was going to be refilled immediately after her first request today. Advised patient I would send a message back but Baldo Ash was out of the office this week and refills can take 48-72 hours to address.  Her call back # [phone]

## 2021-02-03 NOTE — Telephone Encounter (Signed)
What is the name of the medication? HYDROcodone-acetaminophen (NORCO) 7.5-325 MG tablet [[id number]]   Have you contacted your pharmacy to request a refill?  Yes, she needs a refill  Which pharmacy would you like this sent to? [facility]  [address] [place] [address]  Phone:  [phone]  Fax:  [phone]  DEA #:  [id number]     Patient notified that their request is being sent to the clinical staff for review and that they should receive a call once it is complete. If they do not receive a call within 72 hours they can check with their pharmacy or our office.

## 2021-02-04 ENCOUNTER — Other Ambulatory Visit (HOSPITAL_COMMUNITY): Payer: Self-pay

## 2021-02-04 NOTE — Telephone Encounter (Signed)
LVM informing patient Rx has been sent in.

## 2021-02-16 DIAGNOSIS — Z1231 Encounter for screening mammogram for malignant neoplasm of breast: Secondary | ICD-10-CM | POA: Diagnosis not present

## 2021-02-16 DIAGNOSIS — Z6835 Body mass index (BMI) 35.0-35.9, adult: Secondary | ICD-10-CM | POA: Diagnosis not present

## 2021-02-16 DIAGNOSIS — N390 Urinary tract infection, site not specified: Secondary | ICD-10-CM | POA: Diagnosis not present

## 2021-02-16 DIAGNOSIS — Z124 Encounter for screening for malignant neoplasm of cervix: Secondary | ICD-10-CM | POA: Diagnosis not present

## 2021-03-03 ENCOUNTER — Telehealth: Payer: Self-pay | Admitting: Nurse Practitioner

## 2021-03-03 DIAGNOSIS — M4807 Spinal stenosis, lumbosacral region: Secondary | ICD-10-CM

## 2021-03-03 DIAGNOSIS — G8929 Other chronic pain: Secondary | ICD-10-CM

## 2021-03-03 DIAGNOSIS — M5137 Other intervertebral disc degeneration, lumbosacral region: Secondary | ICD-10-CM

## 2021-03-03 DIAGNOSIS — F1120 Opioid dependence, uncomplicated: Secondary | ICD-10-CM

## 2021-03-03 NOTE — Telephone Encounter (Signed)
Refill request for: Hydrocodone/Acet 7.5/325 mg LR  02/03/21, #90, 0 rf LOV 12/04/20     FOV  03/09/21  Please review and advise.   Thanks.  Dm/cma

## 2021-03-03 NOTE — Telephone Encounter (Signed)
Pt requesting refill for Hydrocodone

## 2021-03-05 ENCOUNTER — Telehealth: Payer: Self-pay | Admitting: Nurse Practitioner

## 2021-03-05 ENCOUNTER — Other Ambulatory Visit (HOSPITAL_COMMUNITY): Payer: Self-pay

## 2021-03-05 ENCOUNTER — Other Ambulatory Visit: Payer: Self-pay | Admitting: Family

## 2021-03-05 DIAGNOSIS — F1120 Opioid dependence, uncomplicated: Secondary | ICD-10-CM

## 2021-03-05 DIAGNOSIS — M4807 Spinal stenosis, lumbosacral region: Secondary | ICD-10-CM

## 2021-03-05 DIAGNOSIS — G8929 Other chronic pain: Secondary | ICD-10-CM

## 2021-03-05 DIAGNOSIS — M5137 Other intervertebral disc degeneration, lumbosacral region: Secondary | ICD-10-CM

## 2021-03-05 MED ORDER — HYDROCODONE-ACETAMINOPHEN 7.5-325 MG PO TABS
1.0000 | ORAL_TABLET | Freq: Three times a day (TID) | ORAL | 0 refills | Status: DC
  Filled 2021-03-05: qty 90, 30d supply, fill #0

## 2021-03-05 NOTE — Telephone Encounter (Signed)
Pt calling to follow up on this, she said she takes her last pill tonight

## 2021-03-05 NOTE — Telephone Encounter (Signed)
Pt is checking on the status of her HYDROcodone-acetaminophen (NORCO) 7.5-325 MG tablet [id number] refill. She wants to make sure she gets this before the weekend. There is a request already in from the pharmacy.

## 2021-03-09 ENCOUNTER — Ambulatory Visit: Payer: Medicare Other | Admitting: Nurse Practitioner

## 2021-03-09 ENCOUNTER — Encounter: Payer: Self-pay | Admitting: Internal Medicine

## 2021-03-17 ENCOUNTER — Other Ambulatory Visit: Payer: Self-pay

## 2021-03-17 ENCOUNTER — Other Ambulatory Visit (HOSPITAL_COMMUNITY): Payer: Self-pay

## 2021-03-17 ENCOUNTER — Encounter: Payer: Self-pay | Admitting: Nurse Practitioner

## 2021-03-17 ENCOUNTER — Ambulatory Visit (INDEPENDENT_AMBULATORY_CARE_PROVIDER_SITE_OTHER): Payer: Medicare Other | Admitting: Nurse Practitioner

## 2021-03-17 ENCOUNTER — Ambulatory Visit: Payer: Medicare Other | Admitting: Nurse Practitioner

## 2021-03-17 VITALS — BP 128/80 | HR 60 | Temp 97.9°F | Ht 65.0 in | Wt 208.2 lb

## 2021-03-17 DIAGNOSIS — G8929 Other chronic pain: Secondary | ICD-10-CM

## 2021-03-17 DIAGNOSIS — M5416 Radiculopathy, lumbar region: Secondary | ICD-10-CM

## 2021-03-17 DIAGNOSIS — I10 Essential (primary) hypertension: Secondary | ICD-10-CM | POA: Diagnosis not present

## 2021-03-17 DIAGNOSIS — R739 Hyperglycemia, unspecified: Secondary | ICD-10-CM | POA: Diagnosis not present

## 2021-03-17 DIAGNOSIS — Z1159 Encounter for screening for other viral diseases: Secondary | ICD-10-CM | POA: Diagnosis not present

## 2021-03-17 DIAGNOSIS — E782 Mixed hyperlipidemia: Secondary | ICD-10-CM

## 2021-03-17 DIAGNOSIS — M5137 Other intervertebral disc degeneration, lumbosacral region: Secondary | ICD-10-CM

## 2021-03-17 DIAGNOSIS — M4807 Spinal stenosis, lumbosacral region: Secondary | ICD-10-CM

## 2021-03-17 DIAGNOSIS — F1120 Opioid dependence, uncomplicated: Secondary | ICD-10-CM | POA: Diagnosis not present

## 2021-03-17 DIAGNOSIS — I471 Supraventricular tachycardia, unspecified: Secondary | ICD-10-CM

## 2021-03-17 DIAGNOSIS — M51379 Other intervertebral disc degeneration, lumbosacral region without mention of lumbar back pain or lower extremity pain: Secondary | ICD-10-CM

## 2021-03-17 LAB — LIPID PANEL
Cholesterol: 212 mg/dL — ABNORMAL HIGH (ref 0–200)
HDL: 40.6 mg/dL (ref >39.00)
LDL Cholesterol: 138 mg/dL — ABNORMAL HIGH (ref 0–99)
NonHDL: 171.44
Total CHOL/HDL Ratio: 5
Triglycerides: 166 mg/dL — ABNORMAL HIGH (ref 0.0–149.0)
VLDL: 33.2 mg/dL (ref 0.0–40.0)

## 2021-03-17 LAB — BASIC METABOLIC PANEL
BUN: 12 mg/dL (ref 6–23)
CO2: 29 mEq/L (ref 19–32)
Calcium: 9.3 mg/dL (ref 8.4–10.5)
Chloride: 102 mEq/L (ref 96–112)
Creatinine, Ser: 0.68 mg/dL (ref 0.40–1.20)
GFR: 83.58 mL/min (ref >60.00)
Glucose, Bld: 95 mg/dL (ref 70–99)
Potassium: 3.7 mEq/L (ref 3.5–5.1)
Sodium: 138 mEq/L (ref 135–145)

## 2021-03-17 LAB — HEMOGLOBIN A1C: Hgb A1c MFr Bld: 6 % (ref 4.6–6.5)

## 2021-03-17 MED ORDER — GABAPENTIN 100 MG PO CAPS
200.0000 mg | ORAL_CAPSULE | Freq: Every day | ORAL | 3 refills | Status: DC
  Filled 2021-03-17 – 2021-04-01 (×2): qty 90, 45d supply, fill #0

## 2021-03-17 MED ORDER — ATENOLOL 25 MG PO TABS
25.0000 mg | ORAL_TABLET | Freq: Every day | ORAL | 3 refills | Status: DC
  Filled 2021-03-17 – 2021-04-01 (×2): qty 90, 90d supply, fill #0
  Filled 2021-08-06: qty 90, 90d supply, fill #1
  Filled 2021-11-30: qty 90, 90d supply, fill #2

## 2021-03-17 MED ORDER — AMLODIPINE BESYLATE 10 MG PO TABS
10.0000 mg | ORAL_TABLET | Freq: Every day | ORAL | 3 refills | Status: DC
  Filled 2021-03-17: qty 90, fill #0
  Filled 2021-05-21: qty 90, 90d supply, fill #0
  Filled 2021-10-06: qty 90, 90d supply, fill #1
  Filled 2022-01-11: qty 90, 90d supply, fill #2

## 2021-03-17 NOTE — Assessment & Plan Note (Signed)
BP at goal with amlodipine and atenolol. BP Readings from Last 3 Encounters:  03/17/21 128/80  12/04/20 134/60  11/13/20 128/70   Refill sent Repeat bmp

## 2021-03-17 NOTE — Progress Notes (Signed)
Subjective:  Patient ID: Heather Shaw, female    DOB: 1941/07/13  Age: 80 y.o. MRN: [id number]  CC: Follow-up (3 month f/u on DM, HTN, and hyperlipidemia. Pt is fasting. /Pt declines shingles vaccine)  HPI  Chronic pain Stable, able to continue ADLs independently, take hydrocodone 1.'5mg'$  BID. She continues to used gabapentin '200mg'$  at hsprn No fall, no constipation, no syncope, no change in memory, no daytime somnolence Up to date with contract and UDS. Last refills: 01/05/21, 02/04/21, 03/05/21 #90 each. No refill due today  Hyperglycemia Repeat HgbA1c: stable No need for medication at this time  HLD (hyperlipidemia) Refused to take atorvastatin despite aortic atherosclerosis She request for lipid panel to be repeated today Recommend referral to lipid clinic. Waiting for patient's response  Hypertensive disorder BP at goal with amlodipine and atenolol. BP Readings from Last 3 Encounters:  03/17/21 128/80  12/04/20 134/60  11/13/20 128/70   Refill sent Repeat bmp  Reviewed past Medical, Social and Family history today.  Outpatient Medications Prior to Visit  Medication Sig Dispense Refill   diclofenac Sodium (VOLTAREN) 1 % GEL APPLY 2 G TOPICALLY 4 (FOUR) TIMES DAILY. 100 g 0   meclizine (ANTIVERT) 25 MG tablet TAKE 0.5 TABLETS (12.5 MG TOTAL) BY MOUTH 2 (TWO) TIMES DAILY AS NEEDED FOR DIZZINESS. 30 tablet 0   amLODipine (NORVASC) 10 MG tablet TAKE 1 TABLET (10 MG TOTAL) BY MOUTH DAILY. 90 tablet 3   atenolol (TENORMIN) 25 MG tablet Take 1 tablet (25 mg total) by mouth daily. 90 tablet 3   hydrochlorothiazide (HYDRODIURIL) 25 MG tablet Take 1 tablet (25 mg total) by mouth daily. 90 tablet 1   HYDROcodone-acetaminophen (NORCO) 7.5-325 MG tablet Take 1 tablet by mouth every 8 (eight) hours. Maintain upcoming appt for additonal refills 90 tablet 0   fluocinonide-emollient (LIDEX-E) 0.05 % cream Apply 1 application topically 2 (two) times daily. 30 g 0   Meth-Hyo-M Bl-Na  Phos-Ph Sal (URO-MP) 118 MG CAPS Take 1 capsule by mouth 3 (three) times daily as needed.     amoxicillin (AMOXIL) 500 MG capsule Take 1,000 mg by mouth 2 (two) times daily. (Patient not taking: Reported on 03/17/2021)     gabapentin (NEURONTIN) 100 MG capsule Take 1 capsule (100 mg total) by mouth at bedtime for 7 days, THEN 1 capsule (100 mg total) 2 (two) times daily for 7 days, THEN 1 capsule (100 mg total) 3 (three) times daily. 270 capsule 1   No facility-administered medications prior to visit.   ROS See HPI  Objective:  BP 128/80 (BP Location: Left Arm, Patient Position: Sitting, Cuff Size: Large)   Pulse 60   Temp 97.9 F (36.6 C) (Temporal)   Ht '5\' 5"'$  (1.651 m)   Wt 208 lb 3.2 oz (94.4 kg)   SpO2 97%   BMI 34.65 kg/m   Physical Exam Constitutional:      Appearance: She is obese.  Cardiovascular:     Rate and Rhythm: Normal rate.     Pulses: Normal pulses.          Dorsalis pedis pulses are 2+ on the right side and 2+ on the left side.       Posterior tibial pulses are 2+ on the right side and 2+ on the left side.     Heart sounds: Normal heart sounds.  Pulmonary:     Effort: Pulmonary effort is normal.     Breath sounds: Normal breath sounds.  Musculoskeletal:     Right  lower leg: No edema.     Left lower leg: No edema.  Feet:     Right foot:     Skin integrity: Skin integrity normal.     Left foot:     Skin integrity: Skin integrity normal.  Neurological:     Mental Status: She is alert and oriented to person, place, and time.  Psychiatric:        Mood and Affect: Mood normal.        Behavior: Behavior normal.        Thought Content: Thought content normal.   Assessment & Plan:  This visit occurred during the SARS-CoV-2 public health emergency.  Safety protocols were in place, including screening questions prior to the visit, additional usage of staff PPE, and extensive cleaning of exam room while observing appropriate contact time as indicated for  disinfecting solutions.   Heather Shaw was seen today for follow-up.  Diagnoses and all orders for this visit:  Other chronic pain -     gabapentin (NEURONTIN) 100 MG capsule; Take 2 capsules (200 mg total) by mouth at bedtime. -     HYDROcodone-acetaminophen (NORCO) 7.5-325 MG tablet; Take 1 tablet by mouth every 8 (eight) hours.  Mixed hyperlipidemia -     Lipid panel  Opioid type dependence, continuous (HCC) -     gabapentin (NEURONTIN) 100 MG capsule; Take 2 capsules (200 mg total) by mouth at bedtime. -     HYDROcodone-acetaminophen (NORCO) 7.5-325 MG tablet; Take 1 tablet by mouth every 8 (eight) hours.  Hyperglycemia -     Hemoglobin A1c  Primary hypertension -     Basic metabolic panel  Encounter for hepatitis C screening test for low risk patient -     Hepatitis C Antibody  DDD (degenerative disc disease), lumbosacral -     gabapentin (NEURONTIN) 100 MG capsule; Take 2 capsules (200 mg total) by mouth at bedtime. -     HYDROcodone-acetaminophen (NORCO) 7.5-325 MG tablet; Take 1 tablet by mouth every 8 (eight) hours.  Lumbar radiculopathy, chronic -     gabapentin (NEURONTIN) 100 MG capsule; Take 2 capsules (200 mg total) by mouth at bedtime.  Opioid type dependence, continuous (HCC) -     gabapentin (NEURONTIN) 100 MG capsule; Take 2 capsules (200 mg total) by mouth at bedtime. -     HYDROcodone-acetaminophen (NORCO) 7.5-325 MG tablet; Take 1 tablet by mouth every 8 (eight) hours.  Essential hypertension -     amLODipine (NORVASC) 10 MG tablet; Take 1 tablet (10 mg total) by mouth daily. -     atenolol (TENORMIN) 25 MG tablet; Take 1 tablet (25 mg total) by mouth daily. -     hydrochlorothiazide (HYDRODIURIL) 25 MG tablet; Take 1 tablet (25 mg total) by mouth daily.  SVT (supraventricular tachycardia) (HCC) -     atenolol (TENORMIN) 25 MG tablet; Take 1 tablet (25 mg total) by mouth daily.  Spinal stenosis of lumbosacral region -     HYDROcodone-acetaminophen (NORCO)  7.5-325 MG tablet; Take 1 tablet by mouth every 8 (eight) hours.   Problem List Items Addressed This Visit       Cardiovascular and Mediastinum   Hypertensive disorder    BP at goal with amlodipine and atenolol. BP Readings from Last 3 Encounters:  03/17/21 128/80  12/04/20 134/60  11/13/20 128/70   Refill sent Repeat bmp      Relevant Medications   amLODipine (NORVASC) 10 MG tablet   atenolol (TENORMIN) 25 MG tablet  hydrochlorothiazide (HYDRODIURIL) 25 MG tablet   Other Relevant Orders   Basic metabolic panel (Completed)   SVT (supraventricular tachycardia) (HCC)   Relevant Medications   amLODipine (NORVASC) 10 MG tablet   atenolol (TENORMIN) 25 MG tablet   hydrochlorothiazide (HYDRODIURIL) 25 MG tablet     Nervous and Auditory   Lumbar radiculopathy, chronic   Relevant Medications   gabapentin (NEURONTIN) 100 MG capsule     Musculoskeletal and Integument   DDD (degenerative disc disease), lumbosacral   Relevant Medications   gabapentin (NEURONTIN) 100 MG capsule   HYDROcodone-acetaminophen (NORCO) 7.5-325 MG tablet (Start on 04/06/2021)     Other   Chronic pain - Primary    Stable, able to continue ADLs independently, take hydrocodone 1.'5mg'$  BID. She continues to used gabapentin '200mg'$  at hsprn No fall, no constipation, no syncope, no change in memory, no daytime somnolence Up to date with contract and UDS. Last refills: 01/05/21, 02/04/21, 03/05/21 #90 each. No refill due today      Relevant Medications   gabapentin (NEURONTIN) 100 MG capsule   HYDROcodone-acetaminophen (NORCO) 7.5-325 MG tablet (Start on 04/06/2021)   HLD (hyperlipidemia)    Refused to take atorvastatin despite aortic atherosclerosis She request for lipid panel to be repeated today Recommend referral to lipid clinic. Waiting for patient's response      Relevant Medications   amLODipine (NORVASC) 10 MG tablet   atenolol (TENORMIN) 25 MG tablet   hydrochlorothiazide (HYDRODIURIL) 25 MG  tablet   Other Relevant Orders   Lipid panel (Completed)   Hyperglycemia    Repeat HgbA1c: stable No need for medication at this time      Relevant Orders   Hemoglobin A1c (Completed)   Opioid type dependence, continuous (HCC)   Relevant Medications   gabapentin (NEURONTIN) 100 MG capsule   HYDROcodone-acetaminophen (NORCO) 7.5-325 MG tablet (Start on 04/06/2021)   Other Visit Diagnoses     Encounter for hepatitis C screening test for low risk patient       Relevant Orders   Hepatitis C Antibody (Completed)   Essential hypertension       Relevant Medications   amLODipine (NORVASC) 10 MG tablet   atenolol (TENORMIN) 25 MG tablet   hydrochlorothiazide (HYDRODIURIL) 25 MG tablet   Spinal stenosis of lumbosacral region       Relevant Medications   HYDROcodone-acetaminophen (NORCO) 7.5-325 MG tablet (Start on 04/06/2021)        Follow-up: Return in about 3 months (around 06/17/2021) for chronic pain (27mn, video).  CWilfred Lacy NP

## 2021-03-17 NOTE — Assessment & Plan Note (Addendum)
Repeat HgbA1c: stable No need for medication at this time

## 2021-03-17 NOTE — Assessment & Plan Note (Addendum)
Stable, able to continue ADLs independently, take hydrocodone 1.'5mg'$  BID. She continues to used gabapentin '200mg'$  at hsprn No fall, no constipation, no syncope, no change in memory, no daytime somnolence Up to date with contract and UDS. Last refills: 01/05/21, 02/04/21, 03/05/21 #90 each. No refill due today

## 2021-03-17 NOTE — Patient Instructions (Signed)
Maintain current medications Go to lab for blood draw

## 2021-03-17 NOTE — Assessment & Plan Note (Addendum)
Refused to take atorvastatin despite aortic atherosclerosis She request for lipid panel to be repeated today Recommend referral to lipid clinic. Waiting for patient's response

## 2021-03-18 ENCOUNTER — Other Ambulatory Visit (HOSPITAL_COMMUNITY): Payer: Self-pay

## 2021-03-18 LAB — HEPATITIS C ANTIBODY
Hepatitis C Ab: NONREACTIVE
SIGNAL TO CUT-OFF: 0.01 (ref <1.00)

## 2021-03-18 MED ORDER — HYDROCODONE-ACETAMINOPHEN 7.5-325 MG PO TABS
1.0000 | ORAL_TABLET | Freq: Three times a day (TID) | ORAL | 0 refills | Status: DC
  Filled 2021-04-06: qty 90, 30d supply, fill #0

## 2021-03-18 MED ORDER — HYDROCHLOROTHIAZIDE 25 MG PO TABS
25.0000 mg | ORAL_TABLET | Freq: Every day | ORAL | 1 refills | Status: DC
Start: 2021-03-18 — End: 2022-04-13
  Filled 2021-03-18 – 2021-04-01 (×2): qty 90, 90d supply, fill #0

## 2021-03-20 ENCOUNTER — Telehealth: Payer: Self-pay | Admitting: Nurse Practitioner

## 2021-03-20 NOTE — Telephone Encounter (Signed)
Pt called stating she had a msg from Heather Shaw and was calling back

## 2021-03-25 ENCOUNTER — Other Ambulatory Visit (HOSPITAL_COMMUNITY): Payer: Self-pay

## 2021-03-26 NOTE — Telephone Encounter (Signed)
Handled via lab result notes

## 2021-03-27 ENCOUNTER — Other Ambulatory Visit (HOSPITAL_COMMUNITY): Payer: Self-pay

## 2021-03-27 DIAGNOSIS — B9689 Other specified bacterial agents as the cause of diseases classified elsewhere: Secondary | ICD-10-CM | POA: Diagnosis not present

## 2021-03-27 DIAGNOSIS — R300 Dysuria: Secondary | ICD-10-CM | POA: Diagnosis not present

## 2021-03-27 DIAGNOSIS — J029 Acute pharyngitis, unspecified: Secondary | ICD-10-CM | POA: Diagnosis not present

## 2021-03-27 DIAGNOSIS — H66001 Acute suppurative otitis media without spontaneous rupture of ear drum, right ear: Secondary | ICD-10-CM | POA: Diagnosis not present

## 2021-03-27 DIAGNOSIS — U071 COVID-19: Secondary | ICD-10-CM | POA: Diagnosis not present

## 2021-03-27 DIAGNOSIS — T783XXA Angioneurotic edema, initial encounter: Secondary | ICD-10-CM

## 2021-03-27 DIAGNOSIS — J019 Acute sinusitis, unspecified: Secondary | ICD-10-CM | POA: Diagnosis not present

## 2021-03-27 DIAGNOSIS — A499 Bacterial infection, unspecified: Secondary | ICD-10-CM | POA: Diagnosis not present

## 2021-03-27 DIAGNOSIS — N390 Urinary tract infection, site not specified: Secondary | ICD-10-CM | POA: Diagnosis not present

## 2021-03-27 HISTORY — DX: Angioneurotic edema, initial encounter: T78.3XXA

## 2021-03-31 ENCOUNTER — Telehealth (INDEPENDENT_AMBULATORY_CARE_PROVIDER_SITE_OTHER): Payer: Medicare Other | Admitting: Family Medicine

## 2021-03-31 ENCOUNTER — Other Ambulatory Visit (HOSPITAL_COMMUNITY): Payer: Self-pay

## 2021-03-31 ENCOUNTER — Encounter: Payer: Self-pay | Admitting: Family Medicine

## 2021-03-31 VITALS — BP 177/88 | HR 66 | Temp 98.5°F

## 2021-03-31 DIAGNOSIS — U071 COVID-19: Secondary | ICD-10-CM

## 2021-03-31 MED ORDER — MOLNUPIRAVIR EUA 200MG CAPSULE: 4.0000 | ORAL_CAPSULE | Freq: Two times a day (BID) | ORAL | 0 refills | Status: AC

## 2021-03-31 NOTE — Patient Instructions (Addendum)
HOME CARE TIPS:  -I sent the medication(s) we discussed to your pharmacy: Meds ordered this encounter  Medications   molnupiravir EUA 200 mg CAPS    Sig: Take 4 capsules (800 mg total) by mouth 2 (two) times daily for 5 days.    Dispense:  40 capsule    Refill:  0     -I sent in the Covid19 treatment or referral you requested per our discussion. Please see the information provided below and discuss further with the pharmacist/treatment team.   -If taking an antiviral. there is a chance of rebound illness after finishing your treatment. If you become sick again please isolate for an additional 5 days.   -can use tylenol needed for fevers, aches and pains per instructions  -can use nasal saline a few times per day if you have nasal congestion  -stay hydrated, drink plenty of fluids and eat small healthy meals - avoid dairy  -If the Covid test is positive, check out the [facility] website for more information on home care, transmission and treatment for COVID19  -follow up with your doctor in 2-3 days unless improving and feeling better  -stay home while sick, except to seek medical care. If you have COVID19, ideally it would be best to stay home for a full 10 days since the onset of symptoms PLUS one day of no fever and feeling better. Wear a good mask that fits snugly (such as N95 or KN95) if around others to reduce the risk of transmission.  It was nice to meet you today, and I really hope you are feeling better soon. I help [facility] out with telemedicine visits on Tuesdays and Thursdays and am available for visits on those days. If you have any concerns or questions following this visit please schedule a follow up visit with your Primary Care doctor or seek care at a local urgent care clinic to avoid delays in care.     Fact Sheet for Patients And Caregivers Emergency Use Authorization (EUA) Of LAGEVRIO (molnupiravir) capsules For Coronavirus Disease 2019 (COVID-19)  What is the most  important information I should know about LAGEVRIO? LAGEVRIO may cause serious side effects, including: ? LAGEVRIO may cause harm to your unborn baby. It is not known if LAGEVRIO will harm your baby if you take LAGEVRIO during pregnancy. o LAGEVRIO is not recommended for use in pregnancy. o LAGEVRIO has not been studied in pregnancy. LAGEVRIO was studied in pregnant animals only. When LAGEVRIO was given to pregnant animals, LAGEVRIO caused harm to their unborn babies. o You and your healthcare provider may decide that you should take LAGEVRIO during pregnancy if there are no other COVID-19 treatment options approved or authorized by the FDA that are accessible or clinically appropriate for you. o If you and your healthcare provider decide that you should take LAGEVRIO during pregnancy, you and your healthcare provider should discuss the known and potential benefits and the potential risks of taking LAGEVRIO during pregnancy. For individuals who are able to become pregnant: ? You should use a reliable method of birth control (contraception) consistently and correctly during treatment with LAGEVRIO and for 4 days after the last dose of LAGEVRIO. Talk to your healthcare provider about reliable birth control methods. ? Before starting treatment with [facility] your healthcare provider may do a pregnancy test to see if you are pregnant before starting treatment with LAGEVRIO. ? Tell your healthcare provider right away if you become pregnant or think you may be pregnant during treatment with  LAGEVRIO. Pregnancy Surveillance Program: ? There is a pregnancy surveillance program for individuals who take LAGEVRIO during pregnancy. The purpose of this program is to collect information about the health of you and your baby. Talk to your healthcare provider about how to take part in this program. ? If you take LAGEVRIO during pregnancy and you agree to participate in the pregnancy surveillance program  and allow your healthcare provider to share your information with [employer], then your healthcare provider will report your use of LAGEVRIO during pregnancy to [employer] by calling [phone] or RelayImage.ca. For individuals who are sexually active with partners who are able to become pregnant: ? It is not known if LAGEVRIO can affect sperm. While the risk is regarded as low, animal studies to fully assess the potential for LAGEVRIO to affect the babies of males treated with LAGEVRIO have not been completed. A reliable method of birth control (contraception) should be used consistently and correctly during treatment with LAGEVRIO and for at least 3 months after the last dose. The risk to sperm beyond 3 months is not known. Studies to understand the risk to sperm beyond 3 months are ongoing. Talk to your healthcare provider about reliable birth control methods. Talk to your healthcare provider if you have questions or concerns about how LAGEVRIO may affect sperm. You are being given this fact sheet because your healthcare provider believes it is necessary to provide you with LAGEVRIO for the treatment of adults with mild-to-moderate coronavirus disease 2019 (COVID-19) with positive results of direct SARS-CoV-2 viral testing, and who are at high risk for progression to severe COVID-19 including hospitalization or death, and for whom other COVID-19 treatment options approved or authorized by the FDA are not accessible or clinically appropriate. The U.S. Food and Drug Administration (FDA) has issued an Emergency Use Authorization (EUA) to make LAGEVRIO available during the COVID-19 pandemic (for more details about an EUA please see What is an Emergency Use Authorization? at the end of this document). LAGEVRIO is not an FDA-approved medicine in the [place]. Read this Fact Sheet for information about LAGEVRIO. Talk to your healthcare provider about  your options if you have any questions. It is your choice to take LAGEVRIO.  What is COVID-19? COVID-19 is caused by a virus called a coronavirus. You can get COVID-19 through close contact with another person who has the virus. COVID-19 illnesses have ranged from very mild-to-severe, including illness resulting in death. While information so far suggests that most COVID-19 illness is mild, serious illness can happen and may cause some of your other medical conditions to become worse. Older people and people of all ages with severe, long lasting (chronic) medical conditions like heart disease, lung disease and diabetes, for example seem to be at higher risk of being hospitalized for COVID-19.  What is LAGEVRIO? LAGEVRIO is an investigational medicine used to treat mild-to-moderate COVID-19 in adults: ? with positive results of direct SARS-CoV-2 viral testing, and ? who are at high risk for progression to severe COVID-19 including hospitalization or death, and for whom other COVID-19 treatment options approved or authorized by the FDA are not accessible or clinically appropriate. The FDA has authorized the emergency use of LAGEVRIO for the treatment of mild-tomoderate COVID-19 in adults under an EUA. For more information on EUA, see the What is an Emergency Use Authorization (EUA)? section at the end of this Fact Sheet. LAGEVRIO is not authorized: ? for use in people less than 18  years of age. ? for prevention of COVID-19. ? for people needing hospitalization for COVID-19. ? for use for longer than 5 consecutive days.  What should I tell my healthcare provider before I take LAGEVRIO? Tell your healthcare provider if you: ? Have any allergies ? Are breastfeeding or plan to breastfeed ? Have any serious illnesses ? Are taking any medicines (prescription, over-the-counter, vitamins, or herbal products).  How do I take LAGEVRIO? ? Take LAGEVRIO exactly as your healthcare provider  tells you to take it. ? Take 4 capsules of LAGEVRIO every 12 hours (for example, at 8 am and at 8 pm) ? Take LAGEVRIO for 5 days. It is important that you complete the full 5 days of treatment with LAGEVRIO. Do not stop taking LAGEVRIO before you complete the full 5 days of treatment, even if you feel better. ? Take LAGEVRIO with or without food. ? You should stay in isolation for as long as your healthcare provider tells you to. Talk to your healthcare provider if you are not sure about how to properly isolate while you have COVID-19. ? Swallow LAGEVRIO capsules whole. Do not open, break, or crush the capsules. If you cannot swallow capsules whole, tell your healthcare provider. ? What to do if you miss a dose: o If it has been less than 10 hours since the missed dose, take it as soon as you remember o If it has been more than 10 hours since the missed dose, skip the missed dose and take your dose at the next scheduled time. ? Do not double the dose of LAGEVRIO to make up for a missed dose.  What are the important possible side effects of LAGEVRIO? ? See, What is the most important information I should know about LAGEVRIO? ? Allergic Reactions. Allergic reactions can happen in people taking LAGEVRIO, even after only 1 dose. Stop taking LAGEVRIO and call your healthcare provider right away if you get any of the following symptoms of an allergic reaction: o hives o rapid heartbeat o trouble swallowing or breathing o swelling of the mouth, lips, or face o throat tightness o hoarseness o skin rash The most common side effects of LAGEVRIO are: ? diarrhea ? nausea ? dizziness These are not all the possible side effects of LAGEVRIO. Not many people have taken LAGEVRIO. Serious and unexpected side effects may happen. This medicine is still being studied, so it is possible that all of the risks are not known at this time.  What other treatment choices are there?  Veklury (remdesivir)  is FDA-approved as an intravenous (IV) infusion for the treatment of mildto-moderate COVID-19 in certain adults and children. Talk with your doctor to see if Elias Else is appropriate for you. Like LAGEVRIO, FDA may also allow for the emergency use of other medicines to treat people with COVID-19. Go to http://www.austin-beck.biz/ for more information. It is your choice to be treated or not to be treated with LAGEVRIO. Should you decide not to take it, it will not change your standard medical care.  What if I am breastfeeding? Breastfeeding is not recommended during treatment with LAGEVRIO and for 4 days after the last dose of LAGEVRIO. If you are breastfeeding or plan to breastfeed, talk to your healthcare provider about your options and specific situation before taking LAGEVRIO.  How do I report side effects with LAGEVRIO? Contact your healthcare provider if you have any side effects that bother you or do not go away. Report side effects to FDA MedWatch at  MacRetreat.be or call 1-800-FDA-1088 ((418)072-3527).  How should I store LAGEVRIO? ? Store LAGEVRIO capsules at room temperature between 37F to 35F (20C to 25C). ? Keep LAGEVRIO and all medicines out of the reach of children and pets. How can I learn more about COVID-19? ? Ask your healthcare provider. ? Visit IndexCrawler.co.za ? Contact your local or state public health department. ? Call [employer] at [phone] (toll free in the [place] ? Visit www.molnupiravir.com  What Is an Emergency Use Authorization (EUA)? The [place] FDA has made LAGEVRIO available under an emergency access mechanism called an Emergency Use Authorization (EUA) The EUA is supported by a Insurance account manager Health and Human Service (HHS) declaration that circumstances exist to justify emergency use of drugs and biological products during  the COVID-19 pandemic. LAGEVRIO for the treatment of mild-to-moderate COVID-19 in adults with positive results of direct SARS-CoV-2 viral testing, who are at high risk for progression to severe COVID-19, including hospitalization or death, and for whom alternative COVID-19 treatment options approved or authorized by FDA are not accessible or clinically appropriate, has not undergone the same type of review as an FDA-approved product. In issuing an EUA under the COVID-19 public health emergency, the FDA has determined, among other things, that based on the total amount of scientific evidence available including data from adequate and well-controlled clinical trials, if available, it is reasonable to believe that the product may be effective for diagnosing, treating, or preventing COVID-19, or a serious or life-threatening disease or condition caused by COVID-19; that the known and potential benefits of the product, when used to diagnose, treat, or prevent such disease or condition, outweigh the known and potential risks of such product; and that there are no adequate, approved, and available alternatives.  All of these criteria must be met to allow for the product to be used in the treatment of patients during the COVID-19 pandemic. The EUA for LAGEVRIO is in effect for the duration of the COVID-19 declaration justifying emergency use of LAGEVRIO, unless terminated or revoked (after which LAGEVRIO may no longer be used under the EUA). For patent information: LeaseGuru.tn Copyright  2021-2022 [employer] [place], [place] and its affiliates. All rights reserved. [id number] Revised: March 2022   Seek in person care or schedule a follow up video visit promptly if your symptoms worsen, new concerns arise or you are not improving with treatment. Call 911 and/or seek emergency care if your symptoms are severe or life threatening.

## 2021-03-31 NOTE — Progress Notes (Signed)
Virtual Visit via Video Note  I connected with Stanton Kidney  on 03/31/21 at 11:00 AM EDT by a video enabled telemedicine application and verified that I am speaking with the correct person using two identifiers.  Location patient: home, [place] Location provider:work or home office Persons participating in the virtual visit: patient, provider  I discussed the limitations of evaluation and management by telemedicine and the availability of in person appointments. The patient expressed understanding and agreed to proceed.   HPI:  Acute telemedicine visit for Covid19: -Onset: 3 days ago, tested positive for covid at urgent care and was treated with and abx and cough medication -Symptoms include: sore throat, cough, nasal congestion, fever, nausea, trouble sleeping -doing better today a little -Denies: CP, SOB, vomiting, diarrhea, inability to eat/drink/get out of bed -Pertinent past medical history:see below -Pertinent medication allergies:  Allergies  Allergen Reactions   Contrast Media [Iodinated Diagnostic Agents]     ? angioedema   Lisinopril     ? angioedema   Allegra [Fexofenadine] Other (See Comments)    Makes pt nervous   Codeine Nausea And Vomiting   Nsaids     "stomach on fire feeling"    Prednisone     Causes elevation in blood pressure- ? All steroids cause same reaction   Zithromax [Azithromycin] Other (See Comments)    Burns stomach   Sulfa Antibiotics Rash  -COVID-19 vaccine status: 2 doses  ROS: See pertinent positives and negatives per HPI.  Past Medical History:  Diagnosis Date   Arthritis    Bladder pain    Bradycardia    DDD (degenerative disc disease), lumbosacral    Grief 05/24/2018   Hyperlipidemia    Hyperplastic colon polyp    Hypertension    IC (interstitial cystitis)    Insomnia 04/06/2010   Qualifier: Diagnosis of  By: Deborra Medina MD, Talia     Internal hemorrhoids    Polyp of rectum    PONV (postoperative nausea and vomiting)    Right wrist tendonitis  08/24/2017   Squamous papilloma    in esophageal polyp   Uterine fibroid    Vertigo    Wears glasses    Wears partial dentures     Past Surgical History:  Procedure Laterality Date   COLONOSCOPY W/ POLYPECTOMY  2009   CYSTO WITH HYDRODISTENSION N/A 05/20/2014   Procedure: CYSTOSCOPY/HYDRODISTENSION, INSTILLATION OF CHLORPACTIN;  Surgeon: Bernestine Amass, MD;  Location: [facility];  Service: Urology;  Laterality: N/A;   CYSTO/  HYDRODISTENTION/  INSTILLATION CLORPACTIC  09-21-2010   DILATION AND CURETTAGE OF UTERUS  1988   KNEE ARTHROSCOPY Right 11-18-2009   TRIGGER FINGER RELEASE Right 08/23/2016   Procedure: RIGHT RING FINGER TRIGGER RELEASE;  Surgeon: Milly Jakob, MD;  Location: [place];  Service: Orthopedics;  Laterality: Right;     Current Outpatient Medications:    amLODipine (NORVASC) 10 MG tablet, Take 1 tablet (10 mg total) by mouth daily., Disp: 90 tablet, Rfl: 3   atenolol (TENORMIN) 25 MG tablet, Take 1 tablet (25 mg total) by mouth daily., Disp: 90 tablet, Rfl: 3   cefdinir (OMNICEF) 300 MG capsule, Take 300 mg by mouth 2 (two) times daily., Disp: , Rfl:    diclofenac Sodium (VOLTAREN) 1 % GEL, APPLY 2 G TOPICALLY 4 (FOUR) TIMES DAILY., Disp: 100 g, Rfl: 0   fluocinonide-emollient (LIDEX-E) 0.05 % cream, Apply 1 application topically 2 (two) times daily., Disp: 30 g, Rfl: 0   gabapentin (NEURONTIN) 100 MG capsule,  Take 2 capsules (200 mg total) by mouth at bedtime., Disp: 90 capsule, Rfl: 3   hydrochlorothiazide (HYDRODIURIL) 25 MG tablet, Take 1 tablet (25 mg total) by mouth daily., Disp: 90 tablet, Rfl: 1   [START ON 04/06/2021] HYDROcodone-acetaminophen (NORCO) 7.5-325 MG tablet, Take 1 tablet by mouth every 8 (eight) hours., Disp: 90 tablet, Rfl: 0   meclizine (ANTIVERT) 25 MG tablet, TAKE 0.5 TABLETS (12.5 MG TOTAL) BY MOUTH 2 (TWO) TIMES DAILY AS NEEDED FOR DIZZINESS., Disp: 30 tablet, Rfl: 0   Meth-Hyo-M Bl-Na Phos-Ph Sal (URO-MP)  118 MG CAPS, Take 1 capsule by mouth 3 (three) times daily as needed., Disp: , Rfl:    molnupiravir EUA 200 mg CAPS, Take 4 capsules (800 mg total) by mouth 2 (two) times daily for 5 days., Disp: 40 capsule, Rfl: 0  EXAM:  VITALS per patient if applicable:  GENERAL: alert, oriented, appears well and in no acute distress  HEENT: atraumatic, conjunttiva clear, no obvious abnormalities on inspection of external nose and ears  NECK: normal movements of the head and neck  LUNGS: on inspection no signs of respiratory distress, breathing rate appears normal, no obvious gross SOB, gasping or wheezing  CV: no obvious cyanosis  MS: moves all visible extremities without noticeable abnormality  PSYCH/NEURO: pleasant and cooperative, no obvious depression or anxiety, speech and thought processing grossly intact  ASSESSMENT AND PLAN:  Discussed the following assessment and plan:  COVID-19   Discussed treatment options (infusions and oral options and risk of drug interactions), ideal treatment window, potential complications, isolation and precautions for COVID-19.  Discussed possibility of rebound with antivirals and the need to reisolate if it should occur for 5 days. Checked for/reviewed any labs done in the last 90 days with GFR listed in HPI if available. After lengthy discussion, the patient opted for treatment with molnupiravir due to being higher risk for complications of covid or severe disease and other factors. Discussed EUA status of this drug and the fact that there is preliminary limited knowledge of risks/interactions/side effects per EUA document vs possible benefits and precautions. This information was shared with patient during the visit and also was provided in patient instructions. Also, advised that patient discuss risks/interactions and use with pharmacist/treatment team as well.   Other symptomatic care measures summarized in patient instructions. Advised to seek prompt in person  care if worsening, new symptoms arise, or if is not improving with treatment. Discussed options for inperson care if PCP office not available. Did let this patient know that I only do telemedicine on Tuesdays and Thursdays for [place]. Advised to schedule follow up visit with PCP or [facility] if any further questions or concerns to avoid delays in care.   I discussed the assessment and treatment plan with the patient. The patient was provided an opportunity to ask questions and all were answered. The patient agreed with the plan and demonstrated an understanding of the instructions.     Lucretia Kern, DO

## 2021-04-01 ENCOUNTER — Emergency Department (HOSPITAL_COMMUNITY): Payer: Medicare Other

## 2021-04-01 ENCOUNTER — Emergency Department (HOSPITAL_COMMUNITY)
Admission: EM | Admit: 2021-04-01 | Discharge: 2021-04-02 | Disposition: A | Discharge status: Home / Self Care | Payer: Medicare Other | Attending: Emergency Medicine | Admitting: Emergency Medicine

## 2021-04-01 ENCOUNTER — Other Ambulatory Visit: Payer: Self-pay

## 2021-04-01 ENCOUNTER — Encounter (HOSPITAL_COMMUNITY): Payer: Self-pay | Admitting: *Deleted

## 2021-04-01 ENCOUNTER — Other Ambulatory Visit (HOSPITAL_COMMUNITY): Payer: Self-pay

## 2021-04-01 DIAGNOSIS — U071 COVID-19: Secondary | ICD-10-CM | POA: Insufficient documentation

## 2021-04-01 DIAGNOSIS — I10 Essential (primary) hypertension: Secondary | ICD-10-CM | POA: Diagnosis not present

## 2021-04-01 DIAGNOSIS — Z87891 Personal history of nicotine dependence: Secondary | ICD-10-CM | POA: Diagnosis not present

## 2021-04-01 DIAGNOSIS — N8110 Cystocele, unspecified: Secondary | ICD-10-CM | POA: Diagnosis not present

## 2021-04-01 DIAGNOSIS — N3010 Interstitial cystitis (chronic) without hematuria: Secondary | ICD-10-CM | POA: Diagnosis not present

## 2021-04-01 DIAGNOSIS — M47816 Spondylosis without myelopathy or radiculopathy, lumbar region: Secondary | ICD-10-CM | POA: Diagnosis not present

## 2021-04-01 DIAGNOSIS — I862 Pelvic varices: Secondary | ICD-10-CM | POA: Diagnosis not present

## 2021-04-01 DIAGNOSIS — K429 Umbilical hernia without obstruction or gangrene: Secondary | ICD-10-CM | POA: Diagnosis not present

## 2021-04-01 DIAGNOSIS — K5900 Constipation, unspecified: Secondary | ICD-10-CM | POA: Insufficient documentation

## 2021-04-01 DIAGNOSIS — R1030 Lower abdominal pain, unspecified: Secondary | ICD-10-CM | POA: Insufficient documentation

## 2021-04-01 DIAGNOSIS — Z79899 Other long term (current) drug therapy: Secondary | ICD-10-CM | POA: Insufficient documentation

## 2021-04-01 DIAGNOSIS — R300 Dysuria: Secondary | ICD-10-CM | POA: Diagnosis present

## 2021-04-01 DIAGNOSIS — G8929 Other chronic pain: Secondary | ICD-10-CM | POA: Diagnosis not present

## 2021-04-01 DIAGNOSIS — R1084 Generalized abdominal pain: Secondary | ICD-10-CM | POA: Diagnosis not present

## 2021-04-01 LAB — CBC WITH DIFFERENTIAL/PLATELET
Abs Immature Granulocytes: 0.01 10*3/uL (ref 0.00–0.07)
Basophils Absolute: 0 10*3/uL (ref 0.0–0.1)
Basophils Relative: 0 %
Eosinophils Absolute: 0 10*3/uL (ref 0.0–0.5)
Eosinophils Relative: 0 %
HCT: 44.3 % (ref 36.0–46.0)
Hemoglobin: 15.4 g/dL — ABNORMAL HIGH (ref 12.0–15.0)
Immature Granulocytes: 0 %
Lymphocytes Relative: 56 %
Lymphs Abs: 2.5 10*3/uL (ref 0.7–4.0)
MCH: 29 pg (ref 26.0–34.0)
MCHC: 34.8 g/dL (ref 30.0–36.0)
MCV: 83.4 fL (ref 80.0–100.0)
Monocytes Absolute: 0.4 10*3/uL (ref 0.1–1.0)
Monocytes Relative: 10 %
Neutro Abs: 1.6 10*3/uL — ABNORMAL LOW (ref 1.7–7.7)
Neutrophils Relative %: 34 %
Platelets: 174 10*3/uL (ref 150–400)
RBC: 5.31 MIL/uL — ABNORMAL HIGH (ref 3.87–5.11)
RDW: 12.3 % (ref 11.5–15.5)
WBC: 4.6 10*3/uL (ref 4.0–10.5)
nRBC: 0 % (ref 0.0–0.2)

## 2021-04-01 LAB — COMPREHENSIVE METABOLIC PANEL
ALT: 25 U/L (ref 0–44)
AST: 28 U/L (ref 15–41)
Albumin: 4 g/dL (ref 3.5–5.0)
Alkaline Phosphatase: 42 U/L (ref 38–126)
Anion gap: 10 (ref 5–15)
BUN: 10 mg/dL (ref 8–23)
CO2: 26 mmol/L (ref 22–32)
Calcium: 9.3 mg/dL (ref 8.9–10.3)
Chloride: 96 mmol/L — ABNORMAL LOW (ref 98–111)
Creatinine, Ser: 0.64 mg/dL (ref 0.44–1.00)
GFR, Estimated: >60 mL/min (ref >60)
Glucose, Bld: 122 mg/dL — ABNORMAL HIGH (ref 70–99)
Potassium: 3.3 mmol/L — ABNORMAL LOW (ref 3.5–5.1)
Sodium: 132 mmol/L — ABNORMAL LOW (ref 135–145)
Total Bilirubin: 0.7 mg/dL (ref 0.3–1.2)
Total Protein: 7.3 g/dL (ref 6.5–8.1)

## 2021-04-01 NOTE — ED Triage Notes (Signed)
The pt is c/o abd pain and possible uti  she was positive for covid Saturday  she had uti sympoms before that for 2-3 days  no temp

## 2021-04-01 NOTE — ED Notes (Signed)
Called pt for vitals no response

## 2021-04-01 NOTE — ED Notes (Signed)
Called pt for vitals no response.

## 2021-04-01 NOTE — ED Provider Notes (Signed)
Emergency Medicine Provider Triage Evaluation Note  Heather Shaw , a 80 y.o. female  was evaluated in triage.  Pt complains of lower abdominal pain since Sunday.  Was seen and evaluated urgent care in [place] and was diagnosed with a urinary tract infection and COVID.  Was placed on antibiotics.  Has a history of interstitial cystitis.  Pain is getting worse.  Associated nausea and fever  Review of Systems  Positive: Diarrhea, dysuria, flank pain, urgency Negative: Vomiting, chills   Physical Exam  BP (!) 150/76 (BP Location: Right Arm)   Pulse (!) 53   Temp 98.2 F (36.8 C)   Resp 18   Ht '5\' 5"'$  (1.651 m)   Wt 94.4 kg   SpO2 98%   BMI 34.63 kg/m  Gen:   Awake, no distress   Resp:  Normal effort  MSK:   Moves extremities without difficulty  Other:  Right lower abdominal tenderness, no CVA tenderness  Medical Decision Making  Medically screening exam initiated at 8:09 PM.  Appropriate orders placed.  [name] was informed that the remainder of the evaluation will be completed by another provider, this initial triage assessment does not replace that evaluation, and the importance of remaining in the ED until their evaluation is complete.     Hendricks Limes, PA-C 04/01/21 2011    Wynona Dove A, DO 04/01/21 [id number]

## 2021-04-02 DIAGNOSIS — U071 COVID-19: Secondary | ICD-10-CM | POA: Diagnosis not present

## 2021-04-02 LAB — URINALYSIS, ROUTINE W REFLEX MICROSCOPIC
Bilirubin Urine: NEGATIVE
Glucose, UA: NEGATIVE mg/dL
Hgb urine dipstick: NEGATIVE
Ketones, ur: 5 mg/dL — AB
Leukocytes,Ua: NEGATIVE
Nitrite: NEGATIVE
Protein, ur: NEGATIVE mg/dL
Specific Gravity, Urine: 1.017 (ref 1.005–1.030)
pH: 5 (ref 5.0–8.0)

## 2021-04-02 MED ORDER — PHENAZOPYRIDINE HCL 200 MG PO TABS: 200.0000 mg | ORAL_TABLET | Freq: Three times a day (TID) | ORAL | 0 refills | Status: DC | PRN

## 2021-04-02 NOTE — ED Notes (Signed)
Pt independently ambulatory to restroom. Even steady gait.  Denies feeling dizzy or light headed.  Provider in room upon pt return

## 2021-04-02 NOTE — ED Provider Notes (Signed)
[facility] EMERGENCY DEPARTMENT Provider Note   CSN: [id number] Arrival date & time: 04/01/21  1729     History Chief Complaint  Patient presents with   Abdominal Pain    Heather Shaw is a 80 y.o. female presenting to the ED with complaints of dysuria and suprapubic abdominal pain x 6 days. She was diagnosed with COVID and a UTI on Saturday at urgent care, prescribed cefdinir 300 bid (day 5/10) for the UTI. Dysuria since onset of UTI is non-improving, although she does have chronic dysuria due to her interstitial cystitis.    Abdominal Pain Associated symptoms: constipation, dysuria and fatigue   Associated symptoms: no chest pain, no chills, no cough, no fever, no hematuria and no shortness of breath   Acute on chronic suprapubic abdominal pain in setting of recent UTI, and PMH of interstitial cystitis. Pt states pain ranges from 6-9/10, is constant and describes it as dull and stabbing. She also describes occasional right flank pain. Had a small BM yesterday, but last full BM was Sunday saying "I have not been eating well because of my interstitial cystitis".      Past Medical History:  Diagnosis Date   Arthritis    Bladder pain    Bradycardia    DDD (degenerative disc disease), lumbosacral    Grief 05/24/2018   Hyperlipidemia    Hyperplastic colon polyp    Hypertension    IC (interstitial cystitis)    Insomnia 04/06/2010   Qualifier: Diagnosis of  By: Deborra Medina MD, Talia     Internal hemorrhoids    Polyp of rectum    PONV (postoperative nausea and vomiting)    Right wrist tendonitis 08/24/2017   Squamous papilloma    in esophageal polyp   Uterine fibroid    Vertigo    Wears glasses    Wears partial dentures     Patient Active Problem List   Diagnosis Date Noted   Hyperglycemia 09/25/2020   Atherosclerosis of aorta ([place]) 09/25/2020   Opioid type dependence, continuous ([place]) 08/15/2019   Chronic pain 08/11/2017   Hearing loss 11/23/2016   SVT  (supraventricular tachycardia) (HCC)    Angio-edema    Carpal tunnel syndrome 09/19/2014   Eczema 09/19/2014   Chronic interstitial cystitis 05/20/2014   Lumbar radiculopathy, chronic 12/31/2013   DDD (degenerative disc disease), lumbosacral 04/07/2011   Vitamin D deficiency 10/03/2009   HLD (hyperlipidemia) 07/16/2009   Hypertensive disorder 07/16/2009    Past Surgical History:  Procedure Laterality Date   COLONOSCOPY W/ POLYPECTOMY  2009   CYSTO WITH HYDRODISTENSION N/A 05/20/2014   Procedure: CYSTOSCOPY/HYDRODISTENSION, INSTILLATION OF CHLORPACTIN;  Surgeon: Bernestine Amass, MD;  Location: [facility];  Service: Urology;  Laterality: N/A;   CYSTO/  HYDRODISTENTION/  INSTILLATION CLORPACTIC  09-21-2010   DILATION AND CURETTAGE OF UTERUS  1988   KNEE ARTHROSCOPY Right 11-18-2009   TRIGGER FINGER RELEASE Right 08/23/2016   Procedure: RIGHT RING FINGER TRIGGER RELEASE;  Surgeon: Milly Jakob, MD;  Location: [facility];  Service: Orthopedics;  Laterality: Right;     OB History   No obstetric history on file.     Family History  Problem Relation Age of Onset   Clotting disorder Mother 92   COPD Father    Heart attack Father 82   Emphysema Father    Stroke Paternal Aunt    Hypertension Sister    Hypertension Brother     Social History   Tobacco Use  Smoking status: Former    Types: Cigarettes    Quit date: 05/17/1971    Years since quitting: 49.9   Smokeless tobacco: Never  Substance Use Topics   Alcohol use: No    Alcohol/week: 0.0 standard drinks   Drug use: No    Home Medications Prior to Admission medications   Medication Sig Start Date End Date Taking? Authorizing Provider  amLODipine (NORVASC) 10 MG tablet Take 1 tablet (10 mg total) by mouth daily. 03/17/21   [name], Charlene Brooke, NP  atenolol (TENORMIN) 25 MG tablet Take 1 tablet (25 mg total) by mouth daily. 03/17/21   [name], Charlene Brooke, NP  cefdinir (OMNICEF) 300 MG  capsule Take 300 mg by mouth 2 (two) times daily. 03/27/21   [provider]  diclofenac Sodium (VOLTAREN) 1 % GEL APPLY 2 G TOPICALLY 4 (FOUR) TIMES DAILY. 08/26/20 08/26/21  [name], Charlene Brooke, NP  fluocinonide-emollient (LIDEX-E) 0.05 % cream Apply 1 application topically 2 (two) times daily. 01/09/21   [name], Charlene Brooke, NP  gabapentin (NEURONTIN) 100 MG capsule Take 2 capsules (200 mg total) by mouth at bedtime. 03/17/21   [name], Charlene Brooke, NP  hydrochlorothiazide (HYDRODIURIL) 25 MG tablet Take 1 tablet (25 mg total) by mouth daily. 03/18/21   [name], Charlene Brooke, NP  HYDROcodone-acetaminophen (NORCO) 7.5-325 MG tablet Take 1 tablet by mouth every 8 (eight) hours. 04/06/21   [name], Charlene Brooke, NP  meclizine (ANTIVERT) 25 MG tablet TAKE 0.5 TABLETS (12.5 MG TOTAL) BY MOUTH 2 (TWO) TIMES DAILY AS NEEDED FOR DIZZINESS. 04/18/20 04/18/21  [name], Charlene Brooke, NP  Meth-Hyo-M Barnett Hatter Phos-Ph Sal (URO-MP) 118 MG CAPS Take 1 capsule by mouth 3 (three) times daily as needed. 02/11/21   [provider]  molnupiravir EUA 200 mg CAPS Take 4 capsules (800 mg total) by mouth 2 (two) times daily for 5 days. 03/31/21 04/05/21  Lucretia Kern, DO  atorvastatin (LIPITOR) 20 MG tablet Take 1 tablet (20 mg total) by mouth daily. 09/25/20 09/30/20  [name], Charlene Brooke, NP    Allergies    Contrast media [iodinated diagnostic agents], Lisinopril, Allegra [fexofenadine], Codeine, Nsaids, Prednisone, Zithromax [azithromycin], and Sulfa antibiotics  Review of Systems   Review of Systems  Constitutional:  Positive for fatigue. Negative for chills and fever.  Respiratory:  Negative for cough and shortness of breath.   Cardiovascular:  Negative for chest pain.  Gastrointestinal:  Positive for abdominal pain and constipation.  Genitourinary:  Positive for dysuria and flank pain. Negative for hematuria.  Musculoskeletal:  Positive for myalgias.  All other systems reviewed and are negative.  Physical Exam Updated  Vital Signs BP (!) 154/86   Pulse 77   Temp 98.2 F (36.8 C)   Resp 18   Ht '5\' 5"'$  (1.651 m)   Wt 94.4 kg   SpO2 99%   BMI 34.63 kg/m   Physical Exam Constitutional:      Appearance: She is well-developed.  HENT:     Head: Normocephalic and atraumatic.  Eyes:     Extraocular Movements: Extraocular movements intact.  Cardiovascular:     Rate and Rhythm: Normal rate and regular rhythm.     Heart sounds: Normal heart sounds.  Pulmonary:     Effort: Pulmonary effort is normal.     Breath sounds: Normal breath sounds.  Abdominal:     General: Abdomen is flat. Bowel sounds are normal. There is no distension.     Palpations: Abdomen is soft.     Tenderness: There  is no right CVA tenderness, left CVA tenderness, guarding or rebound. Negative signs include Murphy's sign, Rovsing's sign and McBurney's sign.  Skin:    General: Skin is warm and dry.  Neurological:     General: No focal deficit present.     Mental Status: She is alert and oriented to person, place, and time.  Psychiatric:        Mood and Affect: Mood normal.        Behavior: Behavior normal.    ED Results / Procedures / Treatments   Labs (all labs ordered are listed, but only abnormal results are displayed) Labs Reviewed  COMPREHENSIVE METABOLIC PANEL - Abnormal; Notable for the following components:      Result Value   Sodium 132 (*)    Potassium 3.3 (*)    Chloride 96 (*)    Glucose, Bld 122 (*)    All other components within normal limits  CBC WITH DIFFERENTIAL/PLATELET - Abnormal; Notable for the following components:   RBC 5.31 (*)    Hemoglobin 15.4 (*)    Neutro Abs 1.6 (*)    All other components within normal limits  URINALYSIS, ROUTINE W REFLEX MICROSCOPIC - Abnormal; Notable for the following components:   APPearance HAZY (*)    Ketones, ur 5 (*)    All other components within normal limits  URINE CULTURE    EKG None  Radiology CT ABDOMEN PELVIS WO CONTRAST  Result Date:  04/01/2021 CLINICAL DATA:  Acute abdominal pain.  Nonlocalized pain. EXAM: CT ABDOMEN AND PELVIS WITHOUT CONTRAST TECHNIQUE: Multidetector CT imaging of the abdomen and pelvis was performed following the standard protocol without IV contrast. COMPARISON:  Contrast-enhanced CT 10/03/2015. Prior pelvic imaging including MRI 11/23/2018 FINDINGS: Lower chest: No acute airspace disease or pleural effusion. Hepatobiliary: No focal hepatic abnormality. Unremarkable gallbladder. No biliary dilatation. Pancreas: No ductal dilatation or inflammation. Spleen: Normal in size without focal abnormality. Adrenals/Urinary Tract: Normal adrenal glands. No hydronephrosis, perinephric edema, or renal calculi. Mild thinning of bilateral renal parenchyma. No evidence of focal renal lesion on this unenhanced exam. Minimal cystocele. Urinary bladder otherwise unremarkable. Stomach/Bowel: Decompressed stomach. No bowel obstruction or inflammation. Cecum is high-riding in the mid abdomen. Normal appendix. Small to moderate colonic stool burden. Mild colonic redundancy. Vascular/Lymphatic: Moderate aortic atherosclerosis. No aortic aneurysm. 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 No enlarged lymph nodes in the abdomen or pelvis. Reproductive: Low-density left adnexal lesion measuring approximately 7.2 cm AP dimension, not significantly changed in size from prior MRI. The right adnexal lesion on MRI is not well seen by CT. Stable appearance of the uterus. Other: No ascites or free air. Tiny fat containing umbilical hernia. Musculoskeletal: Multilevel degenerative change in the spine, with Modic endplate changes at 075-GRM. Bones are subjectively under mineralized. There are no acute or suspicious osseous abnormalities. IMPRESSION: 1. No acute abnormality or explanation for abdominal pain. 2. Unchanged left adnexal lesion measuring 7.2 cm. This was previously characterized by MRI and  stable in size. Aortic Atherosclerosis (ICD10-I70.0). Electronically Signed   By: Keith Rake M.D.   On: 04/01/2021 20:47    Procedures Procedures   Medications Ordered in ED Medications - No data to display  ED Course  I have reviewed the triage vital signs and the nursing notes.  Pertinent labs & imaging results that were available during my care of the patient were reviewed by me and considered in my medical decision making (see  chart for details).    MDM Rules/Calculators/A&P                           Pt with chronic suprapubic abdominal pain and dysuria presenting with increasing dysuria and suprapubic abdominal pain and possibly right flank pain, in the setting of a UTI diagnosed on Saturday. Vital stable, pt remains afebrile. CBC without leukocytosis, and non-concerning CMP. Repeat U/A today shows resolution of UTI, with no concerning features. CT abdomen not concerning for pyelonephritis or nephrolithiasis.  Exam benign with no concerning features for pyelonephritis. Symptoms are likely due to aggravation of interstitial cystis in setting of recent UTI. Patient advised to follow up with her urologist who she has been unable to see for over 1 year, to better manage interstitial cystitis given current aggravation of symptoms.   Final Clinical Impression(s) / ED Diagnoses Final diagnoses:  None    Rx / DC Orders ED Discharge Orders     None        Lajean Manes, MD 04/02/21 [id number]    Blanchie Dessert, MD 04/02/21 1158

## 2021-04-02 NOTE — Discharge Instructions (Addendum)
Heather Shaw you seen in the ED because of abdominal pain and right sided back pain concerning for a kidney infection. Urine samples shows that your UTI has resolved, but please complete your antibiotic course. CT shows no infection of the kidneys and no kidney stone. Please schedule an appointment with your urologist as soon as possible to address your interstitial cystitis in the setting of recent UTI.

## 2021-04-03 LAB — URINE CULTURE

## 2021-04-06 ENCOUNTER — Other Ambulatory Visit (HOSPITAL_COMMUNITY): Payer: Self-pay

## 2021-04-21 ENCOUNTER — Other Ambulatory Visit: Payer: Self-pay

## 2021-04-21 ENCOUNTER — Ambulatory Visit (INDEPENDENT_AMBULATORY_CARE_PROVIDER_SITE_OTHER): Payer: Medicare Other

## 2021-04-21 ENCOUNTER — Ambulatory Visit (INDEPENDENT_AMBULATORY_CARE_PROVIDER_SITE_OTHER): Payer: Medicare Other | Admitting: Podiatry

## 2021-04-21 DIAGNOSIS — M7741 Metatarsalgia, right foot: Secondary | ICD-10-CM

## 2021-04-21 DIAGNOSIS — M7742 Metatarsalgia, left foot: Secondary | ICD-10-CM

## 2021-04-21 DIAGNOSIS — S93402S Sprain of unspecified ligament of left ankle, sequela: Secondary | ICD-10-CM | POA: Diagnosis not present

## 2021-04-21 DIAGNOSIS — M79671 Pain in right foot: Secondary | ICD-10-CM

## 2021-04-21 DIAGNOSIS — M79672 Pain in left foot: Secondary | ICD-10-CM

## 2021-04-21 NOTE — Patient Instructions (Signed)

## 2021-04-24 NOTE — Progress Notes (Signed)
Subjective:   Patient ID: Heather Shaw, female   DOB: 80 y.o.   MRN: [id number]   HPI 80 year old female presents the office today for concerns of left ankle swelling and right foot pain.  On the right foot she said that she took a piece of glass out of her foot about 1 month ago but since then she has not had any open sores, skin redness or any drainage.  She has been getting some swelling sensation of the ball of her foot but no other injury.  Regard to the left ankle she gets random swelling to the ankle and intermittent discomfort.  She has a history of ankle sprain years ago and since then she has had some intermittent swelling but seems to be getting worse.  No recent treatment for this.   Review of Systems  All other systems reviewed and are negative.  Past Medical History:  Diagnosis Date   Arthritis    Bladder pain    Bradycardia    DDD (degenerative disc disease), lumbosacral    Grief 05/24/2018   Hyperlipidemia    Hyperplastic colon polyp    Hypertension    IC (interstitial cystitis)    Insomnia 04/06/2010   Qualifier: Diagnosis of  By: Deborra Medina MD, Talia     Internal hemorrhoids    Polyp of rectum    PONV (postoperative nausea and vomiting)    Right wrist tendonitis 08/24/2017   Squamous papilloma    in esophageal polyp   Uterine fibroid    Vertigo    Wears glasses    Wears partial dentures     Past Surgical History:  Procedure Laterality Date   COLONOSCOPY W/ POLYPECTOMY  2009   CYSTO WITH HYDRODISTENSION N/A 05/20/2014   Procedure: CYSTOSCOPY/HYDRODISTENSION, INSTILLATION OF CHLORPACTIN;  Surgeon: Bernestine Amass, MD;  Location: [facility];  Service: Urology;  Laterality: N/A;   CYSTO/  HYDRODISTENTION/  INSTILLATION CLORPACTIC  09-21-2010   DILATION AND CURETTAGE OF UTERUS  1988   KNEE ARTHROSCOPY Right 11-18-2009   TRIGGER FINGER RELEASE Right 08/23/2016   Procedure: RIGHT RING FINGER TRIGGER RELEASE;  Surgeon: Milly Jakob, MD;  Location:  [facility];  Service: Orthopedics;  Laterality: Right;     Current Outpatient Medications:    amLODipine (NORVASC) 10 MG tablet, Take 1 tablet (10 mg total) by mouth daily., Disp: 90 tablet, Rfl: 3   atenolol (TENORMIN) 25 MG tablet, Take 1 tablet (25 mg total) by mouth daily., Disp: 90 tablet, Rfl: 3   benzonatate (TESSALON) 200 MG capsule, Take by mouth., Disp: , Rfl:    cefdinir (OMNICEF) 300 MG capsule, Take 300 mg by mouth 2 (two) times daily., Disp: , Rfl:    diclofenac Sodium (VOLTAREN) 1 % GEL, APPLY 2 G TOPICALLY 4 (FOUR) TIMES DAILY., Disp: 100 g, Rfl: 0   fluocinonide-emollient (LIDEX-E) 0.05 % cream, Apply 1 application topically 2 (two) times daily., Disp: 30 g, Rfl: 0   gabapentin (NEURONTIN) 100 MG capsule, Take 2 capsules (200 mg total) by mouth at bedtime., Disp: 90 capsule, Rfl: 3   hydrochlorothiazide (HYDRODIURIL) 25 MG tablet, Take 1 tablet (25 mg total) by mouth daily., Disp: 90 tablet, Rfl: 1   HYDROcodone-acetaminophen (NORCO) 7.5-325 MG tablet, Take 1 tablet by mouth every 8 (eight) hours., Disp: 90 tablet, Rfl: 0   Meth-Hyo-M Bl-Na Phos-Ph Sal (URO-MP) 118 MG CAPS, Take 1 capsule by mouth 3 (three) times daily as needed., Disp: , Rfl:    phenazopyridine (PYRIDIUM)  200 MG tablet, Take 1 tablet (200 mg total) by mouth 3 (three) times daily as needed for pain., Disp: 6 tablet, Rfl: 0  Allergies  Allergen Reactions   Contrast Media [Iodinated Diagnostic Agents]     ? angioedema   Lisinopril     ? angioedema   Allegra [Fexofenadine] Other (See Comments)    Makes pt nervous   Codeine Nausea And Vomiting   Nsaids     "stomach on fire feeling"    Prednisone     Causes elevation in blood pressure- ? All steroids cause same reaction   Pseudoephedrine Hcl Nausea And Vomiting   Zithromax [Azithromycin] Other (See Comments)    Burns stomach   Sulfa Antibiotics Rash          Objective:  Physical Exam  General: AAO x3, NAD  Dermatological: There  are no open lesions.  In particular on the right foot there is no evidence of any puncture wound there is no erythema or warmth.  Vascular: Dorsalis Pedis artery and Posterior Tibial artery pedal pulses are 2/4 bilateral with immedate capillary fill time.  There is no pain with calf compression, swelling, warmth, erythema.   Neruologic: Grossly intact via light touch bilateral.   Musculoskeletal: Mild diffuse tenderness submetatarsal 1 through 5 on the right foot.  There is slight edema there is no erythema or warmth.  There is no evidence of any puncture wound or foreign body today.  There is no signs of infection.  On the left ankle mild discomfort to the ankle mostly on the lateral aspect.  There is no gross ankle instability present.  Muscular strength 5/5 in all groups tested bilateral.  Gait: Unassisted, Nonantalgic.       Assessment:   Left ankle chronic sprain; metatarsalgia right side     Plan:  -Treatment options discussed including all alternatives, risks, and complications -Etiology of symptoms were discussed -In regards to the right foot discussed using Voltaren gel.  Metatarsal offloading pads were dispensed.  Discussed wearing shoes and good arch support -For the left ankle discussed stretching, rehab exercises.  If needed consider going to physical therapy if symptoms continue MRI.  Trula Slade DPM

## 2021-05-04 ENCOUNTER — Telehealth: Payer: Self-pay | Admitting: Nurse Practitioner

## 2021-05-04 DIAGNOSIS — G8929 Other chronic pain: Secondary | ICD-10-CM

## 2021-05-04 DIAGNOSIS — F1120 Opioid dependence, uncomplicated: Secondary | ICD-10-CM

## 2021-05-04 DIAGNOSIS — M4807 Spinal stenosis, lumbosacral region: Secondary | ICD-10-CM

## 2021-05-04 DIAGNOSIS — M5137 Other intervertebral disc degeneration, lumbosacral region: Secondary | ICD-10-CM

## 2021-05-05 ENCOUNTER — Other Ambulatory Visit (HOSPITAL_COMMUNITY): Payer: Self-pay

## 2021-05-05 NOTE — Telephone Encounter (Signed)
Pt is checking on status of this message. She will be out by tomorrow. Please advise

## 2021-05-06 ENCOUNTER — Other Ambulatory Visit: Payer: Self-pay | Admitting: Nurse Practitioner

## 2021-05-06 ENCOUNTER — Other Ambulatory Visit (HOSPITAL_COMMUNITY): Payer: Self-pay

## 2021-05-06 DIAGNOSIS — F1120 Opioid dependence, uncomplicated: Secondary | ICD-10-CM

## 2021-05-06 DIAGNOSIS — M4807 Spinal stenosis, lumbosacral region: Secondary | ICD-10-CM

## 2021-05-06 DIAGNOSIS — M5137 Other intervertebral disc degeneration, lumbosacral region: Secondary | ICD-10-CM

## 2021-05-06 DIAGNOSIS — G8929 Other chronic pain: Secondary | ICD-10-CM

## 2021-05-07 ENCOUNTER — Other Ambulatory Visit (HOSPITAL_COMMUNITY): Payer: Self-pay

## 2021-05-07 MED ORDER — HYDROCODONE-ACETAMINOPHEN 7.5-325 MG PO TABS
1.0000 | ORAL_TABLET | Freq: Three times a day (TID) | ORAL | 0 refills | Status: DC
  Filled 2021-05-07 (×2): qty 90, 30d supply, fill #0

## 2021-05-07 MED ORDER — HYDROCODONE-ACETAMINOPHEN 7.5-325 MG PO TABS: 1.0000 | ORAL_TABLET | Freq: Three times a day (TID) | ORAL | 0 refills | Status: DC

## 2021-05-07 NOTE — Telephone Encounter (Signed)
Pt called again and needs them sent to [facility].. she does not use the [facility] in [place] any more. I just saw msg about sched a FU.. I will call her back later today and schedule her.

## 2021-05-07 NOTE — Telephone Encounter (Signed)
Chart supports Rx Last filled on 04/06/21

## 2021-05-07 NOTE — Telephone Encounter (Signed)
Contacted pharmacy, spoke with Sadie and canceled Rx.

## 2021-05-07 NOTE — Telephone Encounter (Signed)
Pt called extremely concerned why nobody is responding to her and really needs this refill. States she will be bed ridden if she doesn''t get it.

## 2021-05-21 ENCOUNTER — Other Ambulatory Visit (HOSPITAL_COMMUNITY): Payer: Self-pay

## 2021-05-27 DIAGNOSIS — N3010 Interstitial cystitis (chronic) without hematuria: Secondary | ICD-10-CM | POA: Diagnosis not present

## 2021-05-27 DIAGNOSIS — R300 Dysuria: Secondary | ICD-10-CM | POA: Diagnosis not present

## 2021-05-27 DIAGNOSIS — R102 Pelvic and perineal pain: Secondary | ICD-10-CM | POA: Diagnosis not present

## 2021-06-01 ENCOUNTER — Encounter: Payer: Self-pay | Admitting: Nurse Practitioner

## 2021-06-01 DIAGNOSIS — M4807 Spinal stenosis, lumbosacral region: Secondary | ICD-10-CM

## 2021-06-01 DIAGNOSIS — F1120 Opioid dependence, uncomplicated: Secondary | ICD-10-CM

## 2021-06-01 DIAGNOSIS — G8929 Other chronic pain: Secondary | ICD-10-CM

## 2021-06-01 DIAGNOSIS — M5137 Other intervertebral disc degeneration, lumbosacral region: Secondary | ICD-10-CM

## 2021-06-02 ENCOUNTER — Telehealth: Payer: Self-pay | Admitting: Nurse Practitioner

## 2021-06-03 ENCOUNTER — Other Ambulatory Visit (HOSPITAL_COMMUNITY): Payer: Self-pay

## 2021-06-03 MED ORDER — HYDROCODONE-ACETAMINOPHEN 7.5-325 MG PO TABS
1.0000 | ORAL_TABLET | Freq: Three times a day (TID) | ORAL | 0 refills | Status: DC
  Filled 2021-06-08: qty 90, 30d supply, fill #0

## 2021-06-03 NOTE — Telephone Encounter (Signed)
PMP database reviewed

## 2021-06-08 ENCOUNTER — Other Ambulatory Visit (HOSPITAL_COMMUNITY): Payer: Self-pay

## 2021-06-11 DIAGNOSIS — Z23 Encounter for immunization: Secondary | ICD-10-CM | POA: Diagnosis not present

## 2021-06-12 NOTE — Telephone Encounter (Signed)
Provider discussed with patient via [place]

## 2021-07-05 ENCOUNTER — Encounter: Payer: Self-pay | Admitting: Nurse Practitioner

## 2021-07-06 ENCOUNTER — Other Ambulatory Visit (HOSPITAL_COMMUNITY): Payer: Self-pay

## 2021-07-06 ENCOUNTER — Telehealth: Payer: Self-pay | Admitting: Nurse Practitioner

## 2021-07-06 DIAGNOSIS — G8929 Other chronic pain: Secondary | ICD-10-CM

## 2021-07-06 DIAGNOSIS — F1120 Opioid dependence, uncomplicated: Secondary | ICD-10-CM

## 2021-07-06 DIAGNOSIS — M5137 Other intervertebral disc degeneration, lumbosacral region: Secondary | ICD-10-CM

## 2021-07-06 DIAGNOSIS — M4807 Spinal stenosis, lumbosacral region: Secondary | ICD-10-CM

## 2021-07-06 MED ORDER — HYDROCODONE-ACETAMINOPHEN 7.5-325 MG PO TABS
1.0000 | ORAL_TABLET | Freq: Three times a day (TID) | ORAL | 0 refills | Status: DC
  Filled 2021-07-08: qty 90, 30d supply, fill #0

## 2021-07-06 NOTE — Telephone Encounter (Signed)
What is the name of the medication? Rx #: [id number]  Heather Shaw ([place]) 7.5-325 MG tablet [[id number]]  Have you contacted your pharmacy to request a refill? Pt is requesting a refill  Which pharmacy would you like this sent to?  [facility]  [address], [place] [address]  Phone:  [phone]  Fax:  [phone]  DEA #:  [id number]  Pharmacy Comments: 05-07-21 30ds     Patient notified that their request is being sent to the clinical staff for review and that they should receive a call once it is complete. If they do not receive a call within 72 hours they can check with their pharmacy or our office.

## 2021-07-08 ENCOUNTER — Other Ambulatory Visit (HOSPITAL_COMMUNITY): Payer: Self-pay

## 2021-07-27 DIAGNOSIS — R059 Cough, unspecified: Secondary | ICD-10-CM | POA: Diagnosis not present

## 2021-07-27 DIAGNOSIS — R070 Pain in throat: Secondary | ICD-10-CM | POA: Diagnosis not present

## 2021-07-27 DIAGNOSIS — J019 Acute sinusitis, unspecified: Secondary | ICD-10-CM | POA: Diagnosis not present

## 2021-07-28 ENCOUNTER — Other Ambulatory Visit: Payer: Self-pay

## 2021-07-28 ENCOUNTER — Ambulatory Visit (INDEPENDENT_AMBULATORY_CARE_PROVIDER_SITE_OTHER): Payer: Medicare Other | Admitting: Nurse Practitioner

## 2021-07-28 ENCOUNTER — Other Ambulatory Visit (HOSPITAL_COMMUNITY): Payer: Self-pay

## 2021-07-28 ENCOUNTER — Encounter: Payer: Self-pay | Admitting: Nurse Practitioner

## 2021-07-28 VITALS — BP 140/82 | HR 78 | Temp 96.9°F | Ht 65.0 in | Wt 206.6 lb

## 2021-07-28 DIAGNOSIS — E782 Mixed hyperlipidemia: Secondary | ICD-10-CM

## 2021-07-28 DIAGNOSIS — I700 Atherosclerosis of aorta: Secondary | ICD-10-CM | POA: Diagnosis not present

## 2021-07-28 DIAGNOSIS — I10 Essential (primary) hypertension: Secondary | ICD-10-CM | POA: Diagnosis not present

## 2021-07-28 DIAGNOSIS — F1120 Opioid dependence, uncomplicated: Secondary | ICD-10-CM | POA: Diagnosis not present

## 2021-07-28 DIAGNOSIS — R300 Dysuria: Secondary | ICD-10-CM | POA: Diagnosis not present

## 2021-07-28 DIAGNOSIS — E559 Vitamin D deficiency, unspecified: Secondary | ICD-10-CM

## 2021-07-28 DIAGNOSIS — N3010 Interstitial cystitis (chronic) without hematuria: Secondary | ICD-10-CM | POA: Diagnosis not present

## 2021-07-28 DIAGNOSIS — I471 Supraventricular tachycardia: Secondary | ICD-10-CM

## 2021-07-28 DIAGNOSIS — M4807 Spinal stenosis, lumbosacral region: Secondary | ICD-10-CM

## 2021-07-28 DIAGNOSIS — M5137 Other intervertebral disc degeneration, lumbosacral region: Secondary | ICD-10-CM

## 2021-07-28 DIAGNOSIS — G8929 Other chronic pain: Secondary | ICD-10-CM

## 2021-07-28 MED ORDER — ROSUVASTATIN CALCIUM 10 MG PO TABS
ORAL_TABLET | ORAL | 3 refills | Status: DC
  Filled 2021-07-28: qty 32, 84d supply, fill #0

## 2021-07-28 MED ORDER — HYDROCODONE-ACETAMINOPHEN 7.5-325 MG PO TABS
1.0000 | ORAL_TABLET | Freq: Three times a day (TID) | ORAL | 0 refills | Status: DC
  Filled 2021-08-07: qty 90, 30d supply, fill #0

## 2021-07-28 NOTE — Assessment & Plan Note (Signed)
Last urology appt 54months ago. Reports increased Dysuria today. No constipation No diarrhea No fever No CVA tenderness  no hematuria  Check urinalysis today

## 2021-07-28 NOTE — Progress Notes (Signed)
Subjective:  Patient ID: Heather Shaw, female    DOB: 06-06-1941  Age: 81 y.o. MRN: [id number]  CC: Follow-up (Patient would like to follow up on medications and have blood work done. /Pt states she also has burning when urinating and would like to be checked for UTI)  HPI  Atherosclerosis of aorta ([place]) We discussed the pros and cons of statin use.advised about the importance of DASH diet She is unable to participate in any exercise regimend dure to chronic back pain secondary to spinal stenosis. Also informed her about her risk for cardiovascular disease over the next 58yrs: 31% She agreed to start crestor 10mg  2x/week, then titrate to 3x/week if no adverse side effects. F/up in 58months for repeat lipid panel (fasting)  Opioid type dependence, continuous (HCC) Denies any adverse effects with current use of hydrocodone. Repeat UDS today  Hypertensive disorder Maintain amlodipine, atenolol and hCTZ dose Repeat BMP.  Chronic interstitial cystitis Last urology appt 25months ago. Reports increased Dysuria today. No constipation No diarrhea No fever No CVA tenderness  no hematuria  Check urinalysis today  SVT (supraventricular tachycardia) (HCC) Rate controlled with atenolol. Continue current med Cbc completed 03/2021: normal Repeat BMP and TSH today  Wt Readings from Last 3 Encounters:  07/28/21 206 lb 9.6 oz (93.7 kg)  04/01/21 208 lb 1.8 oz (94.4 kg)  03/17/21 208 lb 3.2 oz (94.4 kg)    Reviewed past Medical, Social and Family history today.  Outpatient Medications Prior to Visit  Medication Sig Dispense Refill   amLODipine (NORVASC) 10 MG tablet Take 1 tablet (10 mg total) by mouth daily. 90 tablet 3   atenolol (TENORMIN) 25 MG tablet Take 1 tablet (25 mg total) by mouth daily. 90 tablet 3   benzonatate (TESSALON) 200 MG capsule Take by mouth.     cefdinir (OMNICEF) 300 MG capsule Take 300 mg by mouth 2 (two) times daily.     diclofenac Sodium (VOLTAREN) 1 %  GEL APPLY 2 G TOPICALLY 4 (FOUR) TIMES DAILY. 100 g 0   fluocinonide-emollient (LIDEX-E) 0.05 % cream Apply 1 application topically 2 (two) times daily. 30 g 0   gabapentin (NEURONTIN) 100 MG capsule Take 2 capsules (200 mg total) by mouth at bedtime. 90 capsule 3   hydrochlorothiazide (HYDRODIURIL) 25 MG tablet Take 1 tablet (25 mg total) by mouth daily. 90 tablet 1   Meth-Hyo-M Bl-Na Phos-Ph Sal (URO-MP) 118 MG CAPS Take 1 capsule by mouth 3 (three) times daily as needed.     phenazopyridine (PYRIDIUM) 200 MG tablet Take 1 tablet (200 mg total) by mouth 3 (three) times daily as needed for pain. 6 tablet 0   HYDROcodone-acetaminophen (NORCO) 7.5-325 MG tablet Take 1 tablet by mouth every 8 (eight) hours. No additional refill without office visit 90 tablet 0   No facility-administered medications prior to visit.    ROS See HPI  Objective:  BP 140/82 (BP Location: Left Arm, Patient Position: Sitting, Cuff Size: Large)    Pulse 78    Temp (!) 96.9 F (36.1 C) (Temporal)    Ht 5\' 5"  (1.651 m)    Wt 206 lb 9.6 oz (93.7 kg)    SpO2 98%    BMI 34.38 kg/m   Physical Exam Constitutional:      Appearance: She is obese.  Cardiovascular:     Rate and Rhythm: Normal rate and regular rhythm.     Pulses: Normal pulses.     Heart sounds: Normal heart sounds.  Pulmonary:  Effort: Pulmonary effort is normal.     Breath sounds: Normal breath sounds.  Musculoskeletal:     Cervical back: Normal range of motion and neck supple.     Right lower leg: No edema.     Left lower leg: No edema.  Neurological:     Mental Status: She is alert and oriented to person, place, and time.  Psychiatric:        Mood and Affect: Mood normal.        Behavior: Behavior normal.        Thought Content: Thought content normal.   Assessment & Plan:  This visit occurred during the SARS-CoV-2 public health emergency.  Safety protocols were in place, including screening questions prior to the visit, additional usage of  staff PPE, and extensive cleaning of exam room while observing appropriate contact time as indicated for disinfecting solutions.   Sibyl was seen today for follow-up.  Diagnoses and all orders for this visit:  Primary hypertension -     Basic metabolic panel -     TSH  Dysuria  Atherosclerosis of aorta (HCC) -     rosuvastatin (CRESTOR) 10 MG tablet; Take 1 tablet by mouth 2 times a week for 1 month, then 1 tablet 3 times a week continuously  Opioid type dependence, continuous (HCC) -     Cancel: DRUG MONITORING, PANEL 8 WITH CONFIRMATION, URINE -     HYDROcodone-acetaminophen (NORCO) 7.5-325 MG tablet; Take 1 tablet by mouth every 8 (eight) hours. Fill on or after 08/07/2021 -     DRUG MONITORING, PANEL 8 WITH CONFIRMATION, URINE; Future  SVT (supraventricular tachycardia) (HCC)  Mixed hyperlipidemia -     rosuvastatin (CRESTOR) 10 MG tablet; Take 1 tablet by mouth 2 times a week for 1 month, then 1 tablet 3 times a week continuously  Vitamin D deficiency -     Vitamin D (25 hydroxy)  Chronic interstitial cystitis -     Urinalysis w microscopic + reflex cultur  Other chronic pain -     Cancel: DRUG MONITORING, PANEL 8 WITH CONFIRMATION, URINE -     HYDROcodone-acetaminophen (NORCO) 7.5-325 MG tablet; Take 1 tablet by mouth every 8 (eight) hours. Fill on or after 08/07/2021 -     DRUG MONITORING, PANEL 8 WITH CONFIRMATION, URINE; Future  Opioid type dependence, continuous (HCC) -     Cancel: DRUG MONITORING, PANEL 8 WITH CONFIRMATION, URINE -     HYDROcodone-acetaminophen (NORCO) 7.5-325 MG tablet; Take 1 tablet by mouth every 8 (eight) hours. Fill on or after 08/07/2021 -     DRUG MONITORING, PANEL 8 WITH CONFIRMATION, URINE; Future  DDD (degenerative disc disease), lumbosacral -     Cancel: DRUG MONITORING, PANEL 8 WITH CONFIRMATION, URINE -     HYDROcodone-acetaminophen (NORCO) 7.5-325 MG tablet; Take 1 tablet by mouth every 8 (eight) hours. Fill on or after 08/07/2021 -      DRUG MONITORING, PANEL 8 WITH CONFIRMATION, URINE; Future  Spinal stenosis of lumbosacral region -     Cancel: DRUG MONITORING, PANEL 8 WITH CONFIRMATION, URINE -     HYDROcodone-acetaminophen (NORCO) 7.5-325 MG tablet; Take 1 tablet by mouth every 8 (eight) hours. Fill on or after 08/07/2021 -     DRUG MONITORING, PANEL 8 WITH CONFIRMATION, URINE; Future    Problem List Items Addressed This Visit       Cardiovascular and Mediastinum   Atherosclerosis of aorta [facility])    We discussed the pros and cons of  statin use.advised about the importance of DASH diet She is unable to participate in any exercise regimend dure to chronic back pain secondary to spinal stenosis. Also informed her about her risk for cardiovascular disease over the next 77yrs: 31% She agreed to start crestor 10mg  2x/week, then titrate to 3x/week if no adverse side effects. F/up in 84months for repeat lipid panel (fasting)      Relevant Medications   rosuvastatin (CRESTOR) 10 MG tablet   Hypertensive disorder - Primary    Maintain amlodipine, atenolol and hCTZ dose Repeat BMP.      Relevant Medications   rosuvastatin (CRESTOR) 10 MG tablet   Other Relevant Orders   Basic metabolic panel   TSH   SVT (supraventricular tachycardia) (HCC)    Rate controlled with atenolol. Continue current med Cbc completed 03/2021: normal Repeat BMP and TSH today      Relevant Medications   rosuvastatin (CRESTOR) 10 MG tablet     Musculoskeletal and Integument   DDD (degenerative disc disease), lumbosacral   Relevant Medications   HYDROcodone-acetaminophen (NORCO) 7.5-325 MG tablet (Start on 08/07/2021)   Other Relevant Orders   DRUG MONITORING, PANEL 8 WITH CONFIRMATION, URINE     Genitourinary   Chronic interstitial cystitis    Last urology appt 31months ago. Reports increased Dysuria today. No constipation No diarrhea No fever No CVA tenderness  no hematuria  Check urinalysis today      Relevant Orders    Urinalysis w microscopic + reflex cultur     Other   Chronic pain   Relevant Medications   HYDROcodone-acetaminophen (NORCO) 7.5-325 MG tablet (Start on 08/07/2021)   Other Relevant Orders   DRUG MONITORING, PANEL 8 WITH CONFIRMATION, URINE   HLD (hyperlipidemia)   Relevant Medications   rosuvastatin (CRESTOR) 10 MG tablet   Opioid type dependence, continuous ([place])    Denies any adverse effects with current use of hydrocodone. Repeat UDS today      Relevant Medications   HYDROcodone-acetaminophen (NORCO) 7.5-325 MG tablet (Start on 08/07/2021)   Other Relevant Orders   DRUG MONITORING, PANEL 8 WITH CONFIRMATION, URINE   Vitamin D deficiency   Relevant Orders   Vitamin D (25 hydroxy)   Other Visit Diagnoses     Dysuria       Spinal stenosis of lumbosacral region       Relevant Medications   HYDROcodone-acetaminophen (NORCO) 7.5-325 MG tablet (Start on 08/07/2021)   Other Relevant Orders   DRUG MONITORING, PANEL 8 WITH CONFIRMATION, URINE       Follow-up: Return in about 3 months (around 10/26/2021) for chronic pain, HTN and , hyperlipidemia (fasting).  Wilfred Lacy, NP

## 2021-07-28 NOTE — Assessment & Plan Note (Signed)
We discussed the pros and cons of statin use.advised about the importance of DASH diet She is unable to participate in any exercise regimend dure to chronic back pain secondary to spinal stenosis. Also informed her about her risk for cardiovascular disease over the next 38yrs: 31% She agreed to start crestor 10mg  2x/week, then titrate to 3x/week if no adverse side effects. F/up in 62months for repeat lipid panel (fasting)

## 2021-07-28 NOTE — Assessment & Plan Note (Signed)
Rate controlled with atenolol. Continue current med Cbc completed 03/2021: normal Repeat BMP and TSH today

## 2021-07-28 NOTE — Assessment & Plan Note (Signed)
Maintain amlodipine, atenolol and hCTZ dose Repeat BMP.

## 2021-07-28 NOTE — Assessment & Plan Note (Addendum)
Denies any adverse effects with current use of hydrocodone. Repeat UDS today

## 2021-07-28 NOTE — Patient Instructions (Signed)
Start crestor as discussed Continue other medications as prescribed Go to lab for blood draw and urine collection.

## 2021-07-29 ENCOUNTER — Other Ambulatory Visit (HOSPITAL_COMMUNITY): Payer: Self-pay

## 2021-07-29 LAB — BASIC METABOLIC PANEL
BUN: 16 mg/dL (ref 6–23)
CO2: 24 mEq/L (ref 19–32)
Calcium: 9.4 mg/dL (ref 8.4–10.5)
Chloride: 104 mEq/L (ref 96–112)
Creatinine, Ser: 0.7 mg/dL (ref 0.40–1.20)
GFR: 82.79 mL/min (ref >60.00)
Glucose, Bld: 105 mg/dL — ABNORMAL HIGH (ref 70–99)
Potassium: 4.7 mEq/L (ref 3.5–5.1)
Sodium: 139 mEq/L (ref 135–145)

## 2021-07-29 LAB — TSH: TSH: 2.17 u[IU]/mL (ref 0.35–5.50)

## 2021-07-29 LAB — VITAMIN D 25 HYDROXY (VIT D DEFICIENCY, FRACTURES): VITD: 7.22 ng/mL — ABNORMAL LOW (ref 30.00–100.00)

## 2021-07-29 MED ORDER — VITAMIN D (ERGOCALCIFEROL) 1.25 MG (50000 UNIT) PO CAPS
50000.0000 [IU] | ORAL_CAPSULE | ORAL | 0 refills | Status: DC
  Filled 2021-07-29 – 2021-08-06 (×2): qty 12, 84d supply, fill #0

## 2021-07-29 NOTE — Addendum Note (Signed)
Addended by: Wilfred Lacy L on: 07/29/2021 12:01 PM   Modules accepted: Orders

## 2021-07-30 LAB — URINALYSIS W MICROSCOPIC + REFLEX CULTURE
Bacteria, UA: NONE SEEN /HPF
Bilirubin Urine: NEGATIVE
Glucose, UA: NEGATIVE
Hgb urine dipstick: NEGATIVE
Hyaline Cast: NONE SEEN /LPF
Ketones, ur: NEGATIVE
Nitrites, Initial: NEGATIVE
Protein, ur: NEGATIVE
Specific Gravity, Urine: 1.018 (ref 1.001–1.035)
pH: 5.5 (ref 5.0–8.0)

## 2021-07-30 LAB — URINE CULTURE
MICRO NUMBER:: 12822075
Result:: NO GROWTH
SPECIMEN QUALITY:: ADEQUATE

## 2021-07-30 LAB — CULTURE INDICATED

## 2021-08-05 ENCOUNTER — Other Ambulatory Visit (HOSPITAL_COMMUNITY): Payer: Self-pay

## 2021-08-06 ENCOUNTER — Other Ambulatory Visit (HOSPITAL_COMMUNITY): Payer: Self-pay

## 2021-08-07 ENCOUNTER — Other Ambulatory Visit (HOSPITAL_COMMUNITY): Payer: Self-pay

## 2021-08-11 ENCOUNTER — Telehealth: Payer: Self-pay | Admitting: Nurse Practitioner

## 2021-08-11 NOTE — Chronic Care Management (AMB) (Signed)
°  Chronic Care Management   Note  08/11/2021 Name: Donika Butner MRN: [id number] DOB: [birth year]  Margretta Ditty Toelle is a 81 y.o. year old female who is a primary care patient of [name], Charlene Brooke, NP. I reached out to [facility] by phone today in response to a referral sent by Ms. Haelyn Hellon Loken's PCP, [name], Charlene Brooke, NP.   Ms. Pha was given information about Chronic Care Management services today including:  CCM service includes personalized support from designated clinical staff supervised by her physician, including individualized plan of care and coordination with other care providers 24/7 contact phone numbers for assistance for urgent and routine care needs. Service will only be billed when office clinical staff spend 20 minutes or more in a month to coordinate care. Only one practitioner may furnish and bill the service in a calendar month. The patient may stop CCM services at any time (effective at the end of the month) by phone call to the office staff.   Patient agreed to services and verbal consent obtained.   Follow up plan: PT [facility] [place]

## 2021-08-31 ENCOUNTER — Telehealth: Payer: Self-pay

## 2021-08-31 NOTE — Progress Notes (Signed)
Chronic Care Management Pharmacy Assistant   Name: Heather Shaw  MRN: [id number] DOB: February 08, 1941  Chart Review for Clinical pharmacist on 09/17/2021 at 9:30 am.   Conditions to be addressed/monitored: HTN, HLD, and SVT, Atherosclerosis of aorta, Lumbar radiculopathy, Carpal tunnel syndrome. DDD, Vitamin D deficiency, Chronic Pain, Opioid type dependence, Hyperglycemia  Primary concerns for visit include: Patient states she is unsure if she is having a side effect from Rosuvastatin.Patient reports she is not taking Rosuvastatin anymore because when she was taking it she was having a symptoms of nausea and muscle aches.   Recent office visits:  07/28/2021 Charlott [name] NP (PCP) Start Rosuvastatin 10 mg 2 times weekly for 1 month, then titrate to 3 times weekly,Start Vitamin D to 50,000 every 7 days, Return in about 3 months  03/17/2021 Charlott [name] NP (PCP) No medication changes noted, Follow up 3 months  Recent consult visits:  04/21/2021 Celesta Gentile DPM (Podiatry) No Medication Changes noted 03/31/2021 Colin Benton DO (Family Medicine) Start Molnupiravir 800 mg 2 times daily 03/27/2021 Dr. Duanne Moron MD (Internal Medicine) Start Mucinex twice daily for 7-10 days,   Hospital visits:  Medication Reconciliation was completed by comparing discharge summary, patients EMR and Pharmacy list, and upon discussion with patient.  Admitted to the hospital on 04/01/2021 due to Interstitial Cystitis without hematuria. Discharge date was 04/02/2021. Discharged from [facility]?Medications Started at [facility] Discharge:?? -started None  Medication Changes at Hospital Discharge: -Changed None  Medications Discontinued at Hospital Discharge: -Stopped None  Medications that remain the same after Hospital Discharge:??  -All other medications will remain the same.    Have you seen any other providers since your last visit?   Patient denies any seeing other  providers.  Any changes in your medications or health?   Patient denies any changes in her health.  Any side effects from any medications?   Patient states she is unsure if she is having a side effect from Rosuvastatin.Patient reports she is not taking Rosuvastatin anymore because when she was taking it she was having a symptoms of nausea and muscle aches.  Do you have an symptoms or problems not managed by your medications?   Patient denies any problems at this time.  Any concerns about your health right now?   Patient denies any concerns at this time.  Has your provider asked that you check blood pressure, blood sugar, or follow special diet at home?   Patient states she checks her blood pressure at home.  Do you get any type of exercise on a regular basis?   Patient states she walks as much as she can depending on her leg and back pain.  Can you think of a goal you would like to reach for your health?   Patient will inform clinical pharmacist.  Do you have any problems getting your medications?   Patinet denies any problems or issue at this time.  Is there anything that you would like to discuss during the appointment?    Patient states she is unsure if she is having a side effect from Rosuvastatin.Patient reports she is not taking Rosuvastatin anymore because when she was taking it she was having a symptoms of nausea and muscle aches.  Please bring medications and supplements to appointment  Patient is aware to have all medications and supplements at her appointment.  Medications: Outpatient Encounter Medications as of 08/31/2021  Medication Sig   amLODipine (NORVASC) 10 MG tablet  Take 1 tablet (10 mg total) by mouth daily.   atenolol (TENORMIN) 25 MG tablet Take 1 tablet (25 mg total) by mouth daily.   benzonatate (TESSALON) 200 MG capsule Take by mouth.   cefdinir (OMNICEF) 300 MG capsule Take 300 mg by mouth 2 (two) times daily.   fluocinonide-emollient (LIDEX-E) 0.05 %  cream Apply 1 application topically 2 (two) times daily.   gabapentin (NEURONTIN) 100 MG capsule Take 2 capsules (200 mg total) by mouth at bedtime.   hydrochlorothiazide (HYDRODIURIL) 25 MG tablet Take 1 tablet (25 mg total) by mouth daily.   HYDROcodone-acetaminophen (NORCO) 7.5-325 MG tablet Take 1 tablet by mouth every 8 (eight) hours.   Meth-Hyo-M Bl-Na Phos-Ph Sal (URO-MP) 118 MG CAPS Take 1 capsule by mouth 3 (three) times daily as needed.   phenazopyridine (PYRIDIUM) 200 MG tablet Take 1 tablet (200 mg total) by mouth 3 (three) times daily as needed for pain.   rosuvastatin (CRESTOR) 10 MG tablet Take 1 tablet by mouth 2 times a week for 1 month, then 1 tablet 3 times a week continuously   Vitamin D, Ergocalciferol, (DRISDOL) 1.25 MG (50000 UNIT) CAPS capsule Take 1 capsule (50,000 Units total) by mouth every 7 (seven) days.   [DISCONTINUED] atorvastatin (LIPITOR) 20 MG tablet Take 1 tablet (20 mg total) by mouth daily.   No facility-administered encounter medications on file as of 08/31/2021.    Care Gaps: Shingrix Vaccine COVID-19 Vaccine HTN: 140/82 on 07/28/2021  Star Rating Drugs: Rosuvastatin 10 mg last filled 08/07/2021 84 day supply at at [facility].  Medication Fill gaps: Amlodipine 10 mg last filled 05/21/2021 90 day supply Hydrochlorothiazide 25 mg last filled 04/01/2021 for 90 day supply  [place] Pharmacist Assistant [phone]

## 2021-09-03 ENCOUNTER — Encounter: Payer: Self-pay | Admitting: Nurse Practitioner

## 2021-09-03 ENCOUNTER — Telehealth: Payer: Self-pay | Admitting: Nurse Practitioner

## 2021-09-03 DIAGNOSIS — F1120 Opioid dependence, uncomplicated: Secondary | ICD-10-CM

## 2021-09-03 DIAGNOSIS — M4807 Spinal stenosis, lumbosacral region: Secondary | ICD-10-CM

## 2021-09-03 DIAGNOSIS — M5137 Other intervertebral disc degeneration, lumbosacral region: Secondary | ICD-10-CM

## 2021-09-03 DIAGNOSIS — G8929 Other chronic pain: Secondary | ICD-10-CM

## 2021-09-03 NOTE — Telephone Encounter (Signed)
Pt called and is wanting to get refill on HYDROcodone-acetaminophen (NORCO) 7.5-325 MG tablet, she wants sent to [facility] outpatient pharmacy. Call back [phone] if needed

## 2021-09-04 ENCOUNTER — Other Ambulatory Visit (HOSPITAL_COMMUNITY): Payer: Self-pay

## 2021-09-04 ENCOUNTER — Other Ambulatory Visit: Payer: Self-pay | Admitting: Nurse Practitioner

## 2021-09-04 DIAGNOSIS — F1120 Opioid dependence, uncomplicated: Secondary | ICD-10-CM

## 2021-09-04 DIAGNOSIS — M4807 Spinal stenosis, lumbosacral region: Secondary | ICD-10-CM

## 2021-09-04 DIAGNOSIS — G8929 Other chronic pain: Secondary | ICD-10-CM

## 2021-09-04 DIAGNOSIS — M5137 Other intervertebral disc degeneration, lumbosacral region: Secondary | ICD-10-CM

## 2021-09-04 MED ORDER — HYDROCODONE-ACETAMINOPHEN 7.5-325 MG PO TABS
1.0000 | ORAL_TABLET | Freq: Three times a day (TID) | ORAL | 0 refills | Status: DC
  Filled 2021-09-07: qty 21, 7d supply, fill #0

## 2021-09-04 NOTE — Telephone Encounter (Signed)
Refill request for:  Hydrocodone/Acet 75/325 mg  LR 08/07/21, #90, 0 rf LOV 08/07/21 FOV  none scheduled.    Please review and advise  Thanks. Dm/cma

## 2021-09-07 ENCOUNTER — Other Ambulatory Visit (HOSPITAL_COMMUNITY): Payer: Self-pay

## 2021-09-07 ENCOUNTER — Telehealth: Payer: Self-pay | Admitting: Nurse Practitioner

## 2021-09-07 NOTE — Telephone Encounter (Signed)
Pt would like to speak with a nurse regarding her Hydrocodone rx.

## 2021-09-09 NOTE — Telephone Encounter (Signed)
Rx sent on 09/07/21

## 2021-09-10 NOTE — Telephone Encounter (Signed)
Rx sent on 09/07/21

## 2021-09-14 ENCOUNTER — Other Ambulatory Visit: Payer: Self-pay

## 2021-09-14 ENCOUNTER — Other Ambulatory Visit (INDEPENDENT_AMBULATORY_CARE_PROVIDER_SITE_OTHER): Payer: Medicare Other

## 2021-09-14 DIAGNOSIS — M4807 Spinal stenosis, lumbosacral region: Secondary | ICD-10-CM

## 2021-09-14 DIAGNOSIS — G8929 Other chronic pain: Secondary | ICD-10-CM

## 2021-09-14 DIAGNOSIS — M5137 Other intervertebral disc degeneration, lumbosacral region: Secondary | ICD-10-CM

## 2021-09-14 DIAGNOSIS — F1120 Opioid dependence, uncomplicated: Secondary | ICD-10-CM

## 2021-09-15 ENCOUNTER — Encounter: Payer: Self-pay | Admitting: Nurse Practitioner

## 2021-09-15 DIAGNOSIS — M5137 Other intervertebral disc degeneration, lumbosacral region: Secondary | ICD-10-CM

## 2021-09-15 DIAGNOSIS — M4807 Spinal stenosis, lumbosacral region: Secondary | ICD-10-CM

## 2021-09-15 DIAGNOSIS — G8929 Other chronic pain: Secondary | ICD-10-CM

## 2021-09-15 DIAGNOSIS — F1120 Opioid dependence, uncomplicated: Secondary | ICD-10-CM

## 2021-09-15 DIAGNOSIS — M51379 Other intervertebral disc degeneration, lumbosacral region without mention of lumbar back pain or lower extremity pain: Secondary | ICD-10-CM

## 2021-09-16 ENCOUNTER — Other Ambulatory Visit (HOSPITAL_COMMUNITY): Payer: Self-pay

## 2021-09-16 MED ORDER — HYDROCODONE-ACETAMINOPHEN 7.5-325 MG PO TABS
1.0000 | ORAL_TABLET | Freq: Three times a day (TID) | ORAL | 0 refills | Status: DC
  Filled 2021-09-16: qty 90, 30d supply, fill #0

## 2021-09-17 ENCOUNTER — Other Ambulatory Visit (HOSPITAL_COMMUNITY): Payer: Self-pay

## 2021-09-17 ENCOUNTER — Ambulatory Visit (INDEPENDENT_AMBULATORY_CARE_PROVIDER_SITE_OTHER): Payer: Medicare Other

## 2021-09-17 DIAGNOSIS — E782 Mixed hyperlipidemia: Secondary | ICD-10-CM

## 2021-09-17 DIAGNOSIS — I10 Essential (primary) hypertension: Secondary | ICD-10-CM

## 2021-09-17 DIAGNOSIS — M5416 Radiculopathy, lumbar region: Secondary | ICD-10-CM

## 2021-09-17 DIAGNOSIS — I700 Atherosclerosis of aorta: Secondary | ICD-10-CM

## 2021-09-17 LAB — DRUG MONITORING, PANEL 8 WITH CONFIRMATION, URINE
6 Acetylmorphine: NEGATIVE ng/mL (ref <10)
Alcohol Metabolites: NEGATIVE ng/mL (ref <500)
Amphetamines: NEGATIVE ng/mL (ref <500)
Benzodiazepines: NEGATIVE ng/mL (ref <100)
Buprenorphine, Urine: NEGATIVE ng/mL (ref <5)
Cocaine Metabolite: NEGATIVE ng/mL (ref <150)
Codeine: NEGATIVE ng/mL (ref <50)
Creatinine: 63.9 mg/dL (ref >=20.0)
Hydrocodone: 421 ng/mL — ABNORMAL HIGH (ref <50)
Hydromorphone: 190 ng/mL — ABNORMAL HIGH (ref <50)
MDMA: NEGATIVE ng/mL (ref <500)
Marijuana Metabolite: NEGATIVE ng/mL (ref <20)
Morphine: NEGATIVE ng/mL (ref <50)
Norhydrocodone: 626 ng/mL — ABNORMAL HIGH (ref <50)
Opiates: POSITIVE ng/mL — AB (ref <100)
Oxidant: NEGATIVE ug/mL (ref <200)
Oxycodone: NEGATIVE ng/mL (ref <100)
pH: 5.6 (ref 4.5–9.0)

## 2021-09-17 LAB — DM TEMPLATE

## 2021-09-17 MED ORDER — ATORVASTATIN CALCIUM 20 MG PO TABS
ORAL_TABLET | ORAL | 3 refills | Status: DC
  Filled 2021-09-17 – 2021-10-06 (×2): qty 36, 84d supply, fill #0

## 2021-09-17 NOTE — Progress Notes (Signed)
Chronic Care Management Pharmacy Note  09/25/2021 Name:  Heather Shaw MRN:  [id number] DOB:  [birth year]  Summary: Patient presents for initial CCM consult.  -Stopped rosuvastatin, headaches and dizziness which has improved since stopping.   Recommendations/Changes made from today's visit: -Patient amenable to retry atorvastatin 20 mg twice weekly  Plan: CPP follow-up 2 months   Subjective: Heather Shaw is an 81 y.o. year old female who is a primary patient of [name], Charlene Brooke, NP.  The CCM team was consulted for assistance with disease management and care coordination needs.    Engaged with patient by telephone for initial visit in response to provider referral for pharmacy case management and/or care coordination services.   Consent to Services:  The patient was given the following information about Chronic Care Management services today, agreed to services, and gave verbal consent: 1. CCM service includes personalized support from designated clinical staff supervised by the primary care provider, including individualized plan of care and coordination with other care providers 2. 24/7 contact phone numbers for assistance for urgent and routine care needs. 3. Service will only be billed when office clinical staff spend 20 minutes or more in a month to coordinate care. 4. Only one practitioner may furnish and bill the service in a calendar month. 5.The patient may stop CCM services at any time (effective at the end of the month) by phone call to the office staff. 6. The patient will be responsible for cost sharing (co-pay) of up to 20% of the service fee (after annual deductible is met). Patient agreed to services and consent obtained.  Patient Care Team: [name], Charlene Brooke, NP as PCP - General (Internal Medicine) Rana Snare, MD (Inactive) as Consulting Physician (Urology) Maisie Fus, MD as Consulting Physician (Obstetrics and Gynecology) Juluis Rainier as Consulting  Physician (Optometry) Lavonna Monarch, MD as Consulting Physician (Dermatology) Germaine Pomfret, [facility] as Pharmacist (Pharmacist)  Recent office visits: 07/28/2021 Charlott [name] NP (PCP) Start Rosuvastatin 10 mg 2 times weekly for 1 month, then titrate to 3 times weekly,Start Vitamin D to 50,000 every 7 days, Return in about 3 months  03/17/2021 Charlott [name] NP (PCP) No medication changes noted, Follow up 3 months  Recent consult visits: 04/21/2021 Celesta Gentile DPM (Podiatry) No Medication Changes noted 03/31/2021 Colin Benton DO (Family Medicine) Start Molnupiravir 800 mg 2 times daily 03/27/2021 Dr. Duanne Moron MD (Internal Medicine) Start Mucinex twice daily for 7-10 days,   Hospital visits: Medication Reconciliation was completed by comparing discharge summary, patients EMR and Pharmacy list, and upon discussion with patient.   Admitted to the hospital on 04/01/2021 due to Interstitial Cystitis without hematuria. Discharge date was 04/02/2021. Discharged from [place]?Medications Started at [facility] Discharge:?? -started None   Medication Changes at Hospital Discharge: -Changed None   Medications Discontinued at Hospital Discharge: -Stopped None   Medications that remain the same after Hospital Discharge:??  -All other medications will remain the same.     Objective:  Lab Results  Component Value Date   CREATININE 0.70 07/28/2021   BUN 16 07/28/2021   GFR 82.79 07/28/2021   GFRNONAA >60 04/01/2021   GFRAA >60 01/25/2020   NA 139 07/28/2021   K 4.7 07/28/2021   CALCIUM 9.4 07/28/2021   CO2 24 07/28/2021   GLUCOSE 105 (H) 07/28/2021    Lab Results  Component Value Date/Time   HGBA1C 6.0 03/17/2021 11:49 AM   HGBA1C 5.8 09/22/2020 02:57 PM  GFR 82.79 07/28/2021 02:15 PM   GFR 83.58 03/17/2021 11:49 AM    Last diabetic Eye exam: No results found for: HMDIABEYEEXA  Last diabetic Foot exam: No results found for: HMDIABFOOTEX   Lab  Results  Component Value Date   CHOL 212 (H) 03/17/2021   HDL 40.60 03/17/2021   LDLCALC 138 (H) 03/17/2021   LDLDIRECT 151.0 09/22/2020   TRIG 166.0 (H) 03/17/2021   CHOLHDL 5 03/17/2021    Hepatic Function Latest Ref Rng & Units 04/01/2021 07/13/2019 05/24/2018  Total Protein 6.5 - 8.1 g/dL 7.3 6.9 6.9  Albumin 3.5 - 5.0 g/dL 4.0 4.2 4.2  AST 15 - 41 U/L '28 15 14  ' ALT 0 - 44 U/L '25 10 11  ' Alk Phosphatase 38 - 126 U/L 42 57 48  Total Bilirubin 0.3 - 1.2 mg/dL 0.7 0.4 0.4  Bilirubin, Direct 0.0 - 0.3 mg/dL - - -    Lab Results  Component Value Date/Time   TSH 2.17 07/28/2021 02:15 PM   TSH 1.41 05/24/2018 10:12 AM    CBC Latest Ref Rng & Units 04/01/2021 01/25/2020 05/24/2018  WBC 4.0 - 10.5 K/uL 4.6 8.0 9.4  Hemoglobin 12.0 - 15.0 g/dL 15.4(H) 14.4 14.6  Hematocrit 36.0 - 46.0 % 44.3 43.7 42.1  Platelets 150 - 400 K/uL 174 230 244.0    Lab Results  Component Value Date/Time   VD25OH 7.22 (L) 07/28/2021 02:15 PM   VD25OH 41 10/03/2009 08:21 PM    Clinical ASCVD: No  The 10-year ASCVD risk score (Arnett DK, et al., 2019) is: 52.2%   Values used to calculate the score:     Age: 89 years     Sex: Female     Is Non-Hispanic African American: No     Diabetic: Yes     Tobacco smoker: No     Systolic Blood Pressure: 169 mmHg     Is BP treated: Yes     HDL Cholesterol: 40.6 mg/dL     Total Cholesterol: 212 mg/dL    Depression screen [facility] 2/9 09/30/2020 09/26/2019 07/09/2019  Decreased Interest 0 0 0  Down, Depressed, Hopeless 0 0 -  PHQ - 2 Score 0 0 0  Some recent data might be hidden    Social History   Tobacco Use  Smoking Status Former   Types: Cigarettes   Quit date: 05/17/1971   Years since quitting: 50.3  Smokeless Tobacco Never   BP Readings from Last 3 Encounters:  07/28/21 140/82  04/02/21 139/81  03/31/21 (!) 177/88   Pulse Readings from Last 3 Encounters:  07/28/21 78  04/02/21 74  03/31/21 66   Wt Readings from Last 3 Encounters:  07/28/21 206  lb 9.6 oz (93.7 kg)  04/01/21 208 lb 1.8 oz (94.4 kg)  03/17/21 208 lb 3.2 oz (94.4 kg)   BMI Readings from Last 3 Encounters:  07/28/21 34.38 kg/m  04/01/21 34.63 kg/m  03/17/21 34.65 kg/m    Assessment/Interventions: Review of patient past medical history, allergies, medications, health status, including review of consultants reports, laboratory and other test data, was performed as part of comprehensive evaluation and provision of chronic care management services.   SDOH:  (Social Determinants of Health) assessments and interventions performed: Yes SDOH Interventions    Flowsheet Row Most Recent Value  SDOH Interventions   Financial Strain Interventions Intervention Not Indicated      SDOH Screenings   Alcohol Screen: Low Risk    Last Alcohol Screening Score (AUDIT): 0  Depression (  PHQ2-9): Low Risk    PHQ-2 Score: 0  Financial Resource Strain: Low Risk    Difficulty of Paying Living Expenses: Not hard at all  Food Insecurity: No Food Insecurity   Worried About Charity fundraiser in the Last Year: Never true   Ran Out of Food in the Last Year: Never true  Housing: Low Risk    Last Housing Risk Score: 0  Physical Activity: Sufficiently Active   Days of Exercise per Week: 7 days   Minutes of Exercise per Session: 30 min  Social Connections: Socially Isolated   Frequency of Communication with Friends and Family: More than three times a week   Frequency of Social Gatherings with Friends and Family: Once a week   Attends Religious Services: Never   Marine scientist or Organizations: No   Attends Archivist Meetings: Never   Marital Status: Widowed  Stress: No Stress Concern Present   Feeling of Stress : Not at all  Tobacco Use: Medium Risk   Smoking Tobacco Use: Former   Smokeless Tobacco Use: Never   Passive Exposure: Not on file  Transportation Needs: No Transportation Needs   Lack of Transportation (Medical): No   Lack of Transportation  (Non-Medical): No    CCM Care Plan  Allergies  Allergen Reactions   Contrast Media [Iodinated Contrast Media]     ? angioedema   Lisinopril     ? angioedema   Allegra [Fexofenadine] Other (See Comments)    Makes pt nervous   Codeine Nausea And Vomiting   Nsaids     "stomach on fire feeling"    Prednisone     Causes elevation in blood pressure- ? All steroids cause same reaction   Pseudoephedrine Hcl Nausea And Vomiting   Zithromax [Azithromycin] Other (See Comments)    Burns stomach   Sulfa Antibiotics Rash    Medications Reviewed Today     Reviewed by Arcelia Jew, CMA (Certified Medical Assistant) on 07/28/21 at 1323  Med List Status: <None>   Medication Order Taking? Sig Documenting Provider Last Dose Status Informant  amLODipine (NORVASC) 10 MG tablet [id number] Yes Take 1 tablet (10 mg total) by mouth daily. [name], Charlene Brooke, NP Taking Active   atenolol (TENORMIN) 25 MG tablet [id number] Yes Take 1 tablet (25 mg total) by mouth daily. Flossie Buffy, NP Taking Active     Discontinued 09/30/20 1619 (Patient Transfer)   benzonatate (TESSALON) 200 MG capsule [id number] Yes Take by mouth. [provider] Taking Active   cefdinir (OMNICEF) 300 MG capsule [id number] Yes Take 300 mg by mouth 2 (two) times daily. [provider] Taking Active   diclofenac Sodium (VOLTAREN) 1 % GEL [id number] Yes APPLY 2 G TOPICALLY 4 (FOUR) TIMES DAILY. [name], Charlene Brooke, NP Taking Active   fluocinonide-emollient (LIDEX-E) 0.05 % cream [id number] Yes Apply 1 application topically 2 (two) times daily. [name], Charlene Brooke, NP Taking Active   gabapentin (NEURONTIN) 100 MG capsule [id number] Yes Take 2 capsules (200 mg total) by mouth at bedtime. [name], Charlene Brooke, NP Taking Active   hydrochlorothiazide (HYDRODIURIL) 25 MG tablet [id number] Yes Take 1 tablet (25 mg total) by mouth daily. Flossie Buffy, NP Taking Active   HYDROcodone-acetaminophen (NORCO) 7.5-325 MG tablet  [id number] Yes Take 1 tablet by mouth every 8 (eight) hours. No additional refill without office visit [name], Charlene Brooke, NP Taking Active   Meth-Hyo-M Bl-Na Phos-Ph Sal (URO-MP) 118 MG CAPS [id number] Yes Take 1 capsule by mouth  3 (three) times daily as needed. [provider] Taking Active   phenazopyridine (PYRIDIUM) 200 MG tablet [id number] Yes Take 1 tablet (200 mg total) by mouth 3 (three) times daily as needed for pain. Lajean Manes, MD Taking Active             Patient Active Problem List   Diagnosis Date Noted   Hyperglycemia 09/25/2020   Atherosclerosis of aorta ([place]) 09/25/2020   Opioid type dependence, continuous ([place]) 08/15/2019   Chronic pain 08/11/2017   Hearing loss 11/23/2016   SVT (supraventricular tachycardia) (HCC)    Angio-edema    Carpal tunnel syndrome 09/19/2014   Eczema 09/19/2014   Chronic interstitial cystitis 05/20/2014   Lumbar radiculopathy, chronic 12/31/2013   DDD (degenerative disc disease), lumbosacral 04/07/2011   Vitamin D deficiency 10/03/2009   HLD (hyperlipidemia) 07/16/2009   Hypertensive disorder 07/16/2009    Immunization History  Administered Date(s) Administered   Influenza Split 05/29/2012   Influenza, High Dose Seasonal PF 05/24/2018, 05/29/2019   Influenza,inj,Quad PF,6+ Mos 06/23/2016, 05/27/2017   Influenza-Unspecified 06/05/2020, 06/05/2021   PFIZER(Purple Top)SARS-COV-2 Vaccination 10/28/2019, 12/08/2019   Pneumococcal Conjugate-13 11/03/2015   Pneumococcal Polysaccharide-23 07/14/2006, 05/29/2012   Td 07/11/2008   Tdap 04/23/2014   Zoster, Live 10/27/2010    Conditions to be addressed/monitored:  Hypertension, Hyperlipidemia, and Chronic Pain  Care Plan : General Pharmacy (Adult)  Updates made by Germaine Pomfret, RPH since 09/25/2021 12:00 AM     Problem: Hypertension, Hyperlipidemia, and Chronic Pain   Priority: High     Long-Range Goal: Patient-Specific Goal   Start Date: 09/25/2021  Expected End  Date: 09/26/2022  This Visit's Progress: On track  Priority: High  Note:   Current Barriers:  Unable to achieve control of cholesterol   Pharmacist Clinical Goal(s):  Patient will achieve control of cholesterol as evidenced by LDL less than 70 through collaboration with PharmD and provider.   Interventions: 1:1 collaboration with [name], Charlene Brooke, NP regarding development and update of comprehensive plan of care as evidenced by provider attestation and co-signature Inter-disciplinary care team collaboration (see longitudinal plan of care) Comprehensive medication review performed; medication list updated in electronic medical record  Hypertension (BP goal <140/90) -Controlled -Current treatment: Amlodipine 10 mg daily: Appropriate, Effective, Safe, Accessible Atenolol 25 mg daily: Appropriate, Effective, Safe, Accessible  HCTZ 25 mg daily: Appropriate, Effective, Safe, Accessible  Skips due diuretic effect.  -Medications previously tried: Clonidine, Furosemide, lisinopril (Angioedema) , nebivolol  -Current home readings: 143/87 (before medications)  -Current dietary habits: Mainly drinks water. Limits salt. Purchases frozen vegetables.  -Current exercise habits: Tries to stay active and walk, but is limited by back pain.  -Denies hypotensive/hypertensive symptoms -Recommended to continue current medication  Hyperlipidemia: (LDL goal < 100) -Uncontrolled -Current treatment: None -Medications previously tried: Atorvastatin (never filled), Rosuvastatin  -Stopped rosuvastatin, headaches and dizziness which has improved since stopping.  -Patient amenable to retry atorvastatin 20 mg twice weekly  Chronic pain (Goal: Minimize pain) -Controlled -Back Pain.  -Current treatment  Gabapentin 100 mg 2 caps nghtly: Appropriate, Effective, Safe, Accessible  Sometimes will take 2 capsules twice daily if pain is severe Hydrocodone-APAP 7.5-325 mg every 8 hours as needed: Appropriate,  Effective, Query Safe -Medications previously tried: NA  -Rates pain as 9-10/10 without medication, pain is 6-7 without pain medication.  -Recommended to continue current medication  Patient Goals/Self-Care Activities Patient will:  - check blood pressure weekly, document, and provide at future appointments  Follow Up Plan: Telephone follow up appointment with  care management team member scheduled for:  12/10/2021 at 10:00 AM      Medication Assistance: None required.  Patient affirms current coverage meets needs.  Compliance/Adherence/Medication fill history: Care Gaps: Shingrix Vaccine COVID-19 Vaccine HTN: 140/82 on 07/28/2021  Star-Rating Drugs: Rosuvastatin 10 mg last filled 08/07/2021 84 day supply at at [facility] outpatient pharmacy.  Patient's preferred pharmacy is:  [facility] [address] [place] [address] Phone: [phone] Fax: [phone]  Uses pill box? Yes Pt endorses 100% compliance  We discussed: Current pharmacy is preferred with insurance plan and patient is satisfied with pharmacy services Patient decided to: Continue current medication management strategy  Care Plan and Follow Up Patient Decision:  Patient agrees to Care Plan and Follow-up.  Plan: Telephone follow up appointment with care management team member scheduled for:  12/10/2021 at 10:00 AM  Junius Argyle, PharmD, Para March, CPP Clinical Pharmacist Practitioner  [facility] at [facility]  [phone]

## 2021-09-21 ENCOUNTER — Other Ambulatory Visit (HOSPITAL_COMMUNITY): Payer: Self-pay

## 2021-09-22 DIAGNOSIS — I10 Essential (primary) hypertension: Secondary | ICD-10-CM

## 2021-09-22 DIAGNOSIS — E782 Mixed hyperlipidemia: Secondary | ICD-10-CM

## 2021-09-25 ENCOUNTER — Other Ambulatory Visit (HOSPITAL_COMMUNITY): Payer: Self-pay

## 2021-09-25 NOTE — Patient Instructions (Signed)
Visit Information It was great speaking with you today!  Please let me know if you have any questions about our visit.   Goals Addressed             This Visit's Progress    Track and Manage My Blood Pressure-Hypertension       Timeframe:  Long-Range Goal Priority:  High Start Date:  09/17/21                           Expected End Date: 09/17/22                      Follow Up within 90 days   - check blood pressure weekly    Why is this important?   You won't feel high blood pressure, but it can still hurt your blood vessels.  High blood pressure can cause heart or kidney problems. It can also cause a stroke.  Making lifestyle changes like losing a little weight or eating less salt will help.  Checking your blood pressure at home and at different times of the day can help to control blood pressure.  If the doctor prescribes medicine remember to take it the way the doctor ordered.  Call the office if you cannot afford the medicine or if there are questions about it.     Notes:         Patient Care Plan: General Pharmacy (Adult)     Problem Identified: Hypertension, Hyperlipidemia, and Chronic Pain   Priority: High     Long-Range Goal: Patient-Specific Goal   Start Date: 09/25/2021  Expected End Date: 09/26/2022  This Visit's Progress: On track  Priority: High  Note:   Current Barriers:  Unable to achieve control of cholesterol   Pharmacist Clinical Goal(s):  Patient will achieve control of cholesterol as evidenced by LDL less than 70 through collaboration with PharmD and provider.   Interventions: 1:1 collaboration with [name], Charlene Brooke, NP regarding development and update of comprehensive plan of care as evidenced by provider attestation and co-signature Inter-disciplinary care team collaboration (see longitudinal plan of care) Comprehensive medication review performed; medication list updated in electronic medical record  Hypertension (BP goal  <140/90) -Controlled -Current treatment: Amlodipine 10 mg daily: Appropriate, Effective, Safe, Accessible Atenolol 25 mg daily: Appropriate, Effective, Safe, Accessible  HCTZ 25 mg daily: Appropriate, Effective, Safe, Accessible  Skips due diuretic effect.  -Medications previously tried: Clonidine, Furosemide, lisinopril (Angioedema) , nebivolol  -Current home readings: 143/87 (before medications)  -Current dietary habits: Mainly drinks water. Limits salt. Purchases frozen vegetables.  -Current exercise habits: Tries to stay active and walk, but is limited by back pain.  -Denies hypotensive/hypertensive symptoms -Recommended to continue current medication  Hyperlipidemia: (LDL goal < 100) -Uncontrolled -Current treatment: None -Medications previously tried: Atorvastatin (never filled), Rosuvastatin  -Stopped rosuvastatin, headaches and dizziness which has improved since stopping.  -Patient amenable to retry atorvastatin 20 mg twice weekly  Chronic pain (Goal: Minimize pain) -Controlled -Back Pain.  -Current treatment  Gabapentin 100 mg 2 caps nghtly: Appropriate, Effective, Safe, Accessible  Sometimes will take 2 capsules twice daily if pain is severe Hydrocodone-APAP 7.5-325 mg every 8 hours as needed: Appropriate, Effective, Query Safe -Medications previously tried: NA  -Rates pain as 9-10/10 without medication, pain is 6-7 without pain medication.  -Recommended to continue current medication  Patient Goals/Self-Care Activities Patient will:  - check blood pressure weekly, document, and provide at future appointments  Follow Up Plan: Telephone follow up appointment with care management team member scheduled for:  12/10/2021 at 10:00 AM    Ms. Pernice was given information about Chronic Care Management services today including:  CCM service includes personalized support from designated clinical staff supervised by her physician, including individualized plan of care and  coordination with other care providers 24/7 contact phone numbers for assistance for urgent and routine care needs. Standard insurance, coinsurance, copays and deductibles apply for chronic care management only during months in which we provide at least 20 minutes of these services. Most insurances cover these services at 100%, however patients may be responsible for any copay, coinsurance and/or deductible if applicable. This service may help you avoid the need for more expensive face-to-face services. Only one practitioner may furnish and bill the service in a calendar month. The patient may stop CCM services at any time (effective at the end of the month) by phone call to the office staff.  Patient agreed to services and verbal consent obtained.   Patient verbalizes understanding of instructions and care plan provided today and agrees to view in [facility]. Active MyChart status confirmed with patient.    Junius Argyle, PharmD, Para March, CPP Clinical Pharmacist Practitioner  [facility] at [facility]  [phone]

## 2021-10-06 ENCOUNTER — Ambulatory Visit: Payer: Medicare Other

## 2021-10-06 ENCOUNTER — Other Ambulatory Visit (HOSPITAL_COMMUNITY): Payer: Self-pay

## 2021-10-12 ENCOUNTER — Telehealth: Payer: Self-pay

## 2021-10-12 ENCOUNTER — Other Ambulatory Visit: Payer: Self-pay

## 2021-10-12 ENCOUNTER — Encounter: Payer: Self-pay | Admitting: Nurse Practitioner

## 2021-10-12 DIAGNOSIS — F1120 Opioid dependence, uncomplicated: Secondary | ICD-10-CM

## 2021-10-12 DIAGNOSIS — M4807 Spinal stenosis, lumbosacral region: Secondary | ICD-10-CM

## 2021-10-12 DIAGNOSIS — M5137 Other intervertebral disc degeneration, lumbosacral region: Secondary | ICD-10-CM

## 2021-10-12 DIAGNOSIS — G8929 Other chronic pain: Secondary | ICD-10-CM

## 2021-10-12 NOTE — Telephone Encounter (Signed)
Refill request for Hydrocodone ? ?PMP database last fills are ? ?09/16/21 Qty 90 ?09/07/21 Qty 21 ?08/07/21 Qty 90 ?07/08/21 Qty 90 ?

## 2021-10-12 NOTE — Progress Notes (Signed)
Per Clinical pharmacist, please reschedule patient appointment on 12/10/2021. ? ?Telephone follow up appointment with Care management team member was rescheduled for : 11/26/2021 at 3:00 pm. ? ?Heather Shaw ?Clinical Pharmacist Assistant ?[phone]  ?  ?

## 2021-10-13 ENCOUNTER — Other Ambulatory Visit (HOSPITAL_COMMUNITY): Payer: Self-pay

## 2021-10-13 MED ORDER — HYDROCODONE-ACETAMINOPHEN 7.5-325 MG PO TABS
1.0000 | ORAL_TABLET | Freq: Three times a day (TID) | ORAL | 0 refills | Status: DC
  Filled 2021-10-14: qty 90, 30d supply, fill #0

## 2021-10-14 ENCOUNTER — Other Ambulatory Visit (HOSPITAL_COMMUNITY): Payer: Self-pay

## 2021-10-14 ENCOUNTER — Ambulatory Visit (INDEPENDENT_AMBULATORY_CARE_PROVIDER_SITE_OTHER): Payer: Medicare Other

## 2021-10-14 DIAGNOSIS — I10 Essential (primary) hypertension: Secondary | ICD-10-CM

## 2021-10-14 DIAGNOSIS — Z0000 Encounter for general adult medical examination without abnormal findings: Secondary | ICD-10-CM

## 2021-10-14 DIAGNOSIS — Z780 Asymptomatic menopausal state: Secondary | ICD-10-CM | POA: Diagnosis not present

## 2021-10-14 DIAGNOSIS — E785 Hyperlipidemia, unspecified: Secondary | ICD-10-CM

## 2021-10-14 NOTE — Patient Instructions (Signed)
Heather Shaw , ?Thank you for taking time to come for your Medicare Wellness Visit. I appreciate your ongoing commitment to your health goals. Please review the following plan we discussed and let me know if I can assist you in the future.  ? ?Screening recommendations/referrals: ?Colonoscopy: no longer required  ?Mammogram: no longer required  ?Bone Density: 11/28/2009 ?Recommended yearly ophthalmology/optometry visit for glaucoma screening and checkup ?Recommended yearly dental visit for hygiene and checkup ? ?Vaccinations: ?Influenza vaccine: completed  ?Pneumococcal vaccine: completed  ?Tdap vaccine: 04/23/2014 ?Shingles vaccine: will consider    ? ?Advanced directives: none  ? ?Conditions/risks identified: none  ? ?Next appointment: none  ? ? ?Preventive Care 79 Years and Older, Female ?Preventive care refers to lifestyle choices and visits with your health care provider that can promote health and wellness. ?What does preventive care include? ?A yearly physical exam. This is also called an annual well check. ?Dental exams once or twice a year. ?Routine eye exams. Ask your health care provider how often you should have your eyes checked. ?Personal lifestyle choices, including: ?Daily care of your teeth and gums. ?Regular physical activity. ?Eating a healthy diet. ?Avoiding tobacco and drug use. ?Limiting alcohol use. ?Practicing safe sex. ?Taking low-dose aspirin every day. ?Taking vitamin and mineral supplements as recommended by your health care provider. ?What happens during an annual well check? ?The services and screenings done by your health care provider during your annual well check will depend on your age, overall health, lifestyle risk factors, and family history of disease. ?Counseling  ?Your health care provider may ask you questions about your: ?Alcohol use. ?Tobacco use. ?Drug use. ?Emotional well-being. ?Home and relationship well-being. ?Sexual activity. ?Eating habits. ?History of falls. ?Memory  and ability to understand (cognition). ?Work and work Statistician. ?Reproductive health. ?Screening  ?You may have the following tests or measurements: ?Height, weight, and BMI. ?Blood pressure. ?Lipid and cholesterol levels. These may be checked every 5 years, or more frequently if you are over 79 years old. ?Skin check. ?Lung cancer screening. You may have this screening every year starting at age 79 if you have a 30-pack-year history of smoking and currently smoke or have quit within the past 15 years. ?Fecal occult blood test (FOBT) of the stool. You may have this test every year starting at age 66. ?Flexible sigmoidoscopy or colonoscopy. You may have a sigmoidoscopy every 5 years or a colonoscopy every 10 years starting at age 79. ?Hepatitis C blood test. ?Hepatitis B blood test. ?Sexually transmitted disease (STD) testing. ?Diabetes screening. This is done by checking your blood sugar (glucose) after you have not eaten for a while (fasting). You may have this done every 1-3 years. ?Bone density scan. This is done to screen for osteoporosis. You may have this done starting at age 79. ?Mammogram. This may be done every 1-2 years. Talk to your health care provider about how often you should have regular mammograms. ?Talk with your health care provider about your test results, treatment options, and if necessary, the need for more tests. ?Vaccines  ?Your health care provider may recommend certain vaccines, such as: ?Influenza vaccine. This is recommended every year. ?Tetanus, diphtheria, and acellular pertussis (Tdap, Td) vaccine. You may need a Td booster every 10 years. ?Zoster vaccine. You may need this after age 79. ?Pneumococcal 13-valent conjugate (PCV13) vaccine. One dose is recommended after age 27. ?Pneumococcal polysaccharide (PPSV23) vaccine. One dose is recommended after age 35. ?Talk to your health care provider about which screenings and  vaccines you need and how often you need them. ?This  information is not intended to replace advice given to you by your health care provider. Make sure you discuss any questions you have with your health care provider. ?Document Released: 08/08/2015 Document Revised: 03/31/2016 Document Reviewed: 05/13/2015 ?Elsevier Interactive Patient Education ? 2017 [place]. ? ?Fall Prevention in the Home ?Falls can cause injuries. They can happen to people of all ages. There are many things you can do to make your home safe and to help prevent falls. ?What can I do on the outside of my home? ?Regularly fix the edges of walkways and driveways and fix any cracks. ?Remove anything that might make you trip as you walk through a door, such as a raised step or threshold. ?Trim any bushes or trees on the path to your home. ?Use bright outdoor lighting. ?Clear any walking paths of anything that might make someone trip, such as rocks or tools. ?Regularly check to see if handrails are loose or broken. Make sure that both sides of any steps have handrails. ?Any raised decks and porches should have guardrails on the edges. ?Have any leaves, snow, or ice cleared regularly. ?Use sand or salt on walking paths during winter. ?Clean up any spills in your garage right away. This includes oil or grease spills. ?What can I do in the bathroom? ?Use night lights. ?Install grab bars by the toilet and in the tub and shower. Do not use towel bars as grab bars. ?Use non-skid mats or decals in the tub or shower. ?If you need to sit down in the shower, use a plastic, non-slip stool. ?Keep the floor dry. Clean up any water that spills on the floor as soon as it happens. ?Remove soap buildup in the tub or shower regularly. ?Attach bath mats securely with double-sided non-slip rug tape. ?Do not have throw rugs and other things on the floor that can make you trip. ?What can I do in the bedroom? ?Use night lights. ?Make sure that you have a light by your bed that is easy to reach. ?Do not use any sheets or  blankets that are too big for your bed. They should not hang down onto the floor. ?Have a firm chair that has side arms. You can use this for support while you get dressed. ?Do not have throw rugs and other things on the floor that can make you trip. ?What can I do in the kitchen? ?Clean up any spills right away. ?Avoid walking on wet floors. ?Keep items that you use a lot in easy-to-reach places. ?If you need to reach something above you, use a strong step stool that has a grab bar. ?Keep electrical cords out of the way. ?Do not use floor polish or wax that makes floors slippery. If you must use wax, use non-skid floor wax. ?Do not have throw rugs and other things on the floor that can make you trip. ?What can I do with my stairs? ?Do not leave any items on the stairs. ?Make sure that there are handrails on both sides of the stairs and use them. Fix handrails that are broken or loose. Make sure that handrails are as long as the stairways. ?Check any carpeting to make sure that it is firmly attached to the stairs. Fix any carpet that is loose or worn. ?Avoid having throw rugs at the top or bottom of the stairs. If you do have throw rugs, attach them to the floor with carpet tape. ?Make  sure that you have a light switch at the top of the stairs and the bottom of the stairs. If you do not have them, ask someone to add them for you. ?What else can I do to help prevent falls? ?Wear shoes that: ?Do not have high heels. ?Have rubber bottoms. ?Are comfortable and fit you well. ?Are closed at the toe. Do not wear sandals. ?If you use a stepladder: ?Make sure that it is fully opened. Do not climb a closed stepladder. ?Make sure that both sides of the stepladder are locked into place. ?Ask someone to hold it for you, if possible. ?Clearly mark and make sure that you can see: ?Any grab bars or handrails. ?First and last steps. ?Where the edge of each step is. ?Use tools that help you move around (mobility aids) if they are  needed. These include: ?Canes. ?Walkers. ?Scooters. ?Crutches. ?Turn on the lights when you go into a dark area. Replace any light bulbs as soon as they burn out. ?Set up your furniture so you have a clear path. A

## 2021-10-14 NOTE — Chronic Care Management (AMB) (Signed)
? ?  10/14/2021 ? ?Heather Shaw ?12/07/1940 ?[id number] ? ?Telephone call to patient to discuss / offer care management services. HIPAA verified.  ?[name] was given information about Care Management services today including:  ?Care Management services includes personalized support from designated clinical staff supervised by her physician, including individualized plan of care and coordination with other care providers ?24/7 contact phone numbers for assistance for urgent and routine care needs. ?The patient may stop case management services at any time by phone call to the office staff. ? ?Patient agreed to services and verbal consent obtained.   ? ?PLAN: RNCM will follow up with patient within 3 weeks.  ? ?Quinn Plowman RN,BSN,CCM ?RN Case Manager ?[place] ?[phone] ? ? ?

## 2021-10-14 NOTE — Progress Notes (Signed)
? ?Subjective:  ? Heather Shaw is a 81 y.o. female who presents for Medicare Annual (Subsequent) preventive examination. ? ?I connected with Gladstone Lighter today by telephone and verified that I am speaking with the correct person using two identifiers. ?Location patient: home ?Location provider: work ?Persons participating in the virtual visit: patient, provider. ?  ?I discussed the limitations, risks, security and privacy concerns of performing an evaluation and management service by telephone and the availability of in person appointments. I also discussed with the patient that there may be a patient responsible charge related to this service. The patient expressed understanding and verbally consented to this telephonic visit.  ?  ?Interactive audio and video telecommunications were attempted between this provider and patient, however failed, due to patient having technical difficulties OR patient did not have access to video capability.  We continued and completed visit with audio only. ? ?  ?Review of Systems    ? ?Cardiac Risk Factors include: advanced age (>66mn, >>43women);hypertension ? ?   ?Objective:  ?  ?Today's Vitals  ? ?There is no height or weight on file to calculate BMI. ? ? ?  10/14/2021  ?  2:28 PM 04/01/2021  ?  7:28 PM 09/30/2020  ?  1:34 PM 09/26/2019  ?  2:39 PM 11/16/2017  ?  1:14 PM 11/08/2016  ? 10:46 AM 08/23/2016  ?  6:40 AM  ?Advanced Directives  ?Does Patient Have a Medical Advance Directive? No No No No No No No  ?Would patient like information on creating a medical advance directive? No - Patient declined  Yes (MAU/Ambulatory/Procedural Areas - Information given) No - Patient declined No - Patient declined  No - Patient declined  ? ? ?Current Medications (verified) ?Outpatient Encounter Medications as of 10/14/2021  ?Medication Sig  ? amLODipine (NORVASC) 10 MG tablet Take 1 tablet (10 mg total) by mouth daily.  ? atenolol (TENORMIN) 25 MG tablet Take 1 tablet (25 mg total) by mouth daily.   ? atorvastatin (LIPITOR) 20 MG tablet Take 1 tablet by mouth 2 times a week for 1 month, then 1 tablet 3 times a week continuously  ? fluocinonide-emollient (LIDEX-E) 0.05 % cream Apply 1 application topically 2 (two) times daily.  ? gabapentin (NEURONTIN) 100 MG capsule Take 2 capsules (200 mg total) by mouth at bedtime.  ? hydrochlorothiazide (HYDRODIURIL) 25 MG tablet Take 1 tablet (25 mg total) by mouth daily.  ? HYDROcodone-acetaminophen (NORCO) 7.5-325 MG tablet Take 1 tablet by mouth every 8 (eight) hours.  ? meclizine (ANTIVERT) 25 MG tablet Take 25 mg by mouth 3 (three) times daily as needed for dizziness.  ? Meth-Hyo-M Bl-Na Phos-Ph Sal (URO-MP) 118 MG CAPS Take 1 capsule by mouth 3 (three) times daily as needed.  ? Vitamin D, Ergocalciferol, (DRISDOL) 1.25 MG (50000 UNIT) CAPS capsule Take 1 capsule (50,000 Units total) by mouth every 7 (seven) days.  ? ?No facility-administered encounter medications on file as of 10/14/2021.  ? ? ?Allergies (verified) ?Contrast media [iodinated contrast media], Lisinopril, Allegra [fexofenadine], Codeine, Nsaids, Prednisone, Pseudoephedrine hcl, Zithromax [azithromycin], and Sulfa antibiotics  ? ?History: ?Past Medical History:  ?Diagnosis Date  ? Arthritis   ? Bladder pain   ? Bradycardia   ? DDD (degenerative disc disease), lumbosacral   ? Grief 05/24/2018  ? Hyperlipidemia   ? Hyperplastic colon polyp   ? Hypertension   ? IC (interstitial cystitis)   ? Insomnia 04/06/2010  ? Qualifier: Diagnosis of  By: ADeborra MedinaMD, TDivide   ?  Internal hemorrhoids   ? Polyp of rectum   ? PONV (postoperative nausea and vomiting)   ? Right wrist tendonitis 08/24/2017  ? Squamous papilloma   ? in esophageal polyp  ? Uterine fibroid   ? Vertigo   ? Wears glasses   ? Wears partial dentures   ? ?Past Surgical History:  ?Procedure Laterality Date  ? COLONOSCOPY W/ POLYPECTOMY  2009  ? CYSTO WITH HYDRODISTENSION N/A 05/20/2014  ? Procedure: CYSTOSCOPY/HYDRODISTENSION, INSTILLATION OF CHLORPACTIN;   Surgeon: Bernestine Amass, MD;  Location: [facility];  Service: Urology;  Laterality: N/A;  ? CYSTO/  HYDRODISTENTION/  INSTILLATION CLORPACTIC  09-21-2010  ? DILATION AND CURETTAGE OF UTERUS  1988  ? KNEE ARTHROSCOPY Right 11-18-2009  ? TRIGGER FINGER RELEASE Right 08/23/2016  ? Procedure: RIGHT RING FINGER TRIGGER RELEASE;  Surgeon: Milly Jakob, MD;  Location: [place];  Service: Orthopedics;  Laterality: Right;  ? ?Family History  ?Problem Relation Age of Onset  ? Clotting disorder Mother 31  ? COPD Father   ? Heart attack Father 21  ? Emphysema Father   ? Stroke Paternal Aunt   ? Hypertension Sister   ? Hypertension Brother   ? ?Social History  ? ?Socioeconomic History  ? Marital status: Widowed  ?  Spouse name: Not on file  ? Number of children: 6  ? Years of education: Not on file  ? Highest education level: Not on file  ?Occupational History  ? Occupation: housewife  ?Tobacco Use  ? Smoking status: Former  ?  Types: Cigarettes  ?  Quit date: 05/17/1971  ?  Years since quitting: 50.4  ? Smokeless tobacco: Never  ?Substance and Sexual Activity  ? Alcohol use: No  ?  Alcohol/week: 0.0 standard drinks  ? Drug use: No  ? Sexual activity: Never  ?Other Topics Concern  ? Not on file  ?Social History Narrative  ? Not on file  ? ?Social Determinants of Health  ? ?Financial Resource Strain: Low Risk   ? Difficulty of Paying Living Expenses: Not hard at all  ?Food Insecurity: No Food Insecurity  ? Worried About Charity fundraiser in the Last Year: Never true  ? Ran Out of Food in the Last Year: Never true  ?Transportation Needs: No Transportation Needs  ? Lack of Transportation (Medical): No  ? Lack of Transportation (Non-Medical): No  ?Physical Activity: Inactive  ? Days of Exercise per Week: 0 days  ? Minutes of Exercise per Session: 0 min  ?Stress: No Stress Concern Present  ? Feeling of Stress : Not at all  ?Social Connections: Moderately Isolated  ? Frequency of Communication with  Friends and Family: Twice a week  ? Frequency of Social Gatherings with Friends and Family: Twice a week  ? Attends Religious Services: 1 to 4 times per year  ? Active Member of Clubs or Organizations: No  ? Attends Archivist Meetings: Never  ? Marital Status: Widowed  ? ? ?Tobacco Counseling ?Counseling given: Not Answered ? ? ?Clinical Intake: ? ?Pre-visit preparation completed: Yes ? ?Pain : No/denies pain ? ?  ? ?Nutritional Risks: None ?Diabetes: No ? ?How often do you need to have someone help you when you read instructions, pamphlets, or other written materials from your doctor or pharmacy?: 1 - Never ?What is the last grade level you completed in school?: 10grade ? ?Diabetic?no  ? ?Interpreter Needed?: No ? ?Information entered by :: [email or web address] ? ? ?Activities of Daily  Living ? ?  10/14/2021  ?  2:28 PM 10/14/2021  ?  2:24 PM  ?In your present state of health, do you have any difficulty performing the following activities:  ?Hearing? 0 0  ?Vision? 0 0  ?Difficulty concentrating or making decisions? 0 0  ?Walking or climbing stairs? 0 0  ?Dressing or bathing? 0 0  ?Doing errands, shopping? 0 0  ?Preparing Food and eating ? N N  ?Using the Toilet? N N  ?In the past six months, have you accidently leaked urine? N N  ?Do you have problems with loss of bowel control? N N  ?Managing your Medications? N N  ?Managing your Finances? N N  ?Housekeeping or managing your Housekeeping? N N  ? ? ?Patient Care Team: ?[name], Charlene Brooke, NP as PCP - General (Internal Medicine) ?Rana Snare, MD (Inactive) as Consulting Physician (Urology) ?Maisie Fus, MD as Consulting Physician (Obstetrics and Gynecology) ?Juluis Rainier as Consulting Physician (Optometry) ?Lavonna Monarch, MD as Consulting Physician (Dermatology) ?Germaine Pomfret, [facility] as Pharmacist (Pharmacist) ? ?Indicate any recent Medical Services you may have received from other than [facility] providers in the past year (date may be approximate). ? ?    ?Assessment:  ? This is a routine wellness examination for [facility]. ? ?Hearing/Vision screen ?Vision Screening - Comments:: Annual eye exams wears glasses  ? ?Dietary issues and exercise activities discuss

## 2021-10-23 DIAGNOSIS — I10 Essential (primary) hypertension: Secondary | ICD-10-CM

## 2021-10-23 DIAGNOSIS — E785 Hyperlipidemia, unspecified: Secondary | ICD-10-CM | POA: Diagnosis not present

## 2021-11-04 ENCOUNTER — Ambulatory Visit (INDEPENDENT_AMBULATORY_CARE_PROVIDER_SITE_OTHER): Payer: Medicare Other

## 2021-11-04 DIAGNOSIS — I10 Essential (primary) hypertension: Secondary | ICD-10-CM

## 2021-11-04 DIAGNOSIS — G8929 Other chronic pain: Secondary | ICD-10-CM

## 2021-11-04 DIAGNOSIS — E785 Hyperlipidemia, unspecified: Secondary | ICD-10-CM

## 2021-11-04 NOTE — Patient Instructions (Signed)
Visit Information  ? ?Thank you for taking time to visit with me today. Please don't hesitate to contact me if I can be of assistance to you before our next scheduled telephone appointment. ? ?Following are the goals we discussed today:  ?Take medications as prescribed   ?Attend all scheduled provider appointments ?Call pharmacy for medication refills 3-7 days in advance of running out of medications ?Call provider office for new concerns or questions  ?Review education articles sent to you in MyChart on Hypertension, High cholesterol and chronic pain ?Monitor blood pressure 2-3 times per week and record.  ? ?Our next appointment is by telephone on 12/16/21 at 1:00 pm ? ?Please call the care guide team at [phone] if you need to cancel or reschedule your appointment.  ? ?If you are experiencing a Mental Health or [place] or need someone to talk to, please call the Suicide and Crisis Lifeline: 988 ?call 1-800-273-TALK (toll free, 24 hour hotline)  ? ?Following is a copy of your full care plan:  ?Care Plan : RN plan of care  ?Updates made by Dannielle Karvonen, RN since 11/04/2021 12:00 AM  ?  ? ?Problem: Chronic disease management education and care coordination needs   ?Priority: High  ?  ? ?Long-Range Goal: Development of plan of care to address chronic disease management and care coordination needs.   ?Start Date: 11/04/2021  ?Expected End Date: 12/23/2021  ?Priority: High  ?Note:   ?Current Barriers:  ?Knowledge Deficits related to plan of care for management of HTN, HLD, and Chronic Pain  ?Patient states she monitors her blood pressure 2-3 times per week. She reports last primary care provider visit was 07/28/21. Patient reports having chronic back pain. She states she manages her pain with pain medication, ice, heat and rest.  ?RNCM Clinical Goal(s):  ?Patient will verbalize understanding of plan for management of HTN, HLD, and chronic pain as evidenced by patient self report and/ or notation in  chart. ?take all medications exactly as prescribed and will call provider for medication related questions as evidenced by patient self report and/ or notation in chart.     ?attend all scheduled medical appointments:   as evidenced by patient self report and / or notation in chart        ?continue to work with Consulting civil engineer and/or Social Worker to address care management and care coordination needs related to HTN, HLD, and Chronic pain as evidenced by adherence to CM Team Scheduled appointments     through collaboration with Consulting civil engineer, provider, and care team.  ? ?Interventions: ?1:1 collaboration with primary care provider regarding development and update of comprehensive plan of care as evidenced by provider attestation and co-signature ?Inter-disciplinary care team collaboration (see longitudinal plan of care) ?Evaluation of current treatment plan related to  self management and patient's adherence to plan as established by provider ? ? ?Chronic pain  (Status:  New goal.)  Long Term Goal ?Discussed plans with patient for ongoing care management follow up and provided patient with direct contact information for care management team ?Evaluation of current treatment plan related to chronic pain and patient's adherence to plan as established by provider ?Reviewed medications with patient and discussed importance of compliance ?Reviewed scheduled/upcoming provider appointments ?Discussed plans with patient for ongoing care management follow up and provided patient with direct contact information for care management team ?Provided chronic pain management education material  ? ?Hyperlipidemia Interventions:  (Status:  New goal.) Long Term Goal ?  Medication review performed; medication list updated in electronic medical record.  ?Counseled on importance of regular laboratory monitoring as prescribed ?Provided HLD educational materials ?Reviewed importance of limiting foods high in cholesterol ? ?Hypertension  Interventions:  (Status:  New goal.) Long Term Goal ?Last practice recorded BP readings:  ?BP Readings from Last 3 Encounters:  ?07/28/21 140/82  ?04/02/21 139/81  ?03/31/21 (!) 177/88  ?Most recent eGFR/CrCl: No results found for: EGFR  No components found for: CRCL ? ?Evaluation of current treatment plan related to hypertension self management and patient's adherence to plan as established by provider ?Reviewed medications with patient and discussed importance of compliance ?Discussed plans with patient for ongoing care management follow up and provided patient with direct contact information for care management team ?Advised patient, providing education and rationale, to monitor blood pressure daily and record, calling PCP for findings outside established parameters ?Reviewed scheduled/upcoming provider appointments  ?Provided hypertension education material  ? ?Patient Goals/Self-Care Activities: ?Take medications as prescribed   ?Attend all scheduled provider appointments ?Call pharmacy for medication refills 3-7 days in advance of running out of medications ?Call provider office for new concerns or questions  ?Review education articles sent to you in MyChart on Hypertension, High cholesterol and chronic pain ?  ?  ? ? ?Consent to CCM Services: ?Ms. Boshers was given information about Chronic Care Management services including:  ?CCM service includes personalized support from designated clinical staff supervised by her physician, including individualized plan of care and coordination with other care providers ?24/7 contact phone numbers for assistance for urgent and routine care needs. ?Service will only be billed when office clinical staff spend 20 minutes or more in a month to coordinate care. ?Only one practitioner may furnish and bill the service in a calendar month. ?The patient may stop CCM services at any time (effective at the end of the month) by phone call to the office staff. ?The patient will be  responsible for cost sharing (co-pay) of up to 20% of the service fee (after annual deductible is met). ? ?Patient agreed to services and verbal consent obtained.  ? ?Patient verbalizes understanding of instructions and care plan provided today and agrees to view in [place]. Active MyChart status confirmed with patient.   ? ?The patient has been provided with contact information for the care management team and has been advised to call with any health related questions or concerns.  ?The care management team will reach out to the patient again over the next 45 days.  ? ? ?Quinn Plowman RN,BSN,CCM ?RN Case Manager ?[place]  ?[phone] ? ? ?  ?

## 2021-11-04 NOTE — Chronic Care Management (AMB) (Signed)
?Chronic Care Management  ? ?CCM RN Visit Note ? ?11/04/2021 ?Name: Heather Shaw MRN: [id number] DOB: Jun 23, 1941 ? ?Subjective: ?Heather Shaw is a 81 y.o. year old female who is a primary care patient of [name], Charlene Brooke, NP. The care management team was consulted for assistance with disease management and care coordination needs.   ? ?Engaged with patient by telephone for initial visit in response to provider referral for case management and/or care coordination services.  ? ?Consent to Services:  ?The patient was given the following information about Chronic Care Management services today, agreed to services, and gave verbal consent: 1. CCM service includes personalized support from designated clinical staff supervised by the primary care provider, including individualized plan of care and coordination with other care providers 2. 24/7 contact phone numbers for assistance for urgent and routine care needs. 3. Service will only be billed when office clinical staff spend 20 minutes or more in a month to coordinate care. 4. Only one practitioner may furnish and bill the service in a calendar month. 5.The patient may stop CCM services at any time (effective at the end of the month) by phone call to the office staff. 6. The patient will be responsible for cost sharing (co-pay) of up to 20% of the service fee (after annual deductible is met). Patient agreed to services and consent obtained. ? ?Patient agreed to services and verbal consent obtained.  ? ?Assessment: Review of patient past medical history, allergies, medications, health status, including review of consultants reports, laboratory and other test data, was performed as part of comprehensive evaluation and provision of chronic care management services.  ? ?SDOH (Social Determinants of Health) assessments and interventions performed:  ?SDOH Interventions   ? ?Flowsheet Row Most Recent Value  ?SDOH Interventions   ?Food Insecurity Interventions  Intervention Not Indicated  ?Housing Interventions Intervention Not Indicated  ? ?  ?  ? ?[place] ? ?Allergies  ?Allergen Reactions  ? Contrast Media [Iodinated Contrast Media]   ?  ? angioedema  ? Lisinopril   ?  ? angioedema  ? Allegra [Fexofenadine] Other (See Comments)  ?  Makes pt nervous  ? Codeine Nausea And Vomiting  ? Nsaids   ?  "stomach on fire feeling" ?  ? Prednisone   ?  Causes elevation in blood pressure- ? All steroids cause same reaction  ? Pseudoephedrine Hcl Nausea And Vomiting  ? Zithromax [Azithromycin] Other (See Comments)  ?  Burns stomach  ? Sulfa Antibiotics Rash  ? ? ?Outpatient Encounter Medications as of 11/04/2021  ?Medication Sig Note  ? amLODipine (NORVASC) 10 MG tablet Take 1 tablet (10 mg total) by mouth daily.   ? atenolol (TENORMIN) 25 MG tablet Take 1 tablet (25 mg total) by mouth daily.   ? atorvastatin (LIPITOR) 20 MG tablet Take 1 tablet by mouth 2 times a week for 1 month, then 1 tablet 3 times a week continuously   ? fluocinonide-emollient (LIDEX-E) 0.05 % cream Apply 1 application topically 2 (two) times daily. 11/04/2021: Patient states she uses as needed.   ? gabapentin (NEURONTIN) 100 MG capsule Take 2 capsules (200 mg total) by mouth at bedtime.   ? hydrochlorothiazide (HYDRODIURIL) 25 MG tablet Take 1 tablet (25 mg total) by mouth daily.   ? HYDROcodone-acetaminophen (NORCO) 7.5-325 MG tablet Take 1 tablet by mouth every 8 (eight) hours.   ? meclizine (ANTIVERT) 25 MG tablet Take 25 mg by mouth 3 (three) times daily as needed for dizziness.   ?  Meth-Hyo-M Bl-Na Phos-Ph Sal (URO-MP) 118 MG CAPS Take 1 capsule by mouth 3 (three) times daily as needed.   ? Vitamin D, Ergocalciferol, (DRISDOL) 1.25 MG (50000 UNIT) CAPS capsule Take 1 capsule (50,000 Units total) by mouth every 7 (seven) days. (Patient not taking: Reported on 11/04/2021)   ? ?No facility-administered encounter medications on file as of 11/04/2021.  ? ? ?Patient Active Problem List  ? Diagnosis Date Noted   ? Hyperglycemia 09/25/2020  ? Atherosclerosis of aorta ([place]) 09/25/2020  ? Opioid type dependence, continuous ([place]) 08/15/2019  ? Chronic pain 08/11/2017  ? Hearing loss 11/23/2016  ? SVT (supraventricular tachycardia) ([place])   ? Angio-edema   ? Carpal tunnel syndrome 09/19/2014  ? Eczema 09/19/2014  ? Chronic interstitial cystitis 05/20/2014  ? Lumbar radiculopathy, chronic 12/31/2013  ? DDD (degenerative disc disease), lumbosacral 04/07/2011  ? Vitamin D deficiency 10/03/2009  ? HLD (hyperlipidemia) 07/16/2009  ? Hypertensive disorder 07/16/2009  ? ? ?Conditions to be addressed/monitored:HTN, HLD, and chronic pain ? ?Care Plan : RN plan of care  ?Updates made by Dannielle Karvonen, RN since 11/04/2021 12:00 AM  ?  ? ?Problem: Chronic disease management education and care coordination needs   ?Priority: High  ?  ? ?Long-Range Goal: Development of plan of care to address chronic disease management and care coordination needs.   ?Start Date: 11/04/2021  ?Expected End Date: 12/23/2021  ?Priority: High  ?Note:   ?Current Barriers:  ?Knowledge Deficits related to plan of care for management of HTN, HLD, and Chronic Pain  ?Patient states she monitors her blood pressure 2-3 times per week. She reports last primary care provider visit was 07/28/21. Patient reports having chronic back pain. She states she manages her pain with pain medication, ice, heat and rest.  ?RNCM Clinical Goal(s):  ?Patient will verbalize understanding of plan for management of HTN, HLD, and chronic pain as evidenced by patient self report and/ or notation in chart. ?take all medications exactly as prescribed and will call provider for medication related questions as evidenced by patient self report and/ or notation in chart.     ?attend all scheduled medical appointments:   as evidenced by patient self report and / or notation in chart        ?continue to work with Consulting civil engineer and/or Social Worker to address care management and care coordination needs  related to HTN, HLD, and Chronic pain as evidenced by adherence to CM Team Scheduled appointments     through collaboration with Consulting civil engineer, provider, and care team.  ? ?Interventions: ?1:1 collaboration with primary care provider regarding development and update of comprehensive plan of care as evidenced by provider attestation and co-signature ?Inter-disciplinary care team collaboration (see longitudinal plan of care) ?Evaluation of current treatment plan related to  self management and patient's adherence to plan as established by provider ? ? ?Chronic pain  (Status:  New goal.)  Long Term Goal ?Discussed plans with patient for ongoing care management follow up and provided patient with direct contact information for care management team ?Evaluation of current treatment plan related to chronic pain and patient's adherence to plan as established by provider ?Reviewed medications with patient and discussed importance of compliance ?Reviewed scheduled/upcoming provider appointments ?Discussed plans with patient for ongoing care management follow up and provided patient with direct contact information for care management team ?Provided chronic pain management education material  ? ?Hyperlipidemia Interventions:  (Status:  New goal.) Long Term Goal ?Medication review performed; medication list  updated in electronic medical record.  ?Counseled on importance of regular laboratory monitoring as prescribed ?Provided HLD educational materials ?Reviewed importance of limiting foods high in cholesterol ? ?Hypertension Interventions:  (Status:  New goal.) Long Term Goal ?Last practice recorded BP readings:  ?BP Readings from Last 3 Encounters:  ?07/28/21 140/82  ?04/02/21 139/81  ?03/31/21 (!) 177/88  ?Most recent eGFR/CrCl: No results found for: EGFR  No components found for: CRCL ? ?Evaluation of current treatment plan related to hypertension self management and patient's adherence to plan as established by  provider ?Reviewed medications with patient and discussed importance of compliance ?Discussed plans with patient for ongoing care management follow up and provided patient with direct contact information for care manag

## 2021-11-11 ENCOUNTER — Telehealth: Payer: Self-pay | Admitting: Nurse Practitioner

## 2021-11-11 ENCOUNTER — Other Ambulatory Visit: Payer: Self-pay

## 2021-11-11 ENCOUNTER — Other Ambulatory Visit (HOSPITAL_COMMUNITY): Payer: Self-pay

## 2021-11-11 DIAGNOSIS — M4807 Spinal stenosis, lumbosacral region: Secondary | ICD-10-CM

## 2021-11-11 DIAGNOSIS — M5137 Other intervertebral disc degeneration, lumbosacral region: Secondary | ICD-10-CM

## 2021-11-11 DIAGNOSIS — F1120 Opioid dependence, uncomplicated: Secondary | ICD-10-CM

## 2021-11-11 DIAGNOSIS — G8929 Other chronic pain: Secondary | ICD-10-CM

## 2021-11-11 MED ORDER — HYDROCODONE-ACETAMINOPHEN 7.5-325 MG PO TABS
1.0000 | ORAL_TABLET | Freq: Three times a day (TID) | ORAL | 0 refills | Status: DC
  Filled 2021-11-13: qty 90, 30d supply, fill #0

## 2021-11-11 NOTE — Telephone Encounter (Signed)
Caller Name: Ryann Servidio  ?Call back phone #: [phone] ? ?MEDICATION(S): Hydrocodone ? ? ?~~~Please advise patient/caregiver to allow 2-3 business days to process RX refills.  ?

## 2021-11-11 NOTE — Telephone Encounter (Signed)
PMP database has last refills on ? ?10/14/21 Qty 90 ?09/16/21 Qty 90 ?09/07/21 Qty 21 ?

## 2021-11-12 NOTE — Telephone Encounter (Signed)
Patient notified that RX sas sent to the pharmacy and thye will fill it on 11/13/21.  No questions.  Dm/cma ? ?

## 2021-11-13 ENCOUNTER — Other Ambulatory Visit (HOSPITAL_COMMUNITY): Payer: Self-pay

## 2021-11-22 DIAGNOSIS — I10 Essential (primary) hypertension: Secondary | ICD-10-CM | POA: Diagnosis not present

## 2021-11-22 DIAGNOSIS — E785 Hyperlipidemia, unspecified: Secondary | ICD-10-CM | POA: Diagnosis not present

## 2021-11-25 ENCOUNTER — Telehealth: Payer: Self-pay

## 2021-11-25 NOTE — Progress Notes (Signed)
Chronic Care Management APPOINTMENT REMINDER ? ? ?Heather Shaw was reminded to have all medications, supplements and any blood glucose and blood pressure readings available for review with Heather Shaw, Pharm. D, at her telephone visit on 11/26/2021 at 3:00 pm. ? ?Patient Confirm Appointment ?  ?Bessie Kellihan,CPA ?Clinical Pharmacist Assistant ?[phone]  ? ? ?

## 2021-11-26 ENCOUNTER — Ambulatory Visit (INDEPENDENT_AMBULATORY_CARE_PROVIDER_SITE_OTHER): Payer: Medicare Other

## 2021-11-26 DIAGNOSIS — G8929 Other chronic pain: Secondary | ICD-10-CM

## 2021-11-26 DIAGNOSIS — E785 Hyperlipidemia, unspecified: Secondary | ICD-10-CM

## 2021-11-26 NOTE — Progress Notes (Signed)
? ?Chronic Care Management ?Pharmacy Note ? ?11/26/2021 ?Name:  Heather Shaw MRN:  [id number] DOB:  [birth year] ? ?Summary: ?Patient presents for CCM follow-up.   ?-Reports indigestion with atorvastatin, so stopped consistently taking. Discussed management strategies and importance of adherence to atorvastatin.   ? ?Recommendations/Changes made from today's visit: ?-Restart atorvastatin after 1-2 week washout.  ? ?Plan: ?CPP follow-up 2 months ? ? ?Subjective: ?Heather Shaw is an 81 y.o. year old female who is a primary patient of [name], Charlene Brooke, NP.  The CCM team was consulted for assistance with disease management and care coordination needs.   ? ?Engaged with patient by telephone for initial visit in response to provider referral for pharmacy case management and/or care coordination services.  ? ?Consent to Services:  ?The patient was given the following information about Chronic Care Management services today, agreed to services, and gave verbal consent: 1. CCM service includes personalized support from designated clinical staff supervised by the primary care provider, including individualized plan of care and coordination with other care providers 2. 24/7 contact phone numbers for assistance for urgent and routine care needs. 3. Service will only be billed when office clinical staff spend 20 minutes or more in a month to coordinate care. 4. Only one practitioner may furnish and bill the service in a calendar month. 5.The patient may stop CCM services at any time (effective at the end of the month) by phone call to the office staff. 6. The patient will be responsible for cost sharing (co-pay) of up to 20% of the service fee (after annual deductible is met). Patient agreed to services and consent obtained. ? ?Patient Care Team: ?[name], Charlene Brooke, NP as PCP - General (Internal Medicine) ?Rana Snare, MD (Inactive) as Consulting Physician (Urology) ?Maisie Fus, MD as Consulting Physician  (Obstetrics and Gynecology) ?Juluis Rainier as Consulting Physician (Optometry) ?Lavonna Monarch, MD as Consulting Physician (Dermatology) ?Germaine Pomfret, [facility] as Pharmacist (Pharmacist) ? ?Recent office visits: ?07/28/2021 Charlott [name] NP (PCP) Start Rosuvastatin 10 mg 2 times weekly for 1 month, then titrate to 3 times weekly,Start Vitamin D to 50,000 every 7 days, Return in about 3 months  ?03/17/2021 Charlott [name] NP (PCP) No medication changes noted, Follow up 3 months ? ?Recent consult visits: ?04/21/2021 Celesta Gentile DPM (Podiatry) No Medication Changes noted ?03/31/2021 Colin Benton DO (Family Medicine) Start Molnupiravir 800 mg 2 times daily ?03/27/2021 Dr. Duanne Moron MD (Internal Medicine) Start Mucinex twice daily for 7-10 days,  ? ?Hospital visits: ?Medication Reconciliation was completed by comparing discharge summary, patient?s EMR and Pharmacy list, and upon discussion with patient. ?  ?Admitted to the hospital on 04/01/2021 due to Interstitial Cystitis without hematuria. Discharge date was 04/02/2021. Discharged from [facility].   ?  ?New?Medications Started at [facility] Discharge:?? ?-started None ?  ?Medication Changes at Hospital Discharge: ?-Changed None ?  ?Medications Discontinued at Hospital Discharge: ?-Stopped None ?  ?Medications that remain the same after Hospital Discharge:??  ?-All other medications will remain the same.   ? ? ?Objective: ? ?Lab Results  ?Component Value Date  ? CREATININE 0.70 07/28/2021  ? BUN 16 07/28/2021  ? GFR 82.79 07/28/2021  ? GFRNONAA >60 04/01/2021  ? GFRAA >60 01/25/2020  ? NA 139 07/28/2021  ? K 4.7 07/28/2021  ? CALCIUM 9.4 07/28/2021  ? CO2 24 07/28/2021  ? GLUCOSE 105 (H) 07/28/2021  ? ? ?Lab Results  ?Component Value Date/Time  ? HGBA1C 6.0 03/17/2021 11:49 AM  ?  HGBA1C 5.8 09/22/2020 02:57 PM  ? GFR 82.79 07/28/2021 02:15 PM  ? GFR 83.58 03/17/2021 11:49 AM  ?  ?Last diabetic Eye exam: No results found for: HMDIABEYEEXA  ?Last diabetic  Foot exam: No results found for: HMDIABFOOTEX  ? ?Lab Results  ?Component Value Date  ? CHOL 212 (H) 03/17/2021  ? HDL 40.60 03/17/2021  ? LDLCALC 138 (H) 03/17/2021  ? LDLDIRECT 151.0 09/22/2020  ? TRIG 166.0 (H) 03/17/2021  ? CHOLHDL 5 03/17/2021  ? ? ? ?  Latest Ref Rng & Units 04/01/2021  ?  8:13 PM 07/13/2019  ?  1:22 PM 05/24/2018  ? 10:12 AM  ?Hepatic Function  ?Total Protein 6.5 - 8.1 g/dL 7.3   6.9   6.9    ?Albumin 3.5 - 5.0 g/dL 4.0   4.2   4.2    ?AST 15 - 41 U/L '28   15   14    ' ?ALT 0 - 44 U/L '25   10   11    ' ?Alk Phosphatase 38 - 126 U/L 42   57   48    ?Total Bilirubin 0.3 - 1.2 mg/dL 0.7   0.4   0.4    ? ? ?Lab Results  ?Component Value Date/Time  ? TSH 2.17 07/28/2021 02:15 PM  ? TSH 1.41 05/24/2018 10:12 AM  ? ? ? ?  Latest Ref Rng & Units 04/01/2021  ?  8:13 PM 01/25/2020  ?  5:23 PM 05/24/2018  ? 10:12 AM  ?CBC  ?WBC 4.0 - 10.5 K/uL 4.6   8.0   9.4    ?Hemoglobin 12.0 - 15.0 g/dL 15.4   14.4   14.6    ?Hematocrit 36.0 - 46.0 % 44.3   43.7   42.1    ?Platelets 150 - 400 K/uL 174   230   244.0    ? ? ?Lab Results  ?Component Value Date/Time  ? VD25OH 7.22 (L) 07/28/2021 02:15 PM  ? VD25OH 41 10/03/2009 08:21 PM  ? ? ?Clinical ASCVD: No  ?The 10-year ASCVD risk score (Arnett DK, et al., 2019) is: 53.2% ?  Values used to calculate the score: ?    Age: 81 years ?    Sex: Female ?    Is Non-Hispanic African American: No ?    Diabetic: Yes ?    Tobacco smoker: No ?    Systolic Blood Pressure: 395 mmHg ?    Is BP treated: Yes ?    HDL Cholesterol: 40.6 mg/dL ?    Total Cholesterol: 212 mg/dL   ? ? ?  11/04/2021  ?  1:13 PM 10/14/2021  ?  2:28 PM 10/14/2021  ?  2:27 PM  ?Depression screen PHQ 2/9  ?Decreased Interest 0 0 0  ?Down, Depressed, Hopeless 0 0 0  ?PHQ - 2 Score 0 0 0  ?  ?Social History  ? ?Tobacco Use  ?Smoking Status Former  ? Types: Cigarettes  ? Quit date: 05/17/1971  ? Years since quitting: 50.5  ?Smokeless Tobacco Never  ? ?BP Readings from Last 3 Encounters:  ?07/28/21 140/82  ?04/02/21 139/81   ?03/31/21 (!) 177/88  ? ?Pulse Readings from Last 3 Encounters:  ?07/28/21 78  ?04/02/21 74  ?03/31/21 66  ? ?Wt Readings from Last 3 Encounters:  ?07/28/21 206 lb 9.6 oz (93.7 kg)  ?04/01/21 208 lb 1.8 oz (94.4 kg)  ?03/17/21 208 lb 3.2 oz (94.4 kg)  ? ?BMI Readings from Last 3 Encounters:  ?07/28/21 34.38  kg/m?  ?04/01/21 34.63 kg/m?  ?03/17/21 34.65 kg/m?  ? ? ?Assessment/Interventions: Review of patient past medical history, allergies, medications, health status, including review of consultants reports, laboratory and other test data, was performed as part of comprehensive evaluation and provision of chronic care management services.  ? ?SDOH:  (Social Determinants of Health) assessments and interventions performed: Yes ? ? ?SDOH Screenings  ? ?Alcohol Screen: Low Risk   ? Last Alcohol Screening Score (AUDIT): 0  ?Depression (PHQ2-9): Low Risk   ? PHQ-2 Score: 0  ?Financial Resource Strain: Low Risk   ? Difficulty of Paying Living Expenses: Not hard at all  ?Food Insecurity: No Food Insecurity  ? Worried About Charity fundraiser in the Last Year: Never true  ? Ran Out of Food in the Last Year: Never true  ?Housing: Low Risk   ? Last Housing Risk Score: 0  ?Physical Activity: Inactive  ? Days of Exercise per Week: 0 days  ? Minutes of Exercise per Session: 0 min  ?Social Connections: Moderately Isolated  ? Frequency of Communication with Friends and Family: Twice a week  ? Frequency of Social Gatherings with Friends and Family: Twice a week  ? Attends Religious Services: 1 to 4 times per year  ? Active Member of Clubs or Organizations: No  ? Attends Archivist Meetings: Never  ? Marital Status: Widowed  ?Stress: No Stress Concern Present  ? Feeling of Stress : Not at all  ?Tobacco Use: Medium Risk  ? Smoking Tobacco Use: Former  ? Smokeless Tobacco Use: Never  ? Passive Exposure: Not on file  ?Transportation Needs: No Transportation Needs  ? Lack of Transportation (Medical): No  ? Lack of  Transportation (Non-Medical): No  ? ? ?CCM Care Plan ? ?Allergies  ?Allergen Reactions  ? Contrast Media [Iodinated Contrast Media]   ?  ? angioedema  ? Lisinopril   ?  ? angioedema  ? Allegra [Fexofenadine] Other (See

## 2021-11-26 NOTE — Patient Instructions (Signed)
Visit Information ?It was great speaking with you today!  Please let me know if you have any questions about our visit. ? ? Goals Addressed   ? ?  ?  ?  ?  ? This Visit's Progress  ?  Track and Manage My Blood Pressure-Hypertension   On track  ?  Timeframe:  Long-Range Goal ?Priority:  High ?Start Date:  09/17/21                           ?Expected End Date: 09/17/22                     ? ?Follow Up within 90 days ?  ?- check blood pressure weekly  ?  ?Why is this important?   ?You won't feel high blood pressure, but it can still hurt your blood vessels.  ?High blood pressure can cause heart or kidney problems. It can also cause a stroke.  ?Making lifestyle changes like losing a little weight or eating less salt will help.  ?Checking your blood pressure at home and at different times of the day can help to control blood pressure.  ?If the doctor prescribes medicine remember to take it the way the doctor ordered.  ?Call the office if you cannot afford the medicine or if there are questions about it.   ?  ?Notes:  ?  ? ?  ? ? ?Patient Care Plan: General Pharmacy (Adult)  ?  ? ?Problem Identified: Hypertension, Hyperlipidemia, and Chronic Pain   ?Priority: High  ?  ? ?Long-Range Goal: Patient-Specific Goal   ?Start Date: 09/25/2021  ?Expected End Date: 09/26/2022  ?This Visit's Progress: On track  ?Recent Progress: On track  ?Priority: High  ?Note:   ?Current Barriers:  ?Unable to achieve control of cholesterol  ? ?Pharmacist Clinical Goal(s):  ?Patient will achieve control of cholesterol as evidenced by LDL less than 70 through collaboration with PharmD and provider.  ? ?Interventions: ?1:1 collaboration with [name], Charlene Brooke, NP regarding development and update of comprehensive plan of care as evidenced by provider attestation and co-signature ?Inter-disciplinary care team collaboration (see longitudinal plan of care) ?Comprehensive medication review performed; medication list updated in electronic medical  record ? ?Hypertension (BP goal <140/90) ?-Controlled ?-Current treatment: ?Amlodipine 10 mg daily: Appropriate, Effective, Safe, Accessible ?Atenolol 25 mg daily: Appropriate, Effective, Safe, Accessible  ?HCTZ 25 mg daily: Appropriate, Effective, Safe, Accessible  ?Skips due diuretic effect.  ?-Medications previously tried: Clonidine, Furosemide, lisinopril (Angioedema) , nebivolol  ?-Current home readings: 131/71, 159/85 ?-Current dietary habits: Mainly drinks water. Limits salt. Purchases frozen vegetables.  ?-Current exercise habits: Tries to stay active and walk, but is limited by back pain.  ?-Denies hypotensive/hypertensive symptoms ?-Recommended to continue current medication ? ?Hyperlipidemia: (LDL goal < 100) ?-Uncontrolled ?-Current treatment: ?Atorvastatin 20 mg three times weekly  ?-Medications previously tried: Atorvastatin (never filled), Rosuvastatin ?-Reports indigestion with atorvastatin, so stopped consistently taking. Discussed management strategies and importance of adherence to atorvastatin.  ?-Restart atorvastatin after 1-2 week washout.  ? ?Chronic pain (Goal: Minimize pain) ?-Controlled ?-Back Pain.  ?-Current treatment  ?Gabapentin 100 mg 2 caps nghtly: Appropriate, Effective, Safe, Accessible  ?Sometimes will take 2 capsules twice daily if pain is severe ?Hydrocodone-APAP 7.5-325 mg every 8 hours as needed: Appropriate, Effective, Query Safe ?-Medications previously tried: NA  ?-Rates pain as 9-10/10 without medication, pain is 6-7 without pain medication.  ?-Recommended to continue current medication ? ?Patient Goals/Self-Care Activities ?  Patient will:  ?- check blood pressure weekly, document, and provide at future appointments ? ?Follow Up Plan: Telephone follow up appointment with care management team member scheduled for:  03/18/2022 at 2:15 PM ?  ? ?Patient agreed to services and verbal consent obtained.  ? ?Patient verbalizes understanding of instructions and care plan provided  today and agrees to view in [place]. Active MyChart status confirmed with patient.   ? ?Junius Argyle, PharmD, BCACP, CPP ?Clinical Pharmacist Practitioner  ?[facility]  ?[phone]  ?

## 2021-11-30 ENCOUNTER — Other Ambulatory Visit (HOSPITAL_COMMUNITY): Payer: Self-pay

## 2021-12-10 ENCOUNTER — Telehealth: Payer: Medicare Other

## 2021-12-11 ENCOUNTER — Telehealth: Payer: Self-pay | Admitting: Nurse Practitioner

## 2021-12-11 ENCOUNTER — Other Ambulatory Visit (HOSPITAL_COMMUNITY): Payer: Self-pay

## 2021-12-11 DIAGNOSIS — F1120 Opioid dependence, uncomplicated: Secondary | ICD-10-CM

## 2021-12-11 DIAGNOSIS — G8929 Other chronic pain: Secondary | ICD-10-CM

## 2021-12-11 DIAGNOSIS — M5137 Other intervertebral disc degeneration, lumbosacral region: Secondary | ICD-10-CM

## 2021-12-11 DIAGNOSIS — M4807 Spinal stenosis, lumbosacral region: Secondary | ICD-10-CM

## 2021-12-11 MED ORDER — HYDROCODONE-ACETAMINOPHEN 7.5-325 MG PO TABS
1.0000 | ORAL_TABLET | Freq: Three times a day (TID) | ORAL | 0 refills | Status: DC
  Filled 2021-12-11: qty 90, 30d supply, fill #0

## 2021-12-11 NOTE — Telephone Encounter (Signed)
Caller Name: Kinsey Karch  Call back phone #: [phone]  MEDICATION(S): Rx #: [id number]  HYDROcodone-acetaminophen ([place]) 7.5-325 MG tablet [[id number]]    Days of Med Remaining: she will be out on Saturday  Has the patient contacted their pharmacy (YES/NO)?  Yes contact your pcp IF YES, when and what did the pharmacy advise?  IF NO, request that the patient contact the pharmacy for the refills in the future.             The pharmacy will send an electronic request (except for controlled medications).  Preferred Pharmacy: [facility]  [address], [place] [address]  Phone:  [phone]  Fax:  [phone]  DEA #:  [id number]  ~~~Please advise patient/caregiver to allow 2-3 business days to process RX refills.

## 2021-12-16 ENCOUNTER — Ambulatory Visit: Payer: Medicare Other

## 2021-12-16 DIAGNOSIS — I10 Essential (primary) hypertension: Secondary | ICD-10-CM

## 2021-12-16 DIAGNOSIS — G8929 Other chronic pain: Secondary | ICD-10-CM

## 2021-12-16 DIAGNOSIS — E785 Hyperlipidemia, unspecified: Secondary | ICD-10-CM

## 2021-12-16 NOTE — Chronic Care Management (AMB) (Signed)
Chronic Care Management   CCM RN Visit Note  12/16/2021 Name: Heather Shaw MRN: [id number] DOB: January 04, 1941  Subjective: Heather Shaw is a 81 y.o. year old female who is a primary care patient of [name], Charlene Brooke, NP. The care management team was consulted for assistance with disease management and care coordination needs.    Engaged with patient by telephone for follow up visit in response to provider referral for case management and/or care coordination services.   Consent to Services:  The patient was given information about Chronic Care Management services, agreed to services, and gave verbal consent prior to initiation of services.  Please see initial visit note for detailed documentation.   Patient agreed to services and verbal consent obtained.   Assessment: Review of patient past medical history, allergies, medications, health status, including review of consultants reports, laboratory and other test data, was performed as part of comprehensive evaluation and provision of chronic care management services.   SDOH (Social Determinants of Health) assessments and interventions performed:    CCM Care Plan  Allergies  Allergen Reactions   Contrast Media [Iodinated Contrast Media]     ? angioedema   Lisinopril     ? angioedema   Allegra [Fexofenadine] Other (See Comments)    Makes pt nervous   Codeine Nausea And Vomiting   Nsaids     "stomach on fire feeling"    Prednisone     Causes elevation in blood pressure- ? All steroids cause same reaction   Pseudoephedrine Hcl Nausea And Vomiting   Zithromax [Azithromycin] Other (See Comments)    Burns stomach   Sulfa Antibiotics Rash    Outpatient Encounter Medications as of 12/16/2021  Medication Sig Note   amLODipine (NORVASC) 10 MG tablet Take 1 tablet (10 mg total) by mouth daily.    atenolol (TENORMIN) 25 MG tablet Take 1 tablet (25 mg total) by mouth daily.    atorvastatin (LIPITOR) 20 MG tablet Take 1 tablet  by mouth 2 times a week for 1 month, then 1 tablet 3 times a week continuously (Patient not taking: Reported on 12/16/2021)    fluocinonide-emollient (LIDEX-E) 0.05 % cream Apply 1 application topically 2 (two) times daily. 11/04/2021: Patient states she uses as needed.    gabapentin (NEURONTIN) 100 MG capsule Take 2 capsules (200 mg total) by mouth at bedtime.    hydrochlorothiazide (HYDRODIURIL) 25 MG tablet Take 1 tablet (25 mg total) by mouth daily.    HYDROcodone-acetaminophen (NORCO) 7.5-325 MG tablet Take 1 tablet by mouth every 8 (eight) hours.    meclizine (ANTIVERT) 25 MG tablet Take 25 mg by mouth 3 (three) times daily as needed for dizziness.    Meth-Hyo-M Bl-Na Phos-Ph Sal (URO-MP) 118 MG CAPS Take 1 capsule by mouth 3 (three) times daily as needed.    Vitamin D, Ergocalciferol, (DRISDOL) 1.25 MG (50000 UNIT) CAPS capsule Take 1 capsule (50,000 Units total) by mouth every 7 (seven) days. (Patient not taking: Reported on 11/04/2021)    No facility-administered encounter medications on file as of 12/16/2021.    Patient Active Problem List   Diagnosis Date Noted   Hyperglycemia 09/25/2020   Atherosclerosis of aorta ([place]) 09/25/2020   Opioid type dependence, continuous ([place]) 08/15/2019   Chronic pain 08/11/2017   Hearing loss 11/23/2016   SVT (supraventricular tachycardia) (HCC)    Angio-edema    Carpal tunnel syndrome 09/19/2014   Eczema 09/19/2014   Chronic interstitial cystitis 05/20/2014   Lumbar radiculopathy, chronic 12/31/2013  DDD (degenerative disc disease), lumbosacral 04/07/2011   Vitamin D deficiency 10/03/2009   HLD (hyperlipidemia) 07/16/2009   Hypertensive disorder 07/16/2009    Conditions to be addressed/monitored:HTN, HLD, and chronic back pain  Care Plan : RN plan of care  Updates made by Dannielle Karvonen, RN since 12/16/2021 12:00 AM     Problem: Chronic disease management education and care coordination needs   Priority: High     Long-Range Goal:  Development of plan of care to address chronic disease management and care coordination needs.   Start Date: 11/04/2021  Expected End Date: 02/22/2022  Priority: High  Note:   Current Barriers:  Knowledge Deficits related to plan of care for management of HTN, HLD, and Chronic Pain  Patient states she had to discontinue her Lipitor because it caused her indigestion.  She reports her blood pressure today was 152/90.  She states it was elevated because she had not taken her blood pressure medication.  Patient reports her back pain is a 7/8.  She states she continues to take her pain medication and uses heat/ice and rest.    RNCM Clinical Goal(s):  Patient will verbalize understanding of plan for management of HTN, HLD, and chronic pain as evidenced by patient self report and/ or notation in chart. take all medications exactly as prescribed and will call provider for medication related questions as evidenced by patient self report and/ or notation in chart.     attend all scheduled medical appointments:   as evidenced by patient self report and / or notation in chart        continue to work with RN Care Manager and/or Social Worker to address care management and care coordination needs related to HTN, HLD, and Chronic pain as evidenced by adherence to CM Team Scheduled appointments     through collaboration with RN Care manager, provider, and care team.   Interventions: 1:1 collaboration with primary care provider regarding development and update of comprehensive plan of care as evidenced by provider attestation and co-signature Inter-disciplinary care team collaboration (see longitudinal plan of care) Evaluation of current treatment plan related to  self management and patient's adherence to plan as established by provider   Chronic back  pain  (Status:  Goal on track:  Yes.)  Long Term Goal Discussed plans with patient for ongoing care management follow up and provided patient with direct contact  information for care management team Evaluation of current treatment plan related to chronic pain and patient's adherence to plan as established by provider Reviewed medications with patient and discussed importance of compliance Reviewed scheduled/upcoming provider appointments Advised to continue to use conservative treatment for back pain ( ice, heat, rest)  Hyperlipidemia Interventions:  (Status:  Goal on track:  Yes.) Long Term Goal Medication review performed; medication list updated in electronic medical record.  Counseled on importance of regular laboratory monitoring as prescribed Advised patient to send message to her primary provider informing her she discontinued her cholesterol medication Reviewed importance of limiting foods high in cholesterol  Hypertension Interventions:  (Status:  Goal on track:  Yes.) Long Term Goal Last practice recorded BP readings:  BP Readings from Last 3 Encounters:  07/28/21 140/82  04/02/21 139/81  03/31/21 (!) 177/88  Most recent eGFR/CrCl: No results found for: EGFR  No components found for: CRCL  Evaluation of current treatment plan related to hypertension self management and patient's adherence to plan as established by provider Reviewed medications with patient and discussed importance of compliance  Discussed plans with patient for ongoing care management follow up and provided patient with direct contact information for care management team Advised patient, providing education and rationale, to monitor blood pressure daily and record, calling PCP for findings outside established parameters Reviewed scheduled/upcoming provider appointments  Advised to follow a low salt diet.   Patient Goals/Self-Care Activities: Continue to take medications as prescribed   Attend all scheduled provider appointments Call pharmacy for medication refills 3-7 days in advance of running out of medications Call provider office for new concerns or questions   Monitor blood pressure 2-3 times per week and record.  Follow a low salt diet.  Continue to use conservative measures for treatment of back pain ( ice, heat, rest)      Plan:The patient has been provided with contact information for the care management team and has been advised to call with any health related questions or concerns.  The care management team will reach out to the patient again over the next 45 days. Quinn Plowman RN,BSN,CCM RN Case Manager [place] [phone]

## 2021-12-16 NOTE — Patient Instructions (Signed)
Visit Information  Thank you for taking time to visit with me today. Please don't hesitate to contact me if I can be of assistance to you before our next scheduled telephone appointment.  Following are the goals we discussed today:  Continue to take medications as prescribed   Attend all scheduled provider appointments Call pharmacy for medication refills 3-7 days in advance of running out of medications Call provider office for new concerns or questions  Monitor blood pressure 2-3 times per week and record.  Follow a low salt diet.  Continue to use conservative measures for treatment of back pain ( ice, heat, rest)   Our next appointment is by telephone on 01/20/22 at 3:00 pm  Please call the care guide team at [phone] if you need to cancel or reschedule your appointment.   If you are experiencing a Mental Health or [place] or need someone to talk to, please call the Suicide and Crisis Lifeline: 988 call 1-800-273-TALK (toll free, 24 hour hotline)   Patient verbalizes understanding of instructions and care plan provided today and agrees to view in [place]. Active MyChart status and patient understanding of how to access instructions and care plan via MyChart confirmed with patient.     Quinn Plowman RN,BSN,CCM RN Case Manager [place] [phone]

## 2021-12-23 DIAGNOSIS — I10 Essential (primary) hypertension: Secondary | ICD-10-CM | POA: Diagnosis not present

## 2021-12-23 DIAGNOSIS — E785 Hyperlipidemia, unspecified: Secondary | ICD-10-CM | POA: Diagnosis not present

## 2021-12-23 DIAGNOSIS — Z87891 Personal history of nicotine dependence: Secondary | ICD-10-CM

## 2022-01-07 ENCOUNTER — Telehealth: Payer: Self-pay | Admitting: Nurse Practitioner

## 2022-01-07 DIAGNOSIS — G8929 Other chronic pain: Secondary | ICD-10-CM

## 2022-01-07 DIAGNOSIS — M5137 Other intervertebral disc degeneration, lumbosacral region: Secondary | ICD-10-CM

## 2022-01-07 DIAGNOSIS — F1120 Opioid dependence, uncomplicated: Secondary | ICD-10-CM

## 2022-01-07 DIAGNOSIS — M4807 Spinal stenosis, lumbosacral region: Secondary | ICD-10-CM

## 2022-01-07 NOTE — Telephone Encounter (Signed)
Pt is needing a refill on her  Rx #: [id number]  HYDROcodone-acetaminophen (NORCO) 7.5-325 MG tablet [[id number]]  [facility]  [address] [place] [address]  Phone:  [phone]  Fax:  [phone]  DEA #:  [id number]  Pt's # [phone]

## 2022-01-08 ENCOUNTER — Other Ambulatory Visit (HOSPITAL_COMMUNITY): Payer: Self-pay

## 2022-01-08 MED ORDER — HYDROCODONE-ACETAMINOPHEN 7.5-325 MG PO TABS
1.0000 | ORAL_TABLET | Freq: Three times a day (TID) | ORAL | 0 refills | Status: DC
  Filled 2022-01-11 – ????-??-?? (×2): qty 90, 30d supply, fill #0

## 2022-01-08 NOTE — Telephone Encounter (Signed)
Pt advised, appt scheduled 

## 2022-01-11 ENCOUNTER — Other Ambulatory Visit (HOSPITAL_COMMUNITY): Payer: Self-pay

## 2022-01-18 ENCOUNTER — Other Ambulatory Visit (HOSPITAL_BASED_OUTPATIENT_CLINIC_OR_DEPARTMENT_OTHER): Payer: Self-pay

## 2022-01-20 ENCOUNTER — Ambulatory Visit (INDEPENDENT_AMBULATORY_CARE_PROVIDER_SITE_OTHER): Payer: Medicare Other

## 2022-01-20 DIAGNOSIS — G8929 Other chronic pain: Secondary | ICD-10-CM

## 2022-01-20 DIAGNOSIS — E782 Mixed hyperlipidemia: Secondary | ICD-10-CM

## 2022-01-20 DIAGNOSIS — I10 Essential (primary) hypertension: Secondary | ICD-10-CM

## 2022-01-20 NOTE — Patient Instructions (Signed)
Visit Information [facility] Congratulations on achieving your goals! It was a pleasure working with you, and I hope you continue to make great strides in improving your health.  Thank you for allowing me to share the care management and care coordination services that are available to you as part of your health plan and services through your primary care provider and medical home. Please reach out to me at [phone] if the care management/care coordination team may be of assistance to you in the future.   Quinn Plowman RN,BSN,CCM RN Case Manager [place] [phone]

## 2022-01-20 NOTE — Chronic Care Management (AMB) (Signed)
Chronic Care Management   CCM RN Visit Note  01/20/2022 Name: Heather Shaw MRN: [id number] DOB: 12-04-1940  Subjective: Heather Shaw is a 81 y.o. year old female who is a primary care patient of [name], Heather Brooke, NP. The care management team was consulted for assistance with disease management and care coordination needs.    Engaged with patient by telephone for follow up visit in response to provider referral for case management and/or care coordination services.   Consent to Services:  The patient was given information about Chronic Care Management services, agreed to services, and gave verbal consent prior to initiation of services.  Please see initial visit note for detailed documentation.   Patient agreed to services and verbal consent obtained.   Assessment: Review of patient past medical history, allergies, medications, health status, including review of consultants reports, laboratory and other test data, was performed as part of comprehensive evaluation and provision of chronic care management services.   SDOH (Social Determinants of Health) assessments and interventions performed:    CCM Care Plan  Allergies  Allergen Reactions   Contrast Media [Iodinated Contrast Media]     ? angioedema   Lisinopril     ? angioedema   Allegra [Fexofenadine] Other (See Comments)    Makes pt nervous   Codeine Nausea And Vomiting   Nsaids     "stomach on fire feeling"    Prednisone     Causes elevation in blood pressure- ? All steroids cause same reaction   Pseudoephedrine Hcl Nausea And Vomiting   Zithromax [Azithromycin] Other (See Comments)    Burns stomach   Sulfa Antibiotics Rash    Outpatient Encounter Medications as of 01/20/2022  Medication Sig Note   amLODipine (NORVASC) 10 MG tablet Take 1 tablet (10 mg total) by mouth daily.    atenolol (TENORMIN) 25 MG tablet Take 1 tablet (25 mg total) by mouth daily.    atorvastatin (LIPITOR) 20 MG tablet Take 1 tablet  by mouth 2 times a week for 1 month, then 1 tablet 3 times a week continuously    fluocinonide-emollient (LIDEX-E) 0.05 % cream Apply 1 application topically 2 (two) times daily. 11/04/2021: Patient states she uses as needed.    gabapentin (NEURONTIN) 100 MG capsule Take 2 capsules (200 mg total) by mouth at bedtime.    hydrochlorothiazide (HYDRODIURIL) 25 MG tablet Take 1 tablet (25 mg total) by mouth daily.    HYDROcodone-acetaminophen (NORCO) 7.5-325 MG tablet Take 1 tablet by mouth every 8 (eight) hours. Need office visit for additional refills.    meclizine (ANTIVERT) 25 MG tablet Take 25 mg by mouth 3 (three) times daily as needed for dizziness.    Meth-Hyo-M Bl-Na Phos-Ph Sal (URO-MP) 118 MG CAPS Take 1 capsule by mouth 3 (three) times daily as needed.    Vitamin D, Ergocalciferol, (DRISDOL) 1.25 MG (50000 UNIT) CAPS capsule Take 1 capsule (50,000 Units total) by mouth every 7 (seven) days.    No facility-administered encounter medications on file as of 01/20/2022.    Patient Active Problem List   Diagnosis Date Noted   Hyperglycemia 09/25/2020   Atherosclerosis of aorta ([place]) 09/25/2020   Opioid type dependence, continuous ([place]) 08/15/2019   Chronic pain 08/11/2017   Hearing loss 11/23/2016   SVT (supraventricular tachycardia) (HCC)    Angio-edema    Carpal tunnel syndrome 09/19/2014   Eczema 09/19/2014   Chronic interstitial cystitis 05/20/2014   Lumbar radiculopathy, chronic 12/31/2013   DDD (degenerative disc disease), lumbosacral 04/07/2011  Vitamin D deficiency 10/03/2009   HLD (hyperlipidemia) 07/16/2009   Hypertensive disorder 07/16/2009    Conditions to be addressed/monitored:HTN, HLD, and Chronic back pain  Care Plan : RN plan of care  Updates made by Dannielle Karvonen, RN since 01/20/2022 12:00 AM     Problem: Chronic disease management education and care coordination needs   Priority: High     Long-Range Goal: Development of plan of care to address chronic  disease management and care coordination needs.   Start Date: 11/04/2021  Expected End Date: 02/22/2022  Priority: High  Note:   Goal met.  Case closed.  Current Barriers:  Knowledge Deficits related to plan of care for management of HTN, HLD, and Chronic Pain  Patient states she is doing well.  She reports her blood pressure ranges from 120/70's to 140's over 70's.  Patient states she started back on her Lipitor 2 days per week. Patient reports back pain is a 7.  She states this is manageable as long as she takes her pain medication and use ice/ heat.  Patient states she is able to still care for herself and manage household chores.  Discussed closing case. Patient agreed.  RNCM Clinical Goal(s):  Patient will verbalize understanding of plan for management of HTN, HLD, and chronic pain as evidenced by patient self report and/ or notation in chart. take all medications exactly as prescribed and will call provider for medication related questions as evidenced by patient self report and/ or notation in chart.     attend all scheduled medical appointments:   as evidenced by patient self report and / or notation in chart        continue to work with RN Care Manager and/or Social Worker to address care management and care coordination needs related to HTN, HLD, and Chronic pain as evidenced by adherence to CM Team Scheduled appointments     through collaboration with RN Care manager, provider, and care team.   Interventions: 1:1 collaboration with primary care provider regarding development and update of comprehensive plan of care as evidenced by provider attestation and co-signature Inter-disciplinary care team collaboration (see longitudinal plan of care) Evaluation of current treatment plan related to  self management and patient's adherence to plan as established by provider   Chronic back  pain  (Status:  Goal Met.)   Discussed plans with patient for ongoing care management follow up and provided  patient with direct contact information for care management team Evaluation of current treatment plan related to chronic pain and patient's adherence to plan as established by provider Reviewed medications with patient and discussed importance of compliance Reviewed scheduled/upcoming provider appointments Advised to continue to use conservative treatment for back pain ( ice, heat, rest)  Hyperlipidemia Interventions:  (Status:  Goal Met.) Medication review performed; medication list updated in electronic medical record.  Counseled on importance of regular laboratory monitoring as prescribed Advised patient to send message to her primary provider informing her she discontinued her cholesterol medication Reviewed importance of limiting foods high in cholesterol  Hypertension Interventions:  (Status:  Goal Met.)  Last practice recorded BP readings:  BP Readings from Last 3 Encounters:  07/28/21 140/82  04/02/21 139/81  03/31/21 (!) 177/88  Most recent eGFR/CrCl: No results found for: EGFR  No components found for: CRCL  Evaluation of current treatment plan related to hypertension self management and patient's adherence to plan as established by provider Reviewed medications with patient and discussed importance of compliance Discussed plans with  patient for ongoing care management follow up and provided patient with direct contact information for care management team Advised patient, providing education and rationale, to monitor blood pressure daily and record, calling PCP for findings outside established parameters Reviewed scheduled/upcoming provider appointments  Advised to follow a low salt diet.   Patient Goals/Self-Care Activities: Continue to take medications as prescribed   Attend all scheduled provider appointments Call pharmacy for medication refills 3-7 days in advance of running out of medications Call provider office for new concerns or questions  Monitor blood pressure 2-3  times per week and record.  Follow a low salt diet.  Continue to use conservative measures for treatment of back pain ( ice, heat, rest)      Plan:No further follow up required: Goals met. Case closed Quinn Plowman RN,BSN,CCM RN Case Manager [place] [phone]

## 2022-01-22 DIAGNOSIS — I10 Essential (primary) hypertension: Secondary | ICD-10-CM

## 2022-01-22 DIAGNOSIS — E785 Hyperlipidemia, unspecified: Secondary | ICD-10-CM

## 2022-02-04 ENCOUNTER — Other Ambulatory Visit (HOSPITAL_COMMUNITY): Payer: Self-pay

## 2022-02-04 ENCOUNTER — Ambulatory Visit (INDEPENDENT_AMBULATORY_CARE_PROVIDER_SITE_OTHER): Payer: Medicare Other | Admitting: Nurse Practitioner

## 2022-02-04 ENCOUNTER — Encounter: Payer: Self-pay | Admitting: Nurse Practitioner

## 2022-02-04 VITALS — BP 122/82 | HR 45 | Temp 96.8°F | Ht 65.0 in | Wt 212.2 lb

## 2022-02-04 DIAGNOSIS — F1120 Opioid dependence, uncomplicated: Secondary | ICD-10-CM

## 2022-02-04 DIAGNOSIS — M25512 Pain in left shoulder: Secondary | ICD-10-CM

## 2022-02-04 DIAGNOSIS — R001 Bradycardia, unspecified: Secondary | ICD-10-CM

## 2022-02-04 DIAGNOSIS — M4807 Spinal stenosis, lumbosacral region: Secondary | ICD-10-CM

## 2022-02-04 DIAGNOSIS — R739 Hyperglycemia, unspecified: Secondary | ICD-10-CM | POA: Diagnosis not present

## 2022-02-04 DIAGNOSIS — M5137 Other intervertebral disc degeneration, lumbosacral region: Secondary | ICD-10-CM

## 2022-02-04 DIAGNOSIS — G8929 Other chronic pain: Secondary | ICD-10-CM

## 2022-02-04 DIAGNOSIS — E785 Hyperlipidemia, unspecified: Secondary | ICD-10-CM

## 2022-02-04 DIAGNOSIS — I10 Essential (primary) hypertension: Secondary | ICD-10-CM

## 2022-02-04 MED ORDER — HYDROCODONE-ACETAMINOPHEN 7.5-325 MG PO TABS
1.0000 | ORAL_TABLET | Freq: Three times a day (TID) | ORAL | 0 refills | Status: DC
  Filled 2022-02-10: qty 90, 30d supply, fill #0

## 2022-02-04 NOTE — Assessment & Plan Note (Signed)
Repeat hgbA1c 

## 2022-02-04 NOTE — Progress Notes (Signed)
Established Patient Visit  Patient: Heather Shaw   DOB: 04-25-1941   81 y.o. Female  MRN: [id number] Visit Date: 02/04/2022  Subjective:    Chief Complaint  Patient presents with   Office Visit    Medication refill, Pain & BP Left shoulder pain x 2 months  No other concerns    Shoulder Pain  The pain is present in the left shoulder. This is a recurrent problem. The current episode started more than 1 month ago. There has been no history of extremity trauma. The problem occurs constantly. The problem has been waxing and waning. The pain is moderate. Pertinent negatives include no fever, inability to bear weight, itching, joint locking, joint swelling or limited range of motion. The symptoms are aggravated by activity and lying down. She has tried nothing (unable to tolerate NSAIDs) for the symptoms. Family history does not include gout or rheumatoid arthritis. Her past medical history is significant for osteoarthritis.   Bradycardia HR 40-44: asymptomatic ECG: S-bradycardia, normal PR interval Hold atenolol Monitor BP and HR at home Get echocardiogram Check CBC, CMP, TSH  HLD (hyperlipidemia) Unable to tolerate daily dosing of statin Able to take atrovastatin '20mg'$  2x/week at hs but developed GERD.  Advised to take atorvastatin '20mg'$  2x/week after breakfast. Repeat lipid panel  Hypertensive disorder BP at goal but low HR. BP Readings from Last 3 Encounters:  02/04/22 122/82  07/28/21 140/82  04/02/21 139/81   Hold atenolol Continue amlodipine and HCTZ Check CMP, CBC, TSH, echocardiogram  Chronic pain Reviewed PMP database: no red flags UTD with UDS Pain controlled with 22MMSE daily. Denies any constipation or syncope or fall  Refill sent for 02/10/2022 Maintain f/up every 83month  Hyperglycemia Repeat hgbA1c  BP Readings from Last 3 Encounters:  02/04/22 122/82  07/28/21 140/82  04/02/21 139/81    Wt Readings from Last 3 Encounters:   02/04/22 212 lb 3.2 oz (96.3 kg)  07/28/21 206 lb 9.6 oz (93.7 kg)  04/01/21 208 lb 1.8 oz (94.4 kg)    Reviewed medical, surgical, and social history today  Medications: Outpatient Medications Prior to Visit  Medication Sig   amLODipine (NORVASC) 10 MG tablet Take 1 tablet (10 mg total) by mouth daily.   fluocinonide-emollient (LIDEX-E) 0.05 % cream Apply 1 application topically 2 (two) times daily.   gabapentin (NEURONTIN) 100 MG capsule Take 2 capsules (200 mg total) by mouth at bedtime.   hydrochlorothiazide (HYDRODIURIL) 25 MG tablet Take 1 tablet (25 mg total) by mouth daily.   meclizine (ANTIVERT) 25 MG tablet Take 25 mg by mouth 3 (three) times daily as needed for dizziness.   Meth-Hyo-M Bl-Na Phos-Ph Sal (URO-MP) 118 MG CAPS Take 1 capsule by mouth 3 (three) times daily as needed.   Vitamin D, Ergocalciferol, (DRISDOL) 1.25 MG (50000 UNIT) CAPS capsule Take 1 capsule (50,000 Units total) by mouth every 7 (seven) days.   [DISCONTINUED] atenolol (TENORMIN) 25 MG tablet Take 1 tablet (25 mg total) by mouth daily.   [DISCONTINUED] HYDROcodone-acetaminophen (NORCO) 7.5-325 MG tablet Take 1 tablet by mouth every 8 (eight) hours. Need office visit for additional refills.   atorvastatin (LIPITOR) 20 MG tablet Take 1 tablet by mouth 2 times a week for 1 month, then 1 tablet 3 times a week continuously (Patient not taking: Reported on 02/04/2022)   [DISCONTINUED] amLODipine (NORVASC) 10 MG tablet Take 1 tablet by mouth daily. (Patient not taking: Reported on  02/04/2022)   No facility-administered medications prior to visit.   Reviewed past medical and social history.   ROS per HPI above      Objective:  BP 122/82 (BP Location: Right Arm, Patient Position: Sitting, Cuff Size: Normal)   Pulse (!) 45   Temp (!) 96.8 F (36 C) (Temporal)   Ht '5\' 5"'$  (1.651 m)   Wt 212 lb 3.2 oz (96.3 kg)   SpO2 99%   BMI 35.31 kg/m      Physical Exam Constitutional:      Appearance: She is  obese.  Cardiovascular:     Rate and Rhythm: Normal rate and regular rhythm.     Pulses: Normal pulses.     Heart sounds: Normal heart sounds.  Pulmonary:     Effort: Pulmonary effort is normal.     Breath sounds: Normal breath sounds.  Abdominal:     General: Bowel sounds are normal.     Palpations: Abdomen is soft.  Musculoskeletal:        General: Tenderness present.     Right shoulder: Normal.     Left shoulder: Tenderness present. No swelling, deformity, effusion, laceration, bony tenderness or crepitus. Normal range of motion. Normal strength. Normal pulse.     Right upper arm: Normal.     Left upper arm: Normal.     Right elbow: Normal.     Left elbow: Normal.     Right hand: Normal.     Left hand: Normal.     Right lower leg: No edema.     Left lower leg: No edema.     Comments: Left posterior shoulder pain  Skin:    General: Skin is warm and dry.     Findings: No erythema or rash.  Neurological:     Mental Status: She is alert and oriented to person, place, and time.  Psychiatric:        Mood and Affect: Mood normal.        Behavior: Behavior normal.        Thought Content: Thought content normal.     No results found for any visits on 02/04/22.    Assessment & Plan:    Problem List Items Addressed This Visit       Cardiovascular and Mediastinum   Hypertensive disorder    BP at goal but low HR. BP Readings from Last 3 Encounters:  02/04/22 122/82  07/28/21 140/82  04/02/21 139/81   Hold atenolol Continue amlodipine and HCTZ Check CMP, CBC, TSH, echocardiogram        Musculoskeletal and Integument   DDD (degenerative disc disease), lumbosacral   Relevant Medications   HYDROcodone-acetaminophen (NORCO) 7.5-325 MG tablet (Start on 02/10/2022)     Other   Bradycardia - Primary    HR 40-44: asymptomatic ECG: S-bradycardia, normal PR interval Hold atenolol Monitor BP and HR at home Get echocardiogram Check CBC, CMP, TSH      Relevant Orders    EKG 12-Lead (Completed)   CBC   Comprehensive metabolic panel   ECHOCARDIOGRAM COMPLETE   Chronic pain    Reviewed PMP database: no red flags UTD with UDS Pain controlled with 22MMSE daily. Denies any constipation or syncope or fall  Refill sent for 02/10/2022 Maintain f/up every 69month      Relevant Medications   HYDROcodone-acetaminophen (NORCO) 7.5-325 MG tablet (Start on 02/10/2022)   HLD (hyperlipidemia)    Unable to tolerate daily dosing of statin Able to take atrovastatin '20mg'$  2x/week at  hs but developed GERD.  Advised to take atorvastatin '20mg'$  2x/week after breakfast. Repeat lipid panel      Relevant Orders   Lipid panel   Hyperglycemia    Repeat hgbA1c      Relevant Orders   Hemoglobin A1c   Opioid type dependence, continuous (HCC)   Relevant Medications   HYDROcodone-acetaminophen (NORCO) 7.5-325 MG tablet (Start on 02/10/2022)   Other Visit Diagnoses     Acute pain of left shoulder       Relevant Orders   AMB referral to orthopedics   Spinal stenosis of lumbosacral region       Relevant Medications   HYDROcodone-acetaminophen (NORCO) 7.5-325 MG tablet (Start on 02/10/2022)      Return in about 4 weeks (around 03/04/2022) for pain management and bradycardia.     Wilfred Lacy, NP

## 2022-02-04 NOTE — Assessment & Plan Note (Signed)
BP at goal but low HR. BP Readings from Last 3 Encounters:  02/04/22 122/82  07/28/21 140/82  04/02/21 139/81   Hold atenolol Continue amlodipine and HCTZ Check CMP, CBC, TSH, echocardiogram

## 2022-02-04 NOTE — Assessment & Plan Note (Signed)
HR 40-44: asymptomatic ECG: S-bradycardia, normal PR interval Hold atenolol Monitor BP and HR at home Get echocardiogram Check CBC, CMP, TSH

## 2022-02-04 NOTE — Assessment & Plan Note (Signed)
Unable to tolerate daily dosing of statin Able to take atrovastatin '20mg'$  2x/week at hs but developed GERD.  Advised to take atorvastatin '20mg'$  2x/week after breakfast. Repeat lipid panel

## 2022-02-04 NOTE — Patient Instructions (Addendum)
Try atorvastatin 2x/week: on Sunday and Thursday after breakfast. Let me know if GERD symptoms return.  Return to lab tomorrow for fasting blood draw (need to be fasting 8hrs prior to blood draw).  Go to ED is you develop dizziness, chest pain, shortness of breath.  Hold atenolol Monitor BP and heart rate daily at home Call office if BP >130/80 and HR>100

## 2022-02-04 NOTE — Assessment & Plan Note (Addendum)
Reviewed PMP database: no red flags UTD with UDS Pain controlled with 22MMSE daily. Denies any constipation or syncope or fall  Refill sent for 02/10/2022 Maintain f/up every 83month

## 2022-02-05 ENCOUNTER — Other Ambulatory Visit (INDEPENDENT_AMBULATORY_CARE_PROVIDER_SITE_OTHER): Payer: Medicare Other

## 2022-02-05 DIAGNOSIS — R739 Hyperglycemia, unspecified: Secondary | ICD-10-CM | POA: Diagnosis not present

## 2022-02-05 DIAGNOSIS — E785 Hyperlipidemia, unspecified: Secondary | ICD-10-CM | POA: Diagnosis not present

## 2022-02-05 DIAGNOSIS — R001 Bradycardia, unspecified: Secondary | ICD-10-CM | POA: Diagnosis not present

## 2022-02-05 LAB — CBC
HCT: 41.1 % (ref 36.0–46.0)
Hemoglobin: 14 g/dL (ref 12.0–15.0)
MCHC: 34 g/dL (ref 30.0–36.0)
MCV: 86.7 fl (ref 78.0–100.0)
Platelets: 217 10*3/uL (ref 150.0–400.0)
RBC: 4.74 Mil/uL (ref 3.87–5.11)
RDW: 13.3 % (ref 11.5–15.5)
WBC: 7.2 10*3/uL (ref 4.0–10.5)

## 2022-02-05 LAB — COMPREHENSIVE METABOLIC PANEL
ALT: 12 U/L (ref 0–35)
AST: 16 U/L (ref 0–37)
Albumin: 4.4 g/dL (ref 3.5–5.2)
Alkaline Phosphatase: 50 U/L (ref 39–117)
BUN: 20 mg/dL (ref 6–23)
CO2: 27 mEq/L (ref 19–32)
Calcium: 9.6 mg/dL (ref 8.4–10.5)
Chloride: 104 mEq/L (ref 96–112)
Creatinine, Ser: 0.85 mg/dL (ref 0.40–1.20)
GFR: 65.34 mL/min (ref >60.00)
Glucose, Bld: 104 mg/dL — ABNORMAL HIGH (ref 70–99)
Potassium: 3.7 mEq/L (ref 3.5–5.1)
Sodium: 140 mEq/L (ref 135–145)
Total Bilirubin: 0.4 mg/dL (ref 0.2–1.2)
Total Protein: 7.2 g/dL (ref 6.0–8.3)

## 2022-02-05 LAB — LIPID PANEL
Cholesterol: 207 mg/dL — ABNORMAL HIGH (ref 0–200)
HDL: 40.5 mg/dL (ref >39.00)
LDL Cholesterol: 143 mg/dL — ABNORMAL HIGH (ref 0–99)
NonHDL: 166.86
Total CHOL/HDL Ratio: 5
Triglycerides: 119 mg/dL (ref 0.0–149.0)
VLDL: 23.8 mg/dL (ref 0.0–40.0)

## 2022-02-05 LAB — HEMOGLOBIN A1C: Hgb A1c MFr Bld: 5.7 % (ref 4.6–6.5)

## 2022-02-09 ENCOUNTER — Other Ambulatory Visit (HOSPITAL_COMMUNITY): Payer: Self-pay

## 2022-02-09 DIAGNOSIS — H40013 Open angle with borderline findings, low risk, bilateral: Secondary | ICD-10-CM | POA: Diagnosis not present

## 2022-02-10 ENCOUNTER — Other Ambulatory Visit (HOSPITAL_COMMUNITY): Payer: Self-pay

## 2022-02-11 DIAGNOSIS — M19012 Primary osteoarthritis, left shoulder: Secondary | ICD-10-CM | POA: Diagnosis not present

## 2022-02-11 DIAGNOSIS — M25512 Pain in left shoulder: Secondary | ICD-10-CM | POA: Diagnosis not present

## 2022-02-12 ENCOUNTER — Other Ambulatory Visit (HOSPITAL_COMMUNITY): Payer: Self-pay

## 2022-02-18 ENCOUNTER — Ambulatory Visit (HOSPITAL_COMMUNITY): Payer: Medicare Other

## 2022-02-26 ENCOUNTER — Telehealth: Payer: Self-pay | Admitting: Nurse Practitioner

## 2022-02-26 ENCOUNTER — Other Ambulatory Visit (HOSPITAL_COMMUNITY): Payer: Self-pay

## 2022-02-26 ENCOUNTER — Other Ambulatory Visit: Payer: Self-pay | Admitting: Nurse Practitioner

## 2022-02-26 ENCOUNTER — Other Ambulatory Visit (HOSPITAL_COMMUNITY): Payer: Medicare Other

## 2022-02-26 DIAGNOSIS — I10 Essential (primary) hypertension: Secondary | ICD-10-CM

## 2022-02-26 DIAGNOSIS — I471 Supraventricular tachycardia: Secondary | ICD-10-CM

## 2022-02-26 MED ORDER — LOSARTAN POTASSIUM 25 MG PO TABS
25.0000 mg | ORAL_TABLET | Freq: Every day | ORAL | 5 refills | Status: DC
  Filled 2022-02-26: qty 30, 30d supply, fill #0

## 2022-02-26 NOTE — Telephone Encounter (Signed)
Hold atenolol Start losartan '25mg'$  daily for BP. New rx sent Continue to monitor BP and HR in AM. Send readings via mychart in 1week

## 2022-02-26 NOTE — Telephone Encounter (Signed)
Pt said dr Lorayne Marek taken pt off of blood pressure medicine but pt stated she start retaking the medication on her own so pt said that she need you to authorize the pharmacy to refill this medication again ( atenolol )

## 2022-02-26 NOTE — Telephone Encounter (Signed)
Pt advised.

## 2022-03-09 ENCOUNTER — Telehealth: Payer: Self-pay | Admitting: Nurse Practitioner

## 2022-03-09 NOTE — Telephone Encounter (Signed)
Pt needs her hydrocodone refilled she has enough till Thursday. She also needs a refill on her (she spelled) atenoll she said BP med. She is out of that pill. [facility] outpt.

## 2022-03-09 NOTE — Telephone Encounter (Signed)
Pt advised, says she will send in her BP readings tomorrow.

## 2022-03-10 ENCOUNTER — Other Ambulatory Visit (HOSPITAL_COMMUNITY): Payer: Self-pay

## 2022-03-10 ENCOUNTER — Other Ambulatory Visit: Payer: Self-pay | Admitting: Nurse Practitioner

## 2022-03-10 DIAGNOSIS — M4807 Spinal stenosis, lumbosacral region: Secondary | ICD-10-CM

## 2022-03-10 DIAGNOSIS — G8929 Other chronic pain: Secondary | ICD-10-CM

## 2022-03-10 DIAGNOSIS — F1120 Opioid dependence, uncomplicated: Secondary | ICD-10-CM

## 2022-03-10 DIAGNOSIS — M5137 Other intervertebral disc degeneration, lumbosacral region: Secondary | ICD-10-CM

## 2022-03-10 MED ORDER — HYDROCODONE-ACETAMINOPHEN 7.5-325 MG PO TABS
1.0000 | ORAL_TABLET | Freq: Three times a day (TID) | ORAL | 0 refills | Status: DC
  Filled 2022-03-12: qty 90, 30d supply, fill #0

## 2022-03-12 ENCOUNTER — Ambulatory Visit (HOSPITAL_COMMUNITY): Payer: Medicare Other | Attending: Cardiology

## 2022-03-12 ENCOUNTER — Other Ambulatory Visit (HOSPITAL_COMMUNITY): Payer: Self-pay

## 2022-03-12 ENCOUNTER — Telehealth: Payer: Self-pay | Admitting: Nurse Practitioner

## 2022-03-12 DIAGNOSIS — R001 Bradycardia, unspecified: Secondary | ICD-10-CM | POA: Diagnosis not present

## 2022-03-12 DIAGNOSIS — R9431 Abnormal electrocardiogram [ECG] [EKG]: Secondary | ICD-10-CM | POA: Diagnosis not present

## 2022-03-12 LAB — ECHOCARDIOGRAM COMPLETE
Area-P 1/2: 2.23 cm2
S' Lateral: 2.4 cm

## 2022-03-12 NOTE — Telephone Encounter (Signed)
Called pt & she states she has had her testing done and has had her questions answered.

## 2022-03-12 NOTE — Telephone Encounter (Signed)
Caller Name: Tyia Binford Call back phone #: [phone]  Reason for Call: Pt has a heart imaging test coming up, If you could please give her a call back to go over some questions she has

## 2022-03-15 ENCOUNTER — Telehealth: Payer: Self-pay | Admitting: Nurse Practitioner

## 2022-03-15 ENCOUNTER — Ambulatory Visit: Payer: Medicare Other | Admitting: Nurse Practitioner

## 2022-03-15 NOTE — Telephone Encounter (Signed)
Did not count as no show, no fee "Pt has a flat tire and no one to change it she rescheduled for Thursday at 1:40. YM"

## 2022-03-15 NOTE — Progress Notes (Deleted)
22.5MME per day with hydrocodone 7.'5mg'$  3x/day or switch to hydrocodone ER '10mg'$  every 12hrs with hydrocodone IR '5mg'$  daily prn?

## 2022-03-17 ENCOUNTER — Telehealth: Payer: Self-pay

## 2022-03-17 NOTE — Progress Notes (Signed)
Chronic Care Management APPOINTMENT REMINDER   Heather Shaw was reminded to have all medications, supplements and any blood glucose and blood pressure readings available for review with Junius Argyle, Pharm. D, at her telephone visit on 03/18/2022 at 9:30 am.  Patient confirm appointment.  [facility] Pharmacist Assistant [phone]

## 2022-03-18 ENCOUNTER — Ambulatory Visit (INDEPENDENT_AMBULATORY_CARE_PROVIDER_SITE_OTHER): Payer: Medicare Other

## 2022-03-18 ENCOUNTER — Ambulatory Visit (INDEPENDENT_AMBULATORY_CARE_PROVIDER_SITE_OTHER): Payer: Medicare Other | Admitting: Nurse Practitioner

## 2022-03-18 ENCOUNTER — Other Ambulatory Visit (HOSPITAL_COMMUNITY): Payer: Self-pay

## 2022-03-18 ENCOUNTER — Telehealth: Payer: Medicare Other

## 2022-03-18 ENCOUNTER — Encounter: Payer: Self-pay | Admitting: Nurse Practitioner

## 2022-03-18 VITALS — BP 132/70 | HR 105 | Temp 97.0°F | Ht 65.0 in | Wt 215.8 lb

## 2022-03-18 DIAGNOSIS — E782 Mixed hyperlipidemia: Secondary | ICD-10-CM | POA: Diagnosis not present

## 2022-03-18 DIAGNOSIS — I10 Essential (primary) hypertension: Secondary | ICD-10-CM

## 2022-03-18 DIAGNOSIS — G8929 Other chronic pain: Secondary | ICD-10-CM

## 2022-03-18 DIAGNOSIS — M5137 Other intervertebral disc degeneration, lumbosacral region: Secondary | ICD-10-CM

## 2022-03-18 DIAGNOSIS — M5416 Radiculopathy, lumbar region: Secondary | ICD-10-CM

## 2022-03-18 DIAGNOSIS — F1120 Opioid dependence, uncomplicated: Secondary | ICD-10-CM

## 2022-03-18 DIAGNOSIS — M4807 Spinal stenosis, lumbosacral region: Secondary | ICD-10-CM

## 2022-03-18 MED ORDER — ATORVASTATIN CALCIUM 20 MG PO TABS
20.0000 mg | ORAL_TABLET | ORAL | 3 refills | Status: DC
  Filled 2022-03-18: qty 12, 84d supply, fill #0

## 2022-03-18 MED ORDER — HYDROCODONE-ACETAMINOPHEN 7.5-325 MG PO TABS
1.0000 | ORAL_TABLET | Freq: Three times a day (TID) | ORAL | 0 refills | Status: DC
  Filled 2022-04-12: qty 90, 30d supply, fill #0

## 2022-03-18 NOTE — Progress Notes (Signed)
Established Patient Visit  Patient: Heather Shaw   DOB: February 18, 1941   81 y.o. Female  MRN: [id number] Visit Date: 03/18/2022  Subjective:    Chief Complaint  Patient presents with   Office Visit    Pain management & bradycardia f/u No other concerns    HPI Chronic pain Reports medication pharmaceutical company was changed to [employer]. This led to headache and poor pain control. Previous brand dispensed from [employer]. She states pain has been well controlled prior to 70monthago.  We discussed use of ER hydrocodone in place of current prescription, the pros and cons and possible side effects. She declined to switch to ER at this time. Up to date with UDS Updated contract today PMP database reviewed, no red flags. Advised to continue gabapentin and acetaminophen for additional pain control. Reminded about acetaminophen daily dose limit. Next refill due 04/12/2022 F/up in 334monthHypertensive disorder Resolved bradycardia with discontinuation of atenolol, but BP has gradually increased over the last 2weeks. Reports intermittent headache. Not sure if is due to BP or change in pain medication brand? Home BP: 130s-150s/70s-80s She takes amlodipine '10mg'$  daily and HCTZ prn for LE edema BP Readings from Last 3 Encounters:  03/18/22 132/70  02/04/22 122/82  07/28/21 140/82   Continue above medications Add losartan '25mg'$  daily Send BP readings in 2weeks [facility]. Cortisone injection Reviewed medical, surgical, and social history today  Medications: Outpatient Medications Prior to Visit  Medication Sig   amLODipine (NORVASC) 10 MG tablet Take 1 tablet (10 mg total) by mouth daily.   fluocinonide-emollient (LIDEX-E) 0.05 % cream Apply 1 application topically 2 (two) times daily.   gabapentin (NEURONTIN) 100 MG capsule Take 2 capsules (200 mg total) by mouth at bedtime.   hydrochlorothiazide  (HYDRODIURIL) 25 MG tablet Take 1 tablet (25 mg total) by mouth daily.   meclizine (ANTIVERT) 25 MG tablet Take 25 mg by mouth 3 (three) times daily as needed for dizziness.   Meth-Hyo-M Bl-Na Phos-Ph Sal (URO-MP) 118 MG CAPS Take 1 capsule by mouth 3 (three) times daily as needed.   Vitamin D, Ergocalciferol, (DRISDOL) 1.25 MG (50000 UNIT) CAPS capsule Take 1 capsule (50,000 Units total) by mouth every 7 (seven) days.   [DISCONTINUED] atorvastatin (LIPITOR) 20 MG tablet Take 1 tablet by mouth 2 times a week for 1 month, then 1 tablet 3 times a week continuously   [DISCONTINUED] HYDROcodone-acetaminophen (NORCO) 7.5-325 MG tablet Take 1 tablet by mouth every 8 (eight) hours.   losartan (COZAAR) 25 MG tablet Take 1 tablet (25 mg total) by mouth daily. (Patient not taking: Reported on 03/18/2022)   No facility-administered medications prior to visit.   Reviewed past medical and social history.   ROS per HPI above      Objective:  BP 132/70 (BP Location: Left Arm, Patient Position: Sitting, Cuff Size: Normal)   Pulse (!) 105   Temp (!) 97 F (36.1 C) (Temporal)   Ht '5\' 5"'$  (1.651 m)   Wt 215 lb 12.8 oz (97.9 kg)   SpO2 96%   BMI 35.91 kg/m      Physical Exam Cardiovascular:     Rate and Rhythm: Normal rate and regular rhythm.     Pulses: Normal pulses.     Heart sounds: Normal heart sounds.  Pulmonary:     Effort: Pulmonary effort is normal.  Musculoskeletal:  Right lower leg: No edema.     Left lower leg: No edema.  Neurological:     Mental Status: She is alert and oriented to person, place, and time.     No results found for any visits on 03/18/22.    Assessment & Plan:    Problem List Items Addressed This Visit       Cardiovascular and Mediastinum   Hypertensive disorder    Resolved bradycardia with discontinuation of atenolol, but BP has gradually increased over the last 2weeks. Reports intermittent headache. Not sure if is due to BP or change in pain medication  brand? Home BP: 130s-150s/70s-80s She takes amlodipine '10mg'$  daily and HCTZ prn for LE edema BP Readings from Last 3 Encounters:  03/18/22 132/70  02/04/22 122/82  07/28/21 140/82   Continue above medications Add losartan '25mg'$  daily Send BP readings in 2weeks F/up in 20month      Relevant Medications   atorvastatin (LIPITOR) 20 MG tablet     Musculoskeletal and Integument   DDD (degenerative disc disease), lumbosacral   Relevant Medications   NORCO 7.5-325 MG tablet (Start on 04/12/2022)     Other   Chronic pain - Primary    Reports medication pharmaceutical company was changed to [employer]. This led to headache and poor pain control. [facility] She states pain has been well controlled prior to 1120monthgo.  We discussed use of ER hydrocodone in place of current prescription, the pros and cons and possible side effects. She declined to switch to ER at this time. Up to date with UDS Updated contract today PMP database reviewed, no red flags. Advised to continue gabapentin and acetaminophen for additional pain control. Reminded about acetaminophen daily dose limit. Next refill due 04/12/2022 F/up in 20m6month   Relevant Medications   NORCO 7.5-325 MG tablet (Start on 04/12/2022)   HLD (hyperlipidemia)   Relevant Medications   atorvastatin (LIPITOR) 20 MG tablet   Opioid type dependence, continuous ([facility] Relevant Medications   NORCO 7.5-325 MG tablet (Start on 04/12/2022)   Other Visit Diagnoses     Spinal stenosis of lumbosacral region       Relevant Medications   NORCO 7.5-325 MG tablet (Start on 04/12/2022)      Return in about 3 months (around 06/18/2022) for HTN, Chronic pain and , hyperlipidemia (fasting).     ChaWilfred LacyP

## 2022-03-18 NOTE — Progress Notes (Addendum)
Chronic Care Management Pharmacy Note  03/18/2022 Name:  [name] MRN:  [id number] DOB:  11/20/1940  Summary: Patient presents for CCM follow-up.    -Home blood pressure has remained elevated since stopping atenolol. Patient had not started losartan, was concerned about starting a new medication.  -Rates pain as 9-10/10 without medication, pain is 8 currently with medication. Pain control has worsened significantly since running into issues with the medication manufacturer, feels current generic is much less effective.   Recommendations/Changes made from today's visit: -Recommended starting losartan 25 mg daily given lack of secondary indication for atenolol.   Plan: CPP follow-up 2 months  Subjective: Heather Shaw is an 81 y.o. year old female who is a primary patient of [name], Charlene Brooke, NP.  The CCM team was consulted for assistance with disease management and care coordination needs.    Engaged with patient by telephone for initial visit in response to provider referral for pharmacy case management and/or care coordination services.   Consent to Services:  The patient was given the following information about Chronic Care Management services today, agreed to services, and gave verbal consent: 1. CCM service includes personalized support from designated clinical staff supervised by the primary care provider, including individualized plan of care and coordination with other care providers 2. 24/7 contact phone numbers for assistance for urgent and routine care needs. 3. Service will only be billed when office clinical staff spend 20 minutes or more in a month to coordinate care. 4. Only one practitioner may furnish and bill the service in a calendar month. 5.The patient may stop CCM services at any time (effective at the end of the month) by phone call to the office staff. 6. The patient will be responsible for cost sharing (co-pay) of up to 20% of the service fee (after  annual deductible is met). Patient agreed to services and consent obtained.  Patient Care Team: [name], Charlene Brooke, NP as PCP - General (Internal Medicine) Rana Snare, MD (Inactive) as Consulting Physician (Urology) Maisie Fus, MD (Inactive) as Consulting Physician (Obstetrics and Gynecology) Juluis Rainier as Consulting Physician (Optometry) Lavonna Monarch, MD as Consulting Physician (Dermatology) Germaine Pomfret, [facility] as Pharmacist (Pharmacist)  Recent office visits: 07/28/2021 Charlott [name] NP (PCP) Start Rosuvastatin 10 mg 2 times weekly for 1 month, then titrate to 3 times weekly,Start Vitamin D to 50,000 every 7 days, Return in about 3 months  03/17/2021 Charlott [name] NP (PCP) No medication changes noted, Follow up 3 months  Recent consult visits: 04/21/2021 Celesta Gentile DPM (Podiatry) No Medication Changes noted 03/31/2021 Colin Benton DO (Family Medicine) Start Molnupiravir 800 mg 2 times daily 03/27/2021 Dr. Duanne Moron MD (Internal Medicine) Start Mucinex twice daily for 7-10 days,   Hospital visits: Medication Reconciliation was completed by comparing discharge summary, patient's EMR and Pharmacy list, and upon discussion with patient.   Admitted to the hospital on 04/01/2021 due to Interstitial Cystitis without hematuria. Discharge date was 04/02/2021. Discharged from [facility]?Medications Started at [facility] Discharge:?? -started None   Medication Changes at Hospital Discharge: -Changed None   Medications Discontinued at Hospital Discharge: -Stopped None   Medications that remain the same after Hospital Discharge:??  -All other medications will remain the same.     Objective:  Lab Results  Component Value Date   CREATININE 0.85 02/05/2022   BUN 20 02/05/2022   GFR 65.34 02/05/2022   GFRNONAA >60 04/01/2021   GFRAA >60  01/25/2020   NA 140 02/05/2022   K 3.7 02/05/2022   CALCIUM 9.6 02/05/2022   CO2 27 02/05/2022   GLUCOSE  104 (H) 02/05/2022    Lab Results  Component Value Date/Time   HGBA1C 5.7 02/05/2022 08:10 AM   HGBA1C 6.0 03/17/2021 11:49 AM   GFR 65.34 02/05/2022 08:10 AM   GFR 82.79 07/28/2021 02:15 PM    Last diabetic Eye exam: No results found for: "HMDIABEYEEXA"  Last diabetic Foot exam: No results found for: "HMDIABFOOTEX"   Lab Results  Component Value Date   CHOL 207 (H) 02/05/2022   HDL 40.50 02/05/2022   LDLCALC 143 (H) 02/05/2022   LDLDIRECT 151.0 09/22/2020   TRIG 119.0 02/05/2022   CHOLHDL 5 02/05/2022       Latest Ref Rng & Units 02/05/2022    8:10 AM 04/01/2021    8:13 PM 07/13/2019    1:22 PM  Hepatic Function  Total Protein 6.0 - 8.3 g/dL 7.2  7.3  6.9   Albumin 3.5 - 5.2 g/dL 4.4  4.0  4.2   AST 0 - 37 U/L '16  28  15   ' ALT 0 - 35 U/L '12  25  10   ' Alk Phosphatase 39 - 117 U/L 50  42  57   Total Bilirubin 0.2 - 1.2 mg/dL 0.4  0.7  0.4     Lab Results  Component Value Date/Time   TSH 2.17 07/28/2021 02:15 PM   TSH 1.41 05/24/2018 10:12 AM       Latest Ref Rng & Units 02/05/2022    8:10 AM 04/01/2021    8:13 PM 01/25/2020    5:23 PM  CBC  WBC 4.0 - 10.5 K/uL 7.2  4.6  8.0   Hemoglobin 12.0 - 15.0 g/dL 14.0  15.4  14.4   Hematocrit 36.0 - 46.0 % 41.1  44.3  43.7   Platelets 150.0 - 400.0 K/uL 217.0  174  230     Lab Results  Component Value Date/Time   VD25OH 7.22 (L) 07/28/2021 02:15 PM   VD25OH 41 10/03/2009 08:21 PM    Clinical ASCVD: No  The 10-year ASCVD risk score (Arnett DK, et al., 2019) is: 46.1%   Values used to calculate the score:     Age: 81 years     Sex: Female     Is Non-Hispanic African American: No     Diabetic: Yes     Tobacco smoker: No     Systolic Blood Pressure: 195 mmHg     Is BP treated: Yes     HDL Cholesterol: 40.5 mg/dL     Total Cholesterol: 207 mg/dL       11/04/2021    1:13 PM 10/14/2021    2:28 PM 10/14/2021    2:27 PM  Depression screen PHQ 2/9  Decreased Interest 0 0 0  Down, Depressed, Hopeless 0 0 0  PHQ - 2  Score 0 0 0    Social History   Tobacco Use  Smoking Status Former   Types: Cigarettes   Quit date: 05/17/1971   Years since quitting: 50.8  Smokeless Tobacco Never   BP Readings from Last 3 Encounters:  02/04/22 122/82  07/28/21 140/82  04/02/21 139/81   Pulse Readings from Last 3 Encounters:  02/04/22 (!) 45  07/28/21 78  04/02/21 74   Wt Readings from Last 3 Encounters:  02/04/22 212 lb 3.2 oz (96.3 kg)  07/28/21 206 lb 9.6 oz (93.7 kg)  04/01/21 208  lb 1.8 oz (94.4 kg)   BMI Readings from Last 3 Encounters:  02/04/22 35.31 kg/m  07/28/21 34.38 kg/m  04/01/21 34.63 kg/m    Assessment/Interventions: Review of patient past medical history, allergies, medications, health status, including review of consultants reports, laboratory and other test data, was performed as part of comprehensive evaluation and provision of chronic care management services.   SDOH:  (Social Determinants of Health) assessments and interventions performed: Yes   SDOH Screenings   Alcohol Screen: Low Risk  (10/14/2021)   Alcohol Screen    Last Alcohol Screening Score (AUDIT): 0  Depression (PHQ2-9): Low Risk  (11/04/2021)   Depression (PHQ2-9)    PHQ-2 Score: 0  Financial Resource Strain: Low Risk  (10/14/2021)   Overall Financial Resource Strain (CARDIA)    Difficulty of Paying Living Expenses: Not hard at all  Food Insecurity: No Food Insecurity (11/04/2021)   Hunger Vital Sign    Worried About Running Out of Food in the Last Year: Never true    Ran Out of Food in the Last Year: Never true  Housing: Low Risk  (11/04/2021)   Housing    Last Housing Risk Score: 0  Physical Activity: Inactive (10/14/2021)   Exercise Vital Sign    Days of Exercise per Week: 0 days    Minutes of Exercise per Session: 0 min  Social Connections: Moderately Isolated (10/14/2021)   Social Connection and Isolation Panel [NHANES]    Frequency of Communication with Friends and Family: Twice a week    Frequency of  Social Gatherings with Friends and Family: Twice a week    Attends Religious Services: 1 to 4 times per year    Active Member of [employer] or Organizations: No    Attends Archivist Meetings: Never    Marital Status: Widowed  Stress: No Stress Concern Present (10/14/2021)   [place]    Feeling of Stress : Not at all  Tobacco Use: Medium Risk (02/04/2022)   Patient History    Smoking Tobacco Use: Former    Smokeless Tobacco Use: Never    Passive Exposure: Not on file  Transportation Needs: No Transportation Needs (11/04/2021)   PRAPARE - Hydrologist (Medical): No    Lack of Transportation (Non-Medical): No    CCM Care Plan  Allergies  Allergen Reactions   Contrast Media [Iodinated Contrast Media]     ? angioedema   Lisinopril     ? angioedema   Allegra [Fexofenadine] Other (See Comments)    Makes pt nervous   Codeine Nausea And Vomiting   Nsaids     "stomach on fire feeling"    Prednisone     Causes elevation in blood pressure- ? All steroids cause same reaction   Pseudoephedrine Hcl Nausea And Vomiting   Zithromax [Azithromycin] Other (See Comments)    Burns stomach   Sulfa Antibiotics Rash    Medications Reviewed Today     Reviewed by Germaine Pomfret, [facility] (Pharmacist) on 03/18/22 at 1132  Med List Status: <None>   Medication Order Taking? Sig Documenting Provider Last Dose Status Informant  amLODipine (NORVASC) 10 MG tablet [id number] Yes Take 1 tablet (10 mg total) by mouth daily. [name], Charlene Brooke, NP Taking Active   atorvastatin (LIPITOR) 20 MG tablet [id number]  Take 1 tablet by mouth 2 times a week for 1 month, then 1 tablet 3 times a week continuously  Patient not taking: Reported  on 02/04/2022   Flossie Buffy, NP  Active   fluocinonide-emollient (LIDEX-E) 0.05 % cream [id number]  Apply 1 application topically 2 (two) times daily. [name], Charlene Brooke, NP   Active            Med Note Nyoka Cowden, DAVINA E   Wed Nov 04, 2021  1:09 PM) Patient states she uses as needed.   gabapentin (NEURONTIN) 100 MG capsule [id number]  Take 2 capsules (200 mg total) by mouth at bedtime. [name], Charlene Brooke, NP  Active   hydrochlorothiazide (HYDRODIURIL) 25 MG tablet [id number] Yes Take 1 tablet (25 mg total) by mouth daily. Flossie Buffy, NP Taking Active   HYDROcodone-acetaminophen (NORCO) 7.5-325 MG tablet [id number]  Take 1 tablet by mouth every 8 (eight) hours. [name], Charlene Brooke, NP  Active   losartan (COZAAR) 25 MG tablet [id number] No Take 1 tablet (25 mg total) by mouth daily.  Patient not taking: Reported on 03/18/2022   Flossie Buffy, NP Not Taking Active   meclizine (ANTIVERT) 25 MG tablet [id number]  Take 25 mg by mouth 3 (three) times daily as needed for dizziness. [provider]  Active   Meth-Hyo-M Bl-Na Phos-Ph Sal (URO-MP) 118 MG CAPS [id number]  Take 1 capsule by mouth 3 (three) times daily as needed. [provider]  Active   Vitamin D, Ergocalciferol, (DRISDOL) 1.25 MG (50000 UNIT) CAPS capsule [id number]  Take 1 capsule (50,000 Units total) by mouth every 7 (seven) days. Flossie Buffy, NP  Active             Patient Active Problem List   Diagnosis Date Noted   Angio-edema 03/27/2021   Hyperglycemia 09/25/2020   Atherosclerosis of aorta ([place]) 09/25/2020   Opioid type dependence, continuous ([place]) 08/15/2019   Chronic pain 08/11/2017   Hearing loss 11/23/2016   SVT (supraventricular tachycardia) (HCC)    Bradycardia 12/27/2015   Carpal tunnel syndrome 09/19/2014   Eczema 09/19/2014   Chronic interstitial cystitis 05/20/2014   Lumbar radiculopathy, chronic 12/31/2013   DDD (degenerative disc disease), lumbosacral 04/07/2011   Vitamin D deficiency 10/03/2009   HLD (hyperlipidemia) 07/16/2009   Hypertensive disorder 07/16/2009    Immunization History  Administered Date(s) Administered   Influenza Split  05/29/2012   Influenza, High Dose Seasonal PF 05/24/2018, 05/29/2019   Influenza,inj,Quad PF,6+ Mos 06/23/2016, 05/27/2017   Influenza-Unspecified 06/05/2020, 06/05/2021   PFIZER(Purple Top)SARS-COV-2 Vaccination 10/28/2019, 12/08/2019   Pneumococcal Conjugate-13 11/03/2015   Pneumococcal Polysaccharide-23 07/14/2006, 05/29/2012   Td 07/11/2008   Tdap 04/23/2014   Zoster, Live 10/27/2010    Conditions to be addressed/monitored:  Hypertension, Hyperlipidemia, and Chronic Pain  Care Plan : General Pharmacy (Adult)  Updates made by Germaine Pomfret, RPH since 03/18/2022 12:00 AM     Problem: Hypertension, Hyperlipidemia, and Chronic Pain   Priority: High     Long-Range Goal: Patient-Specific Goal   Start Date: 09/25/2021  Expected End Date: 09/26/2022  This Visit's Progress: On track  Recent Progress: On track  Priority: High  Note:   Current Barriers:  Unable to achieve control of cholesterol   Pharmacist Clinical Goal(s):  Patient will achieve control of cholesterol as evidenced by LDL less than 70 through collaboration with PharmD and provider.   Interventions: 1:1 collaboration with [name], Charlene Brooke, NP regarding development and update of comprehensive plan of care as evidenced by provider attestation and co-signature Inter-disciplinary care team collaboration (see longitudinal plan of care) Comprehensive medication review performed; medication list updated in electronic medical record  Hypertension (BP goal <140/90) -Controlled -Current treatment: Amlodipine 10 mg daily: Appropriate, Effective, Safe, Accessible HCTZ 25 mg daily: Appropriate, Effective, Safe, Accessible  Restarted taking after PCP visit in July   -Medications previously tried: Clonidine, Furosemide, lisinopril (Angioedema) , nebivolol  -Current home readings: 171/94 (had not taken medicine)  -Patient had not started losartan, was concerned about starting a new medication. -Current dietary habits:  Mainly drinks water. Limits salt. Purchases frozen vegetables.  -Current exercise habits: Tries to stay active and walk, but is limited by back pain.  -Denies hypotensive/hypertensive symptoms -Recommended starting losartan 25 mg daily given lack of secondary indication for atenolol.   Hyperlipidemia: (LDL goal < 100) -Uncontrolled -Current treatment: Atorvastatin 20 mg three times weekly  -Medications previously tried: Atorvastatin (never filled), Rosuvastatin -Reports indigestion with atorvastatin, so stopped consistently taking. Discussed management strategies and importance of adherence to atorvastatin.  -Restart atorvastatin after 1-2 week washout.   Chronic pain (Goal: Minimize pain) -Controlled -Back Pain.  -Current treatment  Gabapentin 100 mg 2 caps nghtly: Appropriate, Effective, Safe, Accessible  Sometimes will take 2 capsules twice daily if pain is severe Hydrocodone-APAP 7.5-325 mg every 8 hours as needed: Appropriate, Effective, Query Safe -Medications previously tried: NA  -Rates pain as 9-10/10 without medication, pain is 8 currently with medication. Pain control has worsened significantly since running into issues with the medication manufacturer, feels current generic is much less effective.  -Recommended to continue current medication  Patient Goals/Self-Care Activities Patient will:  - check blood pressure weekly, document, and provide at future appointments  Follow Up Plan: Telephone follow up appointment with care management team member scheduled for:  06/03/2022 at 10:00 AM     Medication Assistance: None required.  Patient affirms current coverage meets needs.  Compliance/Adherence/Medication fill history: Care Gaps: Shingrix Vaccine COVID-19 Vaccine HTN: 140/82 on 07/28/2021  Star-Rating Drugs: Rosuvastatin 10 mg last filled 08/07/2021 84 day supply at at [facility].  Patient's preferred pharmacy is:  [facility] [address] [place] [address] Phone: [phone] Fax: [phone]  Uses pill box? Yes Pt endorses 100% compliance  We discussed: Current pharmacy is preferred with insurance plan and patient is satisfied with pharmacy services Patient decided to: Continue current medication management strategy  Care Plan and Follow Up Patient Decision:  Patient agrees to Care Plan and Follow-up.  Plan: Telephone follow up appointment with care management team member scheduled for:  06/03/2022 at 10:00 AM  Junius Argyle, PharmD, Para March, CPP Clinical Pharmacist Practitioner  [facility] at [facility]  [phone]

## 2022-03-18 NOTE — Assessment & Plan Note (Addendum)
Reports medication pharmaceutical company was changed to [employer]. This led to headache and poor pain control. Previous brand dispensed from [employer]. She states pain has been well controlled prior to 34monthago.  We discussed use of ER hydrocodone in place of current prescription, the pros and cons and possible side effects. She declined to switch to ER at this time. Up to date with UDS Updated contract today PMP database reviewed, no red flags. Advised to continue gabapentin and acetaminophen for additional pain control. Reminded about acetaminophen daily dose limit. Next refill due 04/12/2022 F/up in 347month

## 2022-03-18 NOTE — Assessment & Plan Note (Signed)
Resolved bradycardia with discontinuation of atenolol, but BP has gradually increased over the last 2weeks. Reports intermittent headache. Not sure if is due to BP or change in pain medication brand? Home BP: 130s-150s/70s-80s She takes amlodipine '10mg'$  daily and HCTZ prn for LE edema BP Readings from Last 3 Encounters:  03/18/22 132/70  02/04/22 122/82  07/28/21 140/82   Continue above medications Add losartan '25mg'$  daily Send BP readings in 2weeks F/up in 49month

## 2022-03-18 NOTE — Patient Instructions (Signed)
Visit Information It was great speaking with you today!  Please let me know if you have any questions about our visit.   Goals Addressed             This Visit's Progress    Track and Manage My Blood Pressure-Hypertension   On track    Timeframe:  Long-Range Goal Priority:  High Start Date:  09/17/21                           Expected End Date: 09/17/22                      Follow Up within 90 days   - check blood pressure weekly    Why is this important?   You won't feel high blood pressure, but it can still hurt your blood vessels.  High blood pressure can cause heart or kidney problems. It can also cause a stroke.  Making lifestyle changes like losing a little weight or eating less salt will help.  Checking your blood pressure at home and at different times of the day can help to control blood pressure.  If the doctor prescribes medicine remember to take it the way the doctor ordered.  Call the office if you cannot afford the medicine or if there are questions about it.     Notes:         Patient Care Plan: General Pharmacy (Adult)     Problem Identified: Hypertension, Hyperlipidemia, and Chronic Pain   Priority: High     Long-Range Goal: Patient-Specific Goal   Start Date: 09/25/2021  Expected End Date: 09/26/2022  This Visit's Progress: On track  Recent Progress: On track  Priority: High  Note:   Current Barriers:  Unable to achieve control of cholesterol   Pharmacist Clinical Goal(s):  Patient will achieve control of cholesterol as evidenced by LDL less than 70 through collaboration with PharmD and provider.   Interventions: 1:1 collaboration with [name], Charlene Brooke, NP regarding development and update of comprehensive plan of care as evidenced by provider attestation and co-signature Inter-disciplinary care team collaboration (see longitudinal plan of care) Comprehensive medication review performed; medication list updated in electronic medical  record  Hypertension (BP goal <140/90) -Controlled -Current treatment: Amlodipine 10 mg daily: Appropriate, Effective, Safe, Accessible HCTZ 25 mg daily: Appropriate, Effective, Safe, Accessible  Restarted taking after PCP visit in July   -Medications previously tried: Clonidine, Furosemide, lisinopril (Angioedema) , nebivolol  -Current home readings: 171/94 (had not taken medicine)  -Patient had not started losartan, was concerned about starting a new medication. -Current dietary habits: Mainly drinks water. Limits salt. Purchases frozen vegetables.  -Current exercise habits: Tries to stay active and walk, but is limited by back pain.  -Denies hypotensive/hypertensive symptoms -Recommended starting losartan 25 mg daily given lack of secondary indication for atenolol.   Hyperlipidemia: (LDL goal < 100) -Uncontrolled -Current treatment: Atorvastatin 20 mg three times weekly  -Medications previously tried: Atorvastatin (never filled), Rosuvastatin -Reports indigestion with atorvastatin, so stopped consistently taking. Discussed management strategies and importance of adherence to atorvastatin.  -Restart atorvastatin after 1-2 week washout.   Chronic pain (Goal: Minimize pain) -Controlled -Back Pain.  -Current treatment  Gabapentin 100 mg 2 caps nghtly: Appropriate, Effective, Safe, Accessible  Sometimes will take 2 capsules twice daily if pain is severe Hydrocodone-APAP 7.5-325 mg every 8 hours as needed: Appropriate, Effective, Query Safe -Medications previously tried: NA  -Rates pain  as 9-10/10 without medication, pain is 8 currently with medication. Pain control has worsened significantly since running into issues with the medication manufacturer, feels current generic is much less effective.  -Recommended to continue current medication  Patient Goals/Self-Care Activities Patient will:  - check blood pressure weekly, document, and provide at future appointments  Follow Up  Plan: Telephone follow up appointment with care management team member scheduled for:  06/03/2022 at 10:00 AM    Patient agreed to services and verbal consent obtained.   Patient verbalizes understanding of instructions and care plan provided today and agrees to view in [place]. Active MyChart status and patient understanding of how to access instructions and care plan via MyChart confirmed with patient.     Junius Argyle, PharmD, Para March, CPP Clinical Pharmacist Practitioner  [facility] [facility] at [facility]  [phone]

## 2022-03-18 NOTE — Patient Instructions (Addendum)
Decrease atorvastatin '20mg'$   twice a week Start losartan '25mg'$  in Am  Continue Amlodipine '10mg'$  in PM and HCTZ '25mg'$  in AM as needed. Continue to monitor BP 2-3x/week in AM Send BP reading via mychart in 1-2week.

## 2022-03-21 ENCOUNTER — Encounter: Payer: Self-pay | Admitting: Nurse Practitioner

## 2022-03-21 DIAGNOSIS — I10 Essential (primary) hypertension: Secondary | ICD-10-CM

## 2022-03-25 ENCOUNTER — Other Ambulatory Visit (HOSPITAL_COMMUNITY): Payer: Self-pay

## 2022-03-25 DIAGNOSIS — E785 Hyperlipidemia, unspecified: Secondary | ICD-10-CM

## 2022-03-25 DIAGNOSIS — M19012 Primary osteoarthritis, left shoulder: Secondary | ICD-10-CM | POA: Diagnosis not present

## 2022-03-25 DIAGNOSIS — I10 Essential (primary) hypertension: Secondary | ICD-10-CM

## 2022-03-31 ENCOUNTER — Other Ambulatory Visit (HOSPITAL_COMMUNITY): Payer: Self-pay

## 2022-03-31 MED ORDER — LOSARTAN POTASSIUM 50 MG PO TABS
50.0000 mg | ORAL_TABLET | Freq: Every day | ORAL | 1 refills | Status: DC
  Filled 2022-03-31: qty 90, 90d supply, fill #0

## 2022-04-09 ENCOUNTER — Other Ambulatory Visit (HOSPITAL_COMMUNITY): Payer: Self-pay

## 2022-04-12 ENCOUNTER — Other Ambulatory Visit (HOSPITAL_COMMUNITY): Payer: Self-pay

## 2022-04-12 MED ORDER — LOSARTAN POTASSIUM 100 MG PO TABS
100.0000 mg | ORAL_TABLET | Freq: Every day | ORAL | 1 refills | Status: DC
  Filled 2022-04-12: qty 90, 90d supply, fill #0

## 2022-04-12 NOTE — Addendum Note (Signed)
Addended by: Leana Gamer on: 04/12/2022 08:38 AM   Modules accepted: Orders

## 2022-04-13 ENCOUNTER — Other Ambulatory Visit: Payer: Self-pay | Admitting: Nurse Practitioner

## 2022-04-13 ENCOUNTER — Other Ambulatory Visit (HOSPITAL_COMMUNITY): Payer: Self-pay

## 2022-04-13 DIAGNOSIS — I10 Essential (primary) hypertension: Secondary | ICD-10-CM

## 2022-04-13 MED ORDER — AMLODIPINE BESYLATE 10 MG PO TABS
10.0000 mg | ORAL_TABLET | Freq: Every day | ORAL | 3 refills | Status: DC
  Filled 2022-04-13: qty 90, 90d supply, fill #0

## 2022-04-13 MED ORDER — HYDROCHLOROTHIAZIDE 25 MG PO TABS
25.0000 mg | ORAL_TABLET | Freq: Every day | ORAL | 1 refills | Status: DC
  Filled 2022-04-13 – 2022-04-22 (×2): qty 90, 90d supply, fill #0

## 2022-04-13 NOTE — Telephone Encounter (Signed)
Chart supports Rx Last OV: 02/2022 Next OV: 05/2022  

## 2022-04-19 ENCOUNTER — Encounter: Payer: Self-pay | Admitting: Nurse Practitioner

## 2022-04-19 DIAGNOSIS — F1120 Opioid dependence, uncomplicated: Secondary | ICD-10-CM

## 2022-04-19 DIAGNOSIS — G8929 Other chronic pain: Secondary | ICD-10-CM

## 2022-04-19 DIAGNOSIS — M5137 Other intervertebral disc degeneration, lumbosacral region: Secondary | ICD-10-CM

## 2022-04-19 DIAGNOSIS — M4807 Spinal stenosis, lumbosacral region: Secondary | ICD-10-CM

## 2022-04-21 ENCOUNTER — Other Ambulatory Visit (HOSPITAL_COMMUNITY): Payer: Self-pay

## 2022-04-22 ENCOUNTER — Other Ambulatory Visit (HOSPITAL_COMMUNITY): Payer: Self-pay

## 2022-04-28 ENCOUNTER — Ambulatory Visit
Admission: RE | Admit: 2022-04-28 | Discharge: 2022-04-28 | Disposition: A | Discharge status: Home / Self Care | Payer: Medicare Other | Source: Ambulatory Visit | Attending: Nurse Practitioner | Admitting: Nurse Practitioner

## 2022-04-28 DIAGNOSIS — Z780 Asymptomatic menopausal state: Secondary | ICD-10-CM | POA: Diagnosis not present

## 2022-05-04 ENCOUNTER — Telehealth: Payer: Self-pay | Admitting: Nurse Practitioner

## 2022-05-04 ENCOUNTER — Ambulatory Visit: Payer: Medicare Other | Admitting: Nurse Practitioner

## 2022-05-04 NOTE — Telephone Encounter (Signed)
1st no show, fee waived, letter sent 

## 2022-05-04 NOTE — Telephone Encounter (Signed)
10.10.23 no show letter sent

## 2022-05-07 ENCOUNTER — Other Ambulatory Visit: Payer: Self-pay | Admitting: Nurse Practitioner

## 2022-05-07 DIAGNOSIS — F1120 Opioid dependence, uncomplicated: Secondary | ICD-10-CM

## 2022-05-07 DIAGNOSIS — M5416 Radiculopathy, lumbar region: Secondary | ICD-10-CM

## 2022-05-07 DIAGNOSIS — G8929 Other chronic pain: Secondary | ICD-10-CM

## 2022-05-07 DIAGNOSIS — M5137 Other intervertebral disc degeneration, lumbosacral region: Secondary | ICD-10-CM

## 2022-05-10 ENCOUNTER — Other Ambulatory Visit (HOSPITAL_COMMUNITY): Payer: Self-pay

## 2022-05-10 ENCOUNTER — Telehealth: Payer: Self-pay | Admitting: Nurse Practitioner

## 2022-05-10 MED ORDER — GABAPENTIN 100 MG PO CAPS
200.0000 mg | ORAL_CAPSULE | Freq: Every day | ORAL | 3 refills | Status: DC
  Filled 2022-05-10 – 2022-05-12 (×3): qty 90, 45d supply, fill #0
  Filled 2022-11-05: qty 90, 45d supply, fill #1

## 2022-05-10 MED ORDER — HYDROCODONE-ACETAMINOPHEN 7.5-325 MG PO TABS
1.0000 | ORAL_TABLET | Freq: Three times a day (TID) | ORAL | 0 refills | Status: DC
  Filled 2022-05-12: qty 90, 30d supply, fill #0

## 2022-05-10 NOTE — Telephone Encounter (Signed)
Caller Name: Yarieliz Call back phone #: [phone]  MEDICATION(S):  HYDROcodone-acetaminophen ([place]) 7.5-325 MG tablet  Days of Med Remaining:   Has the patient contacted their pharmacy (YES/NO)? no What did pharmacy advise?   Preferred Pharmacy:  [facility] [facility] Phone:  [phone]      ~~~Please advise patient/caregiver to allow 2-3 business days to process RX refills.

## 2022-05-10 NOTE — Telephone Encounter (Signed)
Chart supports Rx Last OV: 02/2022 Next OV: 04/2022

## 2022-05-10 NOTE — Addendum Note (Signed)
Addended by: Leana Gamer on: 05/10/2022 03:43 PM   Modules accepted: Orders

## 2022-05-11 ENCOUNTER — Other Ambulatory Visit (HOSPITAL_COMMUNITY): Payer: Self-pay

## 2022-05-12 ENCOUNTER — Other Ambulatory Visit (HOSPITAL_COMMUNITY): Payer: Self-pay

## 2022-05-14 NOTE — Progress Notes (Signed)
CARDIOLOGY CONSULT NOTE       Patient ID: Heather Shaw MRN: [id number] DOB/AGE: 1940/08/24 81 y.o.  Admit date: (Not on file) Referring Physician: [name] Primary Physician: Flossie Buffy, NP Primary Cardiologist: New Reason for Consultation: Bradycardia  Active Problems:   * No active hospital problems. *   HPI:  81 y.o. referred by [name] NP for bradycardia Chronic pain syndrome ( Lumbosacral spine )  on narcotics including hydrocodone Also using gabapentin and tylenol for pain control She had bradycardia on Atenolol but this resolved off it BP now using Amlodipine and HCTZ PRN for edema Losatran added 03/18/22 She is on lipitor for HLD   ***  ROS All other systems reviewed and negative except as noted above  Past Medical History:  Diagnosis Date  . Arthritis   . Bladder pain   . Bradycardia   . DDD (degenerative disc disease), lumbosacral   . Grief 05/24/2018  . Hyperlipidemia   . Hyperplastic colon polyp   . Hypertension   . IC (interstitial cystitis)   . Insomnia 04/06/2010   Qualifier: Diagnosis of  By: Deborra Medina MD, Tanja Port    . Internal hemorrhoids   . Polyp of rectum   . PONV (postoperative nausea and vomiting)   . Right wrist tendonitis 08/24/2017  . Squamous papilloma    in esophageal polyp  . Uterine fibroid   . Vertigo   . Wears glasses   . Wears partial dentures     Family History  Problem Relation Age of Onset  . Clotting disorder Mother 30  . COPD Father   . Heart attack Father 43  . Emphysema Father   . Stroke Paternal Aunt   . Hypertension Sister   . Hypertension Brother     Social History   Socioeconomic History  . Marital status: Widowed    Spouse name: Not on file  . Number of children: 6  . Years of education: Not on file  . Highest education level: Not on file  Occupational History  . Occupation: housewife  Tobacco Use  . Smoking status: Former    Types: Cigarettes    Quit date: 05/17/1971    Years since quitting: 51.0  .  Smokeless tobacco: Never  Vaping Use  . Vaping Use: Never used  Substance and Sexual Activity  . Alcohol use: No    Alcohol/week: 0.0 standard drinks of alcohol  . Drug use: No  . Sexual activity: Never  Other Topics Concern  . Not on file  Social History Narrative  . Not on file   Social Determinants of Health   Financial Resource Strain: Low Risk  (10/14/2021)   Overall Financial Resource Strain (CARDIA)   . Difficulty of Paying Living Expenses: Not hard at all  Food Insecurity: No Food Insecurity (11/04/2021)   Hunger Vital Sign   . Worried About Charity fundraiser in the Last Year: Never true   . Ran Out of Food in the Last Year: Never true  Transportation Needs: No Transportation Needs (11/04/2021)   PRAPARE - Transportation   . Lack of Transportation (Medical): No   . Lack of Transportation (Non-Medical): No  Physical Activity: Inactive (10/14/2021)   Exercise Vital Sign   . Days of Exercise per Week: 0 days   . Minutes of Exercise per Session: 0 min  Stress: No Stress Concern Present (10/14/2021)   [place]   . Feeling of Stress : Not at all  Social Connections: Moderately Isolated (10/14/2021)   Social Connection and Isolation Panel [NHANES]   . Frequency of Communication with Friends and Family: Twice a week   . Frequency of Social Gatherings with Friends and Family: Twice a week   . Attends Religious Services: 1 to 4 times per year   . Active Member of Clubs or Organizations: No   . Attends Archivist Meetings: Never   . Marital Status: Widowed  Intimate Partner Violence: Not At Risk (10/14/2021)   Humiliation, Afraid, Rape, and Kick questionnaire   . Fear of Current or Ex-Partner: No   . Emotionally Abused: No   . Physically Abused: No   . Sexually Abused: No    Past Surgical History:  Procedure Laterality Date  . CARPAL TUNNEL RELEASE Bilateral   . COLONOSCOPY W/ POLYPECTOMY   07/27/2007  . CYSTO WITH HYDRODISTENSION N/A 05/20/2014   Procedure: CYSTOSCOPY/HYDRODISTENSION, INSTILLATION OF CHLORPACTIN;  Surgeon: Bernestine Amass, MD;  Location: [facility];  Service: Urology;  Laterality: N/A;  . CYSTO/  HYDRODISTENTION/  INSTILLATION CLORPACTIC  09/21/2010  . DILATION AND CURETTAGE OF UTERUS  07/26/1986  . KNEE ARTHROSCOPY Right 11/18/2009  . TRIGGER FINGER RELEASE Right 08/23/2016   Procedure: RIGHT RING FINGER TRIGGER RELEASE;  Surgeon: Milly Jakob, MD;  Location: [facility];  Service: Orthopedics;  Laterality: Right;      Current Outpatient Medications:  .  amLODipine (NORVASC) 10 MG tablet, Take 1 tablet (10 mg total) by mouth daily., Disp: 90 tablet, Rfl: 3 .  atorvastatin (LIPITOR) 20 MG tablet, Take 1 tablet (20 mg total) by mouth once a week., Disp: 12 tablet, Rfl: 3 .  fluocinonide-emollient (LIDEX-E) 0.05 % cream, Apply 1 application topically 2 (two) times daily., Disp: 30 g, Rfl: 0 .  gabapentin (NEURONTIN) 100 MG capsule, Take 2 capsules (200 mg total) by mouth at bedtime., Disp: 90 capsule, Rfl: 3 .  hydrochlorothiazide (HYDRODIURIL) 25 MG tablet, Take 1 tablet (25 mg total) by mouth daily., Disp: 90 tablet, Rfl: 1 .  HYDROcodone-acetaminophen (NORCO) 7.5-325 MG tablet, Take 1 tablet by mouth every 8 (eight) hours., Disp: 90 tablet, Rfl: 0 .  losartan (COZAAR) 100 MG tablet, Take 1 tablet (100 mg total) by mouth daily., Disp: 90 tablet, Rfl: 1 .  meclizine (ANTIVERT) 25 MG tablet, Take 25 mg by mouth 3 (three) times daily as needed for dizziness., Disp: , Rfl:  .  Meth-Hyo-M Bl-Na Phos-Ph Sal (URO-MP) 118 MG CAPS, Take 1 capsule by mouth 3 (three) times daily as needed., Disp: , Rfl:  .  Vitamin D, Ergocalciferol, (DRISDOL) 1.25 MG (50000 UNIT) CAPS capsule, Take 1 capsule (50,000 Units total) by mouth every 7 (seven) days., Disp: 12 capsule, Rfl: 0    Physical Exam: There were no vitals taken for this visit.     Affect appropriate Healthy:  appears stated age 67: normal Neck supple with no adenopathy JVP normal no bruits no thyromegaly Lungs clear with no wheezing and good diaphragmatic motion Heart:  S1/S2 no murmur, no rub, gallop or click PMI normal Abdomen: benighn, BS positve, no tenderness, no AAA no bruit.  No HSM or HJR Distal pulses intact with no bruits No edema Neuro non-focal Skin warm and dry No muscular weakness   Labs:   Lab Results  Component Value Date   WBC 7.2 02/05/2022   HGB 14.0 02/05/2022   HCT 41.1 02/05/2022   MCV 86.7 02/05/2022   PLT 217.0 02/05/2022   No results  for input(s): "NA", "K", "CL", "CO2", "BUN", "CREATININE", "CALCIUM", "PROT", "BILITOT", "ALKPHOS", "ALT", "AST", "GLUCOSE" in the last 168 hours.  Invalid input(s): "LABALBU" Lab Results  Component Value Date   CKTOTAL 43 10/10/2012   CKMB 0.9 10/10/2012   TROPONINI <0.03 12/28/2015    Lab Results  Component Value Date   CHOL 207 (H) 02/05/2022   CHOL 212 (H) 03/17/2021   CHOL 229 (H) 09/22/2020   Lab Results  Component Value Date   HDL 40.50 02/05/2022   HDL 40.60 03/17/2021   HDL 40.10 09/22/2020   Lab Results  Component Value Date   LDLCALC 143 (H) 02/05/2022   LDLCALC 138 (H) 03/17/2021   LDLCALC 134 (H) 12/29/2015   Lab Results  Component Value Date   TRIG 119.0 02/05/2022   TRIG 166.0 (H) 03/17/2021   TRIG 201.0 (H) 09/22/2020   Lab Results  Component Value Date   CHOLHDL 5 02/05/2022   CHOLHDL 5 03/17/2021   CHOLHDL 6 09/22/2020   Lab Results  Component Value Date   LDLDIRECT 151.0 09/22/2020   LDLDIRECT 158.0 05/24/2018   LDLDIRECT 145.0 11/08/2016      Radiology: DG Bone Density  Result Date: 04/28/2022 EXAM: DUAL X-RAY ABSORPTIOMETRY (DXA) FOR BONE MINERAL DENSITY IMPRESSION: Referring Physician:  Grundy Your patient completed a bone mineral density test using GE Lunar iDXA system (analysis version: 16). Technologist: sec PATIENT:  Name: Cloe, Sockwell Patient ID: [id number] Birth Date: 05-04-1941 Height: 63.5 in. Sex: Female Measured: 04/28/2022 Weight: 216.0 lbs. Indications: Advanced Age, Caucasian, Estrogen Deficient, Postmenopausal Fractures: NONE Treatments: Vitamin D (E933.5) ASSESSMENT: The BMD measured at Forearm Radius 33% is 0.882 g/cm2 with a T-score of 0.1. This patient is considered normal according to [facility]) criteria. The quality of the exam is good. Site Region Measured Date Measured Age YA BMD Significant CHANGE T-score DualFemur Neck Right 04/28/2022 79.2 0.3 1.058 g/cm2 Left Forearm Radius 33% 04/28/2022 79.2 0.1 0.882 g/cm2 AP Spine L1-L4 04/28/2022 79.2 2.7 1.507 g/cm2 DualFemur Total Mean 04/28/2022 79.2 1.3 1.170 g/cm2 World Health Organization [facility]) criteria for post-menopausal, Caucasian Women: Normal       T-score at or above -1 SD Osteopenia   T-score between -1 and -2.5 SD Osteoporosis T-score at or below -2.5 SD RECOMMENDATION: 1. All patients should optimize calcium and vitamin D intake. 2. Consider FDA-approved medical therapies in postmenopausal women and men aged 107 years and older, based on the following: a. A hip or vertebral (clinical or morphometric) fracture. b. T-score = -2.5 at the femoral neck or spine after appropriate evaluation to exclude secondary causes. c. Low bone mass (T-score between -1.0 and -2.5 at the femoral neck or spine) and a 10-year probability of a hip fracture = 3% or a 10-year probability of a major osteoporosis-related fracture = 20% based on the [place]-adapted WHO algorithm. d. Clinician judgment and/or patient preferences may indicate treatment for people with 10-year fracture probabilities above or below these levels. FOLLOW-UP: Patients with diagnosis of osteoporosis or at high risk for fracture should have regular bone mineral density tests.? Patients eligible for Medicare are allowed routine testing every 2 years.? The testing frequency can be increased to  one year for patients who have rapidly progressing disease, are receiving or discontinuing medical therapy to restore bone mass, or have additional risk factors. I have reviewed this study and agree with the findings. [facility] Electronically Signed   By: Zerita Boers M.D.   On: 04/28/2022 16:01  EKG: SB rate 40 no AV block 02/04/21   ASSESSMENT AND PLAN:   Bradycardia:  related to beta blocker use and narcotic/opoid use no need for monitoring and no need for PPM  TSH normal 07/28/21  HLD  on statin labs with primary LDL 143 *** HTN:  reasonable control avoid AV nodal drugs and beta blockers  Chronic pain:  contract, UDS and f/u primary   ***  Signed: Jenkins Rouge 05/14/2022, 12:58 PM

## 2022-05-18 ENCOUNTER — Ambulatory Visit: Payer: Medicare Other | Attending: Cardiovascular Disease | Admitting: Cardiovascular Disease

## 2022-05-18 ENCOUNTER — Encounter: Payer: Self-pay | Admitting: Cardiovascular Disease

## 2022-05-18 VITALS — BP 110/64 | HR 84 | Ht 64.5 in | Wt 218.2 lb

## 2022-05-18 DIAGNOSIS — I10 Essential (primary) hypertension: Secondary | ICD-10-CM | POA: Diagnosis not present

## 2022-05-18 DIAGNOSIS — R001 Bradycardia, unspecified: Secondary | ICD-10-CM

## 2022-05-18 DIAGNOSIS — E782 Mixed hyperlipidemia: Secondary | ICD-10-CM | POA: Diagnosis not present

## 2022-05-18 NOTE — Patient Instructions (Addendum)
Medication Instructions:  Your physician recommends that you continue on your current medications as directed. Please refer to the Current Medication list given to you today.  *If you need a refill on your cardiac medications before your next appointment, please call your pharmacy*  Lab Work: If you have labs (blood work) drawn today and your tests are completely normal, you will receive your results only by: MyChart Message (if you have MyChart) OR A paper copy in the mail If you have any lab test that is abnormal or we need to change your treatment, we will call you to review the results.  Testing/Procedures: None ordered today.  Follow-Up: At [facility], you and your health needs are our priority.  As part of our continuing mission to provide you with exceptional heart care, we have created designated Provider Care Teams.  These Care Teams include your primary Cardiologist (physician) and Advanced Practice Providers (APPs -  Physician Assistants and Nurse Practitioners) who all work together to provide you with the care you need, when you need it.  We recommend signing up for the patient portal called "MyChart".  Sign up information is provided on this After Visit Summary.  MyChart is used to connect with patients for Virtual Visits (Telemedicine).  Patients are able to view lab/test results, encounter notes, upcoming appointments, etc.  Non-urgent messages can be sent to your provider as well.   To learn more about what you can do with MyChart, go to [email or web address].    Your next appointment:   As needed  The format for your next appointment:   In Person  Provider:   Peter Nishan, MD      Important Information About Sugar       

## 2022-05-24 ENCOUNTER — Ambulatory Visit (INDEPENDENT_AMBULATORY_CARE_PROVIDER_SITE_OTHER): Payer: Medicare Other | Admitting: Nurse Practitioner

## 2022-05-24 ENCOUNTER — Encounter: Payer: Self-pay | Admitting: Nurse Practitioner

## 2022-05-24 ENCOUNTER — Other Ambulatory Visit (HOSPITAL_COMMUNITY): Payer: Self-pay

## 2022-05-24 VITALS — BP 140/80 | HR 90 | Temp 96.7°F | Ht 64.5 in | Wt 218.8 lb

## 2022-05-24 DIAGNOSIS — F1120 Opioid dependence, uncomplicated: Secondary | ICD-10-CM | POA: Diagnosis not present

## 2022-05-24 DIAGNOSIS — G8929 Other chronic pain: Secondary | ICD-10-CM

## 2022-05-24 DIAGNOSIS — I10 Essential (primary) hypertension: Secondary | ICD-10-CM | POA: Diagnosis not present

## 2022-05-24 DIAGNOSIS — E559 Vitamin D deficiency, unspecified: Secondary | ICD-10-CM

## 2022-05-24 DIAGNOSIS — R001 Bradycardia, unspecified: Secondary | ICD-10-CM | POA: Diagnosis not present

## 2022-05-24 DIAGNOSIS — M4807 Spinal stenosis, lumbosacral region: Secondary | ICD-10-CM

## 2022-05-24 DIAGNOSIS — M5137 Other intervertebral disc degeneration, lumbosacral region: Secondary | ICD-10-CM

## 2022-05-24 MED ORDER — VITAMIN D (ERGOCALCIFEROL) 1.25 MG (50000 UNIT) PO CAPS
50000.0000 [IU] | ORAL_CAPSULE | ORAL | 0 refills | Status: DC
  Filled 2022-05-24: qty 12, 84d supply, fill #0

## 2022-05-24 MED ORDER — HYDROCODONE-ACETAMINOPHEN 7.5-325 MG PO TABS: 1.0000 | ORAL_TABLET | Freq: Three times a day (TID) | ORAL | 0 refills | Status: DC

## 2022-05-24 NOTE — Assessment & Plan Note (Signed)
Sent 50000Iu weekly x 12weeks, then switch to 1000IU daily OTC

## 2022-05-24 NOTE — Patient Instructions (Addendum)
Increase amlodipine to '10mg'$  BID as directed by Dr. Johnsie Cancel Continue HCTZ daily Maintain low sodium diet Get a new upper arm BP machine  Resume vit. D 50000IU weekly x 60month, then switch to 1000IU daily OTC dose

## 2022-05-24 NOTE — Assessment & Plan Note (Signed)
Had appt with cardiology for bradycardia: no additional recommendation made. He thought this was possibly due to opoid use. Advised to avoid betablocker.

## 2022-05-24 NOTE — Assessment & Plan Note (Signed)
Unable to tolerate losartan: caused myalgia. Home BP readings: 154/73, 155/81, 149/77, 133/75, 124/71, 154/82, 134/68, 131/61, 125/61. Instructed by cardiology: Dr. Johnsie Cancel to increase amlodipine to '10mg'$  BID and maintain HCTZ 12.'5mg'$  daily. Home BP machine reads significantly higher compared to manual BP check. Advised to get a new BP machine BP Readings from Last 3 Encounters:  05/24/22 (!) 140/80  05/18/22 110/64  03/18/22 132/70   Maintain med doses F/up in 84month

## 2022-05-24 NOTE — Progress Notes (Signed)
Established Patient Visit  Patient: Heather Shaw   DOB: 21-Apr-1941   81 y.o. Female  MRN: [id number] Visit Date: 05/24/2022  Subjective:    Chief Complaint  Patient presents with   Office Visit    HTN F/u  Has started going to cardiologist  Checks BP daily     HPI Bradycardia Had appt with cardiology for bradycardia: no additional recommendation made. He thought this was possibly due to opoid use. Advised to avoid betablocker.  Vitamin D deficiency Sent 50000Iu weekly x 12weeks, then switch to 1000IU daily OTC  Hypertensive disorder Unable to tolerate losartan: caused myalgia. Home BP readings: 154/73, 155/81, 149/77, 133/75, 124/71, 154/82, 134/68, 131/61, 125/61. Instructed by cardiology: Dr. Johnsie Cancel to increase amlodipine to '10mg'$  BID and maintain HCTZ 12.'5mg'$  daily. Home BP machine reads significantly higher compared to manual BP check. Advised to get a new BP machine BP Readings from Last 3 Encounters:  05/24/22 (!) 140/80  05/18/22 110/64  03/18/22 132/70   Maintain med doses F/up in 20month    Reviewed medical, surgical, and social history today  Medications: Outpatient Medications Prior to Visit  Medication Sig   amLODipine (NORVASC) 10 MG tablet Take 10 mg by mouth in the morning and at bedtime. 1 tablet in the AM and one in the PM   atorvastatin (LIPITOR) 20 MG tablet Take 1 tablet (20 mg total) by mouth once a week.   fluocinonide-emollient (LIDEX-E) 0.05 % cream Apply 1 application topically 2 (two) times daily.   gabapentin (NEURONTIN) 100 MG capsule Take 2 capsules (200 mg total) by mouth at bedtime.   hydrochlorothiazide (HYDRODIURIL) 25 MG tablet Take 1 tablet (25 mg total) by mouth daily.   meclizine (ANTIVERT) 25 MG tablet Take 25 mg by mouth 3 (three) times daily as needed for dizziness.   [DISCONTINUED] HYDROcodone-acetaminophen (NORCO) 7.5-325 MG tablet Take 1 tablet by mouth every 8 (eight) hours.   [DISCONTINUED] Vitamin D,  Ergocalciferol, (DRISDOL) 1.25 MG (50000 UNIT) CAPS capsule Take 1 capsule (50,000 Units total) by mouth every 7 (seven) days.   [DISCONTINUED] Meth-Hyo-M Bl-Na Phos-Ph Sal (URO-MP) 118 MG CAPS Take 1 capsule by mouth 3 (three) times daily as needed. (Patient not taking: Reported on 05/24/2022)   No facility-administered medications prior to visit.   Reviewed past medical and social history.   ROS per HPI above      Objective:  BP (!) 140/80 (BP Location: Right Arm, Patient Position: Sitting)   Pulse 90   Temp (!) 96.7 F (35.9 C) (Temporal)   Ht 5' 4.5" (1.638 m)   Wt 218 lb 12.8 oz (99.2 kg)   SpO2 96%   BMI 36.98 kg/m      Physical Exam Cardiovascular:     Rate and Rhythm: Normal rate and regular rhythm.     Pulses: Normal pulses.     Heart sounds: Normal heart sounds.  Pulmonary:     Effort: Pulmonary effort is normal.     Breath sounds: Normal breath sounds.  Musculoskeletal:     Right lower leg: No edema.     Left lower leg: No edema.  Neurological:     Mental Status: She is alert and oriented to person, place, and time.  Psychiatric:        Mood and Affect: Mood normal.        Behavior: Behavior normal.        Thought Content: Thought  content normal.     No results found for any visits on 05/24/22.    Assessment & Plan:    Problem List Items Addressed This Visit       Cardiovascular and Mediastinum   Hypertensive disorder - Primary    Unable to tolerate losartan: caused myalgia. Home BP readings: 154/73, 155/81, 149/77, 133/75, 124/71, 154/82, 134/68, 131/61, 125/61. Instructed by cardiology: Dr. Johnsie Cancel to increase amlodipine to '10mg'$  BID and maintain HCTZ 12.'5mg'$  daily. Home BP machine reads significantly higher compared to manual BP check. Advised to get a new BP machine BP Readings from Last 3 Encounters:  05/24/22 (!) 140/80  05/18/22 110/64  03/18/22 132/70   Maintain med doses F/up in 69month        Other   Bradycardia    Had appt with  cardiology for bradycardia: no additional recommendation made. He thought this was possibly due to opoid use. Advised to avoid betablocker.      Chronic pain   Relevant Medications   HYDROcodone-acetaminophen (NORCO) 7.5-325 MG tablet (Start on 06/11/2022)   Opioid type dependence, continuous (HCC)   Relevant Medications   HYDROcodone-acetaminophen (NORCO) 7.5-325 MG tablet (Start on 06/11/2022)   Vitamin D deficiency    Sent 50000Iu weekly x 12weeks, then switch to 1000IU daily OTC      Relevant Medications   Vitamin D, Ergocalciferol, (DRISDOL) 1.25 MG (50000 UNIT) CAPS capsule   Other Visit Diagnoses     DDD (degenerative disc disease), lumbosacral       Relevant Medications   HYDROcodone-acetaminophen (NORCO) 7.5-325 MG tablet (Start on 06/11/2022)   Spinal stenosis of lumbosacral region       Relevant Medications   HYDROcodone-acetaminophen (NORCO) 7.5-325 MG tablet (Start on 06/11/2022)      Return in about 2 months (around 07/24/2022) for HTN, hyperlipidemia (fasting).     CWilfred Lacy NP

## 2022-06-03 ENCOUNTER — Ambulatory Visit (INDEPENDENT_AMBULATORY_CARE_PROVIDER_SITE_OTHER): Payer: Medicare Other

## 2022-06-03 DIAGNOSIS — E785 Hyperlipidemia, unspecified: Secondary | ICD-10-CM

## 2022-06-03 DIAGNOSIS — I10 Essential (primary) hypertension: Secondary | ICD-10-CM

## 2022-06-03 NOTE — Progress Notes (Signed)
Chronic Care Management Pharmacy Note  06/03/2022 Name:  Heather Shaw MRN:  [id number] DOB:  [birth year]  Summary: Patient presents for CCM follow-up. Patient reports she is doing well overall. Her blood pressure is controlled, although she is worried about her diastolic blood pressure. She is in the process of purchasing a new blood pressure monitor.  Recommendations/Changes made from today's visit: -Continue current medications  Plan: CPP follow-up 3 months  Subjective: Heather Shaw is an 81 y.o. year old female who is a primary patient of [name], Charlene Brooke, NP.  The CCM team was consulted for assistance with disease management and care coordination needs.    Engaged with patient by telephone for follow up visit in response to provider referral for pharmacy case management and/or care coordination services.   Consent to Services:  The patient was given information about Chronic Care Management services, agreed to services, and gave verbal consent prior to initiation of services.  Please see initial visit note for detailed documentation.   Patient Care Team: [name], Charlene Brooke, NP as PCP - General (Internal Medicine) Josue Hector, MD as PCP - Cardiology (Cardiology) Rana Snare, MD (Inactive) as Consulting Physician (Urology) Maisie Fus, MD (Inactive) as Consulting Physician (Obstetrics and Gynecology) Juluis Rainier as Consulting Physician (Optometry) Lavonna Monarch, MD (Inactive) as Consulting Physician (Dermatology) Germaine Pomfret, [facility] as Pharmacist (Pharmacist)  Recent office visits: 05/24/22: Patient presented to Wilfred Lacy, NP for follow-up.  03/18/22: Patient presented to Wilfred Lacy, NP for follow-up.   Recent consult visits: 05/18/22: Patient presented to Dr. Johnsie Cancel (Cardiology) for follow-up.   Hospital visits: Medication Reconciliation was completed by comparing discharge summary, patient's EMR and Pharmacy list, and upon discussion  with patient.   Admitted to the hospital on 04/01/2021 due to Interstitial Cystitis without hematuria. Discharge date was 04/02/2021. Discharged from [place]?Medications Started at [facility] Discharge:?? -started None   Medication Changes at Hospital Discharge: -Changed None   Medications Discontinued at Hospital Discharge: -Stopped None   Medications that remain the same after Hospital Discharge:??  -All other medications will remain the same.     Objective:  Lab Results  Component Value Date   CREATININE 0.85 02/05/2022   BUN 20 02/05/2022   GFR 65.34 02/05/2022   GFRNONAA >60 04/01/2021   GFRAA >60 01/25/2020   NA 140 02/05/2022   K 3.7 02/05/2022   CALCIUM 9.6 02/05/2022   CO2 27 02/05/2022   GLUCOSE 104 (H) 02/05/2022    Lab Results  Component Value Date/Time   HGBA1C 5.7 02/05/2022 08:10 AM   HGBA1C 6.0 03/17/2021 11:49 AM   GFR 65.34 02/05/2022 08:10 AM   GFR 82.79 07/28/2021 02:15 PM    Last diabetic Eye exam: No results found for: "HMDIABEYEEXA"  Last diabetic Foot exam: No results found for: "HMDIABFOOTEX"   Lab Results  Component Value Date   CHOL 207 (H) 02/05/2022   HDL 40.50 02/05/2022   LDLCALC 143 (H) 02/05/2022   LDLDIRECT 151.0 09/22/2020   TRIG 119.0 02/05/2022   CHOLHDL 5 02/05/2022       Latest Ref Rng & Units 02/05/2022    8:10 AM 04/01/2021    8:13 PM 07/13/2019    1:22 PM  Hepatic Function  Total Protein 6.0 - 8.3 g/dL 7.2  7.3  6.9   Albumin 3.5 - 5.2 g/dL 4.4  4.0  4.2   AST 0 - 37 U/L 16  28  15  ALT 0 - 35 U/L _0 Alk Phosphatase 39 - 117 U/L 50  42  57   Total Bilirubin 0.2 - 1.2 mg/dL 0.4  0.7  0.4     Lab Results  Component Value Date/Time   TSH 2.17 07/28/2021 02:15 PM   TSH 1.41 05/24/2018 10:12 AM       Latest Ref Rng & Units 02/05/2022    8:10 AM 04/01/2021    8:13 PM 01/25/2020    5:23 PM  CBC  WBC 4.0 - 10.5 K/uL 7.2  4.6  8.0   Hemoglobin 12.0 - 15.0 g/dL 14.0  15.4   14.4   Hematocrit 36.0 - 46.0 % 41.1  44.3  43.7   Platelets 150.0 - 400.0 K/uL 217.0  174  230     Lab Results  Component Value Date/Time   VD25OH 7.22 (L) 07/28/2021 02:15 PM   VD25OH 41 10/03/2009 08:21 PM    Clinical ASCVD: No  The 10-year ASCVD risk score (Arnett DK, et al., 2019) is: 55.8%   Values used to calculate the score:     Age: 81 years     Sex: Female     Is Non-Hispanic African American: No     Diabetic: Yes     Tobacco smoker: No     Systolic Blood Pressure: 741 mmHg     Is BP treated: Yes     HDL Cholesterol: 40.5 mg/dL     Total Cholesterol: 207 mg/dL       11/04/2021    1:13 PM 10/14/2021    2:28 PM 10/14/2021    2:27 PM  Depression screen PHQ 2/9  Decreased Interest 0 0 0  Down, Depressed, Hopeless 0 0 0  PHQ - 2 Score 0 0 0    Social History   Tobacco Use  Smoking Status Former   Types: Cigarettes   Quit date: 05/17/1971   Years since quitting: 51.0  Smokeless Tobacco Never   BP Readings from Last 3 Encounters:  05/24/22 (!) 140/80  05/18/22 110/64  03/18/22 132/70   Pulse Readings from Last 3 Encounters:  05/24/22 90  05/18/22 84  03/18/22 (!) 105   Wt Readings from Last 3 Encounters:  05/24/22 218 lb 12.8 oz (99.2 kg)  05/18/22 218 lb 3.2 oz (99 kg)  03/18/22 215 lb 12.8 oz (97.9 kg)   BMI Readings from Last 3 Encounters:  05/24/22 36.98 kg/m  05/18/22 36.88 kg/m  03/18/22 35.91 kg/m    Assessment/Interventions: Review of patient past medical history, allergies, medications, health status, including review of consultants reports, laboratory and other test data, was performed as part of comprehensive evaluation and provision of chronic care management services.   SDOH:  (Social Determinants of Health) assessments and interventions performed: Yes SDOH Interventions    Flowsheet Row Chronic Care Management from 11/04/2021 in [place] from 10/14/2021 in [place] Management from 09/17/2021 in [place] Interventions Intervention Not Indicated Intervention Not Indicated --  Housing Interventions Intervention Not Indicated Intervention Not Indicated --  Transportation Interventions -- Intervention Not Indicated --  Financial Strain Interventions -- Intervention Not Indicated Intervention Not Indicated  Physical Activity Interventions -- Intervention Not Indicated --  Stress Interventions -- Intervention Not Indicated --  Social Connections Interventions -- Intervention Not Indicated --       [place]  Insecurity: No Food Insecurity (11/04/2021)  Housing: Low Risk  (11/04/2021)  Transportation Needs: No Transportation Needs (11/04/2021)  Alcohol Screen: Low Risk  (10/14/2021)  Depression (PHQ2-9): Low Risk  (11/04/2021)  Financial Resource Strain: Low Risk  (10/14/2021)  Physical Activity: Inactive (10/14/2021)  Social Connections: Moderately Isolated (10/14/2021)  Stress: No Stress Concern Present (10/14/2021)  Tobacco Use: Medium Risk (05/24/2022)    CCM Care Plan  Allergies  Allergen Reactions   Contrast Media [Iodinated Contrast Media]     ? angioedema   Lisinopril     ? angioedema   Allegra [Fexofenadine] Other (See Comments)    Makes pt nervous   Codeine Nausea And Vomiting   Losartan Other (See Comments)    myalgia   Nsaids     "stomach on fire feeling"    Prednisone     Causes elevation in blood pressure- ? All steroids cause same reaction   Pseudoephedrine Hcl Nausea And Vomiting   Zithromax [Azithromycin] Other (See Comments)    Burns stomach   Sulfa Antibiotics Rash    Medications Reviewed Today     Reviewed by Flossie Buffy, NP (Nurse Practitioner) on 05/24/22 at 1223  Med List Status: <None>   Medication Order Taking? Sig Documenting Provider Last Dose Status Informant  amLODipine (NORVASC) 10 MG tablet [id number] Yes Take 10  mg by mouth in the morning and at bedtime. 1 tablet in the AM and one in the PM [provider] Taking Active   atorvastatin (LIPITOR) 20 MG tablet [id number] Yes Take 1 tablet (20 mg total) by mouth once a week. [name], Charlene Brooke, NP Taking Active   fluocinonide-emollient (LIDEX-E) 0.05 % cream [id number] Yes Apply 1 application topically 2 (two) times daily. Flossie Buffy, NP Taking Active            Med Note (COX, CANDICE A   Tue May 18, 2022  3:54 PM)    gabapentin (NEURONTIN) 100 MG capsule [id number] Yes Take 2 capsules (200 mg total) by mouth at bedtime. [name], Charlene Brooke, NP Taking Active   hydrochlorothiazide (HYDRODIURIL) 25 MG tablet [id number] Yes Take 1 tablet (25 mg total) by mouth daily. Flossie Buffy, NP Taking Active   HYDROcodone-acetaminophen (NORCO) 7.5-325 MG tablet [id number]  Take 1 tablet by mouth every 8 (eight) hours. [name], Charlene Brooke, NP  Active   meclizine (ANTIVERT) 25 MG tablet [id number] Yes Take 25 mg by mouth 3 (three) times daily as needed for dizziness. [provider] Taking Active   Vitamin D, Ergocalciferol, (DRISDOL) 1.25 MG (50000 UNIT) CAPS capsule [id number]  Take 1 capsule (50,000 Units total) by mouth every 7 (seven) days. Flossie Buffy, NP  Active             Patient Active Problem List   Diagnosis Date Noted   Angio-edema 03/27/2021   Hyperglycemia 09/25/2020   Hardening of the aorta (main artery of the heart) ([place]) 09/25/2020   Opioid type dependence, continuous ([place]) 08/15/2019   Chronic pain 08/11/2017   Hearing loss 11/23/2016   SVT (supraventricular tachycardia)    Bradycardia 12/27/2015   Carpal tunnel syndrome 09/19/2014   Eczema 09/19/2014   Chronic interstitial cystitis 05/20/2014   Lumbar radiculopathy, chronic 12/31/2013   Degeneration of lumbosacral intervertebral disc 04/07/2011   Vitamin D deficiency 10/03/2009   HLD (hyperlipidemia) 07/16/2009   Hypertensive disorder 07/16/2009     Immunization History  Administered Date(s) Administered   Influenza Split 05/29/2012   Influenza, High Dose Seasonal PF 05/24/2018,  05/29/2019   Influenza,inj,Quad PF,6+ Mos 06/23/2016, 05/27/2017   Influenza-Unspecified 06/05/2020, 06/05/2021   PFIZER(Purple Top)SARS-COV-2 Vaccination 10/28/2019, 12/08/2019   Pneumococcal Conjugate-13 11/03/2015   Pneumococcal Polysaccharide-23 07/14/2006, 05/29/2012   Td 07/11/2008   Td (Adult), 2 Lf Tetanus Toxid, Preservative Free 07/11/2008   Tdap 04/23/2014   Zoster, Live 10/27/2010    Conditions to be addressed/monitored:  Hypertension, Hyperlipidemia, and Chronic Pain  Care Plan : General Pharmacy (Adult)  Updates made by Germaine Pomfret, RPH since 06/03/2022 12:00 AM     Problem: Hypertension, Hyperlipidemia, and Chronic Pain   Priority: High     Long-Range Goal: Patient-Specific Goal   Start Date: 09/25/2021  Expected End Date: 09/26/2022  This Visit's Progress: On track  Recent Progress: On track  Priority: High  Note:   Current Barriers:  Unable to achieve control of cholesterol   Pharmacist Clinical Goal(s):  Patient will achieve control of cholesterol as evidenced by LDL less than 70 through collaboration with PharmD and provider.   Interventions: 1:1 collaboration with [name], Charlene Brooke, NP regarding development and update of comprehensive plan of care as evidenced by provider attestation and co-signature Inter-disciplinary care team collaboration (see longitudinal plan of care) Comprehensive medication review performed; medication list updated in electronic medical record  Hypertension (BP goal <140/90) -Not ideally controlled  -Current treatment: Amlodipine 10 mg twice daily: Appropriate, Effective, Safe, Accessible HCTZ 25 mg daily as needed  Takes about 3-4 times weekly  -Medications previously tried: Clonidine, Furosemide, lisinopril (Angioedema) , nebivolol  -Current home readings: 130-140s/60s  -Current  dietary habits: Mainly drinks water. Limits salt. Purchases frozen vegetables.  -Current exercise habits: Tries to stay active and walk, but is limited by back pain.  -Denies hypotensive/hypertensive symptoms -Continue current medications   Hyperlipidemia: (LDL goal < 100) -Uncontrolled -Current treatment: Atorvastatin 20 mg once weekly  -Medications previously tried: Rosuvastatin -Continue current medications  Chronic pain (Goal: Minimize pain) -Controlled -Current treatment  Gabapentin 100 mg 2 caps nghtly: Appropriate, Effective, Safe, Accessible  Sometimes will take 2 capsules twice daily if pain is severe Hydrocodone-APAP 7.5-325 mg every 8 hours as needed: Appropriate, Effective, Query Safe -Medications previously tried: NA  -Recommended to continue current medication  Patient Goals/Self-Care Activities Patient will:  - check blood pressure weekly, document, and provide at future appointments  Follow Up Plan: Telephone follow up appointment with care management team member scheduled for:  09/02/2022 at 11:00 AM    Medication Assistance: None required.  Patient affirms current coverage meets needs.  Compliance/Adherence/Medication fill history: Care Gaps: Shingrix Vaccine COVID-19 Vaccine HTN: 140/82 on 07/28/2021  Star-Rating Drugs: Rosuvastatin 10 mg last filled 08/07/2021 84 day supply at at [facility].  Patient's preferred pharmacy is:  [facility] [address] [place] [address] Phone: [phone] Fax: [phone]  Uses pill box? Yes Pt endorses 100% compliance  We discussed: Current pharmacy is preferred with insurance plan and patient is satisfied with pharmacy services Patient decided to: Continue current medication management strategy  Care Plan and Follow Up Patient Decision:  Patient agrees to Care Plan and Follow-up.  Plan: Telephone follow up appointment with care management team  member scheduled for:  09/02/2022 at 11:00 AM  Junius Argyle, PharmD, Para [place], CPP Clinical Pharmacist Practitioner  [facility] at [facility]  [phone]

## 2022-06-03 NOTE — Patient Instructions (Signed)
Visit Information It was great speaking with you today!  Please let me know if you have any questions about our visit.   Goals Addressed             This Visit's Progress    Track and Manage My Blood Pressure-Hypertension   On track    Timeframe:  Long-Range Goal Priority:  High Start Date:  09/17/21                           Expected End Date: 09/17/2022                      Follow Up within 90 days   - check blood pressure weekly    Why is this important?   You won't feel high blood pressure, but it can still hurt your blood vessels.  High blood pressure can cause heart or kidney problems. It can also cause a stroke.  Making lifestyle changes like losing a little weight or eating less salt will help.  Checking your blood pressure at home and at different times of the day can help to control blood pressure.  If the doctor prescribes medicine remember to take it the way the doctor ordered.  Call the office if you cannot afford the medicine or if there are questions about it.     Notes:         Patient Care Plan: General Pharmacy (Adult)     Problem Identified: Hypertension, Hyperlipidemia, and Chronic Pain   Priority: High     Long-Range Goal: Patient-Specific Goal   Start Date: 09/25/2021  Expected End Date: 09/26/2022  This Visit's Progress: On track  Recent Progress: On track  Priority: High  Note:   Current Barriers:  Unable to achieve control of cholesterol   Pharmacist Clinical Goal(s):  Patient will achieve control of cholesterol as evidenced by LDL less than 70 through collaboration with PharmD and provider.   Interventions: 1:1 collaboration with [name], Charlene Brooke, NP regarding development and update of comprehensive plan of care as evidenced by provider attestation and co-signature Inter-disciplinary care team collaboration (see longitudinal plan of care) Comprehensive medication review performed; medication list updated in electronic medical  record  Hypertension (BP goal <140/90) -Not ideally controlled  -Current treatment: Amlodipine 10 mg twice daily: Appropriate, Effective, Safe, Accessible HCTZ 25 mg daily as needed  Takes about 3-4 times weekly  -Medications previously tried: Clonidine, Furosemide, lisinopril (Angioedema) , nebivolol  -Current home readings: 130-140s/60s  -Current dietary habits: Mainly drinks water. Limits salt. Purchases frozen vegetables.  -Current exercise habits: Tries to stay active and walk, but is limited by back pain.  -Denies hypotensive/hypertensive symptoms -Continue current medications   Hyperlipidemia: (LDL goal < 100) -Uncontrolled -Current treatment: Atorvastatin 20 mg once weekly  -Medications previously tried: Rosuvastatin -Continue current medications  Chronic pain (Goal: Minimize pain) -Controlled -Current treatment  Gabapentin 100 mg 2 caps nghtly: Appropriate, Effective, Safe, Accessible  Sometimes will take 2 capsules twice daily if pain is severe Hydrocodone-APAP 7.5-325 mg every 8 hours as needed: Appropriate, Effective, Query Safe -Medications previously tried: NA  -Recommended to continue current medication  Patient Goals/Self-Care Activities Patient will:  - check blood pressure weekly, document, and provide at future appointments  Follow Up Plan: Telephone follow up appointment with care management team member scheduled for:  09/02/2022 at 11:00 AM    Patient agreed to services and verbal consent obtained.   Patient verbalizes  understanding of instructions and care plan provided today and agrees to view in [place]. Active MyChart status and patient understanding of how to access instructions and care plan via MyChart confirmed with patient.     Junius Argyle, PharmD, Para March, CPP Clinical Pharmacist Practitioner  [facility] at [facility]  [phone]

## 2022-06-10 ENCOUNTER — Other Ambulatory Visit (HOSPITAL_COMMUNITY): Payer: Self-pay

## 2022-06-11 ENCOUNTER — Telehealth: Payer: Self-pay | Admitting: Nurse Practitioner

## 2022-06-11 ENCOUNTER — Other Ambulatory Visit (HOSPITAL_COMMUNITY): Payer: Self-pay

## 2022-06-11 DIAGNOSIS — M5137 Other intervertebral disc degeneration, lumbosacral region: Secondary | ICD-10-CM

## 2022-06-11 DIAGNOSIS — F1120 Opioid dependence, uncomplicated: Secondary | ICD-10-CM

## 2022-06-11 DIAGNOSIS — M4807 Spinal stenosis, lumbosacral region: Secondary | ICD-10-CM

## 2022-06-11 DIAGNOSIS — G8929 Other chronic pain: Secondary | ICD-10-CM

## 2022-06-11 MED ORDER — HYDROCODONE-ACETAMINOPHEN 7.5-325 MG PO TABS
1.0000 | ORAL_TABLET | Freq: Three times a day (TID) | ORAL | 0 refills | Status: DC
  Filled 2022-06-11: qty 90, 30d supply, fill #0

## 2022-06-11 NOTE — Telephone Encounter (Signed)
Caller Name: Verdene Creson Call back phone #: [phone]  Reason for Call: Pt spoke with [facility] pharmacy. They advised her that they needed a call from [name] before they could fill this medication. Prescription looks to have been approved/sent in today. Please call pt with an update. She is concerned about going without Hydrocodone-acetaminophen this weekend

## 2022-06-22 ENCOUNTER — Other Ambulatory Visit: Payer: Self-pay | Admitting: Nurse Practitioner

## 2022-06-22 ENCOUNTER — Ambulatory Visit: Payer: Medicare Other | Admitting: Nurse Practitioner

## 2022-06-22 DIAGNOSIS — I10 Essential (primary) hypertension: Secondary | ICD-10-CM

## 2022-06-22 DIAGNOSIS — F1120 Opioid dependence, uncomplicated: Secondary | ICD-10-CM

## 2022-06-22 DIAGNOSIS — E782 Mixed hyperlipidemia: Secondary | ICD-10-CM

## 2022-06-24 DIAGNOSIS — E785 Hyperlipidemia, unspecified: Secondary | ICD-10-CM

## 2022-06-24 DIAGNOSIS — I10 Essential (primary) hypertension: Secondary | ICD-10-CM

## 2022-07-06 ENCOUNTER — Telehealth: Payer: Self-pay

## 2022-07-06 NOTE — Progress Notes (Signed)
  Chronic Care Management   Note  07/06/2022 Name: Heather Shaw MRN: [id number] DOB: Aug 24, 1940  Heather Shaw is a 81 y.o. year old female who is a primary care patient of [name], Charlene Brooke, NP. I reached out to [facility] by phone today in response to a referral sent by [name]'s PCP.  [name]  agreedto scheduling an appointment with the CCM RN Case Manager   Follow up plan: Patient agreed to scheduled appointment with RN Case Manager on 08/03/2022(date/time).   Noreene Larsson, [place], [place] [address] Direct Dial: [phone] [email or web address]

## 2022-07-08 ENCOUNTER — Encounter: Payer: Self-pay | Admitting: Nurse Practitioner

## 2022-07-08 ENCOUNTER — Other Ambulatory Visit (HOSPITAL_COMMUNITY): Payer: Self-pay

## 2022-07-08 ENCOUNTER — Ambulatory Visit (INDEPENDENT_AMBULATORY_CARE_PROVIDER_SITE_OTHER): Payer: Medicare Other | Admitting: Nurse Practitioner

## 2022-07-08 VITALS — BP 140/80 | HR 80 | Temp 97.2°F | Ht 64.5 in | Wt 211.2 lb

## 2022-07-08 DIAGNOSIS — G8929 Other chronic pain: Secondary | ICD-10-CM | POA: Diagnosis not present

## 2022-07-08 DIAGNOSIS — J069 Acute upper respiratory infection, unspecified: Secondary | ICD-10-CM

## 2022-07-08 DIAGNOSIS — M4807 Spinal stenosis, lumbosacral region: Secondary | ICD-10-CM

## 2022-07-08 DIAGNOSIS — M5137 Other intervertebral disc degeneration, lumbosacral region: Secondary | ICD-10-CM

## 2022-07-08 DIAGNOSIS — E785 Hyperlipidemia, unspecified: Secondary | ICD-10-CM

## 2022-07-08 DIAGNOSIS — I10 Essential (primary) hypertension: Secondary | ICD-10-CM | POA: Diagnosis not present

## 2022-07-08 DIAGNOSIS — F1120 Opioid dependence, uncomplicated: Secondary | ICD-10-CM

## 2022-07-08 DIAGNOSIS — R739 Hyperglycemia, unspecified: Secondary | ICD-10-CM

## 2022-07-08 LAB — POCT INFLUENZA A/B
Influenza A, POC: NEGATIVE
Influenza B, POC: NEGATIVE

## 2022-07-08 LAB — POC COVID19 BINAXNOW: SARS Coronavirus 2 Ag: NEGATIVE

## 2022-07-08 MED ORDER — AMLODIPINE BESYLATE 10 MG PO TABS
10.0000 mg | ORAL_TABLET | Freq: Every day | ORAL | 3 refills | Status: DC
  Filled 2022-07-08: qty 90, 90d supply, fill #0
  Filled 2022-09-28: qty 90, 90d supply, fill #1
  Filled 2022-12-23: qty 90, 90d supply, fill #2
  Filled 2023-03-31: qty 90, 90d supply, fill #3

## 2022-07-08 MED ORDER — HYDROCODONE-ACETAMINOPHEN 7.5-325 MG PO TABS
1.0000 | ORAL_TABLET | Freq: Three times a day (TID) | ORAL | 0 refills | Status: DC
  Filled 2022-07-12: qty 90, 30d supply, fill #0

## 2022-07-08 NOTE — Assessment & Plan Note (Signed)
Elevated BP today due to lack of AM med dose and acute URI symptoms. BP Readings from Last 3 Encounters:  07/08/22 (!) 140/80  05/24/22 (!) 140/80  05/18/22 110/64    Maintain med doses Repeat BMP F/up in month

## 2022-07-08 NOTE — Patient Instructions (Addendum)
Negative Flu and COVID test Ok to use coricidin HBP for cough and sinus congestion. Flonase and Afrin use: apply 1spray of afrin in each nare, wait 23mns, then apply 2sprays of flonase in each nare. Use both nasal spray consecutively x 3days, then flonase only for at least 14days. Then stop Encourage adequate oral hydration. Call office if no improvement in 1week

## 2022-07-08 NOTE — Progress Notes (Signed)
Established Patient Visit  Patient: Heather Shaw   DOB: 1941-04-17   81 y.o. Female  MRN: [id number] Visit Date: 07/08/2022  Subjective:    Chief Complaint  Patient presents with   Office Visit    HTN/ Hyperlipidemia Hasn't checked BP, pt fasting C/o Sore throat, headache, nasal congestion, body aches, low grade fever x 6 days Nx COVID 4 days ago   URI  This is a new problem. The current episode started in the past 7 days. The problem has been unchanged. Associated symptoms include congestion, coughing, headaches, joint pain, rhinorrhea, sinus pain and a sore throat. Pertinent negatives include no abdominal pain, chest pain, diarrhea, dysuria, ear pain, joint swelling, nausea, neck pain, plugged ear sensation, rash, sneezing, swollen glands, vomiting or wheezing. She has tried nothing for the symptoms.   Hypertensive disorder Elevated BP today due to lack of AM med dose and acute URI symptoms. BP Readings from Last 3 Encounters:  07/08/22 (!) 140/80  05/24/22 (!) 140/80  05/18/22 110/64    Maintain med doses Repeat BMP F/up in month  Chronic pain Controlled pain with hydrocodone and gabapentin. Denies any adverse effects. PMPM database review: no red flags, last filled 06/11/2022.  Med refill sent for pick up on 12/17 ot 07/12/22 F/up 11month  Reviewed medical, surgical, and social history today  Medications: Outpatient Medications Prior to Visit  Medication Sig   atorvastatin (LIPITOR) 20 MG tablet Take 1 tablet (20 mg total) by mouth once a week.   fluocinonide-emollient (LIDEX-E) 0.05 % cream Apply 1 application topically 2 (two) times daily.   gabapentin (NEURONTIN) 100 MG capsule Take 2 capsules (200 mg total) by mouth at bedtime.   hydrochlorothiazide (HYDRODIURIL) 25 MG tablet Take 1 tablet (25 mg total) by mouth daily.   meclizine (ANTIVERT) 25 MG tablet Take 25 mg by mouth 3 (three) times daily as needed for dizziness.   Vitamin D,  Ergocalciferol, (DRISDOL) 1.25 MG (50000 UNIT) CAPS capsule Take 1 capsule (50,000 Units total) by mouth every 7 (seven) days.   [DISCONTINUED] amLODipine (NORVASC) 10 MG tablet Take 10 mg by mouth in the morning and at bedtime. 1 tablet in the AM and one in the PM   [DISCONTINUED] HYDROcodone-acetaminophen (NORCO) 7.5-325 MG tablet Take 1 tablet by mouth every 8 (eight) hours.   No facility-administered medications prior to visit.   Reviewed past medical and social history.   ROS per HPI above      Objective:  BP (!) 140/80   Pulse 80   Temp (!) 97.2 F (36.2 C) (Temporal)   Ht 5' 4.5" (1.638 m)   Wt 211 lb 3.2 oz (95.8 kg)   SpO2 96%   BMI 35.69 kg/m      Physical Exam Constitutional:      General: She is not in acute distress. Cardiovascular:     Rate and Rhythm: Normal rate and regular rhythm.     Pulses: Normal pulses.     Heart sounds: Normal heart sounds.  Pulmonary:     Effort: Pulmonary effort is normal.     Breath sounds: Normal breath sounds.  Musculoskeletal:     Right lower leg: No edema.     Left lower leg: No edema.  Lymphadenopathy:     Cervical: No cervical adenopathy.  Neurological:     Mental Status: She is alert and oriented to person, place, and time.  Psychiatric:  Mood and Affect: Mood normal.        Behavior: Behavior normal.        Thought Content: Thought content normal.     Results for orders placed or performed in visit on 07/08/22  POC COVID-19  Result Value Ref Range   SARS Coronavirus 2 Ag Negative Negative  POCT Influenza A/B  Result Value Ref Range   Influenza A, POC Negative Negative   Influenza B, POC Negative Negative      Assessment & Plan:    Problem List Items Addressed This Visit       Cardiovascular and Mediastinum   Hypertensive disorder    Elevated BP today due to lack of AM med dose and acute URI symptoms. BP Readings from Last 3 Encounters:  07/08/22 (!) 140/80  05/24/22 (!) 140/80  05/18/22 110/64     Maintain med doses Repeat BMP F/up in month      Relevant Medications   amLODipine (NORVASC) 10 MG tablet   Other Relevant Orders   Renal Function Panel     Other   Chronic pain    Controlled pain with hydrocodone and gabapentin. Denies any adverse effects. PMPM database review: no red flags, last filled 06/11/2022.  Med refill sent for pick up on 12/17 ot 07/12/22 F/up 31month      Relevant Medications   HYDROcodone-acetaminophen (NORCO) 7.5-325 MG tablet (Start on 07/12/2022)   HLD (hyperlipidemia)   Relevant Medications   amLODipine (NORVASC) 10 MG tablet   Other Relevant Orders   Lipid panel   Hyperglycemia   Relevant Orders   Hemoglobin A1c   Opioid type dependence, continuous ([facility]   Relevant Medications   HYDROcodone-acetaminophen (NORCO) 7.5-325 MG tablet (Start on 07/12/2022)   Other Visit Diagnoses     Viral upper respiratory tract infection    -  Primary   Relevant Orders   POC COVID-19 (Completed)   POCT Influenza A/B (Completed)   DDD (degenerative disc disease), lumbosacral       Relevant Medications   HYDROcodone-acetaminophen (NORCO) 7.5-325 MG tablet (Start on 07/12/2022)   Spinal stenosis of lumbosacral region       Relevant Medications   HYDROcodone-acetaminophen (NORCO) 7.5-325 MG tablet (Start on 07/12/2022)     Negative Flu and COVID test Ok to use coricidin HBP for cough and sinus congestion. Flonase and Afrin use: apply 1spray of afrin in each nare, wait 543ms, then apply 2sprays of flonase in each nare. Use both nasal spray consecutively x 3days, then flonase only for at least 14days. Then stop Encourage adequate oral hydration. Call office if no improvement in 1week  Return in about 3 months (around 10/07/2022) for HTN, chronic pain management, hyperlipidemia (fasting)-repeat UDS.     ChWilfred LacyNP

## 2022-07-08 NOTE — Assessment & Plan Note (Signed)
Controlled pain with hydrocodone and gabapentin. Denies any adverse effects. PMPM database review: no red flags, last filled 06/11/2022.  Med refill sent for pick up on 12/17 ot 07/12/22 F/up 30month

## 2022-07-12 ENCOUNTER — Other Ambulatory Visit: Payer: Self-pay

## 2022-07-12 ENCOUNTER — Other Ambulatory Visit (HOSPITAL_COMMUNITY): Payer: Self-pay

## 2022-07-21 ENCOUNTER — Other Ambulatory Visit (HOSPITAL_COMMUNITY): Payer: Self-pay

## 2022-07-30 ENCOUNTER — Other Ambulatory Visit (INDEPENDENT_AMBULATORY_CARE_PROVIDER_SITE_OTHER): Payer: BLUE CROSS/BLUE SHIELD

## 2022-07-30 DIAGNOSIS — I10 Essential (primary) hypertension: Secondary | ICD-10-CM | POA: Diagnosis not present

## 2022-07-30 DIAGNOSIS — R739 Hyperglycemia, unspecified: Secondary | ICD-10-CM | POA: Diagnosis not present

## 2022-07-30 DIAGNOSIS — E782 Mixed hyperlipidemia: Secondary | ICD-10-CM

## 2022-07-30 DIAGNOSIS — E785 Hyperlipidemia, unspecified: Secondary | ICD-10-CM | POA: Diagnosis not present

## 2022-07-30 LAB — LIPID PANEL
Cholesterol: 211 mg/dL — ABNORMAL HIGH (ref 0–200)
HDL: 35.9 mg/dL — ABNORMAL LOW (ref >39.00)
NonHDL: 175.05
Total CHOL/HDL Ratio: 6
Triglycerides: 293 mg/dL — ABNORMAL HIGH (ref 0.0–149.0)
VLDL: 58.6 mg/dL — ABNORMAL HIGH (ref 0.0–40.0)

## 2022-07-30 LAB — HEMOGLOBIN A1C: Hgb A1c MFr Bld: 5.8 % (ref 4.6–6.5)

## 2022-07-30 LAB — RENAL FUNCTION PANEL
Albumin: 4.2 g/dL (ref 3.5–5.2)
BUN: 14 mg/dL (ref 6–23)
CO2: 31 mEq/L (ref 19–32)
Calcium: 10.3 mg/dL (ref 8.4–10.5)
Chloride: 99 mEq/L (ref 96–112)
Creatinine, Ser: 0.78 mg/dL (ref 0.40–1.20)
GFR: 72.2 mL/min (ref >60.00)
Glucose, Bld: 111 mg/dL — ABNORMAL HIGH (ref 70–99)
Phosphorus: 4.4 mg/dL (ref 2.3–4.6)
Potassium: 3.9 mEq/L (ref 3.5–5.1)
Sodium: 139 mEq/L (ref 135–145)

## 2022-07-30 LAB — LDL CHOLESTEROL, DIRECT: Direct LDL: 128 mg/dL

## 2022-08-02 ENCOUNTER — Other Ambulatory Visit (HOSPITAL_COMMUNITY): Payer: Self-pay

## 2022-08-02 MED ORDER — FENOFIBRATE 145 MG PO TABS
145.0000 mg | ORAL_TABLET | Freq: Every day | ORAL | 1 refills | Status: DC
  Filled 2022-08-02: qty 90, 90d supply, fill #0

## 2022-08-02 MED ORDER — ATORVASTATIN CALCIUM 20 MG PO TABS
20.0000 mg | ORAL_TABLET | ORAL | 3 refills | Status: DC
  Filled 2022-08-02: qty 24, 84d supply, fill #0

## 2022-08-02 NOTE — Addendum Note (Signed)
Addended by: Wilfred Lacy L on: 08/02/2022 01:08 PM   Modules accepted: Orders

## 2022-08-02 NOTE — Assessment & Plan Note (Addendum)
Repeat lipid panel: LDL and triglyceride not at goal Increase atorvastatin to '20mg'$  2x/week, add fenofibrate '145mg'$  daily, entered referral to lipid clinic. Repeat lipid panel in 31month

## 2022-08-03 ENCOUNTER — Ambulatory Visit (INDEPENDENT_AMBULATORY_CARE_PROVIDER_SITE_OTHER): Payer: Medicare Other

## 2022-08-03 ENCOUNTER — Telehealth: Payer: Medicare Other

## 2022-08-03 DIAGNOSIS — F1120 Opioid dependence, uncomplicated: Secondary | ICD-10-CM

## 2022-08-03 DIAGNOSIS — E785 Hyperlipidemia, unspecified: Secondary | ICD-10-CM

## 2022-08-03 DIAGNOSIS — G8929 Other chronic pain: Secondary | ICD-10-CM

## 2022-08-03 DIAGNOSIS — I10 Essential (primary) hypertension: Secondary | ICD-10-CM

## 2022-08-03 NOTE — Patient Instructions (Addendum)
Please call the care guide team at [phone] if you need to cancel or reschedule your appointment.   If you are experiencing a Mental Health or [place] or need someone to talk to, please call the Suicide and Crisis Lifeline: 988 call the [place] National Suicide Prevention Lifeline: 917-073-5058 or TTY: (575)815-5222 TTY (507)630-7497) to talk to a trained counselor call 1-800-273-TALK (toll free, 24 hour hotline)   Following is a copy of the CCM Program Consent:  CCM service includes personalized support from designated clinical staff supervised by the physician, including individualized plan of care and coordination with other care providers 24/7 contact phone numbers for assistance for urgent and routine care needs. Service will only be billed when office clinical staff spend 20 minutes or more in a month to coordinate care. Only one practitioner may furnish and bill the service in a calendar month. The patient may stop CCM services at amy time (effective at the end of the month) by phone call to the office staff. The patient will be responsible for cost sharing (co-pay) or up to 20% of the service fee (after annual deductible is met)  Following is a copy of your full provider care plan:   Goals Addressed             This Visit's Progress    CCM Expected Outcome:  Monitor, Self-Manage and Reduce Symptoms of: Chronic Pain       Current Barriers:  Chronic Disease Management support and education needs related to for effective management of Chronic Pain with long term Opioid use  Planned Interventions: Reviewed provider established plan for pain management. The patient has chronic pain, specifically with back pain and arthritis. The patient has long term use of Opioids and sees provider on a regular basis. The patient rates her pain today at a 6. States that it is hardly ever under a 6; Discussed importance of adherence to all scheduled medical appointments; Counseled  on the importance of reporting any/all new or changed pain symptoms or management strategies to pain management provider; Advised patient to report to care team affect of pain on daily activities. The patient states that if she did not have the pain medications she could not function. She is thankful that she has good support system with her providers. ; Discussed use of relaxation techniques and/or diversional activities to assist with pain reduction (distraction, imagery, relaxation, massage, acupressure, TENS, heat, and cold application. The patient uses heat application and cold application also. The patient states she has a system that works for her well. Education and support given; Reviewed with patient prescribed pharmacological and nonpharmacological pain relief strategies. Review and education provided; Advised patient to discuss changes in her level of pain or intensity of pain, or unresolved pain  with provider; Screening for signs and symptoms of depression related to chronic disease state;  Assessed social determinant of health barriers;  Review of Safety and falls prevention with the patient and making sure to be safe and mindful of her surroundings.  Symptom Management: Take medications as prescribed   Attend all scheduled provider appointments Call provider office for new concerns or questions  call the Suicide and Crisis Lifeline: 988 call the [place] National Suicide Prevention Lifeline: 210-706-4958 or TTY: 773 756 7809 TTY 518 300 4092) to talk to a trained counselor call 1-800-273-TALK (toll free, 24 hour hotline) if experiencing a Mental Health or [place]   Follow Up Plan: Telephone follow up appointment with care management team member scheduled for: 09-28-2022 at  1 pm       CCM Expected Outcome:  Monitor, Self-Manage and Reduce Symptoms of:HLD       Current Barriers:  Knowledge Deficits related to medications to help with HLD and dietary help with  maintaining healthy cholesterol levels Chronic Disease Management support and education needs related to effective management of HLD Lab Results  Component Value Date   CHOL 211 (H) 07/30/2022   HDL 35.90 (L) 07/30/2022   LDLCALC 143 (H) 02/05/2022   LDLDIRECT 128.0 07/30/2022   TRIG 293.0 (H) 07/30/2022   CHOLHDL 6 07/30/2022     Planned Interventions: Provider established cholesterol goals reviewed. Review of goals of cholesterol. The patient states she knows her levels are out of range. Education and support given; Counseled on importance of regular laboratory monitoring as prescribed. Review of having regular lab work to check levels; Provided HLD educational materials; Reviewed role and benefits of statin for ASCVD risk reduction. The patient is going to start taking her lipitor 2 times a week and the patient is going to start Tricor 145 mg daily. She has not picked this up yet but will pick up and start taking. Education on the benefits of taking medications as directed and following the plan of care prescribed by the pcp; Discussed strategies to manage statin-induced myalgias; Reviewed importance of limiting foods high in cholesterol. Review and education will send information by My Chart for the patient ; Screening for signs and symptoms of depression related to chronic disease state;  Assessed social determinant of health barriers;   Symptom Management: Take medications as prescribed   Attend all scheduled provider appointments Call provider office for new concerns or questions  call the Suicide and Crisis Lifeline: 988 call the [place] National Suicide Prevention Lifeline: (262)542-6795 or TTY: (220)064-7171 TTY 870-766-8858) to talk to a trained counselor call 1-800-273-TALK (toll free, 24 hour hotline) if experiencing a Mental Health or [place]  - call for medicine refill 2 or 3 days before it runs out - take all medications exactly as prescribed - call  doctor with any symptoms you believe are related to your medicine - call doctor when you experience any new symptoms - go to all doctor appointments as scheduled - adhere to prescribed diet: Heart healthy diet   Follow Up Plan: Telephone follow up appointment with care management team member scheduled for: 09-28-2022 at 1 pm       CCM Expected Outcome:  Monitor, Self-Manage, and Reduce Symptoms of Hypertension       Current Barriers:  Chronic Disease Management support and education needs related to effective management of HTN BP Readings from Last 3 Encounters:  07/08/22 (!) 140/80  05/24/22 (!) 140/80  05/18/22 110/64     Planned Interventions: Evaluation of current treatment plan related to hypertension self management and patient's adherence to plan as established by provider. The patient states her blood pressures have been up and down. The patient has been taking at home. Education on the benefits of continuation of monitoring of blood pressures and calling the office for abnormal readings. ;   Provided education to patient re: stroke prevention, s/s of heart attack and stroke; Reviewed prescribed diet heart healthy diet. Education and support given  Reviewed medications with patient and discussed importance of compliance. The patient is compliant with medications. States no new concerns at this time with medications ;  Discussed plans with patient for ongoing care management follow up and provided patient with direct contact information for care management  team; Advised patient, providing education and rationale, to monitor blood pressure daily and record, calling PCP for findings outside established parameters;  Reviewed scheduled/upcoming provider appointments including: sees pcp on a regular basis. Just had lab work will follow up in a month with pcp Advised patient to discuss changes in her blood pressures or heart health with provider; Provided education on prescribed diet heart  healthy diet ;  Discussed complications of poorly controlled blood pressure such as heart disease, stroke, circulatory complications, vision complications, kidney impairment, sexual dysfunction;  Screening for signs and symptoms of depression related to chronic disease state;  Assessed social determinant of health barriers;   Symptom Management: Take medications as prescribed   Attend all scheduled provider appointments Call provider office for new concerns or questions  call the Suicide and Crisis Lifeline: 988 call the [place] National Suicide Prevention Lifeline: (380)529-6822 or TTY: 854-169-0466 TTY (564)167-6691) to talk to a trained counselor call 1-800-273-TALK (toll free, 24 hour hotline) if experiencing a Mental Health or [place]  check blood pressure 3 times per week learn about high blood pressure keep a blood pressure log take blood pressure log to all doctor appointments call doctor for signs and symptoms of high blood pressure develop an action plan for high blood pressure keep all doctor appointments take medications for blood pressure exactly as prescribed report new symptoms to your doctor  Follow Up Plan: Telephone follow up appointment with care management team member scheduled for: 09-28-2022 at 1 pm          Patient verbalizes understanding of instructions and care plan provided today and agrees to view in [place]. Active MyChart status and patient understanding of how to access instructions and care plan via MyChart confirmed with patient.     Telephone follow up appointment with care management team member scheduled for: 09-28-2022 at 1 pm  Fat and Cholesterol Restricted Eating Plan Getting too much fat and cholesterol in your diet may cause health problems. Choosing the right foods helps keep your fat and cholesterol at normal levels. This can keep you from getting certain diseases. Your doctor may recommend an eating plan that includes: Total  fat: ______% or less of total calories a day. This is ______g of fat a day. Saturated fat: ______% or less of total calories a day. This is ______g of saturated fat a day. Cholesterol: less than _________mg a day. Fiber: ______g a day. What are tips for following this plan? General tips Work with your doctor to lose weight if you need to. Avoid: Foods with added sugar. Fried foods. Foods with trans fat or partially hydrogenated oils. This includes some margarines and baked goods. If you drink alcohol: Limit how much you have to: 0-1 drink a day for women who are not pregnant. 0-2 drinks a day for men. Know how much alcohol is in a drink. In the [place] one drink equals one 12 oz bottle of beer (355 mL), one 5 oz glass of wine (148 mL), or one 1 oz glass of hard liquor (44 mL). Reading food labels Check food labels for: Trans fats. Partially hydrogenated oils. Saturated fat (g) in each serving. Cholesterol (mg) in each serving. Fiber (g) in each serving. Choose foods with healthy fats, such as: Monounsaturated fats and polyunsaturated fats. These include olive and canola oil, flaxseeds, walnuts, almonds, and seeds. Omega-3 fats. These are found in certain fish, flaxseed oil, and ground flaxseeds. Choose grain products that have whole grains. Look for the word "whole"  as the first word in the ingredient list. Cooking Cook foods using low-fat methods. These include baking, boiling, grilling, and broiling. Eat more home-cooked foods. Eat at restaurants and buffets less often. Eat less fast food. Avoid cooking using saturated fats, such as butter, cream, palm oil, palm kernel oil, and coconut oil. Meal planning  At meals, divide your plate into four equal parts: Fill one-half of your plate with vegetables, green salads, and fruit. Fill one-fourth of your plate with whole grains. Fill one-fourth of your plate with low-fat (lean) protein foods. Eat fish that is high in omega-3 fats at  least two times a week. This includes mackerel, tuna, sardines, and salmon. Eat foods that are high in fiber, such as whole grains, beans, apples, pears, berries, broccoli, carrots, peas, and barley. What foods should I eat? Fruits All fresh, canned (in natural juice), or frozen fruits. Vegetables Fresh or frozen vegetables (raw, steamed, roasted, or grilled). Green salads. Grains Whole grains, such as whole wheat or whole grain breads, crackers, cereals, and pasta. Unsweetened oatmeal, bulgur, barley, quinoa, or brown rice. Corn or whole wheat flour tortillas. Meats and other protein foods Ground beef (85% or leaner), grass-fed beef, or beef trimmed of fat. Skinless chicken or [place]. Ground chicken or [place]. Pork trimmed of fat. All fish and seafood. Egg whites. Dried beans, peas, or lentils. Unsalted nuts or seeds. Unsalted canned beans. Nut butters without added sugar or oil. Dairy Low-fat or nonfat dairy products, such as skim or 1% milk, 2% or reduced-fat cheeses, low-fat and fat-free ricotta or cottage cheese, or plain low-fat and nonfat yogurt. Fats and oils Tub margarine without trans fats. Light or reduced-fat mayonnaise and salad dressings. Avocado. Olive, canola, sesame, or safflower oils. The items listed above may not be a complete list of foods and beverages you can eat. Contact a dietitian for more information. What foods should I avoid? Fruits Canned fruit in heavy syrup. Fruit in cream or butter sauce. Fried fruit. Vegetables Vegetables cooked in cheese, cream, or butter sauce. Fried vegetables. Grains White bread. White pasta. White rice. Cornbread. Bagels, pastries, and croissants. Crackers and snack foods that contain trans fat and hydrogenated oils. Meats and other protein foods Fatty cuts of meat. Ribs, chicken wings, bacon, sausage, bologna, salami, chitterlings, fatback, hot dogs, bratwurst, and packaged lunch meats. Liver and organ meats. Whole eggs and egg yolks.  Chicken and [place] with skin. Fried meat. Dairy Whole or 2% milk, cream, half-and-half, and cream cheese. Whole milk cheeses. Whole-fat or sweetened yogurt. Full-fat cheeses. Nondairy creamers and whipped toppings. Processed cheese, cheese spreads, and cheese curds. Fats and oils Butter, stick margarine, lard, shortening, ghee, or bacon fat. Coconut, palm kernel, and palm oils. Beverages Alcohol. Sugar-sweetened drinks such as sodas, lemonade, and fruit drinks. Sweets and desserts Corn syrup, sugars, honey, and molasses. Candy. Jam and jelly. Syrup. Sweetened cereals. Cookies, pies, cakes, donuts, muffins, and ice cream. The items listed above may not be a complete list of foods and beverages you should avoid. Contact a dietitian for more information. Summary Choosing the right foods helps keep your fat and cholesterol at normal levels. This can keep you from getting certain diseases. At meals, fill one-half of your plate with vegetables, green salads, and fruits. Eat high fiber foods, like whole grains, beans, apples, pears, berries, carrots, peas, and barley. Limit added sugar, saturated fats, alcohol, and fried foods. This information is not intended to replace advice given to you by your health care provider. Make sure you  discuss any questions you have with your health care provider. Document Revised: 11/21/2020 Document Reviewed: 11/21/2020 Elsevier Patient Education  [place]. Cholesterol Content in Foods Cholesterol is a waxy, fat-like substance that helps to carry fat in the blood. The body needs cholesterol in small amounts, but too much cholesterol can cause damage to the arteries and heart. What foods have cholesterol?  Cholesterol is found in animal-based foods, such as meat, seafood, and dairy. Generally, low-fat dairy and lean meats have less cholesterol than full-fat dairy and fatty meats. The milligrams of cholesterol per serving (mg per serving) of common  cholesterol-containing foods are listed below. Meats and other proteins Egg -- one large whole egg has 186 mg. Veal shank -- 4 oz (113 g) has 141 mg. Lean ground [place] (93% lean) -- 4 oz (113 g) has 118 mg. Fat-trimmed lamb loin -- 4 oz (113 g) has 106 mg. Lean ground beef (90% lean) -- 4 oz (113 g) has 100 mg. Lobster -- 3.5 oz (99 g) has 90 mg. Pork loin chops -- 4 oz (113 g) has 86 mg. Canned salmon -- 3.5 oz (99 g) has 83 mg. Fat-trimmed beef top loin -- 4 oz (113 g) has 78 mg. Frankfurter -- 1 frank (3.5 oz or 99 g) has 77 mg. Crab -- 3.5 oz (99 g) has 71 mg. Roasted chicken without skin, white meat -- 4 oz (113 g) has 66 mg. Light bologna -- 2 oz (57 g) has 45 mg. Deli-cut [place] -- 2 oz (57 g) has 31 mg. Canned tuna -- 3.5 oz (99 g) has 31 mg. Berniece Salines -- 1 oz (28 g) has 29 mg. Oysters and mussels (raw) -- 3.5 oz (99 g) has 25 mg. Mackerel -- 1 oz (28 g) has 22 mg. Trout -- 1 oz (28 g) has 20 mg. Pork sausage -- 1 link (1 oz or 28 g) has 17 mg. Salmon -- 1 oz (28 g) has 16 mg. Tilapia -- 1 oz (28 g) has 14 mg. Dairy Soft-serve ice cream --  cup (4 oz or 86 g) has 103 mg. Whole-milk yogurt -- 1 cup (8 oz or 245 g) has 29 mg. Cheddar cheese -- 1 oz (28 g) has 28 mg. American cheese -- 1 oz (28 g) has 28 mg. Whole milk -- 1 cup (8 oz or 250 mL) has 23 mg. 2% milk -- 1 cup (8 oz or 250 mL) has 18 mg. Cream cheese -- 1 tablespoon (Tbsp) (14.5 g) has 15 mg. Cottage cheese --  cup (4 oz or 113 g) has 14 mg. Low-fat (1%) milk -- 1 cup (8 oz or 250 mL) has 10 mg. Sour cream -- 1 Tbsp (12 g) has 8.5 mg. Low-fat yogurt -- 1 cup (8 oz or 245 g) has 8 mg. Nonfat Greek yogurt -- 1 cup (8 oz or 228 g) has 7 mg. Half-and-half cream -- 1 Tbsp (15 mL) has 5 mg. Fats and oils Cod liver oil -- 1 tablespoon (Tbsp) (13.6 g) has 82 mg. Butter -- 1 Tbsp (14 g) has 15 mg. Lard -- 1 Tbsp (12.8 g) has 14 mg. Bacon grease -- 1 Tbsp (12.9 g) has 14 mg. Mayonnaise -- 1 Tbsp (13.8 g) has 5-10  mg. Margarine -- 1 Tbsp (14 g) has 3-10 mg. The items listed above may not be a complete list of foods with cholesterol. Exact amounts of cholesterol in these foods may vary depending on specific ingredients and brands. Contact a dietitian for  more information. What foods do not have cholesterol? Most plant-based foods do not have cholesterol unless you combine them with a food that has cholesterol. Foods without cholesterol include: Grains and cereals. Vegetables. Fruits. Vegetable oils, such as olive, canola, and sunflower oil. Legumes, such as peas, beans, and lentils. Nuts and seeds. Egg whites. The items listed above may not be a complete list of foods that do not have cholesterol. Contact a dietitian for more information. Summary The body needs cholesterol in small amounts, but too much cholesterol can cause damage to the arteries and heart. Cholesterol is found in animal-based foods, such as meat, seafood, and dairy. Generally, low-fat dairy and lean meats have less cholesterol than full-fat dairy and fatty meats. This information is not intended to replace advice given to you by your health care provider. Make sure you discuss any questions you have with your health care provider. Document Revised: 11/21/2020 Document Reviewed: 11/21/2020 Elsevier Patient Education  [place]. Blood Pressure Record Sheet To take your blood pressure, you will need a blood pressure machine. You may be prescribed one, or you can buy a blood pressure machine (blood pressure monitor) at your clinic, drug store, or online. When choosing one, look for these features: An automatic monitor that has an arm cuff. A cuff that wraps snugly, but not too tightly, around your upper arm. You should be able to fit only one finger between your arm and the cuff. A device that stores blood pressure reading results. Do not choose a monitor that measures your blood pressure from your wrist or finger. Follow your  health care provider's instructions for how to take your blood pressure. To use this form: Get one reading in the morning (a.m.) before you take any medicines. Get one reading in the evening (p.m.) before supper. Take at least two readings with each blood pressure check. This makes sure the results are correct. Wait 1-2 minutes between measurements. Write down the results in the spaces on this form. Repeat this once a week, or as told by your health care provider. Make a follow-up appointment with your health care provider to discuss the results. Blood pressure log Date: _______________________ a.m. _____________________(1st reading) _____________________(2nd reading) p.m. _____________________(1st reading) _____________________(2nd reading) Date: _______________________ a.m. _____________________(1st reading) _____________________(2nd reading) p.m. _____________________(1st reading) _____________________(2nd reading) Date: _______________________ a.m. _____________________(1st reading) _____________________(2nd reading) p.m. _____________________(1st reading) _____________________(2nd reading) Date: _______________________ a.m. _____________________(1st reading) _____________________(2nd reading) p.m. _____________________(1st reading) _____________________(2nd reading) Date: _______________________ a.m. _____________________(1st reading) _____________________(2nd reading) p.m. _____________________(1st reading) _____________________(2nd reading) This information is not intended to replace advice given to you by your health care provider. Make sure you discuss any questions you have with your health care provider. Document Revised: 03/26/2021 Document Reviewed: 03/26/2021 Elsevier Patient Education  [place]. Hypertension, Adult Hypertension is another name for high blood pressure. High blood pressure forces your heart to work harder to pump blood. This can cause problems over  time. There are two numbers in a blood pressure reading. There is a top number (systolic) over a bottom number (diastolic). It is best to have a blood pressure that is below 120/80. What are the causes? The cause of this condition is not known. Some other conditions can lead to high blood pressure. What increases the risk? Some lifestyle factors can make you more likely to develop high blood pressure: Smoking. Not getting enough exercise or physical activity. Being overweight. Having too much fat, sugar, calories, or salt (sodium) in your diet. Drinking too much alcohol. Other  risk factors include: Having any of these conditions: Heart disease. Diabetes. High cholesterol. Kidney disease. Obstructive sleep apnea. Having a family history of high blood pressure and high cholesterol. Age. The risk increases with age. Stress. What are the signs or symptoms? High blood pressure may not cause symptoms. Very high blood pressure (hypertensive crisis) may cause: Headache. Fast or uneven heartbeats (palpitations). Shortness of breath. Nosebleed. Vomiting or feeling like you may vomit (nauseous). Changes in how you see. Very bad chest pain. Feeling dizzy. Seizures. How is this treated? This condition is treated by making healthy lifestyle changes, such as: Eating healthy foods. Exercising more. Drinking less alcohol. Your doctor may prescribe medicine if lifestyle changes do not help enough and if: Your top number is above 130. Your bottom number is above 80. Your personal target blood pressure may vary. Follow these instructions at home: Eating and drinking  If told, follow the DASH eating plan. To follow this plan: Fill one half of your plate at each meal with fruits and vegetables. Fill one fourth of your plate at each meal with whole grains. Whole grains include whole-wheat pasta, brown rice, and whole-grain bread. Eat or drink low-fat dairy products, such as skim milk or  low-fat yogurt. Fill one fourth of your plate at each meal with low-fat (lean) proteins. Low-fat proteins include fish, chicken without skin, eggs, beans, and tofu. Avoid fatty meat, cured and processed meat, or chicken with skin. Avoid pre-made or processed food. Limit the amount of salt in your diet to less than 1,500 mg each day. Do not drink alcohol if: Your doctor tells you not to drink. You are pregnant, may be pregnant, or are planning to become pregnant. If you drink alcohol: Limit how much you have to: 0-1 drink a day for women. 0-2 drinks a day for men. Know how much alcohol is in your drink. In the [place] one drink equals one 12 oz bottle of beer (355 mL), one 5 oz glass of wine (148 mL), or one 1 oz glass of hard liquor (44 mL). Lifestyle  Work with your doctor to stay at a healthy weight or to lose weight. Ask your doctor what the best weight is for you. Get at least 30 minutes of exercise that causes your heart to beat faster (aerobic exercise) most days of the week. This may include walking, swimming, or biking. Get at least 30 minutes of exercise that strengthens your muscles (resistance exercise) at least 3 days a week. This may include lifting weights or doing Pilates. Do not smoke or use any products that contain nicotine or tobacco. If you need help quitting, ask your doctor. Check your blood pressure at home as told by your doctor. Keep all follow-up visits. Medicines Take over-the-counter and prescription medicines only as told by your doctor. Follow directions carefully. Do not skip doses of blood pressure medicine. The medicine does not work as well if you skip doses. Skipping doses also puts you at risk for problems. Ask your doctor about side effects or reactions to medicines that you should watch for. Contact a doctor if: You think you are having a reaction to the medicine you are taking. You have headaches that keep coming back. You feel dizzy. You have  swelling in your ankles. You have trouble with your vision. Get help right away if: You get a very bad headache. You start to feel mixed up (confused). You feel weak or numb. You feel faint. You have very bad pain in your:  Chest. Belly (abdomen). You vomit more than once. You have trouble breathing. These symptoms may be an emergency. Get help right away. Call 911. Do not wait to see if the symptoms will go away. Do not drive yourself to the hospital. Summary Hypertension is another name for high blood pressure. High blood pressure forces your heart to work harder to pump blood. For most people, a normal blood pressure is less than 120/80. Making healthy choices can help lower blood pressure. If your blood pressure does not get lower with healthy choices, you may need to take medicine. This information is not intended to replace advice given to you by your health care provider. Make sure you discuss any questions you have with your health care provider. Document Revised: 04/30/2021 Document Reviewed: 04/30/2021 Elsevier Patient Education  [place].

## 2022-08-03 NOTE — Plan of Care (Signed)
Chronic Care Management Provider Comprehensive Care Plan    08/03/2022 Name: Heather Shaw MRN: [id number] DOB: 12/30/1940  Referral to Chronic Care Management (CCM) services was placed by Provider:  Wilfred Lacy, NP on Date: 06-22-2022.  Chronic Condition 1: HTN Provider Assessment and Plan Unable to tolerate losartan: caused myalgia. Home BP readings: 154/73, 155/81, 149/77, 133/75, 124/71, 154/82, 134/68, 131/61, 125/61. Instructed by cardiology: Dr. Johnsie Cancel to increase amlodipine to '10mg'$  BID and maintain HCTZ 12.'5mg'$  daily. Home BP machine reads significantly higher compared to manual BP check. Advised to get a new BP machine    BP Readings from Last 3 Encounters:  05/24/22 (!) 140/80  05/18/22 110/64  03/18/22 132/70    Maintain med doses F/up in 65month     Expected Outcome/Goals Addressed This Visit (Provider CCM goals/Provider Assessment and plan  CCM (HYPERTENSION)  EXPECTED OUTCOME:  MONITOR,SELF- MANAGE AND REDUCE SYMPTOMS OF HYPERTENSION   Symptom Management Condition 1: Take all medications as prescribed Attend all scheduled provider appointments Call provider office for new concerns or questions  call the Suicide and Crisis Lifeline: 988 call the [place] Suicide Prevention Lifeline: 15706852224or TTY: 14700339908TTY ([phone] to talk to a trained counselor call 1-800-273-TALK (toll free, 24 hour hotline) if experiencing a Mental Health or [place] check blood pressure 3 times per week learn about high blood pressure call doctor for signs and symptoms of high blood pressure keep all doctor appointments take medications for blood pressure exactly as prescribed report new symptoms to your doctor eat more whole grains, fruits and vegetables, lean meats and healthy fats  Chronic Condition 2: HLD Provider Assessment and Plan  Relevant Medications     amLODipine (NORVASC) 10 MG tablet    Other Relevant Orders    Lipid panel      Expected Outcome/Goals Addressed This Visit (Provider CCM goals/Provider Assessment and plan   CCM (HLD)  EXPECTED OUTCOME:  MONITOR,SELF- MANAGE AND REDUCE SYMPTOMS OF HLD  Symptom Management Condition 2: Take all medications as prescribed Attend all scheduled provider appointments Call provider office for new concerns or questions  call the Suicide and Crisis Lifeline: 988 call the [place] Suicide Prevention Lifeline: 1332-600-9416or TTY: 17072840724TMaili((236) [phone] to talk to a trained counselor call 1-800-273-TALK (toll free, 24 hour hotline) if experiencing a Mental Health or [place] call for medicine refill 2 or 3 days before it runs out take all medications exactly as prescribed call doctor with any symptoms you believe are related to your medicine call doctor when you experience any new symptoms go to all doctor appointments as scheduled adhere to prescribed diet: heart healthy diet   Chronic Condition 3: Chronic Pain Provider Assessment and Plan  Relevant Medications     HYDROcodone-acetaminophen ([place] 7.5-325 MG tablet (Start on 06/11/2022)  Controlled pain with hydrocodone and gabapentin. Denies any adverse effects. PMPM database review: no red flags, last filled 06/11/2022.        Expected Outcome/Goals Addressed This Visit (Provider CCM goals/Provider Assessment and plan   CCM (Chronic Pain)  EXPECTED OUTCOME:  MONITOR,SELF- MANAGE AND REDUCE SYMPTOMS OF Chronic Pain   Symptom Management Condition 3: Take all medications as prescribed Attend all scheduled provider appointments Call provider office for new concerns or questions  call the Suicide and Crisis Lifeline: 988 call the [place] Suicide Prevention Lifeline: 1(380)871-7640or TTY: 1212-455-0155TTY ((918)151-0790 to talk to a trained counselor call 1-800-273-TALK (toll free, 24 hour hotline) if experiencing  a Mental Health or Behavioral Health Crisis   Problem  List Patient Active Problem List   Diagnosis Date Noted   Angio-edema 03/27/2021   Hyperglycemia 09/25/2020   Hardening of the aorta (main artery of the heart) ([place]) 09/25/2020   Opioid type dependence, continuous ([place]) 08/15/2019   Chronic pain 08/11/2017   Hearing loss 11/23/2016   SVT (supraventricular tachycardia)    Bradycardia 12/27/2015   Carpal tunnel syndrome 09/19/2014   Eczema 09/19/2014   Chronic interstitial cystitis 05/20/2014   Lumbar radiculopathy, chronic 12/31/2013   Degeneration of lumbosacral intervertebral disc 04/07/2011   Vitamin D deficiency 10/03/2009   HLD (hyperlipidemia) 07/16/2009   Hypertensive disorder 07/16/2009    Medication Management  Current Outpatient Medications:    amLODipine (NORVASC) 10 MG tablet, Take 1 tablet (10 mg total) by mouth daily., Disp: 90 tablet, Rfl: 3   atorvastatin (LIPITOR) 20 MG tablet, Take 1 tablet (20 mg total) by mouth 2 (two) times a week., Disp: 24 tablet, Rfl: 3   fenofibrate (TRICOR) 145 MG tablet, Take 1 tablet (145 mg total) by mouth daily., Disp: 90 tablet, Rfl: 1   fluocinonide-emollient (LIDEX-E) 0.05 % cream, Apply 1 application topically 2 (two) times daily., Disp: 30 g, Rfl: 0   gabapentin (NEURONTIN) 100 MG capsule, Take 2 capsules (200 mg total) by mouth at bedtime., Disp: 90 capsule, Rfl: 3   hydrochlorothiazide (HYDRODIURIL) 25 MG tablet, Take 1 tablet (25 mg total) by mouth daily., Disp: 90 tablet, Rfl: 1   HYDROcodone-acetaminophen (NORCO) 7.5-325 MG tablet, Take 1 tablet by mouth every 8 (eight) hours., Disp: 90 tablet, Rfl: 0   meclizine (ANTIVERT) 25 MG tablet, Take 25 mg by mouth 3 (three) times daily as needed for dizziness., Disp: , Rfl:    Vitamin D, Ergocalciferol, (DRISDOL) 1.25 MG (50000 UNIT) CAPS capsule, Take 1 capsule (50,000 Units total) by mouth every 7 (seven) days., Disp: 12 capsule, Rfl: 0  Cognitive Assessment Identity Confirmed: : Name; DOB Cognitive Status: Normal   Functional  Assessment Hearing Difficulty or Deaf: yes Hearing Management: diminised in right, no hearing aides Wear Glasses or Blind: yes Vision Management: wears glasses Concentrating, Remembering or Making Decisions Difficulty (CP): no Difficulty Communicating: no Difficulty Eating/Swallowing: no Walking or Climbing Stairs Difficulty: no Dressing/Bathing Difficulty: no Doing Errands Independently Difficulty (such as shopping) (CP): no Change in Functional Status Since Onset of Current Illness/Injury: no   Caregiver Assessment  Primary Source of Support/Comfort: child(ren) Name of Support/Comfort Primary Source: children- Karsten Fells- another daughter and 3 sons People in Home: alone Name(s) of People in Home: the patient lives alone Family Caregiver if Needed: child(ren), adult Family Caregiver Names: daughter Margreta Journey and other children Primary Roles/Responsibilities: retired Expected Impact of Illness/Hospitalization: patient reports she is independent Concerns About Impact on Relationships: supportive relationships   Planned Interventions  Reviewed provider established plan for pain management. The patient has chronic pain, specifically with back pain and arthritis. The patient has long term use of Opioids and sees provider on a regular basis. The patient rates her pain today at a 6. States that it is hardly ever under a 6; Discussed importance of adherence to all scheduled medical appointments; Counseled on the importance of reporting any/all new or changed pain symptoms or management strategies to pain management provider; Advised patient to report to care team affect of pain on daily activities. The patient states that if she did not have the pain medications she could not function. She is thankful that she has good support  system with her providers. ; Discussed use of relaxation techniques and/or diversional activities to assist with pain reduction (distraction, imagery,  relaxation, massage, acupressure, TENS, heat, and cold application. The patient uses heat application and cold application also. The patient states she has a system that works for her well. Education and support given; Reviewed with patient prescribed pharmacological and nonpharmacological pain relief strategies. Review and education provided; Advised patient to discuss changes in her level of pain or intensity of pain, or unresolved pain  with provider; Screening for signs and symptoms of depression related to chronic disease state;  Assessed social determinant of health barriers;  Review of Safety and falls prevention with the patient and making sure to be safe and mindful of her surroundings. Provider established cholesterol goals reviewed. Review of goals of cholesterol. The patient states she knows her levels are out of range. Education and support given; Counseled on importance of regular laboratory monitoring as prescribed. Review of having regular lab work to check levels; Provided HLD educational materials; Reviewed role and benefits of statin for ASCVD risk reduction. The patient is going to start taking her lipitor 2 times a week and the patient is going to start Tricor 145 mg daily. She has not picked this up yet but will pick up and start taking. Education on the benefits of taking medications as directed and following the plan of care prescribed by the pcp; Discussed strategies to manage statin-induced myalgias; Reviewed importance of limiting foods high in cholesterol. Review and education will send information by My Chart for the patient ; Screening for signs and symptoms of depression related to chronic disease state;  Assessed social determinant of health barriers;  Evaluation of current treatment plan related to hypertension self management and patient's adherence to plan as established by provider. The patient states her blood pressures have been up and down. The patient has been  taking at home. Education on the benefits of continuation of monitoring of blood pressures and calling the office for abnormal readings. ;   Provided education to patient re: stroke prevention, s/s of heart attack and stroke; Reviewed prescribed diet heart healthy diet. Education and support given  Reviewed medications with patient and discussed importance of compliance. The patient is compliant with medications. States no new concerns at this time with medications ;  Discussed plans with patient for ongoing care management follow up and provided patient with direct contact information for care management team; Advised patient, providing education and rationale, to monitor blood pressure daily and record, calling PCP for findings outside established parameters;  Reviewed scheduled/upcoming provider appointments including: sees pcp on a regular basis. Just had lab work will follow up in a month with pcp Advised patient to discuss changes in her blood pressures or heart health with provider; Provided education on prescribed diet heart healthy diet ;  Discussed complications of poorly controlled blood pressure such as heart disease, stroke, circulatory complications, vision complications, kidney impairment, sexual dysfunction;  Screening for signs and symptoms of depression related to chronic disease state;  Assessed social determinant of health barriers;      Interaction and coordination with outside resources, practitioners, and providers See CCM Referral  Care Plan: Available in MyChart

## 2022-08-03 NOTE — Chronic Care Management (AMB) (Signed)
Chronic Care Management   CCM RN Visit Note  08/03/2022 Name: Heather Shaw MRN: [id number] DOB: 10-28-1940  Subjective: Heather Shaw is a 82 y.o. year old female who is a primary care patient of Heather Shaw, Heather Brooke, NP. The patient was referred to the Chronic Care Management team for assistance with care management needs subsequent to provider initiation of CCM services and plan of care.    Today's Visit:  Engaged with patient by telephone for initial visit.     SDOH Interventions Today    Flowsheet Row Most Recent Value  SDOH Interventions   Food Insecurity Interventions Intervention Not Indicated  Housing Interventions Intervention Not Indicated  Transportation Interventions Intervention Not Indicated  Utilities Interventions Intervention Not Indicated  Alcohol Usage Interventions Intervention Not Indicated (Score <7)  Financial Strain Interventions Intervention Not Indicated  Physical Activity Interventions Intervention Not Indicated, Other (Comments)  [the patient does not do structured active, encouraged activity]  Stress Interventions Intervention Not Indicated  Social Connections Interventions Intervention Not Indicated, Other (Comment)  [good support system]         Goals Addressed             This Visit's Progress    CCM Expected Outcome:  Monitor, Self-Manage and Reduce Symptoms of: Chronic Pain       Current Barriers:  Chronic Disease Management support and education needs related to for effective management of Chronic Pain with long term Opioid use  Planned Interventions: Reviewed provider established plan for pain management. The patient has chronic pain, specifically with back pain and arthritis. The patient has long term use of Opioids and sees provider on a regular basis. The patient rates her pain today at a 6. States that it is hardly ever under a 6; Discussed importance of adherence to all scheduled medical appointments; Counseled on the  importance of reporting any/all new or changed pain symptoms or management strategies to pain management provider; Advised patient to report to care team affect of pain on daily activities. The patient states that if she did not have the pain medications she could not function. She is thankful that she has good support system with her providers. ; Discussed use of relaxation techniques and/or diversional activities to assist with pain reduction (distraction, imagery, relaxation, massage, acupressure, TENS, heat, and cold application. The patient uses heat application and cold application also. The patient states she has a system that works for her well. Education and support given; Reviewed with patient prescribed pharmacological and nonpharmacological pain relief strategies. Review and education provided; Advised patient to discuss changes in her level of pain or intensity of pain, or unresolved pain  with provider; Screening for signs and symptoms of depression related to chronic disease state;  Assessed social determinant of health barriers;  Review of Safety and falls prevention with the patient and making sure to be safe and mindful of her surroundings.  Symptom Management: Take medications as prescribed   Attend all scheduled provider appointments Call provider office for new concerns or questions  call the Suicide and Crisis Lifeline: 988 call the [place] National Suicide Prevention Lifeline: 2527293120 or TTY: (510)677-3154 TTY [phone]) to talk to a trained counselor call 1-800-273-TALK (toll free, 24 hour hotline) if experiencing a Mental Health or [place] Crisis   Follow Up Plan: Telephone follow up appointment with care management team member scheduled for: 09-28-2022 at 1 pm       CCM Expected Outcome:  Monitor, Self-Manage and Reduce Symptoms of:HLD  Current Barriers:  Knowledge Deficits related to medications to help with HLD and dietary help with maintaining  healthy cholesterol levels Chronic Disease Management support and education needs related to effective management of HLD Lab Results  Component Value Date   CHOL 211 (H) 07/30/2022   HDL 35.90 (L) 07/30/2022   LDLCALC 143 (H) 02/05/2022   LDLDIRECT 128.0 07/30/2022   TRIG 293.0 (H) 07/30/2022   CHOLHDL 6 07/30/2022     Planned Interventions: Provider established cholesterol goals reviewed. Review of goals of cholesterol. The patient states she knows her levels are out of range. Education and support given; Counseled on importance of regular laboratory monitoring as prescribed. Review of having regular lab work to check levels; Provided HLD educational materials; Reviewed role and benefits of statin for ASCVD risk reduction. The patient is going to start taking her lipitor 2 times a week and the patient is going to start Tricor 145 mg daily. She has not picked this up yet but will pick up and start taking. Education on the benefits of taking medications as directed and following the plan of care prescribed by the pcp; Discussed strategies to manage statin-induced myalgias; Reviewed importance of limiting foods high in cholesterol. Review and education will send information by My Chart for the patient ; Screening for signs and symptoms of depression related to chronic disease state;  Assessed social determinant of health barriers;   Symptom Management: Take medications as prescribed   Attend all scheduled provider appointments Call provider office for new concerns or questions  call the Suicide and Crisis Lifeline: 988 call the [place] National Suicide Prevention Lifeline: (567)884-8088 or TTY: (435) 214-0695 TTY 873-872-0391) to talk to a trained counselor call 1-800-273-TALK (toll free, 24 hour hotline) if experiencing a Mental Health or [place]  - call for medicine refill 2 or 3 days before it runs out - take all medications exactly as prescribed - call doctor with any  symptoms you believe are related to your medicine - call doctor when you experience any new symptoms - go to all doctor appointments as scheduled - adhere to prescribed diet: Heart healthy diet   Follow Up Plan: Telephone follow up appointment with care management team member scheduled for: 09-28-2022 at 1 pm       CCM Expected Outcome:  Monitor, Self-Manage, and Reduce Symptoms of Hypertension       Current Barriers:  Chronic Disease Management support and education needs related to effective management of HTN BP Readings from Last 3 Encounters:  07/08/22 (!) 140/80  05/24/22 (!) 140/80  05/18/22 110/64     Planned Interventions: Evaluation of current treatment plan related to hypertension self management and patient's adherence to plan as established by provider. The patient states her blood pressures have been up and down. The patient has been taking at home. Education on the benefits of continuation of monitoring of blood pressures and calling the office for abnormal readings. ;   Provided education to patient re: stroke prevention, s/s of heart attack and stroke; Reviewed prescribed diet heart healthy diet. Education and support given  Reviewed medications with patient and discussed importance of compliance. The patient is compliant with medications. States no new concerns at this time with medications ;  Discussed plans with patient for ongoing care management follow up and provided patient with direct contact information for care management team; Advised patient, providing education and rationale, to monitor blood pressure daily and record, calling PCP for findings outside established parameters;  Reviewed scheduled/upcoming  provider appointments including: sees pcp on a regular basis. Just had lab work will follow up in a month with pcp Advised patient to discuss changes in her blood pressures or heart health with provider; Provided education on prescribed diet heart healthy diet ;   Discussed complications of poorly controlled blood pressure such as heart disease, stroke, circulatory complications, vision complications, kidney impairment, sexual dysfunction;  Screening for signs and symptoms of depression related to chronic disease state;  Assessed social determinant of health barriers;   Symptom Management: Take medications as prescribed   Attend all scheduled provider appointments Call provider office for new concerns or questions  call the Suicide and Crisis Lifeline: 988 call the [place] National Suicide Prevention Lifeline: 213-442-1314 or TTY: 929-637-8008 TTY 873-647-0473) to talk to a trained counselor call 1-800-273-TALK (toll free, 24 hour hotline) if experiencing a Mental Health or [place]  check blood pressure 3 times per week learn about high blood pressure keep a blood pressure log take blood pressure log to all doctor appointments call doctor for signs and symptoms of high blood pressure develop an action plan for high blood pressure keep all doctor appointments take medications for blood pressure exactly as prescribed report new symptoms to your doctor  Follow Up Plan: Telephone follow up appointment with care management team member scheduled for: 09-28-2022 at 1 pm          Plan:Telephone follow up appointment with care management team member scheduled for:  09-28-2022 at 1 pm  Noreene Larsson RN, MSN, CCM RN Care Manager  Chronic Care Management Direct Number: [phone]

## 2022-08-06 ENCOUNTER — Telehealth: Payer: Self-pay | Admitting: Nurse Practitioner

## 2022-08-06 NOTE — Telephone Encounter (Signed)
Caller Name: pt Call back phone #: [phone]   MEDICATION(S):  HYDROcodone-acetaminophen   Days of Med Remaining:   Has the patient contacted their pharmacy (YES/NO)? no What did pharmacy advise?   Preferred Pharmacy:  [facility] Phone: [phone]  Fax: [phone]       ~~~Please advise patient/caregiver to allow 2-3 business days to process RX refills.

## 2022-08-09 ENCOUNTER — Other Ambulatory Visit: Payer: Self-pay | Admitting: Nurse Practitioner

## 2022-08-09 ENCOUNTER — Encounter: Payer: Self-pay | Admitting: Nurse Practitioner

## 2022-08-09 DIAGNOSIS — M5137 Other intervertebral disc degeneration, lumbosacral region: Secondary | ICD-10-CM

## 2022-08-09 DIAGNOSIS — M4807 Spinal stenosis, lumbosacral region: Secondary | ICD-10-CM

## 2022-08-09 DIAGNOSIS — G8929 Other chronic pain: Secondary | ICD-10-CM

## 2022-08-09 DIAGNOSIS — F1120 Opioid dependence, uncomplicated: Secondary | ICD-10-CM

## 2022-08-10 ENCOUNTER — Other Ambulatory Visit (HOSPITAL_COMMUNITY): Payer: Self-pay

## 2022-08-10 MED ORDER — HYDROCODONE-ACETAMINOPHEN 7.5-325 MG PO TABS
1.0000 | ORAL_TABLET | Freq: Three times a day (TID) | ORAL | 0 refills | Status: DC
  Filled 2022-08-12 – ????-??-?? (×2): qty 90, 30d supply, fill #0

## 2022-08-12 ENCOUNTER — Other Ambulatory Visit (HOSPITAL_COMMUNITY): Payer: Self-pay

## 2022-08-25 DIAGNOSIS — E785 Hyperlipidemia, unspecified: Secondary | ICD-10-CM | POA: Diagnosis not present

## 2022-08-25 DIAGNOSIS — I10 Essential (primary) hypertension: Secondary | ICD-10-CM

## 2022-09-02 ENCOUNTER — Telehealth: Payer: Medicare Other

## 2022-09-06 ENCOUNTER — Encounter: Payer: Self-pay | Admitting: Nurse Practitioner

## 2022-09-06 ENCOUNTER — Other Ambulatory Visit (HOSPITAL_COMMUNITY): Payer: Self-pay

## 2022-09-06 ENCOUNTER — Ambulatory Visit (INDEPENDENT_AMBULATORY_CARE_PROVIDER_SITE_OTHER): Payer: Medicare Other | Admitting: Nurse Practitioner

## 2022-09-06 VITALS — BP 126/78 | HR 90 | Temp 98.2°F | Resp 16 | Ht 64.5 in | Wt 212.0 lb

## 2022-09-06 DIAGNOSIS — I10 Essential (primary) hypertension: Secondary | ICD-10-CM

## 2022-09-06 DIAGNOSIS — I700 Atherosclerosis of aorta: Secondary | ICD-10-CM

## 2022-09-06 DIAGNOSIS — G720 Drug-induced myopathy: Secondary | ICD-10-CM

## 2022-09-06 DIAGNOSIS — T466X5A Adverse effect of antihyperlipidemic and antiarteriosclerotic drugs, initial encounter: Secondary | ICD-10-CM | POA: Insufficient documentation

## 2022-09-06 DIAGNOSIS — E782 Mixed hyperlipidemia: Secondary | ICD-10-CM

## 2022-09-06 DIAGNOSIS — G8929 Other chronic pain: Secondary | ICD-10-CM

## 2022-09-06 DIAGNOSIS — M51379 Other intervertebral disc degeneration, lumbosacral region without mention of lumbar back pain or lower extremity pain: Secondary | ICD-10-CM

## 2022-09-06 DIAGNOSIS — M4807 Spinal stenosis, lumbosacral region: Secondary | ICD-10-CM

## 2022-09-06 DIAGNOSIS — M5137 Other intervertebral disc degeneration, lumbosacral region: Secondary | ICD-10-CM

## 2022-09-06 DIAGNOSIS — I471 Supraventricular tachycardia, unspecified: Secondary | ICD-10-CM | POA: Diagnosis not present

## 2022-09-06 DIAGNOSIS — Z79899 Other long term (current) drug therapy: Secondary | ICD-10-CM

## 2022-09-06 DIAGNOSIS — F1120 Opioid dependence, uncomplicated: Secondary | ICD-10-CM

## 2022-09-06 MED ORDER — HYDROCODONE-ACETAMINOPHEN 7.5-325 MG PO TABS
1.0000 | ORAL_TABLET | Freq: Three times a day (TID) | ORAL | 0 refills | Status: DC
  Filled 2022-09-10 (×2): qty 90, 30d supply, fill #0

## 2022-09-06 NOTE — Progress Notes (Signed)
Established Patient Visit  Patient: Heather Shaw   DOB: 06/11/1941   82 y.o. Female  MRN: [id number] Visit Date: 09/06/2022  Subjective:    Chief Complaint  Patient presents with   Office visit    Med refill Hydrocodone -  unable to take cholesterol medication myalgia    HPI SVT (supraventricular tachycardia) ([place]) Weaned off atenolol due to bradycardia Denies any palpitations or dizziness or syncope  Hypertensive disorder BP at goal with amlodipine and HCTZ BP Readings from Last 3 Encounters:  09/06/22 126/78  07/08/22 (!) 140/80  05/24/22 (!) 140/80    Maintain med doses  Opioid type dependence, continuous ([place]) Stable Denies any adverse effects PMP database reviewed today: last filled 08/12/2022. UDS collected today Contract due for renewal 02/2023 Med refill sent to be filled 09/10/22 F/up 12month  HLD (hyperlipidemia) Unable to tolerate atorvastatin 1x/week and fenofibrate (myalgia and muscle weakness) She has upcoming appt with Dr. HDebara Pickettin June 2024  Reviewed medical, surgical, and social history today  Medications: Outpatient Medications Prior to Visit  Medication Sig   amLODipine (NORVASC) 10 MG tablet Take 1 tablet (10 mg total) by mouth daily.   fluocinonide-emollient (LIDEX-E) 0.05 % cream Apply 1 application topically 2 (two) times daily.   gabapentin (NEURONTIN) 100 MG capsule Take 2 capsules (200 mg total) by mouth at bedtime.   hydrochlorothiazide (HYDRODIURIL) 25 MG tablet Take 1 tablet (25 mg total) by mouth daily.   meclizine (ANTIVERT) 25 MG tablet Take 25 mg by mouth 3 (three) times daily as needed for dizziness.   Vitamin D, Ergocalciferol, (DRISDOL) 1.25 MG (50000 UNIT) CAPS capsule Take 1 capsule (50,000 Units total) by mouth every 7 (seven) days.   [DISCONTINUED] atorvastatin (LIPITOR) 20 MG tablet Take 1 tablet (20 mg total) by mouth 2 (two) times a week.   [DISCONTINUED] fenofibrate (TRICOR) 145 MG tablet Take 1 tablet  (145 mg total) by mouth daily.   [DISCONTINUED] HYDROcodone-acetaminophen (NORCO) 7.5-325 MG tablet Take 1 tablet by mouth every 8 (eight) hours.   No facility-administered medications prior to visit.   Reviewed past medical and social history.   ROS per HPI above      Objective:  BP 126/78   Pulse 90   Temp 98.2 F (36.8 C) (Oral)   Resp 16   Ht 5' 4.5" (1.638 m)   Wt 212 lb (96.2 kg)   SpO2 99%   BMI 35.83 kg/m      Physical Exam Vitals and nursing note reviewed.  Constitutional:      General: She is not in acute distress.    Appearance: She is obese.  Cardiovascular:     Rate and Rhythm: Normal rate and regular rhythm.     Pulses: Normal pulses.     Heart sounds: Normal heart sounds.  Pulmonary:     Effort: Pulmonary effort is normal. No respiratory distress.     Breath sounds: Normal breath sounds.  Musculoskeletal:        General: Normal range of motion.     Right lower leg: No edema.     Left lower leg: No edema.  Skin:    General: Skin is warm and dry.  Neurological:     Mental Status: She is alert and oriented to person, place, and time.  Psychiatric:        Mood and Affect: Mood normal.        Behavior:  Behavior normal.        Thought Content: Thought content normal.     No results found for any visits on 09/06/22.    Assessment & Plan:    Problem List Items Addressed This Visit       Cardiovascular and Mediastinum   Hypertensive disorder - Primary    BP at goal with amlodipine and HCTZ BP Readings from Last 3 Encounters:  09/06/22 126/78  07/08/22 (!) 140/80  05/24/22 (!) 140/80    Maintain med doses      SVT (supraventricular tachycardia)    Weaned off atenolol due to bradycardia Denies any palpitations or dizziness or syncope        Musculoskeletal and Integument   Statin myopathy     Other   Chronic pain   Relevant Medications   HYDROcodone-acetaminophen (NORCO) 7.5-325 MG tablet (Start on 09/10/2022)   HLD (hyperlipidemia)     Unable to tolerate atorvastatin 1x/week and fenofibrate (myalgia and muscle weakness) She has upcoming appt with Dr. Debara Pickett in June 2024      Opioid type dependence, continuous ([place])    Stable Denies any adverse effects PMP database reviewed today: last filled 08/12/2022. UDS collected today Contract due for renewal 02/2023 Med refill sent to be filled 09/10/22 F/up 17month      Relevant Medications   HYDROcodone-acetaminophen (NORCO) 7.5-325 MG tablet (Start on 09/10/2022)   Other Relevant Orders   DRUG MONITORING, PANEL 8 WITH CONFIRMATION, URINE   Other Visit Diagnoses     Atherosclerosis of aorta ([place]   (Chronic)     Controlled substance agreement signed       Relevant Orders   DRUG MONITORING, PANEL 8 WITH CONFIRMATION, URINE   DDD (degenerative disc disease), lumbosacral       Relevant Medications   HYDROcodone-acetaminophen (NORCO) 7.5-325 MG tablet (Start on 09/10/2022)   Spinal stenosis of lumbosacral region       Relevant Medications   HYDROcodone-acetaminophen (NORCO) 7.5-325 MG tablet (Start on 09/10/2022)      Return in about 3 months (around 12/05/2022) for HTN, hyperlipidemia (fasting) and pain management.     CWilfred Lacy NP

## 2022-09-06 NOTE — Assessment & Plan Note (Signed)
Stable Denies any adverse effects PMP database reviewed today: last filled 08/12/2022. UDS collected today Contract due for renewal 02/2023 Med refill sent to be filled 09/10/22 F/up 35month

## 2022-09-06 NOTE — Patient Instructions (Signed)
Go to lab Maintain current medications and upcoming appt with lipid clinic  DASH Eating Plan DASH stands for Dietary Approaches to Stop Hypertension. The DASH eating plan is a healthy eating plan that has been shown to: Reduce high blood pressure (hypertension). Reduce your risk for type 2 diabetes, heart disease, and stroke. Help with weight loss. What are tips for following this plan? Reading food labels Check food labels for the amount of salt (sodium) per serving. Choose foods with less than 5 percent of the Daily Value of sodium. Generally, foods with less than 300 milligrams (mg) of sodium per serving fit into this eating plan. To find whole grains, look for the word "whole" as the first word in the ingredient list. Shopping Buy products labeled as "low-sodium" or "no salt added." Buy fresh foods. Avoid canned foods and pre-made or frozen meals. Cooking Avoid adding salt when cooking. Use salt-free seasonings or herbs instead of table salt or sea salt. Check with your health care provider or pharmacist before using salt substitutes. Do not fry foods. Cook foods using healthy methods such as baking, boiling, grilling, roasting, and broiling instead. Cook with heart-healthy oils, such as olive, canola, avocado, soybean, or sunflower oil. Meal planning  Eat a balanced diet that includes: 4 or more servings of fruits and 4 or more servings of vegetables each day. Try to fill one-half of your plate with fruits and vegetables. 6-8 servings of whole grains each day. Less than 6 oz (170 g) of lean meat, poultry, or fish each day. A 3-oz (85-g) serving of meat is about the same size as a deck of cards. One egg equals 1 oz (28 g). 2-3 servings of low-fat dairy each day. One serving is 1 cup (237 mL). 1 serving of nuts, seeds, or beans 5 times each week. 2-3 servings of heart-healthy fats. Healthy fats called omega-3 fatty acids are found in foods such as walnuts, flaxseeds, fortified milks,  and eggs. These fats are also found in cold-water fish, such as sardines, salmon, and mackerel. Limit how much you eat of: Canned or prepackaged foods. Food that is high in trans fat, such as some fried foods. Food that is high in saturated fat, such as fatty meat. Desserts and other sweets, sugary drinks, and other foods with added sugar. Full-fat dairy products. Do not salt foods before eating. Do not eat more than 4 egg yolks a week. Try to eat at least 2 vegetarian meals a week. Eat more home-cooked food and less restaurant, buffet, and fast food. Lifestyle When eating at a restaurant, ask that your food be prepared with less salt or no salt, if possible. If you drink alcohol: Limit how much you use to: 0-1 drink a day for women who are not pregnant. 0-2 drinks a day for men. Be aware of how much alcohol is in your drink. In the [place] one drink equals one 12 oz bottle of beer (355 mL), one 5 oz glass of wine (148 mL), or one 1 oz glass of hard liquor (44 mL). General information Avoid eating more than 2,300 mg of salt a day. If you have hypertension, you may need to reduce your sodium intake to 1,500 mg a day. Work with your health care provider to maintain a healthy body weight or to lose weight. Ask what an ideal weight is for you. Get at least 30 minutes of exercise that causes your heart to beat faster (aerobic exercise) most days of the week. Activities may include  walking, swimming, or biking. Work with your health care provider or dietitian to adjust your eating plan to your individual calorie needs. What foods should I eat? Fruits All fresh, dried, or frozen fruit. Canned fruit in natural juice (without added sugar). Vegetables Fresh or frozen vegetables (raw, steamed, roasted, or grilled). Low-sodium or reduced-sodium tomato and vegetable juice. Low-sodium or reduced-sodium tomato sauce and tomato paste. Low-sodium or reduced-sodium canned vegetables. Grains Whole-grain or  whole-wheat bread. Whole-grain or whole-wheat pasta. Brown rice. [place]. Bulgur. Whole-grain and low-sodium cereals. Pita bread. Low-fat, low-sodium crackers. Whole-wheat flour tortillas. Meats and other proteins Skinless chicken or [place]. Ground chicken or [place]. Pork with fat trimmed off. Fish and seafood. Egg whites. Dried beans, peas, or lentils. Unsalted nuts, nut butters, and seeds. Unsalted canned beans. Lean cuts of beef with fat trimmed off. Low-sodium, lean precooked or cured meat, such as sausages or meat loaves. Dairy Low-fat (1%) or fat-free (skim) milk. Reduced-fat, low-fat, or fat-free cheeses. Nonfat, low-sodium ricotta or cottage cheese. Low-fat or nonfat yogurt. Low-fat, low-sodium cheese. Fats and oils Soft margarine without trans fats. Vegetable oil. Reduced-fat, low-fat, or light mayonnaise and salad dressings (reduced-sodium). Canola, safflower, olive, avocado, soybean, and sunflower oils. Avocado. Seasonings and condiments Herbs. Spices. Seasoning mixes without salt. Other foods Unsalted popcorn and pretzels. Fat-free sweets. The items listed above may not be a complete list of foods and beverages you can eat. Contact a dietitian for more information. What foods should I avoid? Fruits Canned fruit in a light or heavy syrup. Fried fruit. Fruit in cream or butter sauce. Vegetables Creamed or fried vegetables. Vegetables in a cheese sauce. Regular canned vegetables (not low-sodium or reduced-sodium). Regular canned tomato sauce and paste (not low-sodium or reduced-sodium). Regular tomato and vegetable juice (not low-sodium or reduced-sodium). Angie Fava. Olives. Grains Baked goods made with fat, such as croissants, muffins, or some breads. Dry pasta or rice meal packs. Meats and other proteins Fatty cuts of meat. Ribs. Fried meat. Berniece Salines. Bologna, salami, and other precooked or cured meats, such as sausages or meat loaves. Fat from the back of a pig (fatback).  Bratwurst. Salted nuts and seeds. Canned beans with added salt. Canned or smoked fish. Whole eggs or egg yolks. Chicken or [place] with skin. Dairy Whole or 2% milk, cream, and half-and-half. Whole or full-fat cream cheese. Whole-fat or sweetened yogurt. Full-fat cheese. Nondairy creamers. Whipped toppings. Processed cheese and cheese spreads. Fats and oils Butter. Stick margarine. Lard. Shortening. Ghee. Bacon fat. Tropical oils, such as coconut, palm kernel, or palm oil. Seasonings and condiments Onion salt, garlic salt, seasoned salt, table salt, and sea salt. Worcestershire sauce. Tartar sauce. Barbecue sauce. Teriyaki sauce. Soy sauce, including reduced-sodium. Steak sauce. Canned and packaged gravies. Fish sauce. Oyster sauce. Cocktail sauce. Store-bought horseradish. Ketchup. Mustard. Meat flavorings and tenderizers. Bouillon cubes. Hot sauces. Pre-made or packaged marinades. Pre-made or packaged taco seasonings. Relishes. Regular salad dressings. Other foods Salted popcorn and pretzels. The items listed above may not be a complete list of foods and beverages you should avoid. Contact a dietitian for more information. Where to find more information National Heart, Lung, and Blood Institute: https://wilson-eaton.com/ American Heart Association: www.heart.org Academy of Nutrition and Dietetics: www.eatright.[place]: www.kidney.org Summary The DASH eating plan is a healthy eating plan that has been shown to reduce high blood pressure (hypertension). It may also reduce your risk for type 2 diabetes, heart disease, and stroke. When on the DASH eating plan, aim to eat more fresh fruits and  vegetables, whole grains, lean proteins, low-fat dairy, and heart-healthy fats. With the DASH eating plan, you should limit salt (sodium) intake to 2,300 mg a day. If you have hypertension, you may need to reduce your sodium intake to 1,500 mg a day. Work with your health care provider or  dietitian to adjust your eating plan to your individual calorie needs. This information is not intended to replace advice given to you by your health care provider. Make sure you discuss any questions you have with your health care provider. Document Revised: 06/15/2019 Document Reviewed: 06/15/2019 Elsevier Patient Education  [employer].

## 2022-09-06 NOTE — Assessment & Plan Note (Addendum)
Weaned off atenolol due to bradycardia Denies any palpitations or dizziness or syncope

## 2022-09-06 NOTE — Assessment & Plan Note (Addendum)
BP at goal with amlodipine and HCTZ BP Readings from Last 3 Encounters:  09/06/22 126/78  07/08/22 (!) 140/80  05/24/22 (!) 140/80    Maintain med doses

## 2022-09-06 NOTE — Assessment & Plan Note (Signed)
Unable to tolerate atorvastatin 1x/week and fenofibrate (myalgia and muscle weakness) She has upcoming appt with Dr. Debara Pickett in June 2024

## 2022-09-09 ENCOUNTER — Other Ambulatory Visit (HOSPITAL_COMMUNITY): Payer: Self-pay

## 2022-09-09 LAB — DRUG MONITORING, PANEL 8 WITH CONFIRMATION, URINE
6 Acetylmorphine: NEGATIVE ng/mL (ref <10)
Alcohol Metabolites: NEGATIVE ng/mL (ref <500)
Amphetamines: NEGATIVE ng/mL (ref <500)
Benzodiazepines: NEGATIVE ng/mL (ref <100)
Buprenorphine, Urine: NEGATIVE ng/mL (ref <5)
Cocaine Metabolite: NEGATIVE ng/mL (ref <150)
Codeine: NEGATIVE ng/mL (ref <50)
Creatinine: 162.4 mg/dL (ref >=20.0)
Hydrocodone: 2925 ng/mL — ABNORMAL HIGH (ref <50)
Hydromorphone: 1412 ng/mL — ABNORMAL HIGH (ref <50)
MDMA: NEGATIVE ng/mL (ref <500)
Marijuana Metabolite: NEGATIVE ng/mL (ref <20)
Morphine: NEGATIVE ng/mL (ref <50)
Norhydrocodone: 3845 ng/mL — ABNORMAL HIGH (ref <50)
Opiates: POSITIVE ng/mL — AB (ref <100)
Oxidant: NEGATIVE ug/mL (ref <200)
Oxycodone: NEGATIVE ng/mL (ref <100)
pH: 5.3 (ref 4.5–9.0)

## 2022-09-09 LAB — DM TEMPLATE

## 2022-09-09 NOTE — Progress Notes (Signed)
Stable Follow instructions as discussed during office visit.

## 2022-09-10 ENCOUNTER — Other Ambulatory Visit (HOSPITAL_COMMUNITY): Payer: Self-pay

## 2022-09-10 ENCOUNTER — Other Ambulatory Visit: Payer: Self-pay

## 2022-09-27 ENCOUNTER — Ambulatory Visit: Payer: Self-pay

## 2022-09-27 ENCOUNTER — Telehealth: Payer: BLUE CROSS/BLUE SHIELD

## 2022-09-27 DIAGNOSIS — G720 Drug-induced myopathy: Secondary | ICD-10-CM

## 2022-09-27 DIAGNOSIS — I10 Essential (primary) hypertension: Secondary | ICD-10-CM

## 2022-09-27 DIAGNOSIS — E785 Hyperlipidemia, unspecified: Secondary | ICD-10-CM

## 2022-09-27 DIAGNOSIS — E782 Mixed hyperlipidemia: Secondary | ICD-10-CM

## 2022-09-27 DIAGNOSIS — G8929 Other chronic pain: Secondary | ICD-10-CM

## 2022-09-27 NOTE — Patient Instructions (Addendum)
Please call the care guide team at [phone] if you need to cancel or reschedule your appointment.   If you are experiencing a Mental Health or [place] or need someone to talk to, please call the Suicide and Crisis Lifeline: 988 call the [place] National Suicide Prevention Lifeline: 971-835-0960 or TTY: 5486336747 TTY [phone]) to talk to a trained counselor call 1-800-273-TALK (toll free, 24 hour hotline)   Following is a copy of the CCM Program Consent:  CCM service includes personalized support from designated clinical staff supervised by the physician, including individualized plan of care and coordination with other care providers 24/7 contact phone numbers for assistance for urgent and routine care needs. Service will only be billed when office clinical staff spend 20 minutes or more in a month to coordinate care. Only one practitioner may furnish and bill the service in a calendar month. The patient may stop CCM services at amy time (effective at the end of the month) by phone call to the office staff. The patient will be responsible for cost sharing (co-pay) or up to 20% of the service fee (after annual deductible is met)  Following is a copy of your full provider care plan:   Goals Addressed             This Visit's Progress    CCM Expected Outcome:  Monitor, Self-Manage and Reduce Symptoms of: Chronic Pain       Current Barriers:  Chronic Disease Management support and education needs related to for effective management of Chronic Pain with long term Opioid use  Planned Interventions: Reviewed provider established plan for pain management. The patient has chronic pain, specifically with back pain and arthritis. The patient has long term use of Opioids and sees provider on a regular basis. States that it is hardly ever under a 6. The patient is doing stretches and she also says it is helping her pain level by not taking the statins. The statins are very  debilitating to her. Reflective listening and support given.  Discussed importance of adherence to all scheduled medical appointments. The next appointment with pcp is 12-06-2022 and she will see a specialist on 02-08-2023 at the [facility] for her HLD; [place] on the importance of reporting any/all new or changed pain symptoms or management strategies to pain management provider; Advised patient to report to care team affect of pain on daily activities. The patient states that if she did not have the pain medications she could not function. She is thankful that she has good support system with her providers. ; Discussed use of relaxation techniques and/or diversional activities to assist with pain reduction (distraction, imagery, relaxation, massage, acupressure, TENS, heat, and cold application. The patient uses heat application and cold application also. The patient states she has a system that works for her well. Education and support given; Reviewed with patient prescribed pharmacological and nonpharmacological pain relief strategies. Review and education provided; Advised patient to discuss changes in her level of pain or intensity of pain, or unresolved pain  with provider; Screening for signs and symptoms of depression related to chronic disease state;  Assessed social determinant of health barriers;  Review of Safety and falls prevention with the patient and making sure to be safe and mindful of her surroundings.  Symptom Management: Take medications as prescribed   Attend all scheduled provider appointments Call provider office for new concerns or questions  call the Suicide and Crisis Lifeline: 988 call the [place] National Suicide Prevention Lifeline:  343-430-2089 or TTY: 717-662-0884 TTY (731)878-9065) to talk to a trained counselor call 1-800-273-TALK (toll free, 24 hour hotline) if experiencing a Mental Health or [place]   Follow Up Plan: Telephone follow up  appointment with care management team member scheduled for: 12-13-2022 at 230 pm       CCM Expected Outcome:  Monitor, Self-Manage and Reduce Symptoms of:HLD       Current Barriers:  Knowledge Deficits related to medications to help with HLD and dietary help with maintaining healthy cholesterol levels Chronic Disease Management support and education needs related to effective management of HLD Lab Results  Component Value Date   CHOL 211 (H) 07/30/2022   HDL 35.90 (L) 07/30/2022   LDLCALC 143 (H) 02/05/2022   LDLDIRECT 128.0 07/30/2022   TRIG 293.0 (H) 07/30/2022   CHOLHDL 6 07/30/2022     Planned Interventions: Provider established cholesterol goals reviewed. Review of goals of cholesterol. The patient states she knows her levels are out of range. She states she just cannot take the statin's they make her feel terrible. She is changing her dietary habits and this is helping she feels. She is going to go to a Lipid Clinic in July and she is eager to find out about this and how it can help her. Education and support given; Counseled on importance of regular laboratory monitoring as prescribed. Review of having regular lab work to check levels; Provided HLD educational materials. Sending information on HLD and planning health meals in the mail to the patient. ; Reviewed role and benefits of statin for ASCVD risk reduction. The patient tried the Lipitor and the fenofibrate and both of these made her feel terrible. She told the pcp that she could not tolerate this. She is making dietary changes. She also states that she is going to a Lipid clinic due to her high cholesterol but the earliest they can see her is July.  Discussed strategies to manage statin-induced myalgias. The patient has tried different statins but still has issues with myalgias and pain. She is wanting to see what other options there are. Education and support given.; Reviewed importance of limiting foods high in cholesterol.  Review and education will send information by My Chart for the patient ; Screening for signs and symptoms of depression related to chronic disease state;  Assessed social determinant of health barriers;   Symptom Management: Take medications as prescribed   Attend all scheduled provider appointments Call provider office for new concerns or questions  call the Suicide and Crisis Lifeline: 988 call the [place] National Suicide Prevention Lifeline: 8580363660 or TTY: 616-794-3510 TTY 615-720-2781) to talk to a trained counselor call 1-800-273-TALK (toll free, 24 hour hotline) if experiencing a Mental Health or [place]  - call for medicine refill 2 or 3 days before it runs out - take all medications exactly as prescribed - call doctor with any symptoms you believe are related to your medicine - call doctor when you experience any new symptoms - go to all doctor appointments as scheduled - adhere to prescribed diet: Heart healthy diet   Follow Up Plan: Telephone follow up appointment with care management team member scheduled for: 12-13-2022 at 230 pm       CCM Expected Outcome:  Monitor, Self-Manage, and Reduce Symptoms of Hypertension       Current Barriers:  Chronic Disease Management support and education needs related to effective management of HTN BP Readings from Last 3 Encounters:  09/06/22 126/78  07/08/22 Marland Kitchen)  140/80  05/24/22 (!) 140/80     Planned Interventions: Evaluation of current treatment plan related to hypertension self management and patient's adherence to plan as established by provider. The patient states her blood pressures are more stable. The patient has been taking at home. Education on the benefits of continuation of monitoring of blood pressures and calling the office for abnormal readings. ;   Provided education to patient re: stroke prevention, s/s of heart attack and stroke; Reviewed prescribed diet heart healthy diet. The patient states she  has made changes in her eating habits and trying to stay away from sweets. The patient states she is really focusing on dietary changes to help with controlling her blood pressures and cholesterol levels. Will send educational information in the mail to the patient on planning healthy meals. Reviewed medications with patient and discussed importance of compliance. The patient is compliant with medications. States no new concerns at this time with medications ;  Discussed plans with patient for ongoing care management follow up and provided patient with direct contact information for care management team; Advised patient, providing education and rationale, to monitor blood pressure daily and record, calling PCP for findings outside established parameters. She is taking her blood pressures consistently at home and states that her blood pressure day before yesterday was 128/78.  Reviewed scheduled/upcoming provider appointments including: sees pcp on a regular basis, last time was 09-06-2022, next appointment is 12-06-2022 at 10 am. Just had lab work will follow up in a month with pcp Advised patient to discuss changes in her blood pressures or heart health with provider; Provided education on prescribed diet heart healthy diet ;  Discussed complications of poorly controlled blood pressure such as heart disease, stroke, circulatory complications, vision complications, kidney impairment, sexual dysfunction;  Screening for signs and symptoms of depression related to chronic disease state;  Assessed social determinant of health barriers;   Symptom Management: Take medications as prescribed   Attend all scheduled provider appointments Call provider office for new concerns or questions  call the Suicide and Crisis Lifeline: 988 call the [place] National Suicide Prevention Lifeline: 320-853-3480 or TTY: 509 411 9163 TTY 330-386-5947) to talk to a trained counselor call 1-800-273-TALK (toll free, 24 hour  hotline) if experiencing a Mental Health or [place]  check blood pressure 3 times per week learn about high blood pressure keep a blood pressure log take blood pressure log to all doctor appointments call doctor for signs and symptoms of high blood pressure develop an action plan for high blood pressure keep all doctor appointments take medications for blood pressure exactly as prescribed report new symptoms to your doctor  Follow Up Plan: Telephone follow up appointment with care management team member scheduled for: 12-13-2022 at 230 pm          The patient verbalized understanding of instructions, educational materials, and care plan provided today and agreed to receive a mailed copy of patient instructions, educational materials, and care plan.   Telephone follow up appointment with care management team member scheduled for: 12-13-2022 at 230 pm  Exercise Information for Aging Adults Staying physically active is important as you age. Physical activity and exercise can help in maintaining quality of life, health, physical function, and reducing falls. The four types of exercises that are best for older adults are endurance, strength, balance, and flexibility. Contact your health care provider before you start any exercise routine. Ask your health care provider what activities are safe for you. What are the  risks? Risks associated with exercising include: Overdoing it. This may lead to sore muscles or fatigue. Falls. Injuries. Dehydration. How to do these exercises Endurance exercises Endurance (aerobic) exercises raise your breathing rate and heart rate. Increasing your endurance helps you do everyday tasks and stay healthy. By improving the health of your body system that includes your heart, lungs, and blood vessels (circulatory system), you may also delay or prevent diseases such as heart disease, diabetes, and weak bones (osteoporosis). Types of endurance  exercises include: Sports. Indoor activities, such as using gym equipment, doing water aerobics, or dancing. Outdoor activities, such as biking or jogging. Tasks around the house, such as gardening, yard work, and heavy household chores like cleaning. Walking, such as hiking or walking around your neighborhood. When doing endurance exercises, make sure you: Are aware of your surroundings. Use safety equipment as directed. Dress in layers when exercising outdoors. Drink plenty of water to stay well hydrated. Build up endurance slowly. Start with 10 minutes at a time, and gradually build up to doing 30 minutes at a time. Unless your health care provider gave you different instructions, aim to exercise for a total of 150 minutes a week. Spread out that time so you are working on endurance 3 or more days a week. Strength exercises Lifting, pulling, or pushing weights helps to strengthen muscles. Having stronger muscles makes it easier to do everyday activities, such as getting up from a chair, climbing stairs, carrying groceries, and playing with grandchildren. Strength exercises include arm and leg exercises that may be done: With weights. Without weights (using your own body weight). With a resistance band. When doing strength exercises: Move smoothly and steadily. Do not suddenly thrust or jerk the weights, the resistance band, or your body. Start with no weights or with light weights, and gradually add more weight over time. Eventually, aim to use weights that are hard or very hard for you to lift. This means that you are able to do 8 repetitions with the weight, and the last few repetitions are very challenging. Lift or push weights into position for 3 seconds, hold the position for 1 second, and then take 3 seconds to return to your starting position. Breathe out (exhale) during difficult movements, like lifting or pushing weights. Breathe in (inhale) to relax your muscles before the next  repetition. Consider alternating arms or legs, especially when you first start strength exercises. Expect some slight muscle soreness after each session. Do strength exercises on 2 or more days a week, for 30 minutes at a time. Avoid exercising the same muscle groups two days in a row. For example, if you work on your leg muscles one day, work on your arm muscles the next day. When you can do two sets of 10-15 repetitions with a certain weight, increase the amount of weight. Balance exercises Balance exercises can help to prevent falls. Balance exercises include: Standing on one foot. Heel-to-toe walk. Balance walk. Tai chi. Make sure you have something sturdy to hold onto while doing balance exercises, such as a sturdy chair. As your balance improves, challenge yourself by holding on to the chair with one hand instead of two, and then with no hands. Trying exercises with your eyes closed also challenges your balance, but be sure to have a sturdy surface (like a countertop) close by in case you need it. Do balance exercises as often as you want, or as often as directed by your health care provider. Flexibility exercises  Flexibility exercises  improve how far you can bend, straighten, move, or rotate parts of your body (range of motion). These exercises also help you do everyday activities such as getting dressed or reaching for objects. Flexibility exercises include stretching different parts of the body, and they may be done in a standing or seated position or on the floor. When stretching, make sure you: Keep a slight bend in your arms and legs. Avoid completely straightening ("locking") your joints. Do not stretch so far that you feel pain. You should feel a mild stretching feeling. You may try stretching farther as you become more flexible over time. Relax and breathe between stretches. Hold on to something sturdy for balance as needed. Hold each stretch for 10-30 seconds. Repeat each stretch  3-5 times. General safety tips Exercise in well-lit areas. Do not hold your breath during exercises or stretches. Warm up before exercising, and cool down after exercising. This can help prevent injury. Drink plenty of water during exercise or any activity that makes you sweat. If you are not sure if an exercise is safe for you, or you are not sure how to do an exercise, talk with your health care provider. This is especially important if you have had surgery on muscles, bones, or joints (orthopedic surgery). Where to find more information You can find more information about exercise for older adults from: Your local health department, fitness center, or community center. These facilities may have programs for aging adults. [employer] on Aging: http://kim-miller.com/ National Council on Aging: www.ncoa.org Summary Staying physically active is important as you age. Doing endurance, strength, balance, and flexibility exercises can help in maintaining quality of life, health, physical function, and reducing falls. Make sure to contact your health care provider before you start any exercise routine. Ask your health care provider what activities are safe for you. This information is not intended to replace advice given to you by your health care provider. Make sure you discuss any questions you have with your health care provider. Document Revised: 11/24/2020 Document Reviewed: 11/24/2020 Elsevier Patient Education  [place] Eating a healthy diet is important for the health of your heart. A heart-healthy eating plan includes: Eating less unhealthy fats. Eating more healthy fats. Eating less salt in your food. Salt is also called sodium. Making other changes in your diet. Talk with your doctor or a diet specialist (dietitian) to create an eating plan that is right for you. What is my plan? Your doctor may recommend an eating plan that includes: Total fat:  ______% or less of total calories a day. Saturated fat: ______% or less of total calories a day. Cholesterol: less than _________mg a day. Sodium: less than _________mg a day. What are tips for following this plan? Cooking Avoid frying your food. Try to bake, boil, grill, or broil it instead. You can also reduce fat by: Removing the skin from poultry. Removing all visible fats from meats. Steaming vegetables in water or broth. Meal planning  At meals, divide your plate into four equal parts: Fill one-half of your plate with vegetables and green salads. Fill one-fourth of your plate with whole grains. Fill one-fourth of your plate with lean protein foods. Eat 2-4 cups of vegetables per day. One cup of vegetables is: 1 cup (91 g) broccoli or cauliflower florets. 2 medium carrots. 1 large bell pepper. 1 large sweet potato. 1 large tomato. 1 medium white potato. 2 cups (150 g) raw leafy greens. Eat 1-2 cups of fruit  per day. One cup of fruit is: 1 small apple 1 large banana 1 cup (237 g) mixed fruit, 1 large orange,  cup (82 g) dried fruit, 1 cup (240 mL) 100% fruit juice. Eat more foods that have soluble fiber. These are apples, broccoli, carrots, beans, peas, and barley. Try to get 20-30 g of fiber per day. Eat 4-5 servings of nuts, legumes, and seeds per week: 1 serving of dried beans or legumes equals  cup (90 g) cooked. 1 serving of nuts is  oz (12 almonds, 24 pistachios, or 7 walnut halves). 1 serving of seeds equals  oz (8 g). General information Eat more home-cooked food. Eat less restaurant, buffet, and fast food. Limit or avoid alcohol. Limit foods that are high in starch and sugar. Avoid fried foods. Lose weight if you are overweight. Keep track of how much salt (sodium) you eat. This is important if you have high blood pressure. Ask your doctor to tell you more about this. Try to add vegetarian meals each week. Fats Choose healthy fats. These include olive  oil and canola oil, flaxseeds, walnuts, almonds, and seeds. Eat more omega-3 fats. These include salmon, mackerel, sardines, tuna, flaxseed oil, and ground flaxseeds. Try to eat fish at least 2 times each week. Check food labels. Avoid foods with trans fats or high amounts of saturated fat. Limit saturated fats. These are often found in animal products, such as meats, butter, and cream. These are also found in plant foods, such as palm oil, palm kernel oil, and coconut oil. Avoid foods with partially hydrogenated oils in them. These have trans fats. Examples are stick margarine, some tub margarines, cookies, crackers, and other baked goods. What foods should I eat? Fruits All fresh, canned (in natural juice), or frozen fruits. Vegetables Fresh or frozen vegetables (raw, steamed, roasted, or grilled). Green salads. Grains Most grains. Choose whole wheat and whole grains most of the time. Rice and pasta, including brown rice and pastas made with whole wheat. Meats and other proteins Lean, well-trimmed beef, veal, pork, and lamb. Chicken and [place] without skin. All fish and shellfish. Wild duck, rabbit, pheasant, and venison. Egg whites or low-cholesterol egg substitutes. Dried beans, peas, lentils, and tofu. Seeds and most nuts. Dairy Low-fat or nonfat cheeses, including ricotta and mozzarella. Skim or 1% milk that is liquid, powdered, or evaporated. Buttermilk that is made with low-fat milk. Nonfat or low-fat yogurt. Fats and oils Non-hydrogenated (trans-free) margarines. Vegetable oils, including soybean, sesame, sunflower, olive, peanut, safflower, corn, canola, and cottonseed. Salad dressings or mayonnaise made with a vegetable oil. Beverages Mineral water. Coffee and tea. Diet carbonated beverages. Sweets and desserts Sherbet, gelatin, and fruit ice. Small amounts of dark chocolate. Limit all sweets and desserts. Seasonings and condiments All seasonings and condiments. The items listed  above may not be a complete list of foods and drinks you can eat. Contact a dietitian for more options. What foods should I avoid? Fruits Canned fruit in heavy syrup. Fruit in cream or butter sauce. Fried fruit. Limit coconut. Vegetables Vegetables cooked in cheese, cream, or butter sauce. Fried vegetables. Grains Breads that are made with saturated or trans fats, oils, or whole milk. Croissants. Sweet rolls. Donuts. High-fat crackers, such as cheese crackers. Meats and other proteins Fatty meats, such as hot dogs, ribs, sausage, bacon, rib-eye roast or steak. High-fat deli meats, such as salami and bologna. Caviar. Domestic duck and goose. Organ meats, such as liver. Dairy Cream, sour cream, cream cheese, and creamed cottage  cheese. Whole-milk cheeses. Whole or 2% milk that is liquid, evaporated, or condensed. Whole buttermilk. Cream sauce or high-fat cheese sauce. Yogurt that is made from whole milk. Fats and oils Meat fat, or shortening. Cocoa butter, hydrogenated oils, palm oil, coconut oil, palm kernel oil. Solid fats and shortenings, including bacon fat, salt pork, lard, and butter. Nondairy cream substitutes. Salad dressings with cheese or sour cream. Beverages Regular sodas and juice drinks with added sugar. Sweets and desserts Frosting. Pudding. Cookies. Cakes. Pies. Milk chocolate or white chocolate. Buttered syrups. Full-fat ice cream or ice cream drinks. The items listed above may not be a complete list of foods and drinks to avoid. Contact a dietitian for more information. Summary Heart-healthy meal planning includes eating less unhealthy fats, eating more healthy fats, and making other changes in your diet. Eat a balanced diet. This includes fruits and vegetables, low-fat or nonfat dairy, lean protein, nuts and legumes, whole grains, and heart-healthy oils and fats. This information is not intended to replace advice given to you by your health care provider. Make sure you discuss  any questions you have with your health care provider. Document Revised: 08/17/2021 Document Reviewed: 08/17/2021 Elsevier Patient Education  [place]. High Cholesterol  High cholesterol is a condition in which the blood has high levels of a white, waxy substance similar to fat (cholesterol). The liver makes all the cholesterol that the body needs. The human body needs small amounts of cholesterol to help build cells. A person gets extra or excess cholesterol from the food that he or she eats. The blood carries cholesterol from the liver to the rest of the body. If you have high cholesterol, deposits (plaques) may build up on the walls of your arteries. Arteries are the blood vessels that carry blood away from your heart. These plaques make the arteries narrow and stiff. Cholesterol plaques increase your risk for heart attack and stroke. Work with your health care provider to keep your cholesterol levels in a healthy range. What increases the risk? The following factors may make you more likely to develop this condition: Eating foods that are high in animal fat (saturated fat) or cholesterol. Being overweight. Not getting enough exercise. A family history of high cholesterol (familial hypercholesterolemia). Use of tobacco products. Having diabetes. What are the signs or symptoms? In most cases, high cholesterol does not usually cause any symptoms. In severe cases, very high cholesterol levels can cause: Fatty bumps under the skin (xanthomas). A white or gray ring around the black center (pupil) of the eye. How is this diagnosed? This condition may be diagnosed based on the results of a blood test. If you are older than 80 years of age, your health care provider may check your cholesterol levels every 4-6 years. You may be checked more often if you have high cholesterol or other risk factors for heart disease. The blood test for cholesterol measures: "Bad" cholesterol, or LDL  cholesterol. This is the main type of cholesterol that causes heart disease. The desired level is less than 100 mg/dL (2.59 mmol/L). "Good" cholesterol, or HDL cholesterol. HDL helps protect against heart disease by cleaning the arteries and carrying the LDL to the liver for processing. The desired level for HDL is 60 mg/dL (1.55 mmol/L) or higher. Triglycerides. These are fats that your body can store or burn for energy. The desired level is less than 150 mg/dL (1.69 mmol/L). Total cholesterol. This measures the total amount of cholesterol in your blood and includes LDL,  HDL, and triglycerides. The desired level is less than 200 mg/dL (5.17 mmol/L). How is this treated? Treatment for high cholesterol starts with lifestyle changes, such as diet and exercise. Diet changes. You may be asked to eat foods that have more fiber and less saturated fats or added sugar. Lifestyle changes. These may include regular exercise, maintaining a healthy weight, and quitting use of tobacco products. Medicines. These are given when diet and lifestyle changes have not worked. You may be prescribed a statin medicine to help lower your cholesterol levels. Follow these instructions at home: Eating and drinking  Eat a healthy, balanced diet. This diet includes: Daily servings of a variety of fresh, frozen, or canned fruits and vegetables. Daily servings of whole grain foods that are rich in fiber. Foods that are low in saturated fats and trans fats. These include poultry and fish without skin, lean cuts of meat, and low-fat dairy products. A variety of fish, especially oily fish that contain omega-3 fatty acids. Aim to eat fish at least 2 times a week. Avoid foods and drinks that have added sugar. Use healthy cooking methods, such as roasting, grilling, broiling, baking, poaching, steaming, and stir-frying. Do not fry your food except for stir-frying. If you drink alcohol: Limit how much you have to: 0-1 drink a day  for women who are not pregnant. 0-2 drinks a day for men. Know how much alcohol is in a drink. In the [place] one drink equals one 12 oz bottle of beer (355 mL), one 5 oz glass of wine (148 mL), or one 1 oz glass of hard liquor (44 mL). Lifestyle  Get regular exercise. Aim to exercise for a total of 150 minutes a week. Increase your activity level by doing activities such as gardening, walking, and taking the stairs. Do not use any products that contain nicotine or tobacco. These products include cigarettes, chewing tobacco, and vaping devices, such as e-cigarettes. If you need help quitting, ask your health care provider. General instructions Take over-the-counter and prescription medicines only as told by your health care provider. Keep all follow-up visits. This is important. Where to find more information American Heart Association: www.heart.org National Heart, Lung, and Blood Institute: https://wilson-eaton.com/ Contact a health care provider if: You have trouble achieving or maintaining a healthy diet or weight. You are starting an exercise program. You are unable to stop smoking. Get help right away if: You have chest pain. You have trouble breathing. You have discomfort or pain in your jaw, neck, back, shoulder, or arm. You have any symptoms of a stroke. "BE FAST" is an easy way to remember the main warning signs of a stroke: B - Balance. Signs are dizziness, sudden trouble walking, or loss of balance. E - Eyes. Signs are trouble seeing or a sudden change in vision. F - Face. Signs are sudden weakness or numbness of the face, or the face or eyelid drooping on one side. A - Arms. Signs are weakness or numbness in an arm. This happens suddenly and usually on one side of the body. S - Speech. Signs are sudden trouble speaking, slurred speech, or trouble understanding what people say. T - Time. Time to call emergency services. Write down what time symptoms started. You have other signs of a  stroke, such as: A sudden, severe headache with no known cause. Nausea or vomiting. Seizure. These symptoms may represent a serious problem that is an emergency. Do not wait to see if the symptoms will go away. Get medical  help right away. Call your local emergency services (911 in the [place] Do not drive yourself to the hospital. Summary Cholesterol plaques increase your risk for heart attack and stroke. Work with your health care provider to keep your cholesterol levels in a healthy range. Eat a healthy, balanced diet, get regular exercise, and maintain a healthy weight. Do not use any products that contain nicotine or tobacco. These products include cigarettes, chewing tobacco, and vaping devices, such as e-cigarettes. Get help right away if you have any symptoms of a stroke. This information is not intended to replace advice given to you by your health care provider. Make sure you discuss any questions you have with your health care provider. Document Revised: 02/12/2022 Document Reviewed: 09/15/2020 Elsevier Patient Education  [place].

## 2022-09-27 NOTE — Chronic Care Management (AMB) (Signed)
Chronic Care Management   CCM RN Visit Note  09/27/2022 Name: Heather Shaw MRN: [id number] DOB: 04-Jul-1941  Subjective: Heather Shaw is a 82 y.o. year old female who is a primary care patient of [name], Charlene Brooke, NP. The patient was referred to the Chronic Care Management team for assistance with care management needs subsequent to provider initiation of CCM services and plan of care.    Today's Visit:  Engaged with patient by telephone for follow up visit.     SDOH Interventions Today    Flowsheet Row Most Recent Value  SDOH Interventions   Physical Activity Interventions Other (Comments)  [is getting outside and doing gardening, doing stretches]         Goals Addressed             This Visit's Progress    CCM Expected Outcome:  Monitor, Self-Manage and Reduce Symptoms of: Chronic Pain       Current Barriers:  Chronic Disease Management support and education needs related to for effective management of Chronic Pain with long term Opioid use  Planned Interventions: Reviewed provider established plan for pain management. The patient has chronic pain, specifically with back pain and arthritis. The patient has long term use of Opioids and sees provider on a regular basis. States that it is hardly ever under a 6. The patient is doing stretches and she also says it is helping her pain level by not taking the statins. The statins are very debilitating to her. Reflective listening and support given.  Discussed importance of adherence to all scheduled medical appointments. The next appointment with pcp is 12-06-2022 and she will see a specialist on 02-08-2023 at the [facility] for her HLD; [place] on the importance of reporting any/all new or changed pain symptoms or management strategies to pain management provider; Advised patient to report to care team affect of pain on daily activities. The patient states that if she did not have the pain medications she could not  function. She is thankful that she has good support system with her providers. ; Discussed use of relaxation techniques and/or diversional activities to assist with pain reduction (distraction, imagery, relaxation, massage, acupressure, TENS, heat, and cold application. The patient uses heat application and cold application also. The patient states she has a system that works for her well. Education and support given; Reviewed with patient prescribed pharmacological and nonpharmacological pain relief strategies. Review and education provided; Advised patient to discuss changes in her level of pain or intensity of pain, or unresolved pain  with provider; Screening for signs and symptoms of depression related to chronic disease state;  Assessed social determinant of health barriers;  Review of Safety and falls prevention with the patient and making sure to be safe and mindful of her surroundings.  Symptom Management: Take medications as prescribed   Attend all scheduled provider appointments Call provider office for new concerns or questions  call the Suicide and Crisis Lifeline: 988 call the [place] National Suicide Prevention Lifeline: (506)232-2093 or TTY: 513-843-0632 TTY 518-118-9720) to talk to a trained counselor call 1-800-273-TALK (toll free, 24 hour hotline) if experiencing a Mental Health or [place]   Follow Up Plan: Telephone follow up appointment with care management team member scheduled for: 12-13-2022 at 230 pm       CCM Expected Outcome:  Monitor, Self-Manage and Reduce Symptoms of:HLD       Current Barriers:  Knowledge Deficits related to medications to help with HLD and  dietary help with maintaining healthy cholesterol levels Chronic Disease Management support and education needs related to effective management of HLD Lab Results  Component Value Date   CHOL 211 (H) 07/30/2022   HDL 35.90 (L) 07/30/2022   LDLCALC 143 (H) 02/05/2022   LDLDIRECT 128.0  07/30/2022   TRIG 293.0 (H) 07/30/2022   CHOLHDL 6 07/30/2022     Planned Interventions: Provider established cholesterol goals reviewed. Review of goals of cholesterol. The patient states she knows her levels are out of range. She states she just cannot take the statin's they make her feel terrible. She is changing her dietary habits and this is helping she feels. She is going to go to a Lipid Clinic in July and she is eager to find out about this and how it can help her. Education and support given; Counseled on importance of regular laboratory monitoring as prescribed. Review of having regular lab work to check levels; Provided HLD educational materials. Sending information on HLD and planning health meals in the mail to the patient. ; Reviewed role and benefits of statin for ASCVD risk reduction. The patient tried the Lipitor and the fenofibrate and both of these made her feel terrible. She told the pcp that she could not tolerate this. She is making dietary changes. She also states that she is going to a Lipid clinic due to her high cholesterol but the earliest they can see her is July.  Discussed strategies to manage statin-induced myalgias. The patient has tried different statins but still has issues with myalgias and pain. She is wanting to see what other options there are. Education and support given.; Reviewed importance of limiting foods high in cholesterol. Review and education will send information by My Chart for the patient ; Screening for signs and symptoms of depression related to chronic disease state;  Assessed social determinant of health barriers;   Symptom Management: Take medications as prescribed   Attend all scheduled provider appointments Call provider office for new concerns or questions  call the Suicide and Crisis Lifeline: 988 call the [place] National Suicide Prevention Lifeline: 445-510-4966 or TTY: (820)691-4867 TTY (548)751-1255) to talk to a trained  counselor call 1-800-273-TALK (toll free, 24 hour hotline) if experiencing a Mental Health or [place]  - call for medicine refill 2 or 3 days before it runs out - take all medications exactly as prescribed - call doctor with any symptoms you believe are related to your medicine - call doctor when you experience any new symptoms - go to all doctor appointments as scheduled - adhere to prescribed diet: Heart healthy diet   Follow Up Plan: Telephone follow up appointment with care management team member scheduled for: 12-13-2022 at 230 pm       CCM Expected Outcome:  Monitor, Self-Manage, and Reduce Symptoms of Hypertension       Current Barriers:  Chronic Disease Management support and education needs related to effective management of HTN BP Readings from Last 3 Encounters:  09/06/22 126/78  07/08/22 (!) 140/80  05/24/22 (!) 140/80     Planned Interventions: Evaluation of current treatment plan related to hypertension self management and patient's adherence to plan as established by provider. The patient states her blood pressures are more stable. The patient has been taking at home. Education on the benefits of continuation of monitoring of blood pressures and calling the office for abnormal readings. ;   Provided education to patient re: stroke prevention, s/s of heart attack and stroke; Reviewed prescribed  diet heart healthy diet. The patient states she has made changes in her eating habits and trying to stay away from sweets. The patient states she is really focusing on dietary changes to help with controlling her blood pressures and cholesterol levels. Will send educational information in the mail to the patient on planning healthy meals. Reviewed medications with patient and discussed importance of compliance. The patient is compliant with medications. States no new concerns at this time with medications ;  Discussed plans with patient for ongoing care management follow  up and provided patient with direct contact information for care management team; Advised patient, providing education and rationale, to monitor blood pressure daily and record, calling PCP for findings outside established parameters. She is taking her blood pressures consistently at home and states that her blood pressure day before yesterday was 128/78.  Reviewed scheduled/upcoming provider appointments including: sees pcp on a regular basis, last time was 09-06-2022, next appointment is 12-06-2022 at 10 am. Just had lab work will follow up in a month with pcp Advised patient to discuss changes in her blood pressures or heart health with provider; Provided education on prescribed diet heart healthy diet ;  Discussed complications of poorly controlled blood pressure such as heart disease, stroke, circulatory complications, vision complications, kidney impairment, sexual dysfunction;  Screening for signs and symptoms of depression related to chronic disease state;  Assessed social determinant of health barriers;   Symptom Management: Take medications as prescribed   Attend all scheduled provider appointments Call provider office for new concerns or questions  call the Suicide and Crisis Lifeline: 988 call the [place] National Suicide Prevention Lifeline: 279-877-7551 or TTY: (508) 505-8238 TTY 716-163-2565) to talk to a trained counselor call 1-800-273-TALK (toll free, 24 hour hotline) if experiencing a Mental Health or [place]  check blood pressure 3 times per week learn about high blood pressure keep a blood pressure log take blood pressure log to all doctor appointments call doctor for signs and symptoms of high blood pressure develop an action plan for high blood pressure keep all doctor appointments take medications for blood pressure exactly as prescribed report new symptoms to your doctor  Follow Up Plan: Telephone follow up appointment with care management team member  scheduled for: 12-13-2022 at 230 pm          Plan:Telephone follow up appointment with care management team member scheduled for:  12-13-2022 at 230 pm  [place], MSN, [place] Management Direct Number: [phone]

## 2022-09-28 ENCOUNTER — Telehealth: Payer: BLUE CROSS/BLUE SHIELD

## 2022-09-28 ENCOUNTER — Other Ambulatory Visit: Payer: Self-pay

## 2022-10-06 ENCOUNTER — Telehealth: Payer: Self-pay | Admitting: Nurse Practitioner

## 2022-10-06 DIAGNOSIS — M5137 Other intervertebral disc degeneration, lumbosacral region: Secondary | ICD-10-CM

## 2022-10-06 DIAGNOSIS — F1120 Opioid dependence, uncomplicated: Secondary | ICD-10-CM

## 2022-10-06 DIAGNOSIS — G8929 Other chronic pain: Secondary | ICD-10-CM

## 2022-10-06 DIAGNOSIS — M4807 Spinal stenosis, lumbosacral region: Secondary | ICD-10-CM

## 2022-10-06 NOTE — Telephone Encounter (Signed)
Prescription Request  10/06/2022  LOV: 09/06/2022  What is the name of the medication or equipment? HYDROcodone-acetaminophen (NORCO) 7.5-325 MG tablet   Have you contacted your pharmacy to request a refill? No controlled medication  Which pharmacy would you like this sent to?  [facility] [address] [place] [address] Phone: [phone] Fax: [phone]    Patient notified that their request is being sent to the clinical staff for review and that they should receive a response within 2 business days.   Please advise at Mobile [phone] (mobile)

## 2022-10-07 ENCOUNTER — Other Ambulatory Visit (HOSPITAL_COMMUNITY): Payer: Self-pay

## 2022-10-07 MED ORDER — HYDROCODONE-ACETAMINOPHEN 7.5-325 MG PO TABS
1.0000 | ORAL_TABLET | Freq: Three times a day (TID) | ORAL | 0 refills | Status: DC
  Filled 2022-10-08: qty 90, 30d supply, fill #0

## 2022-10-08 ENCOUNTER — Other Ambulatory Visit: Payer: Self-pay

## 2022-10-08 ENCOUNTER — Other Ambulatory Visit (HOSPITAL_COMMUNITY): Payer: Self-pay

## 2022-10-21 ENCOUNTER — Telehealth: Payer: Self-pay | Admitting: Nurse Practitioner

## 2022-10-21 NOTE — Telephone Encounter (Signed)
Contacted Heather Shaw to schedule their annual wellness visit. Appointment made for 10/29/22. [facility] [employer] [facility] direct phone # [phone]

## 2022-10-24 DIAGNOSIS — E785 Hyperlipidemia, unspecified: Secondary | ICD-10-CM

## 2022-10-24 DIAGNOSIS — I10 Essential (primary) hypertension: Secondary | ICD-10-CM | POA: Diagnosis not present

## 2022-10-29 ENCOUNTER — Ambulatory Visit (INDEPENDENT_AMBULATORY_CARE_PROVIDER_SITE_OTHER): Payer: Medicare Other

## 2022-10-29 VITALS — Ht 65.0 in | Wt 198.0 lb

## 2022-10-29 DIAGNOSIS — Z0000 Encounter for general adult medical examination without abnormal findings: Secondary | ICD-10-CM

## 2022-10-29 NOTE — Progress Notes (Signed)
I connected with  Heather Shaw on 10/29/22 by a audio enabled telemedicine application and verified that I am speaking with the correct person using two identifiers.  Patient Location: Home  Provider Location: Office/Clinic  I discussed the limitations of evaluation and management by telemedicine. The patient expressed understanding and agreed to proceed.  Subjective:   Heather Shaw is a 82 y.o. female who presents for Medicare Annual (Subsequent) preventive examination.  Review of Systems     Cardiac Risk Factors include: advanced age (>30men, >75 women);dyslipidemia;hypertension;obesity (BMI >30kg/m2)     Objective:    Today's Vitals   10/29/22 1456 10/29/22 1457  Weight: 198 lb (89.8 kg)   Height: 5\' 5"  (1.651 m)   PainSc:  6    Body mass index is 32.95 kg/m.     10/29/2022    3:00 PM 11/04/2021    1:05 PM 10/14/2021    2:28 PM 04/01/2021    7:28 PM 09/30/2020    1:34 PM 09/26/2019    2:39 PM 11/16/2017    1:14 PM  Advanced Directives  Does Patient Have a Medical Advance Directive? No No No No No No No  Would patient like information on creating a medical advance directive?  No - Patient declined No - Patient declined  Yes (MAU/Ambulatory/Procedural Areas - Information given) No - Patient declined No - Patient declined    Current Medications (verified) Outpatient Encounter Medications as of 10/29/2022  Medication Sig   amLODipine (NORVASC) 10 MG tablet Take 1 tablet (10 mg total) by mouth daily.   Calcium Glycerophosphate (PRELIEF PO) Take 2 capsules by mouth daily as needed (uses when eating acitic foods).   fluocinonide-emollient (LIDEX-E) 0.05 % cream Apply 1 application topically 2 (two) times daily.   gabapentin (NEURONTIN) 100 MG capsule Take 2 capsules (200 mg total) by mouth at bedtime.   hydrochlorothiazide (HYDRODIURIL) 25 MG tablet Take 1 tablet (25 mg total) by mouth daily.   HYDROcodone-acetaminophen (NORCO) 7.5-325 MG tablet Take 1 tablet by mouth  every 8 (eight) hours.   meclizine (ANTIVERT) 25 MG tablet Take 25 mg by mouth 3 (three) times daily as needed for dizziness.   Vitamin D, Ergocalciferol, (DRISDOL) 1.25 MG (50000 UNIT) CAPS capsule Take 1 capsule (50,000 Units total) by mouth every 7 (seven) days.   No facility-administered encounter medications on file as of 10/29/2022.    Allergies (verified) Contrast media [iodinated contrast media], Lisinopril, Allegra [fexofenadine], Codeine, Losartan, Nsaids, Prednisone, Pseudoephedrine hcl, Zithromax [azithromycin], and Sulfa antibiotics   History: Past Medical History:  Diagnosis Date   Arthritis    Bladder pain    Bradycardia    DDD (degenerative disc disease), lumbosacral    Grief 05/24/2018   Hyperlipidemia    Hyperplastic colon polyp    Hypertension    IC (interstitial cystitis)    Insomnia 04/06/2010   Qualifier: Diagnosis of  By: Dayton Martes MD, Talia     Internal hemorrhoids    Polyp of rectum    PONV (postoperative nausea and vomiting)    Right wrist tendonitis 08/24/2017   Squamous papilloma    in esophageal polyp   Uterine fibroid    Vertigo    Wears glasses    Wears partial dentures    Past Surgical History:  Procedure Laterality Date   CARPAL TUNNEL RELEASE Bilateral    COLONOSCOPY W/ POLYPECTOMY  07/27/2007   CYSTO WITH HYDRODISTENSION N/A 05/20/2014   Procedure: CYSTOSCOPY/HYDRODISTENSION, INSTILLATION OF CHLORPACTIN;  Surgeon: Valetta Fuller, MD;  Location:  [facility];  Service: Urology;  Laterality: N/A;   CYSTO/  HYDRODISTENTION/  INSTILLATION CLORPACTIC  09/21/2010   DILATION AND CURETTAGE OF UTERUS  07/26/1986   KNEE ARTHROSCOPY Right 11/18/2009   TRIGGER FINGER RELEASE Right 08/23/2016   Procedure: RIGHT RING FINGER TRIGGER RELEASE;  Surgeon: Mack Hookavid Thompson, MD;  Location: [facility];  Service: Orthopedics;  Laterality: Right;   Family History  Problem Relation Age of Onset   Clotting disorder Mother 4642   COPD Father     Heart attack Father 1360   Emphysema Father    Stroke Paternal Aunt    Hypertension Sister    Hypertension Brother    Social History   Socioeconomic History   Marital status: Widowed    Spouse name: Not on file   Number of children: 6   Years of education: Not on file   Highest education level: Not on file  Occupational History   Occupation: housewife  Tobacco Use   Smoking status: Former    Types: Cigarettes    Quit date: 05/17/1971    Years since quitting: 51.4   Smokeless tobacco: Never  Vaping Use   Vaping Use: Never used  Substance and Sexual Activity   Alcohol use: No    Alcohol/week: 0.0 standard drinks of alcohol   Drug use: No   Sexual activity: Never  Other Topics Concern   Not on file  Social History Narrative   Not on file   Social Determinants of Health   Financial Resource Strain: Low Risk  (10/29/2022)   Overall Financial Resource Strain (CARDIA)    Difficulty of Paying Living Expenses: Not hard at all  Food Insecurity: No Food Insecurity (10/29/2022)   Hunger Vital Sign    Worried About Running Out of Food in the Last Year: Never true    Ran Out of Food in the Last Year: Never true  Transportation Needs: No Transportation Needs (10/29/2022)   PRAPARE - Administrator, Civil ServiceTransportation    Lack of Transportation (Medical): No    Lack of Transportation (Non-Medical): No  Physical Activity: Inactive (10/29/2022)   Exercise Vital Sign    Days of Exercise per Week: 0 days    Minutes of Exercise per Session: 0 min  Stress: No Stress Concern Present (10/29/2022)   [employer] - Occupational Stress Questionnaire    Feeling of Stress : Not at all  Social Connections: Moderately Isolated (08/03/2022)   Social Connection and Isolation Panel [NHANES]    Frequency of Communication with Friends and Family: More than three times a week    Frequency of Social Gatherings with Friends and Family: More than three times a week    Attends Religious Services: More  than 4 times per year    Active Member of [employer] or Organizations: No    Attends BankerClub or Organization Meetings: Never    Marital Status: Widowed    Tobacco Counseling Counseling given: Not Answered   Clinical Intake:  Pre-visit preparation completed: Yes  Pain : 0-10 Pain Score: 6  Pain Type: Chronic pain Pain Location: Generalized Pain Descriptors / Indicators: Aching Pain Onset: More than a month ago Pain Frequency: Constant     Nutritional Status: BMI > 30  Obese Nutritional Risks: None Diabetes: No  How often do you need to have someone help you when you read instructions, pamphlets, or other written materials from your doctor or pharmacy?: 1 - Never  Diabetic? no  Interpreter Needed?: No  Information entered  by :: NAllen LPN   Activities of Daily Living    10/29/2022    3:01 PM  In your present state of health, do you have any difficulty performing the following activities:  Hearing? 1  Vision? 0  Difficulty concentrating or making decisions? 0  Walking or climbing stairs? 0  Dressing or bathing? 0  Doing errands, shopping? 0  Preparing Food and eating ? N  Using the Toilet? N  In the past six months, have you accidently leaked urine? N  Do you have problems with loss of bowel control? N  Managing your Medications? N  Managing your Finances? N  Housekeeping or managing your Housekeeping? N    Patient Care Team: [name], [name], NP as PCP - General (Internal Medicine) Wendall Stade, MD as PCP - Cardiology (Cardiology) Barron Alvine, MD (Inactive) as Consulting Physician (Urology) Freddy Finner, MD (Inactive) as Consulting Physician (Obstetrics and Gynecology) Davina Poke as Consulting Physician (Optometry) Janalyn Harder, MD (Inactive) as Consulting Physician (Dermatology) Gaspar Cola, [facility] as Pharmacist (Pharmacist) Marlowe Sax, RN as Case Manager (General Practice)  Indicate any recent Medical Services you may have received from  other than [facility] providers in the past year (date may be approximate).     Assessment:   This is a routine wellness examination for [facility].  Hearing/Vision screen Vision Screening - Comments:: Regular eye exams, Dr. Shea Evans  Dietary issues and exercise activities discussed: Current Exercise Habits: The patient does not participate in regular exercise at present   Goals Addressed             This Visit's Progress    Patient Stated       10/29/2022, wants to lose weight       Depression Screen    10/29/2022    3:01 PM 09/06/2022   10:10 AM 08/03/2022    1:38 PM 11/04/2021    1:13 PM 10/14/2021    2:28 PM 10/14/2021    2:27 PM 10/14/2021    2:23 PM  PHQ 2/9 Scores  PHQ - 2 Score 0 0 0 0 0 0 0    Fall Risk    10/29/2022    3:00 PM 09/06/2022   10:10 AM 08/03/2022    1:46 PM 02/04/2022   10:48 AM 11/04/2021    1:13 PM  Fall Risk   Falls in the past year? 0 0 0 0 0  Number falls in past yr: 0 0 0 0 0  Injury with Fall? 0 0 0 0   Risk for fall due to : Medication side effect  Medication side effect    Follow up Falls prevention discussed;Education provided;Falls evaluation completed  Falls evaluation completed;Education provided;Falls prevention discussed      FALL RISK PREVENTION PERTAINING TO THE HOME:  Any stairs in or around the home? Yes  If so, are there any without handrails? No  Home free of loose throw rugs in walkways, pet beds, electrical cords, etc? Yes  Adequate lighting in your home to reduce risk of falls? Yes   ASSISTIVE DEVICES UTILIZED TO PREVENT FALLS:  Life alert? No  Use of a cane, walker or w/c? No  Grab bars in the bathroom? Yes  Shower chair or bench in shower? No  Elevated toilet seat or a handicapped toilet? Yes   TIMED UP AND GO:  Was the test performed? No .      Cognitive Function:    11/16/2017    1:21 PM 11/08/2016  11:05 AM 11/03/2015   12:33 PM  MMSE - Mini Mental State Exam  Orientation to time 5 5 5   Orientation to Place 5 5 5    Registration 3 3 3   Attention/ Calculation 5 0 0  Recall 3 3 3   Language- name 2 objects 2 0 0  Language- repeat 1 1 1   Language- follow 3 step command 3 3 3   Language- read & follow direction 1 0 0  Write a sentence 1 0 0  Copy design 1 0 0  Total score 30 20 20         10/29/2022    3:02 PM  6CIT Screen  What Year? 0 points  What month? 0 points  What time? 0 points  Count back from 20 0 points  Months in reverse 0 points  Repeat phrase 2 points  Total Score 2 points    Immunizations Immunization History  Administered Date(s) Administered   Influenza Split 05/29/2012   Influenza, High Dose Seasonal PF 05/24/2018, 05/29/2019   Influenza,inj,Quad PF,6+ Mos 06/23/2016, 05/27/2017   Influenza-Unspecified 06/05/2020, 06/05/2021   PFIZER(Purple Top)SARS-COV-2 Vaccination 10/28/2019, 12/08/2019   Pneumococcal Conjugate-13 11/03/2015   Pneumococcal Polysaccharide-23 07/14/2006, 05/29/2012   Td 07/11/2008   Td (Adult), 2 Lf Tetanus Toxid, Preservative Free 07/11/2008   Tdap 04/23/2014   Zoster, Live 10/27/2010    TDAP status: Up to date  Flu Vaccine status: Up to date  Pneumococcal vaccine status: Up to date  Covid-19 vaccine status: Completed vaccines  Qualifies for Shingles Vaccine? Yes   Zostavax completed Yes   Shingrix Completed?: No.    Education has been provided regarding the importance of this vaccine. Patient has been advised to call insurance company to determine out of pocket expense if they have not yet received this vaccine. Advised may also receive vaccine at local pharmacy or Health Dept. Verbalized acceptance and understanding.  Screening Tests Health Maintenance  Topic Date Due   COVID-19 Vaccine (3 - Pfizer risk series) 01/05/2020   Medicare Annual Wellness (AWV)  10/15/2022   Zoster Vaccines- Shingrix (1 of 2) 12/05/2022 (Originally 01/25/1960)   INFLUENZA VACCINE  02/24/2023   DTaP/Tdap/Td (3 - Td or Tdap) 04/23/2024   Pneumonia Vaccine 4665+  Years old  Completed   DEXA SCAN  Completed   Hepatitis C Screening  Completed   HPV VACCINES  Aged Out   COLONOSCOPY (Pts [id number] Insurance coverage will need to be confirmed)  Discontinued    Health Maintenance  Health Maintenance Due  Topic Date Due   COVID-19 Vaccine (3 - Pfizer risk series) 01/05/2020   Medicare Annual Wellness (AWV)  10/15/2022    Colorectal cancer screening: No longer required.   Mammogram status: No longer required due to age.  Bone Density status: Completed 04/28/2022.   Lung Cancer Screening: (Low Dose CT Chest recommended if Age 5-80 years, 30 pack-year currently smoking OR have quit w/in 15years.) does not qualify.   Lung Cancer Screening Referral: no  Additional Screening:  Hepatitis C Screening: does qualify; Completed 03/17/2021  Vision Screening: Recommended annual ophthalmology exams for early detection of glaucoma and other disorders of the eye. Is the patient up to date with their annual eye exam?  Yes  Who is the provider or what is the name of the office in which the patient attends annual eye exams? Dr. Shea Evansunn If pt is not established with a provider, would they like to be referred to a provider to establish care? No .   Dental Screening: Recommended  annual dental exams for proper oral hygiene  Community Resource Referral / Chronic Care Management: CRR required this visit?  No   CCM required this visit?  No      Plan:     I have personally reviewed and noted the following in the patient's chart:   Medical and social history Use of alcohol, tobacco or illicit drugs  Current medications and supplements including opioid prescriptions. Patient is not currently taking opioid prescriptions. Functional ability and status Nutritional status Physical activity Advanced directives List of other physicians Hospitalizations, surgeries, and ER visits in previous 12 months Vitals Screenings to include cognitive, depression, and  falls Referrals and appointments  In addition, I have reviewed and discussed with patient certain preventive protocols, quality metrics, and best practice recommendations. A written personalized care plan for preventive services as well as general preventive health recommendations were provided to patient.     Barb Merino, LPN   12/02/7414   Nurse Notes: none  Due to this being a virtual visit, the after visit summary with patients personalized plan was offered to patient via mail or my-chart. Patient would like to access on my-chart

## 2022-10-29 NOTE — Patient Instructions (Signed)
Ms. Heather Shaw , Thank you for taking time to come for your Medicare Wellness Visit. I appreciate your ongoing commitment to your health goals. Please review the following plan we discussed and let me know if I can assist you in the future.   These are the goals we discussed:  Goals      CCM Expected Outcome:  Monitor, Self-Manage and Reduce Symptoms of: Chronic Pain     Current Barriers:  Chronic Disease Management support and education needs related to for effective management of Chronic Pain with long term Opioid use  Planned Interventions: Reviewed provider established plan for pain management. The patient has chronic pain, specifically with back pain and arthritis. The patient has long term use of Opioids and sees provider on a regular basis. States that it is hardly ever under a 6. The patient is doing stretches and she also says it is helping her pain level by not taking the statins. The statins are very debilitating to her. Reflective listening and support given.  Discussed importance of adherence to all scheduled medical appointments. The next appointment with pcp is 12-06-2022 and she will see a specialist on [facility] for her [facility]LD; Counseled on the importance of reporting any/all new or changed pain symptoms or management strategies to pain management provider; Advised patient to report to care team affect of pain on daily activities. The patient states that if she did not have the pain medications she could not function. She is thankful that she has good support system with her providers. ; Discussed use of relaxation techniques and/or diversional activities to assist with pain reduction (distraction, imagery, relaxation, massage, acupressure, TENS, heat, and cold application. The patient uses heat application and cold application also. The patient states she has a system that works for her well. Education and support given; Reviewed with patient prescribed pharmacological  and nonpharmacological pain relief strategies. Review and education provided; Advised patient to discuss changes in her level of pain or intensity of pain, or unresolved pain  with provider; Screening for signs and symptoms of depression related to chronic disease state;  Assessed social determinant of health barriers;  Review of Safety and falls prevention with the patient and making sure to be safe and mindful of her surroundings.  Symptom Management: Take medications as prescribed   Attend all scheduled provider appointments Call provider office for new concerns or questions  call the Suicide and Crisis Lifeline: 988 call the [place] National Suicide Prevention Lifeline: 301-285-47491-667-202-7194 or TTY: 714-262-73811-800-799-4 TTY 510-514-5969(1-(978) [phone]) to talk to a trained counselor call 1-800-273-TALK (toll free, 24 hour hotline) if experiencing a Mental Health or Behavioral Health Crisis   Follow Up Plan: Telephone follow up appointment with care management team member scheduled for: 12-13-2022 at 230 pm       CCM Expected Outcome:  Monitor, Self-Manage and Reduce Symptoms of:HLD     Current Barriers:  Knowledge Deficits related to medications to help with HLD and dietary help with maintaining healthy cholesterol levels Chronic Disease Management support and education needs related to effective management of HLD Lab Results  Component Value Date   CHOL 211 (H) 07/30/2022   HDL 35.90 (L) 07/30/2022   LDLCALC 143 (H) 02/05/2022   LDLDIRECT 128.0 07/30/2022   TRIG 293.0 (H) 07/30/2022   CHOLHDL 6 07/30/2022     Planned Interventions: Provider established cholesterol goals reviewed. Review of goals of cholesterol. The patient states she knows her levels are out of range. She states she just cannot  take the statin's they make her feel terrible. She is changing her dietary habits and this is helping she feels. She is going to go to a [facility] Clinic in July and she is eager to find out about this and how it can help  her. Education and support given; Counseled on importance of regular laboratory monitoring as prescribed. Review of having regular lab work to check levels; Provided HLD educational materials. Sending information on HLD and planning health meals in the mail to the patient. ; Reviewed role and benefits of statin for ASCVD risk reduction. The patient tried the Lipitor and the fenofibrate and both of these made her feel terrible. She told the pcp that she could not tolerate this. She is making dietary changes. She also states that she is going to a Lipid clinic due to her high cholesterol but the earliest they can see her is July.  Discussed strategies to manage statin-induced myalgias. The patient has tried different statins but still has issues with myalgias and pain. She is wanting to see what other options there are. Education and support given.; Reviewed importance of limiting foods high in cholesterol. Review and education will send information by My Chart for the patient ; Screening for signs and symptoms of depression related to chronic disease state;  Assessed social determinant of health barriers;   Symptom Management: Take medications as prescribed   Attend all scheduled provider appointments Call provider office for new concerns or questions  call the Suicide and Crisis Lifeline: 988 call the [place] National Suicide Prevention Lifeline: 251 307 2421 or TTY: 203-077-6742 TTY 509-023-1278) to talk to a trained counselor call 1-800-273-TALK (toll free, 24 hour hotline) if experiencing a Mental Health or Behavioral Health Crisis  - call for medicine refill 2 or 3 days before it runs out - take all medications exactly as prescribed - call doctor with any symptoms you believe are related to your medicine - call doctor when you experience any new symptoms - go to all doctor appointments as scheduled - adhere to prescribed diet: Heart healthy diet   Follow Up Plan: Telephone follow up  appointment with care management team member scheduled for: 12-13-2022 at 230 pm       CCM Expected Outcome:  Monitor, Self-Manage, and Reduce Symptoms of Hypertension     Current Barriers:  Chronic Disease Management support and education needs related to effective management of HTN BP Readings from Last 3 Encounters:  09/06/22 126/78  07/08/22 (!) 140/80  05/24/22 (!) 140/80     Planned Interventions: Evaluation of current treatment plan related to hypertension self management and patient's adherence to plan as established by provider. The patient states her blood pressures are more stable. The patient has been taking at home. Education on the benefits of continuation of monitoring of blood pressures and calling the office for abnormal readings. ;   Provided education to patient re: stroke prevention, s/s of heart attack and stroke; Reviewed prescribed diet heart healthy diet. The patient states she has made changes in her eating habits and trying to stay away from sweets. The patient states she is really focusing on dietary changes to help with controlling her blood pressures and cholesterol levels. Will send educational information in the mail to the patient on planning healthy meals. Reviewed medications with patient and discussed importance of compliance. The patient is compliant with medications. States no new concerns at this time with medications ;  Discussed plans with patient for ongoing care management follow up and provided  patient with direct contact information for care management team; Advised patient, providing education and rationale, to monitor blood pressure daily and record, calling PCP for findings outside established parameters. She is taking her blood pressures consistently at home and states that her blood pressure day before yesterday was 128/78.  Reviewed scheduled/upcoming provider appointments including: sees pcp on a regular basis, last time was 09-06-2022, next  appointment is 12-06-2022 at 10 am. Just had lab work will follow up in a month with pcp Advised patient to discuss changes in her blood pressures or heart health with provider; Provided education on prescribed diet heart healthy diet ;  Discussed complications of poorly controlled blood pressure such as heart disease, stroke, circulatory complications, vision complications, kidney impairment, sexual dysfunction;  Screening for signs and symptoms of depression related to chronic disease state;  Assessed social determinant of health barriers;   Symptom Management: Take medications as prescribed   Attend all scheduled provider appointments Call provider office for new concerns or questions  call the Suicide and Crisis Lifeline: 988 call the [place] National Suicide Prevention Lifeline: 857-821-4363 or TTY: 3647647516 TTY 309-810-2877) to talk to a trained counselor call 1-800-273-TALK (toll free, 24 hour hotline) if experiencing a Mental Health or Behavioral Health Crisis  check blood pressure 3 times per week learn about high blood pressure keep a blood pressure log take blood pressure log to all doctor appointments call doctor for signs and symptoms of high blood pressure develop an action plan for high blood pressure keep all doctor appointments take medications for blood pressure exactly as prescribed report new symptoms to your doctor  Follow Up Plan: Telephone follow up appointment with care management team member scheduled for: 12-13-2022 at 230 pm       Patient Stated     Get cholesterol level down with diet & exercise     Patient Stated     10/29/2022, wants to lose weight     Track and Manage My Blood Pressure-Hypertension     Timeframe:  Long-Range Goal Priority:  High Start Date:  09/17/21                           Expected End Date: 09/17/2022                      Follow Up within 90 days   - check blood pressure weekly    Why is this important?   You won't feel  high blood pressure, but it can still hurt your blood vessels.  High blood pressure can cause heart or kidney problems. It can also cause a stroke.  Making lifestyle changes like losing a little weight or eating less salt will help.  Checking your blood pressure at home and at different times of the day can help to control blood pressure.  If the doctor prescribes medicine remember to take it the way the doctor ordered.  Call the office if you cannot afford the medicine or if there are questions about it.     Notes:         This is a list of the screening recommended for you and due dates:  Health Maintenance  Topic Date Due   COVID-19 Vaccine (3 - Pfizer risk series) 01/05/2020   Zoster (Shingles) Vaccine (1 of 2) 12/05/2022*   Flu Shot  02/24/2023   Medicare Annual Wellness Visit  10/29/2023   DTaP/Tdap/Td vaccine (3 - Td or  Tdap) 04/23/2024   Pneumonia Vaccine  Completed   DEXA scan (bone density measurement)  Completed   Hepatitis C Screening: USPSTF Recommendation to screen - Ages 2118-79 yo.  Completed   HPV Vaccine  Aged Out   Colon Cancer Screening  Discontinued  *Topic was postponed. The date shown is not the original due date.    Advanced directives: Advance directive discussed with you today. .  Conditions/risks identified: none  Next appointment: Follow up in one year for your annual wellness visit    Preventive Care 65 Years and Older, Female Preventive care refers to lifestyle choices and visits with your health care provider that can promote health and wellness. What does preventive care include? A yearly physical exam. This is also called an annual well check. Dental exams once or twice a year. Routine eye exams. Ask your health care provider how often you should have your eyes checked. Personal lifestyle choices, including: Daily care of your teeth and gums. Regular physical activity. Eating a healthy diet. Avoiding tobacco and drug use. Limiting alcohol  use. Practicing safe sex. Taking low-dose aspirin every day. Taking vitamin and mineral supplements as recommended by your health care provider. What happens during an annual well check? The services and screenings done by your health care provider during your annual well check will depend on your age, overall health, lifestyle risk factors, and family history of disease. Counseling  Your health care provider may ask you questions about your: Alcohol use. Tobacco use. Drug use. Emotional well-being. Home and relationship well-being. Sexual activity. Eating habits. History of falls. Memory and ability to understand (cognition). Work and work Astronomerenvironment. Reproductive health. Screening  You may have the following tests or measurements: Height, weight, and BMI. Blood pressure. Lipid and cholesterol levels. These may be checked every 5 years, or more frequently if you are over 80 years old. Skin check. Lung cancer screening. You may have this screening every year starting at age 80 if you have a 30-pack-year history of smoking and currently smoke or have quit within the past 15 years. Fecal occult blood test (FOBT) of the stool. You may have this test every year starting at age 80. Flexible sigmoidoscopy or colonoscopy. You may have a sigmoidoscopy every 5 years or a colonoscopy every 10 years starting at age 80. Hepatitis C blood test. Hepatitis B blood test. Sexually transmitted disease (STD) testing. Diabetes screening. This is done by checking your blood sugar (glucose) after you have not eaten for a while (fasting). You may have this done every 1-3 years. Bone density scan. This is done to screen for osteoporosis. You may have this done starting at age 80. Mammogram. This may be done every 1-2 years. Talk to your health care provider about how often you should have regular mammograms. Talk with your health care provider about your test results, treatment options, and if necessary,  the need for more tests. Vaccines  Your health care provider may recommend certain vaccines, such as: Influenza vaccine. This is recommended every year. Tetanus, diphtheria, and acellular pertussis (Tdap, Td) vaccine. You may need a Td booster every 10 years. Zoster vaccine. You may need this after age 80. Pneumococcal 13-valent conjugate (PCV13) vaccine. One dose is recommended after age 80. Pneumococcal polysaccharide (PPSV23) vaccine. One dose is recommended after age 80. Talk to your health care provider about which screenings and vaccines you need and how often you need them. This information is not intended to replace advice given to you by your  health care provider. Make sure you discuss any questions you have with your health care provider. Document Released: 08/08/2015 Document Revised: 03/31/2016 Document Reviewed: 05/13/2015 Elsevier Interactive Patient Education  2017 [employer].  Fall Prevention in the Home Falls can cause injuries. They can happen to people of all ages. There are many things you can do to make your home safe and to help prevent falls. What can I do on the outside of my home? Regularly fix the edges of walkways and driveways and fix any cracks. Remove anything that might make you trip as you walk through a door, such as a raised step or threshold. Trim any bushes or trees on the path to your home. Use bright outdoor lighting. Clear any walking paths of anything that might make someone trip, such as rocks or tools. Regularly check to see if handrails are loose or broken. Make sure that both sides of any steps have handrails. Any raised decks and porches should have guardrails on the edges. Have any leaves, snow, or ice cleared regularly. Use sand or salt on walking paths during winter. Clean up any spills in your garage right away. This includes oil or grease spills. What can I do in the bathroom? Use night lights. Install grab bars by the toilet and in the  tub and shower. Do not use towel bars as grab bars. Use non-skid mats or decals in the tub or shower. If you need to sit down in the shower, use a plastic, non-slip stool. Keep the floor dry. Clean up any water that spills on the floor as soon as it happens. Remove soap buildup in the tub or shower regularly. Attach bath mats securely with double-sided non-slip rug tape. Do not have throw rugs and other things on the floor that can make you trip. What can I do in the bedroom? Use night lights. Make sure that you have a light by your bed that is easy to reach. Do not use any sheets or blankets that are too big for your bed. They should not hang down onto the floor. Have a firm chair that has side arms. You can use this for support while you get dressed. Do not have throw rugs and other things on the floor that can make you trip. What can I do in the kitchen? Clean up any spills right away. Avoid walking on wet floors. Keep items that you use a lot in easy-to-reach places. If you need to reach something above you, use a strong step stool that has a grab bar. Keep electrical cords out of the way. Do not use floor polish or wax that makes floors slippery. If you must use wax, use non-skid floor wax. Do not have throw rugs and other things on the floor that can make you trip. What can I do with my stairs? Do not leave any items on the stairs. Make sure that there are handrails on both sides of the stairs and use them. Fix handrails that are broken or loose. Make sure that handrails are as long as the stairways. Check any carpeting to make sure that it is firmly attached to the stairs. Fix any carpet that is loose or worn. Avoid having throw rugs at the top or bottom of the stairs. If you do have throw rugs, attach them to the floor with carpet tape. Make sure that you have a light switch at the top of the stairs and the bottom of the stairs. If you do not  have them, ask someone to add them for  you. What else can I do to help prevent falls? Wear shoes that: Do not have high heels. Have rubber bottoms. Are comfortable and fit you well. Are closed at the toe. Do not wear sandals. If you use a stepladder: Make sure that it is fully opened. Do not climb a closed stepladder. Make sure that both sides of the stepladder are locked into place. Ask someone to hold it for you, if possible. Clearly mark and make sure that you can see: Any grab bars or handrails. First and last steps. Where the edge of each step is. Use tools that help you move around (mobility aids) if they are needed. These include: Canes. Walkers. Scooters. Crutches. Turn on the lights when you go into a dark area. Replace any light bulbs as soon as they burn out. Set up your furniture so you have a clear path. Avoid moving your furniture around. If any of your floors are uneven, fix them. If there are any pets around you, be aware of where they are. Review your medicines with your doctor. Some medicines can make you feel dizzy. This can increase your chance of falling. Ask your doctor what other things that you can do to help prevent falls. This information is not intended to replace advice given to you by your health care provider. Make sure you discuss any questions you have with your health care provider. Document Released: 05/08/2009 Document Revised: 12/18/2015 Document Reviewed: 08/16/2014 Elsevier Interactive Patient Education  2017 [employer].

## 2022-11-03 ENCOUNTER — Other Ambulatory Visit: Payer: Self-pay | Admitting: Nurse Practitioner

## 2022-11-03 DIAGNOSIS — F1120 Opioid dependence, uncomplicated: Secondary | ICD-10-CM

## 2022-11-03 DIAGNOSIS — M4807 Spinal stenosis, lumbosacral region: Secondary | ICD-10-CM

## 2022-11-03 DIAGNOSIS — G8929 Other chronic pain: Secondary | ICD-10-CM

## 2022-11-03 DIAGNOSIS — M5137 Other intervertebral disc degeneration, lumbosacral region: Secondary | ICD-10-CM

## 2022-11-04 ENCOUNTER — Other Ambulatory Visit (HOSPITAL_COMMUNITY): Payer: Self-pay

## 2022-11-04 MED ORDER — HYDROCODONE-ACETAMINOPHEN 7.5-325 MG PO TABS
1.0000 | ORAL_TABLET | Freq: Three times a day (TID) | ORAL | 0 refills | Status: DC
  Filled 2022-11-08: qty 90, 30d supply, fill #0

## 2022-11-05 ENCOUNTER — Other Ambulatory Visit (HOSPITAL_COMMUNITY): Payer: Self-pay

## 2022-11-08 ENCOUNTER — Other Ambulatory Visit (HOSPITAL_COMMUNITY): Payer: Self-pay

## 2022-11-08 ENCOUNTER — Other Ambulatory Visit: Payer: Self-pay

## 2022-12-03 ENCOUNTER — Telehealth: Payer: Self-pay | Admitting: Nurse Practitioner

## 2022-12-03 DIAGNOSIS — F1120 Opioid dependence, uncomplicated: Secondary | ICD-10-CM

## 2022-12-03 DIAGNOSIS — M4807 Spinal stenosis, lumbosacral region: Secondary | ICD-10-CM

## 2022-12-03 DIAGNOSIS — M5137 Other intervertebral disc degeneration, lumbosacral region: Secondary | ICD-10-CM

## 2022-12-03 DIAGNOSIS — G8929 Other chronic pain: Secondary | ICD-10-CM

## 2022-12-03 NOTE — Telephone Encounter (Signed)
Heather Shaw [id number]     Pt needs her Rx #: [id number]  HYDROcodone-acetaminophen (NORCO) 7.5-325 MG tablet [[id number]] refilled. She has an appt on 5/16.

## 2022-12-06 ENCOUNTER — Ambulatory Visit: Payer: Medicare Other | Admitting: Nurse Practitioner

## 2022-12-06 ENCOUNTER — Other Ambulatory Visit: Payer: Self-pay | Admitting: Nurse Practitioner

## 2022-12-06 ENCOUNTER — Other Ambulatory Visit (HOSPITAL_COMMUNITY): Payer: Self-pay

## 2022-12-06 DIAGNOSIS — M4807 Spinal stenosis, lumbosacral region: Secondary | ICD-10-CM

## 2022-12-06 DIAGNOSIS — F1120 Opioid dependence, uncomplicated: Secondary | ICD-10-CM

## 2022-12-06 DIAGNOSIS — G8929 Other chronic pain: Secondary | ICD-10-CM

## 2022-12-06 DIAGNOSIS — M5137 Other intervertebral disc degeneration, lumbosacral region: Secondary | ICD-10-CM

## 2022-12-07 ENCOUNTER — Other Ambulatory Visit: Payer: Self-pay | Admitting: Nurse Practitioner

## 2022-12-07 DIAGNOSIS — G8929 Other chronic pain: Secondary | ICD-10-CM

## 2022-12-07 DIAGNOSIS — M5137 Other intervertebral disc degeneration, lumbosacral region: Secondary | ICD-10-CM

## 2022-12-07 DIAGNOSIS — F1120 Opioid dependence, uncomplicated: Secondary | ICD-10-CM

## 2022-12-07 DIAGNOSIS — M4807 Spinal stenosis, lumbosacral region: Secondary | ICD-10-CM

## 2022-12-07 NOTE — Telephone Encounter (Signed)
Pt called to get her Rx #: [id number]  HYDROcodone-acetaminophen (NORCO) 7.5-325 MG tablet [[id number]] refilled she is out.

## 2022-12-07 NOTE — Telephone Encounter (Signed)
The patient will have to  wait until 5/15 for another refill

## 2022-12-08 ENCOUNTER — Other Ambulatory Visit (HOSPITAL_COMMUNITY): Payer: Self-pay

## 2022-12-08 MED ORDER — HYDROCODONE-ACETAMINOPHEN 7.5-325 MG PO TABS
1.0000 | ORAL_TABLET | Freq: Three times a day (TID) | ORAL | 0 refills | Status: DC
  Filled 2022-12-08: qty 90, 30d supply, fill #0

## 2022-12-08 NOTE — Telephone Encounter (Signed)
Called patient and reminded her of the medication contract and patient responsibilities. We received numerous requests and the patient was advised of the date it would be refilled but she continued to call.   I advised the patient to call once and the medication will be refilled when it's due for refill.

## 2022-12-08 NOTE — Addendum Note (Signed)
Addended by: Alysia Penna L on: 12/08/2022 09:15 AM   Modules accepted: Orders

## 2022-12-09 ENCOUNTER — Other Ambulatory Visit (HOSPITAL_COMMUNITY): Payer: Self-pay

## 2022-12-09 ENCOUNTER — Ambulatory Visit (INDEPENDENT_AMBULATORY_CARE_PROVIDER_SITE_OTHER): Payer: Medicare Other | Admitting: Nurse Practitioner

## 2022-12-09 ENCOUNTER — Encounter: Payer: Self-pay | Admitting: Nurse Practitioner

## 2022-12-09 VITALS — BP 150/90 | HR 86 | Temp 98.5°F | Resp 16 | Ht 65.0 in | Wt 205.0 lb

## 2022-12-09 DIAGNOSIS — G8929 Other chronic pain: Secondary | ICD-10-CM | POA: Diagnosis not present

## 2022-12-09 DIAGNOSIS — E782 Mixed hyperlipidemia: Secondary | ICD-10-CM

## 2022-12-09 DIAGNOSIS — G4700 Insomnia, unspecified: Secondary | ICD-10-CM | POA: Insufficient documentation

## 2022-12-09 DIAGNOSIS — R739 Hyperglycemia, unspecified: Secondary | ICD-10-CM

## 2022-12-09 DIAGNOSIS — M5137 Other intervertebral disc degeneration, lumbosacral region: Secondary | ICD-10-CM

## 2022-12-09 DIAGNOSIS — F1120 Opioid dependence, uncomplicated: Secondary | ICD-10-CM

## 2022-12-09 DIAGNOSIS — E559 Vitamin D deficiency, unspecified: Secondary | ICD-10-CM

## 2022-12-09 DIAGNOSIS — I10 Essential (primary) hypertension: Secondary | ICD-10-CM | POA: Diagnosis not present

## 2022-12-09 DIAGNOSIS — F4323 Adjustment disorder with mixed anxiety and depressed mood: Secondary | ICD-10-CM | POA: Diagnosis not present

## 2022-12-09 DIAGNOSIS — M4807 Spinal stenosis, lumbosacral region: Secondary | ICD-10-CM

## 2022-12-09 LAB — LIPID PANEL
Cholesterol: 209 mg/dL — ABNORMAL HIGH (ref 0–200)
HDL: 42.7 mg/dL (ref >39.00)
LDL Cholesterol: 130 mg/dL — ABNORMAL HIGH (ref 0–99)
NonHDL: 165.95
Total CHOL/HDL Ratio: 5
Triglycerides: 179 mg/dL — ABNORMAL HIGH (ref 0.0–149.0)
VLDL: 35.8 mg/dL (ref 0.0–40.0)

## 2022-12-09 LAB — HEMOGLOBIN A1C: Hgb A1c MFr Bld: 5.8 % (ref 4.6–6.5)

## 2022-12-09 LAB — VITAMIN D 25 HYDROXY (VIT D DEFICIENCY, FRACTURES): VITD: 10.44 ng/mL — ABNORMAL LOW (ref 30.00–100.00)

## 2022-12-09 MED ORDER — HYDROCODONE-ACETAMINOPHEN 7.5-325 MG PO TABS: 1.0000 | ORAL_TABLET | Freq: Three times a day (TID) | ORAL | 0 refills | Status: DC

## 2022-12-09 MED ORDER — HYDROCHLOROTHIAZIDE 25 MG PO TABS
25.0000 mg | ORAL_TABLET | Freq: Every day | ORAL | 1 refills | Status: DC
  Filled 2022-12-09: qty 90, 90d supply, fill #0

## 2022-12-09 MED ORDER — HYDROCODONE-ACETAMINOPHEN 7.5-325 MG PO TABS
1.0000 | ORAL_TABLET | Freq: Three times a day (TID) | ORAL | 0 refills | Status: DC
  Filled 2023-01-07: qty 90, 30d supply, fill #0

## 2022-12-09 MED ORDER — HYDROCODONE-ACETAMINOPHEN 7.5-325 MG PO TABS
1.0000 | ORAL_TABLET | Freq: Three times a day (TID) | ORAL | 0 refills | Status: DC
  Filled 2023-02-07: qty 90, 30d supply, fill #0

## 2022-12-09 NOTE — Assessment & Plan Note (Signed)
Reports increased anxiety due to adult son with ETOH abuse which also affects her grandchild. She agreed to psychology referral Advised to also join a local [place] support group.

## 2022-12-09 NOTE — Assessment & Plan Note (Signed)
Repeat hgbA1c 

## 2022-12-09 NOTE — Assessment & Plan Note (Signed)
Elevated BP today due to emotional distress. Reports she is compliance with meds (amlodipine and HCTZ) and DASH diet Reports home BP 130s/70s BP Readings from Last 3 Encounters:  12/09/22 (!) 150/90  09/06/22 126/78  07/08/22 (!) 140/80    Maintain med doses Advised to send BP reading via mychart in 1week

## 2022-12-09 NOTE — Assessment & Plan Note (Signed)
Repeat vit. D She has maintained 1000IU daily after completion of 50000IU weekly x 12weeks

## 2022-12-09 NOTE — Assessment & Plan Note (Signed)
Repeat lipid panel Has upcoming appt with lipid clinic 02/06/2023

## 2022-12-09 NOTE — Progress Notes (Signed)
Established Patient Visit  Patient: Heather Shaw   DOB: March 12, 1941   82 y.o. Female  MRN: [id number] Visit Date: 12/09/2022  Subjective:    Chief Complaint  Patient presents with   Medical Management of Chronic Issues    Fasting    HPI Chronic pain Controlled pain with hydrocodone and gabapentin. Denies any adverse effects PMP database reviewed, no red flags Last refill 12/08/2022 UTD with UDS and contract Sent refill for June, July and August F/up in 3months  Adjustment disorder with mixed anxiety and depressed mood Reports increased anxiety due to adult son with ETOH abuse which also affects her grandchild. She agreed to psychology referral Advised to also join a local [employer] support group.  HLD (hyperlipidemia) Repeat lipid panel Has upcoming appt with lipid clinic 02/06/2023  Hyperglycemia Repeat hgbA1c  Vitamin D deficiency Repeat vit. D She has maintained 1000IU daily after completion of 50000IU weekly x 12weeks  Hypertensive disorder Elevated BP today due to emotional distress. Reports she is compliance with meds (amlodipine and HCTZ) and DASH diet Reports home BP 130s/70s BP Readings from Last 3 Encounters:  12/09/22 (!) 150/90  09/06/22 126/78  07/08/22 (!) 140/80    Maintain med doses Advised to send BP reading via mychart in 1week  Reviewed medical, surgical, and social history today  Medications: Outpatient Medications Prior to Visit  Medication Sig   amLODipine (NORVASC) 10 MG tablet Take 1 tablet (10 mg total) by mouth daily.   Calcium Glycerophosphate (PRELIEF PO) Take 2 capsules by mouth daily as needed (uses when eating acitic foods).   fluocinonide-emollient (LIDEX-E) 0.05 % cream Apply 1 application topically 2 (two) times daily.   gabapentin (NEURONTIN) 100 MG capsule Take 2 capsules (200 mg total) by mouth at bedtime.   meclizine (ANTIVERT) 25 MG tablet Take 25 mg by mouth 3 (three) times daily as needed for  dizziness.   Vitamin D, Ergocalciferol, (DRISDOL) 1.25 MG (50000 UNIT) CAPS capsule Take 1 capsule (50,000 Units total) by mouth every 7 (seven) days.   [DISCONTINUED] hydrochlorothiazide (HYDRODIURIL) 25 MG tablet Take 1 tablet (25 mg total) by mouth daily.   [DISCONTINUED] HYDROcodone-acetaminophen (NORCO) 7.5-325 MG tablet Take 1 tablet by mouth every 8 (eight) hours.   No facility-administered medications prior to visit.   Reviewed past medical and social history.   ROS per HPI above      Objective:  BP (!) 150/90   Pulse 86   Temp 98.5 F (36.9 C) (Temporal)   Resp 16   Ht 5\' 5"  (1.651 m)   Wt 205 lb (93 kg)   SpO2 98%   BMI 34.11 kg/m      Physical Exam Vitals reviewed.  Constitutional:      General: She is not in acute distress. Cardiovascular:     Rate and Rhythm: Normal rate and regular rhythm.     Pulses: Normal pulses.     Heart sounds: Normal heart sounds.  Pulmonary:     Effort: Pulmonary effort is normal.     Breath sounds: Normal breath sounds.  Musculoskeletal:     Right lower leg: No edema.     Left lower leg: No edema.  Neurological:     Mental Status: She is alert and oriented to person, place, and time.     No results found for any visits on 12/09/22.    Assessment & Plan:    Problem List Items  Addressed This Visit       Cardiovascular and Mediastinum   Hypertensive disorder - Primary    Elevated BP today due to emotional distress. Reports she is compliance with meds (amlodipine and HCTZ) and DASH diet Reports home BP 130s/70s BP Readings from Last 3 Encounters:  12/09/22 (!) 150/90  09/06/22 126/78  07/08/22 (!) 140/80    Maintain med doses Advised to send BP reading via mychart in 1week      Relevant Medications   hydrochlorothiazide (HYDRODIURIL) 25 MG tablet     Other   Adjustment disorder with mixed anxiety and depressed mood    Reports increased anxiety due to adult son with ETOH abuse which also affects her  grandchild. She agreed to psychology referral Advised to also join a local [employer] support group.      Relevant Orders   Ambulatory referral to Psychology   Chronic pain    Controlled pain with hydrocodone and gabapentin. Denies any adverse effects PMP database reviewed, no red flags Last refill 12/08/2022 UTD with UDS and contract Sent refill for June, July and August F/up in 3months      Relevant Medications   HYDROcodone-acetaminophen (NORCO) 7.5-325 MG tablet (Start on 01/07/2023)   HYDROcodone-acetaminophen (NORCO) 7.5-325 MG tablet (Start on 02/07/2023)   HYDROcodone-acetaminophen (NORCO) 7.5-325 MG tablet (Start on 03/10/2023)   HLD (hyperlipidemia)    Repeat lipid panel Has upcoming appt with lipid clinic 02/06/2023      Relevant Medications   hydrochlorothiazide (HYDRODIURIL) 25 MG tablet   Other Relevant Orders   Lipid panel   Hyperglycemia    Repeat hgbA1c      Relevant Orders   Hemoglobin A1c   Opioid type dependence, continuous (HCC)   Relevant Medications   HYDROcodone-acetaminophen (NORCO) 7.5-325 MG tablet (Start on 01/07/2023)   HYDROcodone-acetaminophen (NORCO) 7.5-325 MG tablet (Start on 02/07/2023)   HYDROcodone-acetaminophen (NORCO) 7.5-325 MG tablet (Start on 03/10/2023)   Vitamin D deficiency    Repeat vit. D She has maintained 1000IU daily after completion of 50000IU weekly x 12weeks      Relevant Orders   VITAMIN D 25 Hydroxy (Vit-D Deficiency, Fractures)   Other Visit Diagnoses     DDD (degenerative disc disease), lumbosacral       Relevant Medications   HYDROcodone-acetaminophen (NORCO) 7.5-325 MG tablet (Start on 01/07/2023)   HYDROcodone-acetaminophen (NORCO) 7.5-325 MG tablet (Start on 02/07/2023)   HYDROcodone-acetaminophen (NORCO) 7.5-325 MG tablet (Start on 03/10/2023)   Spinal stenosis of lumbosacral region       Relevant Medications   HYDROcodone-acetaminophen (NORCO) 7.5-325 MG tablet (Start on 01/07/2023)    HYDROcodone-acetaminophen (NORCO) 7.5-325 MG tablet (Start on 02/07/2023)   HYDROcodone-acetaminophen (NORCO) 7.5-325 MG tablet (Start on 03/10/2023)      Return in about 3 months (around 03/11/2023) for HTN, Hyperglycemia, Chronic pain.     Alysia Penna, NP

## 2022-12-09 NOTE — Assessment & Plan Note (Signed)
Controlled pain with hydrocodone and gabapentin. Denies any adverse effects PMP database reviewed, no red flags Last refill 12/08/2022 UTD with UDS and contract Sent refill for June, July and August F/up in 3months

## 2022-12-09 NOTE — Patient Instructions (Addendum)
Go to lab Maintain med dose Send BP reading via mychart in 1week. Join local [employer] group for additional support. Pain med refill sent for June, July, and August.

## 2022-12-10 ENCOUNTER — Encounter: Payer: Self-pay | Admitting: Nurse Practitioner

## 2022-12-11 ENCOUNTER — Encounter: Payer: Self-pay | Admitting: Nurse Practitioner

## 2022-12-12 MED ORDER — VITAMIN D (ERGOCALCIFEROL) 1.25 MG (50000 UNIT) PO CAPS
50000.0000 [IU] | ORAL_CAPSULE | ORAL | 1 refills | Status: DC
  Filled 2022-12-12: qty 12, 84d supply, fill #0

## 2022-12-12 NOTE — Addendum Note (Signed)
Addended by: Alysia Penna L on: 12/12/2022 09:09 PM   Modules accepted: Orders

## 2022-12-13 ENCOUNTER — Other Ambulatory Visit: Payer: Self-pay

## 2022-12-13 ENCOUNTER — Other Ambulatory Visit (HOSPITAL_COMMUNITY): Payer: Self-pay

## 2022-12-13 ENCOUNTER — Ambulatory Visit (INDEPENDENT_AMBULATORY_CARE_PROVIDER_SITE_OTHER): Payer: Medicare Other

## 2022-12-13 ENCOUNTER — Encounter: Payer: Self-pay | Admitting: Nurse Practitioner

## 2022-12-13 ENCOUNTER — Telehealth: Payer: Medicare Other

## 2022-12-13 DIAGNOSIS — I10 Essential (primary) hypertension: Secondary | ICD-10-CM

## 2022-12-13 DIAGNOSIS — F1120 Opioid dependence, uncomplicated: Secondary | ICD-10-CM

## 2022-12-13 DIAGNOSIS — M4807 Spinal stenosis, lumbosacral region: Secondary | ICD-10-CM

## 2022-12-13 DIAGNOSIS — E782 Mixed hyperlipidemia: Secondary | ICD-10-CM

## 2022-12-13 DIAGNOSIS — G8929 Other chronic pain: Secondary | ICD-10-CM

## 2022-12-13 MED ORDER — HYDRALAZINE HCL 10 MG PO TABS
10.0000 mg | ORAL_TABLET | Freq: Two times a day (BID) | ORAL | 5 refills | Status: DC
  Filled 2022-12-13: qty 60, 30d supply, fill #0

## 2022-12-13 NOTE — Patient Instructions (Addendum)
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 practice is helpful. Find a mindfulness practice that works best for you. This may include one or more of the following: Meditation. This involves focusing your mind on a certain thought or activity. Breathing awareness exercises. These help you to stay present by focusing on your breath. Body scan. For this practice, you lie down and pay attention to each part of your body from head to toe. You can identify tension and soreness and consciously relax parts of your body. Yoga. Yoga involves stretching and breathing, and it can improve your ability to move and be flexible. It can also help you to test your body's limits, which can help you release stress. Mindful eating. This way of eating involves focusing on the taste, texture, color, and smell of each bite of food. This slows down eating and helps you feel full sooner. For this reason, it can be an important part of a weight loss plan. Find a podcast or  recording that provides guidance for breathing awareness, body scan, or meditation exercises. You can listen to these any time when you have a free moment to rest  without distractions. Follow your treatment plan as told by your health care provider. This may include taking regular medicines and making changes to your diet or lifestyle as recommended. Where to find more information You can find more information about MBSR from: Your health care provider. Community-based meditation centers or programs. Programs offered near you. Summary Mindfulness-based stress reduction (MBSR) is a program that teaches you how to consciously pay attention to the present moment. It is used to help you deal better with daily stress, feelings, and pain. MBSR focuses on developing self-awareness, which allows you to respond to life stress without judgment or negative feelings. MBSR programs may involve learning different mindfulness practices, such as breathing exercises, meditation, yoga, body scan, or mindful eating. Find a mindfulness practice that works best for you, and set aside time for it on a regular basis. This information is not intended to replace advice given to you by your health care provider. Make sure you discuss any questions you have with your health care provider. Document Revised: 02/19/2021 Document Reviewed: 02/19/2021 Elsevier Patient Education  2023 [employer].
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
## 2022-12-13 NOTE — Telephone Encounter (Signed)
Reported BP: 12/11/22. 8:30am....BP reading 153/85. HR 85

## 2022-12-14 ENCOUNTER — Encounter: Payer: Self-pay | Admitting: Nurse Practitioner

## 2022-12-15 ENCOUNTER — Encounter: Payer: Self-pay | Admitting: Nurse Practitioner

## 2022-12-17 ENCOUNTER — Telehealth: Payer: Self-pay | Admitting: Nurse Practitioner

## 2022-12-17 ENCOUNTER — Other Ambulatory Visit (HOSPITAL_COMMUNITY): Payer: Self-pay

## 2022-12-17 NOTE — Telephone Encounter (Signed)
Pt called in and was made aware to continue Amlodipine as prescribed.  Charlotte sent a message to her Earleen Reaper explaining this information

## 2022-12-17 NOTE — Telephone Encounter (Signed)
Pt is unsure if you intended for her to keep taking amlodipine with the new BP med. She said you can send the reply to mychart.

## 2022-12-21 ENCOUNTER — Encounter: Payer: Self-pay | Admitting: Nurse Practitioner

## 2022-12-24 DIAGNOSIS — I10 Essential (primary) hypertension: Secondary | ICD-10-CM | POA: Diagnosis not present

## 2022-12-24 DIAGNOSIS — E785 Hyperlipidemia, unspecified: Secondary | ICD-10-CM | POA: Diagnosis not present

## 2022-12-27 ENCOUNTER — Encounter: Payer: Self-pay | Admitting: Nurse Practitioner

## 2022-12-27 DIAGNOSIS — I10 Essential (primary) hypertension: Secondary | ICD-10-CM

## 2022-12-28 ENCOUNTER — Other Ambulatory Visit (HOSPITAL_COMMUNITY): Payer: Self-pay

## 2022-12-28 MED ORDER — HYDRALAZINE HCL 25 MG PO TABS
25.0000 mg | ORAL_TABLET | Freq: Two times a day (BID) | ORAL | 5 refills | Status: DC
  Filled 2022-12-28: qty 60, 30d supply, fill #0
  Filled 2023-02-28: qty 60, 30d supply, fill #1
  Filled 2023-03-31: qty 60, 30d supply, fill #2

## 2022-12-31 ENCOUNTER — Other Ambulatory Visit (HOSPITAL_COMMUNITY): Payer: Self-pay

## 2023-01-05 ENCOUNTER — Other Ambulatory Visit (HOSPITAL_COMMUNITY): Payer: Self-pay

## 2023-01-07 ENCOUNTER — Other Ambulatory Visit (HOSPITAL_COMMUNITY): Payer: Self-pay

## 2023-01-31 DIAGNOSIS — J029 Acute pharyngitis, unspecified: Secondary | ICD-10-CM | POA: Diagnosis not present

## 2023-01-31 DIAGNOSIS — J019 Acute sinusitis, unspecified: Secondary | ICD-10-CM | POA: Diagnosis not present

## 2023-01-31 DIAGNOSIS — Z20822 Contact with and (suspected) exposure to covid-19: Secondary | ICD-10-CM | POA: Diagnosis not present

## 2023-02-07 ENCOUNTER — Ambulatory Visit (INDEPENDENT_AMBULATORY_CARE_PROVIDER_SITE_OTHER): Payer: Medicare Other

## 2023-02-07 ENCOUNTER — Other Ambulatory Visit (HOSPITAL_COMMUNITY): Payer: Self-pay

## 2023-02-07 ENCOUNTER — Telehealth: Payer: Medicare Other

## 2023-02-07 DIAGNOSIS — G8929 Other chronic pain: Secondary | ICD-10-CM

## 2023-02-07 DIAGNOSIS — I10 Essential (primary) hypertension: Secondary | ICD-10-CM

## 2023-02-07 DIAGNOSIS — E782 Mixed hyperlipidemia: Secondary | ICD-10-CM

## 2023-02-07 NOTE — Patient Instructions (Signed)
Please call the care guide team at [phone] if you need to cancel or reschedule your appointment.   If you are experiencing a Mental Health or Behavioral Health Crisis or need someone to talk to, please call the Suicide and Crisis Lifeline: 988 call the [place] National Suicide Prevention Lifeline: (530)781-4082 or TTY: 909-123-7575 TTY 506 557 6882) to talk to a trained counselor call 1-800-273-TALK (toll free, 24 hour hotline)   Following is a copy of the CCM Program Consent:  CCM service includes personalized support from designated clinical staff supervised by the physician, including individualized plan of care and coordination with other care providers 24/7 contact phone numbers for assistance for urgent and routine care needs. Service will only be billed when office clinical staff spend 20 minutes or more in a month to coordinate care. Only one practitioner may furnish and bill the service in a calendar month. The patient may stop CCM services at amy time (effective at the end of the month) by phone call to the office staff. The patient will be responsible for cost sharing (co-pay) or up to 20% of the service fee (after annual deductible is met)  Following is a copy of your full provider care plan:   Goals Addressed             This Visit's Progress    CCM Expected Outcome:  Monitor, Self-Manage and Reduce Symptoms of: Chronic Pain       Current Barriers:  Chronic Disease Management support and education needs related to for effective management of Chronic Pain with long term Opioid use  Planned Interventions: Reviewed provider established plan for pain management. The patient has chronic pain, specifically with back pain and arthritis. The patient has long term use of Opioids and sees provider on a regular basis. States that it is hardly ever under a 6. The patient is doing stretches and she also says it is helping her pain level by not taking the statins. The statins are very  debilitating to her. In July she is going to the Lipid clinic to see if they can help with her cholesterol levels. Her levels are coming down but still elevated. She will see the cardiologist tomorrow and is open to the ideas of the cardiologist.  Discussed importance of adherence to all scheduled medical appointments. Saw the pcp recently,  she will see a specialist on 02-08-2023 at the [facility] for her HLD and HTN; Counseled on the importance of reporting any/all new or changed pain symptoms or management strategies to pain management provider; Advised patient to report to care team affect of pain on daily activities. The patient states that if she did not have the pain medications she could not function. She is thankful that she has good support system with her providers. ; Discussed use of relaxation techniques and/or diversional activities to assist with pain reduction (distraction, imagery, relaxation, massage, acupressure, TENS, heat, and cold application. The patient uses heat application and cold application also. The patient states she has a system that works for her well. Education and support given; Reviewed with patient prescribed pharmacological and nonpharmacological pain relief strategies. Review and education provided. Discussed mindfulness and self care. The patient is having a lot of stress in  her life right now related to her son and his drinking.  Advised patient to discuss changes in her level of pain or intensity of pain, or unresolved pain  with provider; Screening for signs and symptoms of depression related to chronic disease state;  Assessed  social determinant of health barriers;  Review of Safety and falls prevention with the patient and making sure to be safe and mindful of her surroundings.  Symptom Management: Take medications as prescribed   Attend all scheduled provider appointments Call provider office for new concerns or questions  call the Suicide and Crisis  Lifeline: 988 call the [place] National Suicide Prevention Lifeline: (709) 394-3690 or TTY: 403 770 8119 TTY (838)777-7814) to talk to a trained counselor call 1-800-273-TALK (toll free, 24 hour hotline) if experiencing a Mental Health or Behavioral Health Crisis   Follow Up Plan: Telephone follow up appointment with care management team member scheduled for: 03-24-2023 at 230 pm       CCM Expected Outcome:  Monitor, Self-Manage and Reduce Symptoms of:HLD       Current Barriers:  Knowledge Deficits related to medications to help with HLD and dietary help with maintaining healthy cholesterol levels Chronic Disease Management support and education needs related to effective management of HLD Lab Results  Component Value Date   CHOL 209 (H) 12/09/2022   HDL 42.70 12/09/2022   LDLCALC 130 (H) 12/09/2022   LDLDIRECT 128.0 07/30/2022   TRIG 179.0 (H) 12/09/2022   CHOLHDL 5 12/09/2022     Planned Interventions: Provider established cholesterol goals reviewed. Review of goals of cholesterol. The patient states she knows her levels are out of range. She states she just cannot take the statin's they make her feel terrible. She is changing her dietary habits and this is helping she feels. She is going to go to a Lipid Clinic in July and she is eager to find out about this and how it can help her, her appointment is tomorrow. Education and support given; Counseled on importance of regular laboratory monitoring as prescribed. Review of having regular lab work to check levels. Review of levels and she has had some drops in the levels and is thankful for this. The patient is keeping her appointment with the lipid clinic ; Provided HLD educational materials. Sending information on HLD and planning health meals in the mail to the patient. ; Reviewed role and benefits of statin for ASCVD risk reduction. The patient tried the Lipitor and the fenofibrate and both of these made her feel terrible. She told the pcp  that she could not tolerate this. She is making dietary changes. She also states that she is going to a Lipid clinic due to her high cholesterol but the earliest they can see her is July.  Discussed strategies to manage statin-induced myalgias. The patient has tried different statins but still has issues with myalgias and pain. She is wanting to see what other options there are. Education and support given.; Reviewed importance of limiting foods high in cholesterol. Review and education will send information by My Chart for the patient. ; Screening for signs and symptoms of depression related to chronic disease state;  Assessed social determinant of health barriers;   Symptom Management: Take medications as prescribed   Attend all scheduled provider appointments Call provider office for new concerns or questions  call the Suicide and Crisis Lifeline: 988 call the [place] National Suicide Prevention Lifeline: 567-828-7716 or TTY: (646)613-9115 TTY (289)759-3449) to talk to a trained counselor call 1-800-273-TALK (toll free, 24 hour hotline) if experiencing a Mental Health or Behavioral Health Crisis  - call for medicine refill 2 or 3 days before it runs out - take all medications exactly as prescribed - call doctor with any symptoms you believe are related to your medicine -  call doctor when you experience any new symptoms - go to all doctor appointments as scheduled - adhere to prescribed diet: Heart healthy diet   Follow Up Plan: Telephone follow up appointment with care management team member scheduled for: 03-24-2023 at 230 pm       CCM Expected Outcome:  Monitor, Self-Manage, and Reduce Symptoms of Hypertension       Current Barriers:  Chronic Disease Management support and education needs related to effective management of HTN BP Readings from Last 3 Encounters:  12/27/22 (!) 150/84  12/15/22 (!) 150/81  12/14/22 (!) 153/85   Self reported: 150/86 02-07-2023  Planned  Interventions: Evaluation of current treatment plan related to hypertension self management and patient's adherence to plan as established by provider. The patients blood pressures have been up and down.  The patient is reporting her blood pressures daily to the pcp. She says it is better and the patient is going to see the cardiologist tomorrow. Her pressure today was 150/86.  The patient has been taking at home. Education on the benefits of continuation of monitoring of blood pressures and calling the office for abnormal readings. ;   Provided education to patient re: stroke prevention, s/s of heart attack and stroke. Reviewed the risk of heart attack and stroke with uncontrolled blood pressures; Reviewed prescribed diet heart healthy diet. The patient states she has made changes in her eating habits and trying to stay away from sweets. The patient states she is really focusing on dietary changes to help with controlling her blood pressures and cholesterol levels. Will send educational information in the mail to the patient on planning healthy meals. Reviewed medications with patient and discussed importance of compliance. The patient is compliant with medications. States no new concerns at this time with medications ;  Discussed plans with patient for ongoing care management follow up and provided patient with direct contact information for care management team; Advised patient, providing education and rationale, to monitor blood pressure daily and record, calling PCP for findings outside established parameters. She is taking her blood pressures consistently at home and states that her blood pressure today was 150/86. She states she does not want to go on another medication for blood pressures but she may have to for a short period of time. Education and support given. .  Reviewed scheduled/upcoming provider appointments including: sees pcp on a regular basis, will see the cardiologist tomorrow. Advised  patient to discuss changes in her blood pressures or heart health with provider; Provided education on prescribed diet heart healthy diet ;  Discussed complications of poorly controlled blood pressure such as heart disease, stroke, circulatory complications, vision complications, kidney impairment, sexual dysfunction;  Screening for signs and symptoms of depression related to chronic disease state;  Assessed social determinant of health barriers;  Resources provided for help with stress relief. Will attach mindfulness information on to the patients chart as well as support for Al-Anon. The patient also has the [facility] number to call for needs or support in between visits. The patient states her stress level is better. She is focusing on self care and making sure she gets the things she needs.  Symptom Management: Take medications as prescribed   Attend all scheduled provider appointments Call provider office for new concerns or questions  call the Suicide and Crisis Lifeline: 988 call the [place] National Suicide Prevention Lifeline: 867-711-0234 or TTY: 250-121-5390 TTY 8430143895) to talk to a trained counselor call 1-800-273-TALK (toll free, 24 hour hotline) if experiencing  a Mental Health or Behavioral Health Crisis  check blood pressure 3 times per week learn about high blood pressure keep a blood pressure log take blood pressure log to all doctor appointments call doctor for signs and symptoms of high blood pressure develop an action plan for high blood pressure keep all doctor appointments take medications for blood pressure exactly as prescribed report new symptoms to your doctor  Follow Up Plan: Telephone follow up appointment with care management team member scheduled for: 03-24-2023 at 230 pm          Patient verbalizes understanding of instructions and care plan provided today and agrees to view in MyChart. Active MyChart status and patient understanding of how to access  instructions and care plan via MyChart confirmed with patient.  Telephone follow up appointment with care management team member scheduled for: 03-24-2023 at 230 pm

## 2023-02-07 NOTE — Chronic Care Management (AMB) (Signed)
Chronic Care Management   CCM RN Visit Note  02/07/2023 Name: Heather Shaw MRN: [id number] DOB: 1941/07/10  Subjective: Heather Shaw is a 82 y.o. year old female who is a primary care patient of [name], [name], NP. The patient was referred to the Chronic Care Management team for assistance with care management needs subsequent to provider initiation of CCM services and plan of care.    Today's Visit:  Engaged with patient by telephone for follow up visit.     SDOH Interventions Today    Flowsheet Row Most Recent Value  SDOH Interventions   Stress Interventions Other (Comment)  [the patient is not as stressed, is doing better with her blood pressure readings]  Health Literacy Interventions Intervention Not Indicated         Goals Addressed             This Visit's Progress    CCM Expected Outcome:  Monitor, Self-Manage and Reduce Symptoms of: Chronic Pain       Current Barriers:  Chronic Disease Management support and education needs related to for effective management of Chronic Pain with long term Opioid use  Planned Interventions: Reviewed provider established plan for pain management. The patient has chronic pain, specifically with back pain and arthritis. The patient has long term use of Opioids and sees provider on a regular basis. States that it is hardly ever under a 6. The patient is doing stretches and she also says it is helping her pain level by not taking the statins. The statins are very debilitating to her. In July she is going to the Lipid clinic to see if they can help with her cholesterol levels. Her levels are coming down but still elevated. She will see the cardiologist tomorrow and is open to the ideas of the cardiologist.  Discussed importance of adherence to all scheduled medical appointments. Saw the pcp recently,  she will see a specialist on 02-08-2023 at the [facility] for her HLD and HTN; Counseled on the importance of reporting any/all  new or changed pain symptoms or management strategies to pain management provider; Advised patient to report to care team affect of pain on daily activities. The patient states that if she did not have the pain medications she could not function. She is thankful that she has good support system with her providers. ; Discussed use of relaxation techniques and/or diversional activities to assist with pain reduction (distraction, imagery, relaxation, massage, acupressure, TENS, heat, and cold application. The patient uses heat application and cold application also. The patient states she has a system that works for her well. Education and support given; Reviewed with patient prescribed pharmacological and nonpharmacological pain relief strategies. Review and education provided. Discussed mindfulness and self care. The patient is having a lot of stress in  her life right now related to her son and his drinking.  Advised patient to discuss changes in her level of pain or intensity of pain, or unresolved pain  with provider; Screening for signs and symptoms of depression related to chronic disease state;  Assessed social determinant of health barriers;  Review of Safety and falls prevention with the patient and making sure to be safe and mindful of her surroundings.  Symptom Management: Take medications as prescribed   Attend all scheduled provider appointments Call provider office for new concerns or questions  call the Suicide and Crisis Lifeline: 988 call the [place] National Suicide Prevention Lifeline: 314-318-6117 or TTY: (763)512-2278 TTY [phone]) to talk  to a trained counselor call 1-800-273-TALK (toll free, 24 hour hotline) if experiencing a Mental Health or Behavioral Health Crisis   Follow Up Plan: Telephone follow up appointment with care management team member scheduled for: 03-24-2023 at 230 pm       CCM Expected Outcome:  Monitor, Self-Manage and Reduce Symptoms of:HLD       Current  Barriers:  Knowledge Deficits related to medications to help with HLD and dietary help with maintaining healthy cholesterol levels Chronic Disease Management support and education needs related to effective management of HLD Lab Results  Component Value Date   CHOL 209 (H) 12/09/2022   HDL 42.70 12/09/2022   LDLCALC 130 (H) 12/09/2022   LDLDIRECT 128.0 07/30/2022   TRIG 179.0 (H) 12/09/2022   CHOLHDL 5 12/09/2022     Planned Interventions: Provider established cholesterol goals reviewed. Review of goals of cholesterol. The patient states she knows her levels are out of range. She states she just cannot take the statin's they make her feel terrible. She is changing her dietary habits and this is helping she feels. She is going to go to a Lipid Clinic in July and she is eager to find out about this and how it can help her, her appointment is tomorrow. Education and support given; Counseled on importance of regular laboratory monitoring as prescribed. Review of having regular lab work to check levels. Review of levels and she has had some drops in the levels and is thankful for this. The patient is keeping her appointment with the lipid clinic ; Provided HLD educational materials. Sending information on HLD and planning health meals in the mail to the patient. ; Reviewed role and benefits of statin for ASCVD risk reduction. The patient tried the Lipitor and the fenofibrate and both of these made her feel terrible. She told the pcp that she could not tolerate this. She is making dietary changes. She also states that she is going to a Lipid clinic due to her high cholesterol but the earliest they can see her is July.  Discussed strategies to manage statin-induced myalgias. The patient has tried different statins but still has issues with myalgias and pain. She is wanting to see what other options there are. Education and support given.; Reviewed importance of limiting foods high in cholesterol. Review  and education will send information by My Chart for the patient. ; Screening for signs and symptoms of depression related to chronic disease state;  Assessed social determinant of health barriers;   Symptom Management: Take medications as prescribed   Attend all scheduled provider appointments Call provider office for new concerns or questions  call the Suicide and Crisis Lifeline: 988 call the [place] National Suicide Prevention Lifeline: 307-030-3730 or TTY: 803-201-0119 TTY (231)272-8629) to talk to a trained counselor call 1-800-273-TALK (toll free, 24 hour hotline) if experiencing a Mental Health or Behavioral Health Crisis  - call for medicine refill 2 or 3 days before it runs out - take all medications exactly as prescribed - call doctor with any symptoms you believe are related to your medicine - call doctor when you experience any new symptoms - go to all doctor appointments as scheduled - adhere to prescribed diet: Heart healthy diet   Follow Up Plan: Telephone follow up appointment with care management team member scheduled for: 03-24-2023 at 230 pm       CCM Expected Outcome:  Monitor, Self-Manage, and Reduce Symptoms of Hypertension       Current Barriers:  Chronic  Disease Management support and education needs related to effective management of HTN BP Readings from Last 3 Encounters:  12/27/22 (!) 150/84  12/15/22 (!) 150/81  12/14/22 (!) 153/85   Self reported: 150/86 02-07-2023  Planned Interventions: Evaluation of current treatment plan related to hypertension self management and patient's adherence to plan as established by provider. The patients blood pressures have been up and down.  The patient is reporting her blood pressures daily to the pcp. She says it is better and the patient is going to see the cardiologist tomorrow. Her pressure today was 150/86.  The patient has been taking at home. Education on the benefits of continuation of monitoring of blood pressures  and calling the office for abnormal readings. ;   Provided education to patient re: stroke prevention, s/s of heart attack and stroke. Reviewed the risk of heart attack and stroke with uncontrolled blood pressures; Reviewed prescribed diet heart healthy diet. The patient states she has made changes in her eating habits and trying to stay away from sweets. The patient states she is really focusing on dietary changes to help with controlling her blood pressures and cholesterol levels. Will send educational information in the mail to the patient on planning healthy meals. Reviewed medications with patient and discussed importance of compliance. The patient is compliant with medications. States no new concerns at this time with medications ;  Discussed plans with patient for ongoing care management follow up and provided patient with direct contact information for care management team; Advised patient, providing education and rationale, to monitor blood pressure daily and record, calling PCP for findings outside established parameters. She is taking her blood pressures consistently at home and states that her blood pressure today was 150/86. She states she does not want to go on another medication for blood pressures but she may have to for a short period of time. Education and support given. .  Reviewed scheduled/upcoming provider appointments including: sees pcp on a regular basis, will see the cardiologist tomorrow. Advised patient to discuss changes in her blood pressures or heart health with provider; Provided education on prescribed diet heart healthy diet ;  Discussed complications of poorly controlled blood pressure such as heart disease, stroke, circulatory complications, vision complications, kidney impairment, sexual dysfunction;  Screening for signs and symptoms of depression related to chronic disease state;  Assessed social determinant of health barriers;  Resources provided for help with  stress relief. Will attach mindfulness information on to the patients chart as well as support for Al-Anon. The patient also has the [facility] number to call for needs or support in between visits. The patient states her stress level is better. She is focusing on self care and making sure she gets the things she needs.  Symptom Management: Take medications as prescribed   Attend all scheduled provider appointments Call provider office for new concerns or questions  call the Suicide and Crisis Lifeline: 988 call the [place] National Suicide Prevention Lifeline: 816 497 3093 or TTY: 402 092 0447 TTY [phone]) to talk to a trained counselor call 1-800-273-TALK (toll free, 24 hour hotline) if experiencing a Mental Health or Behavioral Health Crisis  check blood pressure 3 times per week learn about high blood pressure keep a blood pressure log take blood pressure log to all doctor appointments call doctor for signs and symptoms of high blood pressure develop an action plan for high blood pressure keep all doctor appointments take medications for blood pressure exactly as prescribed report new symptoms to your doctor  Follow  Up Plan: Telephone follow up appointment with care management team member scheduled for: 03-24-2023 at 230 pm          Plan:Telephone follow up appointment with care management team member scheduled for:  03-24-2023 at 230 pm  Alto Denver RN, MSN, CCM RN Care Manager  Chronic Care Management Direct Number: [phone]

## 2023-02-08 ENCOUNTER — Encounter (HOSPITAL_BASED_OUTPATIENT_CLINIC_OR_DEPARTMENT_OTHER): Payer: Self-pay | Admitting: Internal Medicine

## 2023-02-08 ENCOUNTER — Other Ambulatory Visit (HOSPITAL_COMMUNITY): Payer: Self-pay

## 2023-02-08 ENCOUNTER — Ambulatory Visit (HOSPITAL_BASED_OUTPATIENT_CLINIC_OR_DEPARTMENT_OTHER): Payer: Medicare Other | Admitting: Internal Medicine

## 2023-02-08 VITALS — BP 148/88 | HR 91 | Ht 65.0 in | Wt 200.1 lb

## 2023-02-08 DIAGNOSIS — T466X5A Adverse effect of antihyperlipidemic and antiarteriosclerotic drugs, initial encounter: Secondary | ICD-10-CM

## 2023-02-08 DIAGNOSIS — I10 Essential (primary) hypertension: Secondary | ICD-10-CM

## 2023-02-08 DIAGNOSIS — E782 Mixed hyperlipidemia: Secondary | ICD-10-CM | POA: Diagnosis not present

## 2023-02-08 DIAGNOSIS — Z8249 Family history of ischemic heart disease and other diseases of the circulatory system: Secondary | ICD-10-CM

## 2023-02-08 DIAGNOSIS — M791 Myalgia, unspecified site: Secondary | ICD-10-CM | POA: Diagnosis not present

## 2023-02-08 MED ORDER — CARVEDILOL 6.25 MG PO TABS
6.2500 mg | ORAL_TABLET | Freq: Two times a day (BID) | ORAL | 3 refills | Status: DC
  Filled 2023-02-08: qty 180, 90d supply, fill #0
  Filled 2023-07-25 (×2): qty 180, 90d supply, fill #1

## 2023-02-08 MED ORDER — EZETIMIBE 10 MG PO TABS
10.0000 mg | ORAL_TABLET | Freq: Every day | ORAL | 3 refills | Status: DC
  Filled 2023-02-08: qty 90, 90d supply, fill #0
  Filled 2023-07-25 (×2): qty 90, 90d supply, fill #1

## 2023-02-08 NOTE — Patient Instructions (Signed)
Medication Instructions:  START carvedilol 6.25mg  twice daily   Send BP readings via MyChart after 2 weeks on new medication   After 2 weeks, then START zetia 10mg  daily for cholesterol   *If you need a refill on your cardiac medications before your next appointment, please call your pharmacy*   Lab Work: FASTING lab work in 4 months  If you have labs (blood work) drawn today and your tests are completely normal, you will receive your results only by: [employer] (if you have MyChart) OR A paper copy in the mail If you have any lab test that is abnormal or we need to change your treatment, we will call you to review the results.    Follow-Up: At [facility], you and your health needs are our priority.  As part of our continuing mission to provide you with exceptional heart care, we have created designated Provider Care Teams.  These Care Teams include your primary Cardiologist (physician) and Advanced Practice Providers (APPs -  Physician Assistants and Nurse Practitioners) who all work together to provide you with the care you need, when you need it.  We recommend signing up for the patient portal called "MyChart".  Sign up information is provided on this After Visit Summary.  MyChart is used to connect with patients for Virtual Visits (Telemedicine).  Patients are able to view lab/test results, encounter notes, upcoming appointments, etc.  Non-urgent messages can be sent to your provider as well.   To learn more about what you can do with MyChart, go to [email or web address].    Your next appointment:    4 months with Dr. Rennis Golden       Please monitor your BP at home and send readings to Dr. Rennis Golden after 2 weeks  HOW TO TAKE YOUR BLOOD PRESSURE: Rest 5 minutes before taking your blood pressure. Don't smoke or drink caffeinated beverages for at least 30 minutes before. Take your blood pressure before (not after) you eat. Sit comfortably with your back supported  and both feet on the floor (don't cross your legs). Elevate your arm to heart level on a table or a desk. Use the proper sized cuff. It should fit smoothly and snugly around your bare upper arm. There should be enough room to slip a fingertip under the cuff. The bottom edge of the cuff should be 1 inch above the crease of the elbow. Ideally, take 3 measurements at one sitting and record the average.

## 2023-02-08 NOTE — Progress Notes (Signed)
LIPID CLINIC CONSULT NOTE  Chief Complaint:  Manage dyslipidemia and hypertension  Primary Care Physician: [name], [name], NP  Primary Cardiologist:  Charlton Haws, MD  HPI:  Heather Shaw is a 82 y.o. female who is being seen today for the evaluation of dyslipidemia at the request of [name], [name], NP.  This is a pleasant 82 year old female previously seen by Dr. Eden Emms, although recently was left for as needed follow-up.  She has been managed by her primary care provider and has been having difficulty with elevated blood pressure and cholesterol.  Most recent lipid showed total cholesterol 209, triglycerides 179, HDL 42 and LDL 130.  She has not been able to tolerate statin therapy.  In addition, she has been concerned about elevated blood pressures.  They have been rising significantly up into the 180s to 200s systolic.  Her primary care provider recently added hydralazine which has helped bring her blood pressure down however she has had possibly reflex tachycardia with increases in heart rate and pulse rates down to the 90s and 100s.  Blood pressure today was 146/84 and stable in both arms.  PMHx:  Past Medical History:  Diagnosis Date   Arthritis    Bladder pain    Bradycardia    DDD (degenerative disc disease), lumbosacral    Grief 05/24/2018   Hyperlipidemia    Hyperplastic colon polyp    Hypertension    IC (interstitial cystitis)    Insomnia 04/06/2010   Qualifier: Diagnosis of  By: Dayton Martes MD, Talia     Internal hemorrhoids    Polyp of rectum    PONV (postoperative nausea and vomiting)    Right wrist tendonitis 08/24/2017   Squamous papilloma    in esophageal polyp   Uterine fibroid    Vertigo    Wears glasses    Wears partial dentures     Past Surgical History:  Procedure Laterality Date   CARPAL TUNNEL RELEASE Bilateral    COLONOSCOPY W/ POLYPECTOMY  07/27/2007   CYSTO WITH HYDRODISTENSION N/A 05/20/2014   Procedure:  CYSTOSCOPY/HYDRODISTENSION, INSTILLATION OF CHLORPACTIN;  Surgeon: Valetta Fuller, MD;  Location: [facility];  Service: Urology;  Laterality: N/A;   CYSTO/  HYDRODISTENTION/  INSTILLATION CLORPACTIC  09/21/2010   DILATION AND CURETTAGE OF UTERUS  07/26/1986   KNEE ARTHROSCOPY Right 11/18/2009   TRIGGER FINGER RELEASE Right 08/23/2016   Procedure: RIGHT RING FINGER TRIGGER RELEASE;  Surgeon: Mack Hook, MD;  Location: [facility];  Service: Orthopedics;  Laterality: Right;    FAMHx:  Family History  Problem Relation Age of Onset   Clotting disorder Mother 49   COPD Father    Heart attack Father 10   Emphysema Father    Stroke Paternal Aunt    Hypertension Sister    Hypertension Brother     SOCHx:   reports that she quit smoking about 51 years ago. Her smoking use included cigarettes. She has never used smokeless tobacco. She reports that she does not drink alcohol and does not use drugs.  ALLERGIES:  Allergies  Allergen Reactions   Contrast Media [Iodinated Contrast Media]     ? angioedema   Lisinopril     ? angioedema   Allegra [Fexofenadine] Other (See Comments)    Makes pt nervous   Codeine Nausea And Vomiting   Losartan Other (See Comments)    myalgia   Nsaids     "stomach on fire feeling"    Prednisone  Causes elevation in blood pressure- ? All steroids cause same reaction   Pseudoephedrine Hcl Nausea And Vomiting   Zithromax [Azithromycin] Other (See Comments)    Burns stomach   Sulfa Antibiotics Rash   Sulfur Rash    ROS: Pertinent items noted in HPI and remainder of comprehensive ROS otherwise negative.  HOME MEDS: Current Outpatient Medications on File Prior to Visit  Medication Sig Dispense Refill   amLODipine (NORVASC) 10 MG tablet Take 1 tablet (10 mg total) by mouth daily. 90 tablet 3   Calcium Glycerophosphate (PRELIEF PO) Take 2 capsules by mouth daily as needed (uses when eating acitic foods).      fluocinonide-emollient (LIDEX-E) 0.05 % cream Apply 1 application topically 2 (two) times daily. 30 g 0   gabapentin (NEURONTIN) 100 MG capsule Take 2 capsules (200 mg total) by mouth at bedtime. 90 capsule 3   hydrALAZINE (APRESOLINE) 25 MG tablet Take 1 tablet (25 mg total) by mouth 2 (two) times daily. 60 tablet 5   hydrochlorothiazide (HYDRODIURIL) 25 MG tablet Take 1 tablet (25 mg total) by mouth daily. 90 tablet 1   HYDROcodone-acetaminophen (NORCO) 7.5-325 MG tablet Take 1 tablet by mouth every 8 (eight) hours. 90 tablet 0   HYDROcodone-acetaminophen (NORCO) 7.5-325 MG tablet Take 1 tablet by mouth every 8 (eight) hours. 90 tablet 0   [START ON 03/10/2023] HYDROcodone-acetaminophen (NORCO) 7.5-325 MG tablet Take 1 tablet by mouth every 8 (eight) hours. 90 tablet 0   meclizine (ANTIVERT) 25 MG tablet Take 25 mg by mouth 3 (three) times daily as needed for dizziness.     Vitamin D, Ergocalciferol, (DRISDOL) 1.25 MG (50000 UNIT) CAPS capsule Take 1 capsule (50,000 Units total) by mouth every 7 (seven) days. 12 capsule 1   No current facility-administered medications on file prior to visit.    LABS/IMAGING: No results found for this or any previous visit (from the past 48 hour(s)). No results found.  LIPID PANEL:    Component Value Date/Time   CHOL 209 (H) 12/09/2022 1131   TRIG 179.0 (H) 12/09/2022 1131   HDL 42.70 12/09/2022 1131   CHOLHDL 5 12/09/2022 1131   VLDL 35.8 12/09/2022 1131   LDLCALC 130 (H) 12/09/2022 1131   LDLDIRECT 128.0 07/30/2022 0924    WEIGHTS: Wt Readings from Last 3 Encounters:  02/08/23 200 lb 1.6 oz (90.8 kg)  12/09/22 205 lb (93 kg)  10/29/22 198 lb (89.8 kg)    VITALS: BP (!) 146/84   Pulse 91   Ht 5\' 5"  (1.651 m)   Wt 200 lb 1.6 oz (90.8 kg)   SpO2 95%   BMI 33.30 kg/m   EXAM: General appearance: alert and no distress Neck: no carotid bruit, no JVD, thyroid not enlarged, symmetric, no tenderness/mass/nodules, and no subclavian  bruits Lungs: clear to auscultation bilaterally Heart: regular rate and rhythm Pulses: 2+ and symmetric Neurologic: Grossly normal Psych: Pleasant  EKG: Deferred  ASSESSMENT: Mixed dyslipidemia, goal LDL less than 100 Essential hypertension Statin intolerant-myalgias  PLAN: 1.   Ms. Flaim has been intolerant to statins with an elevated cholesterol.  Her LDL is not too bad, but certainly could be lower.  We discussed alternative treatment such as ezetimibe and she was interested in that.  In addition she is concerned about elevated blood pressure.  She is also had tachycardia I think related to recently adding hydralazine.  Nonetheless it has helped her blood pressure.  I would advise adding some beta-blocker therapy since she still not at target.  Will start carvedilol 6.25 mg twice daily.  If this can offset her tachycardia and help with blood pressure then that may be all she needs.  She may also need an increase in her hydralazine to try to target her blood pressures close to 120/80.  I advised her not to start both new medicines at the same time around her to get the blood pressure medicine started first and after a few weeks if she remains asymptomatic with it then she should the ezetimibe.  Plan repeat lipids in about 3 to 4 months and follow-up with me afterwards.  Thanks again for the kind referral.  Chrystie Nose, MD, [facility]  [place]  [facility]  Medical Director of the Advanced Lipid Disorders &  Cardiovascular Risk Reduction Clinic Diplomate of the American Board of Clinical Lipidology Attending Cardiologist  Direct Dial: [phone]  Fax: [phone]  Website:  [email or web address] 02/08/2023, 2:47 PM

## 2023-02-23 DIAGNOSIS — E785 Hyperlipidemia, unspecified: Secondary | ICD-10-CM | POA: Diagnosis not present

## 2023-02-23 DIAGNOSIS — I10 Essential (primary) hypertension: Secondary | ICD-10-CM | POA: Diagnosis not present

## 2023-03-07 ENCOUNTER — Encounter (INDEPENDENT_AMBULATORY_CARE_PROVIDER_SITE_OTHER): Payer: Self-pay | Admitting: Nurse Practitioner

## 2023-03-07 ENCOUNTER — Other Ambulatory Visit (HOSPITAL_COMMUNITY): Payer: Self-pay

## 2023-03-07 DIAGNOSIS — G8929 Other chronic pain: Secondary | ICD-10-CM

## 2023-03-07 DIAGNOSIS — F1120 Opioid dependence, uncomplicated: Secondary | ICD-10-CM

## 2023-03-07 DIAGNOSIS — M5137 Other intervertebral disc degeneration, lumbosacral region: Secondary | ICD-10-CM

## 2023-03-07 DIAGNOSIS — M4807 Spinal stenosis, lumbosacral region: Secondary | ICD-10-CM

## 2023-03-07 MED ORDER — HYDROCODONE-ACETAMINOPHEN 7.5-325 MG PO TABS
1.0000 | ORAL_TABLET | Freq: Three times a day (TID) | ORAL | 0 refills | Status: DC
  Filled 2023-03-09: qty 90, 30d supply, fill #0

## 2023-03-08 ENCOUNTER — Other Ambulatory Visit (HOSPITAL_COMMUNITY): Payer: Self-pay

## 2023-03-09 ENCOUNTER — Other Ambulatory Visit (HOSPITAL_COMMUNITY): Payer: Self-pay

## 2023-03-18 DIAGNOSIS — N3010 Interstitial cystitis (chronic) without hematuria: Secondary | ICD-10-CM | POA: Diagnosis not present

## 2023-03-24 ENCOUNTER — Other Ambulatory Visit: Payer: Self-pay

## 2023-03-24 ENCOUNTER — Other Ambulatory Visit: Payer: Medicare Other

## 2023-03-24 NOTE — Patient Outreach (Signed)
Care Management   Visit Note  03/24/2023 Name: Heather Shaw MRN: [id number] DOB: [birth year]  Subjective: Heather Shaw is a 82 y.o. year old female who is a primary care patient of [name], [name], NP. The Care Management team was consulted for assistance.      Engaged with patient spoke with patient by telephone.    Goals Addressed             This Visit's Progress    RNCM Care Management  Expected Outcome:  Monitor, Self-Manage and Reduce Symptoms of: Chronic Pain       Current Barriers:  Chronic Disease Management support and education needs related to for effective management of Chronic Pain with long term Opioid use  Planned Interventions: Reviewed provider established plan for pain management. The patient has chronic pain, specifically with back pain and arthritis. The patient has long term use of Opioids and sees provider on a regular basis. States that it is hardly ever under a 6. The patient is doing stretches and she also says it is helping her pain level by not taking the statins. The statins are very debilitating to her. She states pain is stable. Reminder given today to follow up with her pcp to make sure she stays in compliance with her medications and follow ups.  Discussed importance of adherence to all scheduled medical appointments. Saw the pcp recently, Saw the cardiologist and will see again in November. She is calling the office after outreach today to secure an appointment with the pcp for follow up.  Counseled on the importance of reporting any/all new or changed pain symptoms or management strategies to pain management provider; Advised patient to report to care team affect of pain on daily activities. The patient states that if she did not have the pain medications she could not function. She is thankful that she has good support system with her providers. ; Discussed use of relaxation techniques and/or diversional activities to assist with pain  reduction (distraction, imagery, relaxation, massage, acupressure, TENS, heat, and cold application. The patient uses heat application and cold application also. The patient states she has a system that works for her well. Education and support given; Reviewed with patient prescribed pharmacological and nonpharmacological pain relief strategies. Review and education provided. Discussed mindfulness and self care. The patient is having a lot of stress in  her life right now related to her son and his drinking.  Advised patient to discuss changes in her level of pain or intensity of pain, or unresolved pain  with provider; Screening for signs and symptoms of depression related to chronic disease state;  Assessed social determinant of health barriers;  Review of Safety and falls prevention with the patient and making sure to be safe and mindful of her surroundings.  Symptom Management: Take medications as prescribed   Attend all scheduled provider appointments Call provider office for new concerns or questions  call the Suicide and Crisis Lifeline: 988 call the [place] National Suicide Prevention Lifeline: 272-453-7946 or TTY: 713-415-9524 TTY 228-728-2401) to talk to a trained counselor call 1-800-273-TALK (toll free, 24 hour hotline) if experiencing a Mental Health or Behavioral Health Crisis   Follow Up Plan: Telephone follow up appointment with care management team member scheduled for: 05-19-2023 at 230 pm       RNCM Care Management  Expected Outcome:  Monitor, Self-Manage, and Reduce Symptoms of Hypertension       Current Barriers:  Chronic Disease Management support and education needs  related to effective management of HTN BP Readings from Last 3 Encounters:  02/08/23 (!) 148/88  12/27/22 (!) 150/84  12/15/22 (!) 150/81  Self reported pressure from this am is: 139/81  Planned Interventions: Evaluation of current treatment plan related to hypertension self management and patient's  adherence to plan as established by provider. The patients blood pressures have been up and down.  The patient has seen the cardiologist and there have been medication changes. She states that her blood pressures are coming down.  Her pressure today was 139/81.   The patient has been taking at home. Education on the benefits of continuation of monitoring of blood pressures and calling the office for abnormal readings. ;   Provided education to patient re: stroke prevention, s/s of heart attack and stroke. Reviewed the risk of heart attack and stroke with uncontrolled blood pressures; Reviewed prescribed diet heart healthy diet. The patient states she has made changes in her eating habits and trying to stay away from sweets. The patient states she is really focusing on dietary changes to help with controlling her blood pressures and cholesterol levels. Will send educational information in the mail to the patient on planning healthy meals. Reviewed medications with patient and discussed importance of compliance. The patient is compliant with medications. States no new concerns at this time with medications. She has started on Carvedilol and this is helping considerably with her blood pressures. ;  Discussed plans with patient for ongoing care management follow up and provided patient with direct contact information for care management team; Advised patient, providing education and rationale, to monitor blood pressure daily and record, calling PCP for findings outside established parameters. She is taking her blood pressures consistently at home and states that her blood pressure today was 139/81.  Reviewed scheduled/upcoming provider appointments including:is calling after outreach today to secure an appointment with the pcp. The patient has a follow up with the cardiologist on 05-31-2023 Advised patient to discuss changes in her blood pressures or heart health with provider; Provided education on prescribed  diet heart healthy diet ;  Discussed complications of poorly controlled blood pressure such as heart disease, stroke, circulatory complications, vision complications, kidney impairment, sexual dysfunction;  Screening for signs and symptoms of depression related to chronic disease state;  Assessed social determinant of health barriers;  Resources provided for help with stress relief. Will attach mindfulness information on to the patients chart as well as support for Al-Anon. The patient also has the [facility] number to call for needs or support in between visits. The patient states her stress level is better. She is focusing on self care and making sure she gets the things she needs.  Symptom Management: Take medications as prescribed   Attend all scheduled provider appointments Call provider office for new concerns or questions  call the Suicide and Crisis Lifeline: 988 call the [place] National Suicide Prevention Lifeline: 747-593-6839 or TTY: 856-286-2412 TTY 252-837-9809) to talk to a trained counselor call 1-800-273-TALK (toll free, 24 hour hotline) if experiencing a Mental Health or Behavioral Health Crisis  check blood pressure 3 times per week learn about high blood pressure keep a blood pressure log take blood pressure log to all doctor appointments call doctor for signs and symptoms of high blood pressure develop an action plan for high blood pressure keep all doctor appointments take medications for blood pressure exactly as prescribed report new symptoms to your doctor  Follow Up Plan: Telephone follow up appointment with care management team member  scheduled for: 05-19-2023 at 230 pm       RNCM Care Management Expected Outcome:  Monitor, Self-Manage and Reduce Symptoms of:HLD       Current Barriers:  Knowledge Deficits related to medications to help with HLD and dietary help with maintaining healthy cholesterol levels Chronic Disease Management support and education needs related  to effective management of HLD Lab Results  Component Value Date   CHOL 209 (H) 12/09/2022   HDL 42.70 12/09/2022   LDLCALC 130 (H) 12/09/2022   LDLDIRECT 128.0 07/30/2022   TRIG 179.0 (H) 12/09/2022   CHOLHDL 5 12/09/2022     Planned Interventions: Provider established cholesterol goals reviewed. Review of goals of cholesterol. The patient states she knows her levels are out of range. She states she just cannot take the statin's they make her feel terrible. She is changing her dietary habits and this is helping she feels. She saw the cardiologist in July and will see again in November. She is taking Zetia and when they do more labwork she will be able to see if the addition of the Zetia is working. The patient denies any acute findings today. Education and support given.  Counseled on importance of regular laboratory monitoring as prescribed. Review of having regular lab work to check levels. Review of levels and she has had some drops in the levels and is thankful for this. The patient is keeping her appointment with the lipid clinic ; Provided HLD educational materials. Sending information on HLD and planning health meals in the mail to the patient. ; Reviewed role and benefits of statin for ASCVD risk reduction. The patient tried the Lipitor and the fenofibrate and both of these made her feel terrible. She told the pcp that she could not tolerate this. She is making dietary changes. She also states that she is going to a Lipid clinic due to her high cholesterol but the earliest they can see her is July.  Discussed strategies to manage statin-induced myalgias. The patient has tried different statins but still has issues with myalgias and pain. She is wanting to see what other options there are. Education and support given.; Reviewed importance of limiting foods high in cholesterol. Review and education will send information by My Chart for the patient. ; Screening for signs and symptoms of  depression related to chronic disease state;  Assessed social determinant of health barriers;   Symptom Management: Take medications as prescribed   Attend all scheduled provider appointments Call provider office for new concerns or questions  call the Suicide and Crisis Lifeline: 988 call the [place] National Suicide Prevention Lifeline: 509-172-1916 or TTY: (801)761-1866 TTY (709)504-6028) to talk to a trained counselor call 1-800-273-TALK (toll free, 24 hour hotline) if experiencing a Mental Health or Behavioral Health Crisis  - call for medicine refill 2 or 3 days before it runs out - take all medications exactly as prescribed - call doctor with any symptoms you believe are related to your medicine - call doctor when you experience any new symptoms - go to all doctor appointments as scheduled - adhere to prescribed diet: Heart healthy diet   Follow Up Plan: Telephone follow up appointment with care management team member scheduled for: 05-19-2023 at 230 pm         :     Consent to Services:  Patient was given information about care management services, agreed to services, and gave verbal consent to participate.   Plan: Telephone follow up appointment with care management team  member scheduled for: 05-19-2023 at 230 pm  Alto Denver RN, MSN, CCM RN Care Manager  [facility]  Ambulatory Care Management  Direct Number: [phone]

## 2023-03-24 NOTE — Patient Instructions (Signed)
Visit Information  Thank you for taking time to visit with me today. Please don't hesitate to contact me if I can be of assistance to you before our next scheduled telephone appointment.  Following are the goals we discussed today:   Goals Addressed             This Visit's Progress    RNCM Care Management  Expected Outcome:  Monitor, Self-Manage and Reduce Symptoms of: Chronic Pain       Current Barriers:  Chronic Disease Management support and education needs related to for effective management of Chronic Pain with long term Opioid use  Planned Interventions: Reviewed provider established plan for pain management. The patient has chronic pain, specifically with back pain and arthritis. The patient has long term use of Opioids and sees provider on a regular basis. States that it is hardly ever under a 6. The patient is doing stretches and she also says it is helping her pain level by not taking the statins. The statins are very debilitating to her. She states pain is stable. Reminder given today to follow up with her pcp to make sure she stays in compliance with her medications and follow ups.  Discussed importance of adherence to all scheduled medical appointments. Saw the pcp recently, Saw the cardiologist and will see again in November. She is calling the office after outreach today to secure an appointment with the pcp for follow up.  Counseled on the importance of reporting any/all new or changed pain symptoms or management strategies to pain management provider; Advised patient to report to care team affect of pain on daily activities. The patient states that if she did not have the pain medications she could not function. She is thankful that she has good support system with her providers. ; Discussed use of relaxation techniques and/or diversional activities to assist with pain reduction (distraction, imagery, relaxation, massage, acupressure, TENS, heat, and cold application. The  patient uses heat application and cold application also. The patient states she has a system that works for her well. Education and support given; Reviewed with patient prescribed pharmacological and nonpharmacological pain relief strategies. Review and education provided. Discussed mindfulness and self care. The patient is having a lot of stress in  her life right now related to her son and his drinking.  Advised patient to discuss changes in her level of pain or intensity of pain, or unresolved pain  with provider; Screening for signs and symptoms of depression related to chronic disease state;  Assessed social determinant of health barriers;  Review of Safety and falls prevention with the patient and making sure to be safe and mindful of her surroundings.  Symptom Management: Take medications as prescribed   Attend all scheduled provider appointments Call provider office for new concerns or questions  call the Suicide and Crisis Lifeline: 988 call the [place] National Suicide Prevention Lifeline: 423 664 9343 or TTY: (516)739-9762 TTY 2895238798) to talk to a trained counselor call 1-800-273-TALK (toll free, 24 hour hotline) if experiencing a Mental Health or Behavioral Health Crisis   Follow Up Plan: Telephone follow up appointment with care management team member scheduled for: 05-19-2023 at 230 pm       RNCM Care Management  Expected Outcome:  Monitor, Self-Manage, and Reduce Symptoms of Hypertension       Current Barriers:  Chronic Disease Management support and education needs related to effective management of HTN BP Readings from Last 3 Encounters:  02/08/23 (!) 148/88  12/27/22 (!) 150/84  12/15/22 (!) 150/81  Self reported pressure from this am is: 139/81  Planned Interventions: Evaluation of current treatment plan related to hypertension self management and patient's adherence to plan as established by provider. The patients blood pressures have been up and down.  The  patient has seen the cardiologist and there have been medication changes. She states that her blood pressures are coming down.  Her pressure today was 139/81.   The patient has been taking at home. Education on the benefits of continuation of monitoring of blood pressures and calling the office for abnormal readings. ;   Provided education to patient re: stroke prevention, s/s of heart attack and stroke. Reviewed the risk of heart attack and stroke with uncontrolled blood pressures; Reviewed prescribed diet heart healthy diet. The patient states she has made changes in her eating habits and trying to stay away from sweets. The patient states she is really focusing on dietary changes to help with controlling her blood pressures and cholesterol levels. Will send educational information in the mail to the patient on planning healthy meals. Reviewed medications with patient and discussed importance of compliance. The patient is compliant with medications. States no new concerns at this time with medications. She has started on Carvedilol and this is helping considerably with her blood pressures. ;  Discussed plans with patient for ongoing care management follow up and provided patient with direct contact information for care management team; Advised patient, providing education and rationale, to monitor blood pressure daily and record, calling PCP for findings outside established parameters. She is taking her blood pressures consistently at home and states that her blood pressure today was 139/81.  Reviewed scheduled/upcoming provider appointments including:is calling after outreach today to secure an appointment with the pcp. The patient has a follow up with the cardiologist on 05-31-2023 Advised patient to discuss changes in her blood pressures or heart health with provider; Provided education on prescribed diet heart healthy diet ;  Discussed complications of poorly controlled blood pressure such as heart  disease, stroke, circulatory complications, vision complications, kidney impairment, sexual dysfunction;  Screening for signs and symptoms of depression related to chronic disease state;  Assessed social determinant of health barriers;  Resources provided for help with stress relief. Will attach mindfulness information on to the patients chart as well as support for [employer]. The patient also has the [facility] number to call for needs or support in between visits. The patient states her stress level is better. She is focusing on self care and making sure she gets the things she needs.  Symptom Management: Take medications as prescribed   Attend all scheduled provider appointments Call provider office for new concerns or questions  call the Suicide and Crisis Lifeline: 988 call the [place] National Suicide Prevention Lifeline: 604-712-8507 or TTY: (346) 732-1757 TTY 248-678-1516) to talk to a trained counselor call 1-800-273-TALK (toll free, 24 hour hotline) if experiencing a Mental Health or Behavioral Health Crisis  check blood pressure 3 times per week learn about high blood pressure keep a blood pressure log take blood pressure log to all doctor appointments call doctor for signs and symptoms of high blood pressure develop an action plan for high blood pressure keep all doctor appointments take medications for blood pressure exactly as prescribed report new symptoms to your doctor  Follow Up Plan: Telephone follow up appointment with care management team member scheduled for: 05-19-2023 at 230 pm       RNCM Care Management Expected Outcome:  Monitor, Self-Manage and  Reduce Symptoms of:HLD       Current Barriers:  Knowledge Deficits related to medications to help with HLD and dietary help with maintaining healthy cholesterol levels Chronic Disease Management support and education needs related to effective management of HLD Lab Results  Component Value Date   CHOL 209 (H) 12/09/2022   HDL  42.70 12/09/2022   LDLCALC 130 (H) 12/09/2022   LDLDIRECT 128.0 07/30/2022   TRIG 179.0 (H) 12/09/2022   CHOLHDL 5 12/09/2022     Planned Interventions: Provider established cholesterol goals reviewed. Review of goals of cholesterol. The patient states she knows her levels are out of range. She states she just cannot take the statin's they make her feel terrible. She is changing her dietary habits and this is helping she feels. She saw the cardiologist in July and will see again in November. She is taking Zetia and when they do more labwork she will be able to see if the addition of the Zetia is working. The patient denies any acute findings today. Education and support given.  Counseled on importance of regular laboratory monitoring as prescribed. Review of having regular lab work to check levels. Review of levels and she has had some drops in the levels and is thankful for this. The patient is keeping her appointment with the lipid clinic ; Provided HLD educational materials. Sending information on HLD and planning health meals in the mail to the patient. ; Reviewed role and benefits of statin for ASCVD risk reduction. The patient tried the Lipitor and the fenofibrate and both of these made her feel terrible. She told the pcp that she could not tolerate this. She is making dietary changes. She also states that she is going to a Lipid clinic due to her high cholesterol but the earliest they can see her is July.  Discussed strategies to manage statin-induced myalgias. The patient has tried different statins but still has issues with myalgias and pain. She is wanting to see what other options there are. Education and support given.; Reviewed importance of limiting foods high in cholesterol. Review and education will send information by My Chart for the patient. ; Screening for signs and symptoms of depression related to chronic disease state;  Assessed social determinant of health barriers;   Symptom  Management: Take medications as prescribed   Attend all scheduled provider appointments Call provider office for new concerns or questions  call the Suicide and Crisis Lifeline: 988 call the [place] National Suicide Prevention Lifeline: 832-250-3571 or TTY: (201) 490-9591 TTY 626-604-5414) to talk to a trained counselor call 1-800-273-TALK (toll free, 24 hour hotline) if experiencing a Mental Health or Behavioral Health Crisis  - call for medicine refill 2 or 3 days before it runs out - take all medications exactly as prescribed - call doctor with any symptoms you believe are related to your medicine - call doctor when you experience any new symptoms - go to all doctor appointments as scheduled - adhere to prescribed diet: Heart healthy diet   Follow Up Plan: Telephone follow up appointment with care management team member scheduled for: 05-19-2023 at 230 pm           Our next appointment is by telephone on 05-19-2023 at 230 pm  Please call the care guide team at [phone] if you need to cancel or reschedule your appointment.   If you are experiencing a Mental Health or Behavioral Health Crisis or need someone to talk to, please call the Suicide and Crisis Lifeline: 667-768-6053  call the [place] National Suicide Prevention Lifeline: (604) 081-4537 or TTY: 934-220-9356 TTY (516) 840-0729) to talk to a trained counselor call 1-800-273-TALK (toll free, 24 hour hotline) go to [facility] [address] [place] [phone])   Patient verbalizes understanding of instructions and care plan provided today and agrees to view in MyChart. Active MyChart status and patient understanding of how to access instructions and care plan via MyChart confirmed with patient.     Telephone follow up appointment with care management team member scheduled for: 05-19-2023 at 230 pm  Alto Denver RN, MSN, CCM RN Care Manager  [facility]  Ambulatory Care Management  Direct  Number: [phone]

## 2023-03-25 ENCOUNTER — Other Ambulatory Visit: Payer: Self-pay | Admitting: Nurse Practitioner

## 2023-03-25 DIAGNOSIS — L209 Atopic dermatitis, unspecified: Secondary | ICD-10-CM

## 2023-03-29 ENCOUNTER — Other Ambulatory Visit (HOSPITAL_COMMUNITY): Payer: Self-pay

## 2023-03-29 MED ORDER — FLUOCINONIDE EMULSIFIED BASE 0.05 % EX CREA
1.0000 | TOPICAL_CREAM | Freq: Two times a day (BID) | CUTANEOUS | 0 refills | Status: AC | PRN
  Filled 2023-03-29: qty 30, 15d supply, fill #0

## 2023-03-30 ENCOUNTER — Other Ambulatory Visit (HOSPITAL_COMMUNITY): Payer: Self-pay

## 2023-03-31 ENCOUNTER — Other Ambulatory Visit (HOSPITAL_COMMUNITY): Payer: Self-pay

## 2023-04-06 ENCOUNTER — Other Ambulatory Visit (HOSPITAL_COMMUNITY): Payer: Self-pay

## 2023-04-06 DIAGNOSIS — G8929 Other chronic pain: Secondary | ICD-10-CM

## 2023-04-06 MED ORDER — HYDROCODONE-ACETAMINOPHEN 7.5-325 MG PO TABS
1.0000 | ORAL_TABLET | Freq: Three times a day (TID) | ORAL | 0 refills | Status: DC
  Filled 2023-04-07: qty 90, 30d supply, fill #0

## 2023-04-06 MED ORDER — TIZANIDINE HCL 4 MG PO TABS
4.0000 mg | ORAL_TABLET | Freq: Two times a day (BID) | ORAL | 0 refills | Status: DC | PRN
  Filled 2023-04-06: qty 6, 3d supply, fill #0

## 2023-04-06 NOTE — Addendum Note (Signed)
Addended by: Alysia Penna L on: 04/06/2023 02:42 PM   Modules accepted: Orders

## 2023-04-06 NOTE — Telephone Encounter (Signed)
Please see the MyChart message reply(ies) for my assessment and plan.  The patient gave consent for this Medical Advice Message and is aware that it may result in a bill to their insurance company as well as the possibility that this may result in a co-payment or deductible. They are an established patient, but are not seeking medical advice exclusively about a problem treated during an in person or video visit in the last 7 days. I did not recommend an in person or video visit within 7 days of my reply.  I spent a total of 10 minutes cumulative time within 7 days through MyChart messaging Charlotte [name], NP  

## 2023-04-07 ENCOUNTER — Ambulatory Visit: Payer: Medicare Other | Admitting: Nurse Practitioner

## 2023-04-07 ENCOUNTER — Other Ambulatory Visit (HOSPITAL_COMMUNITY): Payer: Self-pay

## 2023-04-15 ENCOUNTER — Other Ambulatory Visit: Payer: Self-pay | Admitting: Nurse Practitioner

## 2023-04-15 ENCOUNTER — Ambulatory Visit (INDEPENDENT_AMBULATORY_CARE_PROVIDER_SITE_OTHER): Payer: Medicare Other | Admitting: Nurse Practitioner

## 2023-04-15 ENCOUNTER — Other Ambulatory Visit (HOSPITAL_COMMUNITY): Payer: Self-pay

## 2023-04-15 ENCOUNTER — Encounter: Payer: Self-pay | Admitting: Nurse Practitioner

## 2023-04-15 VITALS — BP 138/86 | HR 70 | Temp 97.5°F | Wt 198.8 lb

## 2023-04-15 DIAGNOSIS — M5137 Other intervertebral disc degeneration, lumbosacral region: Secondary | ICD-10-CM | POA: Diagnosis not present

## 2023-04-15 DIAGNOSIS — I10 Essential (primary) hypertension: Secondary | ICD-10-CM | POA: Diagnosis not present

## 2023-04-15 DIAGNOSIS — G8929 Other chronic pain: Secondary | ICD-10-CM

## 2023-04-15 DIAGNOSIS — E559 Vitamin D deficiency, unspecified: Secondary | ICD-10-CM

## 2023-04-15 DIAGNOSIS — M5416 Radiculopathy, lumbar region: Secondary | ICD-10-CM | POA: Diagnosis not present

## 2023-04-15 DIAGNOSIS — M51379 Other intervertebral disc degeneration, lumbosacral region without mention of lumbar back pain or lower extremity pain: Secondary | ICD-10-CM

## 2023-04-15 DIAGNOSIS — R739 Hyperglycemia, unspecified: Secondary | ICD-10-CM

## 2023-04-15 DIAGNOSIS — M4807 Spinal stenosis, lumbosacral region: Secondary | ICD-10-CM

## 2023-04-15 DIAGNOSIS — F1120 Opioid dependence, uncomplicated: Secondary | ICD-10-CM

## 2023-04-15 LAB — HEMOGLOBIN A1C: Hgb A1c MFr Bld: 5.8 % (ref 4.6–6.5)

## 2023-04-15 LAB — RENAL FUNCTION PANEL
Albumin: 4.3 g/dL (ref 3.5–5.2)
BUN: 15 mg/dL (ref 6–23)
CO2: 27 mEq/L (ref 19–32)
Calcium: 9.7 mg/dL (ref 8.4–10.5)
Chloride: 104 mEq/L (ref 96–112)
Creatinine, Ser: 0.8 mg/dL (ref 0.40–1.20)
GFR: 69.69 mL/min (ref >60.00)
Glucose, Bld: 111 mg/dL — ABNORMAL HIGH (ref 70–99)
Phosphorus: 3.7 mg/dL (ref 2.3–4.6)
Potassium: 4.3 mEq/L (ref 3.5–5.1)
Sodium: 141 mEq/L (ref 135–145)

## 2023-04-15 LAB — VITAMIN D 25 HYDROXY (VIT D DEFICIENCY, FRACTURES): VITD: 23.66 ng/mL — ABNORMAL LOW (ref 30.00–100.00)

## 2023-04-15 MED ORDER — HYDRALAZINE HCL 25 MG PO TABS
25.0000 mg | ORAL_TABLET | Freq: Two times a day (BID) | ORAL | 3 refills | Status: DC
Start: 2023-04-15 — End: 2024-03-14
  Filled 2023-04-15 – 2023-05-13 (×2): qty 180, 90d supply, fill #0
  Filled 2023-09-01: qty 180, 90d supply, fill #1
  Filled 2023-12-23: qty 180, 90d supply, fill #2

## 2023-04-15 MED ORDER — HYDROCODONE-ACETAMINOPHEN 7.5-325 MG PO TABS
1.0000 | ORAL_TABLET | Freq: Three times a day (TID) | ORAL | 0 refills | Status: DC
Start: 2023-05-06 — End: 2023-04-15

## 2023-04-15 MED ORDER — GABAPENTIN 100 MG PO CAPS
200.0000 mg | ORAL_CAPSULE | Freq: Every day | ORAL | 3 refills | Status: DC
  Filled 2023-04-15: qty 90, 45d supply, fill #0
  Filled 2023-07-11 – 2023-07-25 (×2): qty 90, 45d supply, fill #1
  Filled 2023-10-03: qty 90, 45d supply, fill #2
  Filled 2023-12-23: qty 90, 45d supply, fill #3

## 2023-04-15 MED ORDER — VITAMIN D3 50 MCG (2000 UT) PO CAPS: 2000.0000 [IU] | ORAL_CAPSULE | Freq: Every day | ORAL | Status: DC

## 2023-04-15 MED ORDER — HYDROCODONE-ACETAMINOPHEN 7.5-325 MG PO TABS: 1.0000 | ORAL_TABLET | Freq: Three times a day (TID) | ORAL | 0 refills | Status: DC

## 2023-04-15 MED ORDER — AMLODIPINE BESYLATE 10 MG PO TABS
10.0000 mg | ORAL_TABLET | Freq: Every day | ORAL | 3 refills | Status: DC
Start: 2023-04-15 — End: 2024-03-14
  Filled 2023-04-15 – 2023-07-04 (×2): qty 90, 90d supply, fill #0
  Filled 2023-10-13: qty 90, 90d supply, fill #1
  Filled 2024-01-17: qty 90, 90d supply, fill #2

## 2023-04-15 NOTE — Progress Notes (Signed)
Established Patient Visit  Patient: Heather Shaw   DOB: Nov 30, 1940   82 y.o. Female  MRN: [id number] Visit Date: 04/15/2023  Subjective:    Chief Complaint  Patient presents with   Medical Management of Chronic Issues    3 month f/u b/p. Rx refill gabapentin. Vitamin D discuss if she still needs to take.    HPI Hypertensive disorder BP mildly elevated due to non compliance with hydrochlorothiazide dose Reports she is compliant with coreg, amlodipine and coreg doses BP Readings from Last 3 Encounters:  04/15/23 138/86  02/08/23 (!) 148/88  12/27/22 (!) 150/84    Advised about the importance of med compliance and possible complications from uncontrolled HYPERTENSION. Repeat BMP F/up in 77month  Chronic pain Renewed opioid contract today. UTD with UDS Pain controlled with current dose of hydrocodone 7.5/325mg  1tab TID and gabapentin 200mg  No red flag in PMP database: last filled 04/07/23, 03/09/23, 02/07/23 Denies any constipation or daytime somnolence or confusion She lives alone and independent with ADLs: able to drive No change in med Refilled sent for 05/06/23 and 06/06/2023 F/up in 77months  Reviewed medical, surgical, and social history today  Medications: Outpatient Medications Prior to Visit  Medication Sig   Calcium Glycerophosphate (PRELIEF PO) Take 2 capsules by mouth daily as needed (uses when eating acitic foods).   carvedilol (COREG) 6.25 MG tablet Take 1 tablet (6.25 mg total) by mouth 2 (two) times daily.   ezetimibe (ZETIA) 10 MG tablet Take 1 tablet (10 mg total) by mouth daily.   fluocinonide-emollient (LIDEX-E) 0.05 % cream Apply 1 Application topically 2 (two) times daily as needed.   hydrochlorothiazide (HYDRODIURIL) 25 MG tablet Take 1 tablet (25 mg total) by mouth daily.   meclizine (ANTIVERT) 25 MG tablet Take 25 mg by mouth 3 (three) times daily as needed for dizziness.   tiZANidine (ZANAFLEX) 4 MG tablet Take 1 tablet (4 mg total)  by mouth every 12 (twelve) hours as needed for muscle spasms.   Vitamin D, Ergocalciferol, (DRISDOL) 1.25 MG (50000 UNIT) CAPS capsule Take 1 capsule (50,000 Units total) by mouth every 7 (seven) days.   [DISCONTINUED] amLODipine (NORVASC) 10 MG tablet Take 1 tablet (10 mg total) by mouth daily.   [DISCONTINUED] gabapentin (NEURONTIN) 100 MG capsule Take 2 capsules (200 mg total) by mouth at bedtime.   [DISCONTINUED] hydrALAZINE (APRESOLINE) 25 MG tablet Take 1 tablet (25 mg total) by mouth 2 (two) times daily.   [DISCONTINUED] HYDROcodone-acetaminophen (NORCO) 7.5-325 MG tablet Take 1 tablet by mouth every 8 (eight) hours.   No facility-administered medications prior to visit.   Reviewed past medical and social history.   ROS per HPI above      Objective:  BP 138/86 (BP Location: Right Arm, Patient Position: Sitting, Cuff Size: Large)   Pulse 70   Temp (!) 97.5 F (36.4 C) (Temporal)   Wt 198 lb 12.8 oz (90.2 kg)   SpO2 96%   BMI 33.08 kg/m      Physical Exam Cardiovascular:     Rate and Rhythm: Normal rate and regular rhythm.     Pulses: Normal pulses.     Heart sounds: Normal heart sounds.  Pulmonary:     Effort: Pulmonary effort is normal.     Breath sounds: Normal breath sounds.  Neurological:     Mental Status: She is alert and oriented to person, place, and time.  Psychiatric:  Mood and Affect: Mood normal.        Behavior: Behavior normal.        Thought Content: Thought content normal.     No results found for any visits on 04/15/23.    Assessment & Plan:    Problem List Items Addressed This Visit     Chronic pain    Renewed opioid contract today. UTD with UDS Pain controlled with current dose of hydrocodone 7.5/325mg  1tab TID and gabapentin 200mg  No red flag in PMP database: last filled 04/07/23, 03/09/23, 02/07/23 Denies any constipation or daytime somnolence or confusion She lives alone and independent with ADLs: able to drive No change in  med Refilled sent for 05/06/23 and 06/06/2023 F/up in 70months      Relevant Medications   gabapentin (NEURONTIN) 100 MG capsule   HYDROcodone-acetaminophen (NORCO) 7.5-325 MG tablet (Start on 06/06/2023)   Hyperglycemia   Relevant Orders   Hemoglobin A1c   Hypertensive disorder - Primary    BP mildly elevated due to non compliance with hydrochlorothiazide dose Reports she is compliant with coreg, amlodipine and coreg doses BP Readings from Last 3 Encounters:  04/15/23 138/86  02/08/23 (!) 148/88  12/27/22 (!) 150/84    Advised about the importance of med compliance and possible complications from uncontrolled HYPERTENSION. Repeat BMP F/up in 70month      Relevant Medications   amLODipine (NORVASC) 10 MG tablet   hydrALAZINE (APRESOLINE) 25 MG tablet   Other Relevant Orders   Renal Function Panel   Lumbar radiculopathy, chronic   Relevant Medications   gabapentin (NEURONTIN) 100 MG capsule   Opioid type dependence, continuous (HCC)   Relevant Medications   gabapentin (NEURONTIN) 100 MG capsule   HYDROcodone-acetaminophen (NORCO) 7.5-325 MG tablet (Start on 06/06/2023)   Vitamin D deficiency   Relevant Orders   VITAMIN D 25 Hydroxy (Vit-D Deficiency, Fractures)   Other Visit Diagnoses     DDD (degenerative disc disease), lumbosacral       Relevant Medications   gabapentin (NEURONTIN) 100 MG capsule   HYDROcodone-acetaminophen (NORCO) 7.5-325 MG tablet (Start on 06/06/2023)   Spinal stenosis of lumbosacral region       Relevant Medications   HYDROcodone-acetaminophen (NORCO) 7.5-325 MG tablet (Start on 06/06/2023)      Return in about 3 months (around 07/15/2023) for HTN and chronic pain management.     Alysia Penna, NP

## 2023-04-15 NOTE — Assessment & Plan Note (Signed)
BP mildly elevated due to non compliance with hydrochlorothiazide dose Reports she is compliant with coreg, amlodipine and coreg doses BP Readings from Last 3 Encounters:  04/15/23 138/86  02/08/23 (!) 148/88  12/27/22 (!) 150/84    Advised about the importance of med compliance and possible complications from uncontrolled HYPERTENSION. Repeat BMP F/up in 61month

## 2023-04-15 NOTE — Assessment & Plan Note (Signed)
Repeat hgbA1c

## 2023-04-15 NOTE — Assessment & Plan Note (Addendum)
Renewed opioid contract today. UTD with UDS Pain controlled with current dose of hydrocodone 7.5/325mg  1tab TID and gabapentin 200mg  No red flag in PMP database: last filled 04/07/23, 03/09/23, 02/07/23 Denies any constipation or daytime somnolence or confusion She lives alone and independent with ADLs: able to drive No change in med Refilled sent for 05/06/23 and 06/06/2023 F/up in 3months

## 2023-04-15 NOTE — Patient Instructions (Signed)
Go to lab Take all meds as prescribed

## 2023-05-02 ENCOUNTER — Emergency Department
Admission: EM | Admit: 2023-05-02 | Discharge: 2023-05-02 | Disposition: A | Discharge status: Home / Self Care | Payer: Medicare Other | Attending: Emergency Medicine | Admitting: Emergency Medicine

## 2023-05-02 ENCOUNTER — Other Ambulatory Visit: Payer: Self-pay

## 2023-05-02 ENCOUNTER — Emergency Department: Payer: Medicare Other

## 2023-05-02 DIAGNOSIS — R1030 Lower abdominal pain, unspecified: Secondary | ICD-10-CM | POA: Diagnosis not present

## 2023-05-02 DIAGNOSIS — R1084 Generalized abdominal pain: Secondary | ICD-10-CM | POA: Insufficient documentation

## 2023-05-02 DIAGNOSIS — R109 Unspecified abdominal pain: Secondary | ICD-10-CM | POA: Diagnosis not present

## 2023-05-02 DIAGNOSIS — R110 Nausea: Secondary | ICD-10-CM | POA: Insufficient documentation

## 2023-05-02 DIAGNOSIS — R000 Tachycardia, unspecified: Secondary | ICD-10-CM | POA: Diagnosis not present

## 2023-05-02 DIAGNOSIS — K5730 Diverticulosis of large intestine without perforation or abscess without bleeding: Secondary | ICD-10-CM | POA: Diagnosis not present

## 2023-05-02 LAB — COMPREHENSIVE METABOLIC PANEL
ALT: 12 U/L (ref 0–44)
AST: 18 U/L (ref 15–41)
Albumin: 4.5 g/dL (ref 3.5–5.0)
Alkaline Phosphatase: 58 U/L (ref 38–126)
Anion gap: 12 (ref 5–15)
BUN: 13 mg/dL (ref 8–23)
CO2: 22 mmol/L (ref 22–32)
Calcium: 9.6 mg/dL (ref 8.9–10.3)
Chloride: 103 mmol/L (ref 98–111)
Creatinine, Ser: 0.64 mg/dL (ref 0.44–1.00)
GFR, Estimated: >60 mL/min (ref >60)
Glucose, Bld: 126 mg/dL — ABNORMAL HIGH (ref 70–99)
Potassium: 3.3 mmol/L — ABNORMAL LOW (ref 3.5–5.1)
Sodium: 137 mmol/L (ref 135–145)
Total Bilirubin: 0.8 mg/dL (ref 0.3–1.2)
Total Protein: 7.8 g/dL (ref 6.5–8.1)

## 2023-05-02 LAB — URINALYSIS, ROUTINE W REFLEX MICROSCOPIC
Bilirubin Urine: NEGATIVE
Glucose, UA: NEGATIVE mg/dL
Hgb urine dipstick: NEGATIVE
Ketones, ur: NEGATIVE mg/dL
Leukocytes,Ua: NEGATIVE
Nitrite: NEGATIVE
Protein, ur: NEGATIVE mg/dL
Specific Gravity, Urine: 1.008 (ref 1.005–1.030)
pH: 6 (ref 5.0–8.0)

## 2023-05-02 LAB — LIPASE, BLOOD: Lipase: 35 U/L (ref 11–51)

## 2023-05-02 LAB — CBC
HCT: 45 % (ref 36.0–46.0)
Hemoglobin: 15.5 g/dL — ABNORMAL HIGH (ref 12.0–15.0)
MCH: 28.6 pg (ref 26.0–34.0)
MCHC: 34.4 g/dL (ref 30.0–36.0)
MCV: 83 fL (ref 80.0–100.0)
Platelets: 251 10*3/uL (ref 150–400)
RBC: 5.42 MIL/uL — ABNORMAL HIGH (ref 3.87–5.11)
RDW: 12.6 % (ref 11.5–15.5)
WBC: 9.2 10*3/uL (ref 4.0–10.5)
nRBC: 0 % (ref 0.0–0.2)

## 2023-05-02 LAB — LACTIC ACID, PLASMA: Lactic Acid, Venous: 1.3 mmol/L (ref 0.5–1.9)

## 2023-05-02 MED ORDER — MORPHINE SULFATE (PF) 4 MG/ML IV SOLN
4.0000 mg | Freq: Once | INTRAVENOUS | Status: AC
  Administered 2023-05-02: 4 mg via INTRAVENOUS
  Filled 2023-05-02: qty 1

## 2023-05-02 MED ORDER — SODIUM CHLORIDE 0.9 % IV BOLUS
500.0000 mL | Freq: Once | INTRAVENOUS | Status: AC
  Administered 2023-05-02: 500 mL via INTRAVENOUS

## 2023-05-02 MED ORDER — ONDANSETRON HCL 4 MG/2ML IJ SOLN
4.0000 mg | INTRAMUSCULAR | Status: AC
  Administered 2023-05-02: 4 mg via INTRAVENOUS
  Filled 2023-05-02: qty 2

## 2023-05-02 NOTE — ED Triage Notes (Signed)
Pt reports generalized abdominal pain for the last two days with pain radiating to lower back. Pt reports pain with urination as well and some nausea.

## 2023-05-02 NOTE — Discharge Instructions (Signed)

## 2023-05-02 NOTE — ED Provider Notes (Signed)
[facility] Provider Note    Event Date/Time   First MD Initiated Contact with Patient 05/02/23 [phone]     (approximate)   History   Abdominal Pain   HPI Heather Shaw is a 82 y.o. female who presents for evaluation of gradually worsening lower abdominal pain over the last couple of days.  She said that she is on a "pain regimen" and that she has interstitial cystitis, but this feels completely different.  She has constant both aching and sharp pain in her lower abdomen and nothing in particular makes it better or worse.  She has had nausea but no vomiting.  She said that she has had a bowel movement once a day for the last couple of days and it looked green and was "getting a little soft".  However she has had no constipation nor episodes of diarrhea except as previously described.  No new or worsening dysuria compared to baseline.  Denies fever, chest pain, and shortness of breath.     Physical Exam   Triage Vital Signs: ED Triage Vitals  Encounter Vitals Group     BP 05/02/23 0401 (!) 175/96     Systolic BP Percentile --      Diastolic BP Percentile --      Pulse Rate 05/02/23 0401 (!) 104     Resp 05/02/23 0401 20     Temp 05/02/23 0359 98.1 F (36.7 C)     Temp src --      SpO2 05/02/23 0401 94 %     Weight 05/02/23 0359 89.8 kg (198 lb)     Height 05/02/23 0359 1.651 m (5\' 5" )     Head Circumference --      Peak Flow --      Pain Score 05/02/23 0359 9     Pain Loc --      Pain Education --      Exclude from Growth Chart --     Most recent vital signs: Vitals:   05/02/23 0401 05/02/23 0500  BP: (!) 175/96 (!) 161/84  Pulse: (!) 104 (!) 51  Resp: 20 (!) 8  Temp:    SpO2: 94%     General: Awake, appears very uncomfortable and is holding her abdomen. CV:  Good peripheral perfusion.  Mild tachycardia, regular rhythm. Resp:  Normal effort. Speaking easily and comfortably, no accessory muscle usage nor intercostal retractions.    Abd:  No distention.  Mild generalized tenderness to palpation throughout the lower abdomen with no localized peritonitis and no rebound or guarding.   ED Results / Procedures / Treatments   Labs (all labs ordered are listed, but only abnormal results are displayed) Labs Reviewed  COMPREHENSIVE METABOLIC PANEL - Abnormal; Notable for the following components:      Result Value   Potassium 3.3 (*)    Glucose, Bld 126 (*)    All other components within normal limits  CBC - Abnormal; Notable for the following components:   RBC 5.42 (*)    Hemoglobin 15.5 (*)    All other components within normal limits  URINALYSIS, ROUTINE W REFLEX MICROSCOPIC - Abnormal; Notable for the following components:   Color, Urine STRAW (*)    APPearance CLEAR (*)    All other components within normal limits  LIPASE, BLOOD  LACTIC ACID, PLASMA     EKG  ED ECG REPORT I, Loleta Rose, the attending physician, personally viewed and interpreted this ECG.  Date: 05/02/2023 EKG Time:  4:14 AM Rate: 88 Rhythm: normal sinus rhythm QRS Axis: normal Intervals: Left anterior fascicular block ST/T Wave abnormalities: normal Narrative Interpretation: no evidence of acute ischemia    RADIOLOGY I viewed and interpreted the patient's CT of the abdomen and pelvis.  See hospital course for details, but there are no acute abnormalities.   PROCEDURES:  Critical Care performed: No  .1-3 Lead EKG Interpretation  Performed by: Loleta Rose, MD Authorized by: Loleta Rose, MD     Interpretation: abnormal     ECG rate:  105   ECG rate assessment: tachycardic     Rhythm: sinus tachycardia     Ectopy: none     Conduction: normal       IMPRESSION / MDM / ASSESSMENT AND PLAN / ED COURSE  I reviewed the triage vital signs and the nursing notes.                              Differential diagnosis includes, but is not limited to, SBO/ileus, mesenteric ischemia, acute on chronic pain, interstitial  cystitis, UTI/pyelonephritis.  Patient's presentation is most consistent with acute presentation with potential threat to life or bodily function.  Labs/studies ordered: CBC, lactic acid, CMP, lipase, urinalysis, CT of the abdomen and pelvis.  Interventions/Medications given:  Medications  morphine (PF) 4 MG/ML injection 4 mg (4 mg Intravenous Given 05/02/23 0451)  ondansetron (ZOFRAN) injection 4 mg (4 mg Intravenous Given 05/02/23 0451)  sodium chloride 0.9 % bolus 500 mL (0 mLs Intravenous Stopped 05/02/23 0521)    (Note:  hospital course my include additional interventions and/or labs/studies not listed above.)   Hypertension likely due to pain as well as essential hypertension at baseline.  Mild tachycardia, also likely due to pain but could also represent acute infectious process/sepsis.  Workup is pending.  Morphine and Zofran as listed above for symptoms and 500 mL normal saline for fluid resuscitation.  The patient is on the cardiac monitor to evaluate for evidence of arrhythmia and/or significant heart rate changes.   Clinical Course as of 05/02/23 0648  Mon May 02, 2023  1610 Normal lactic acid, lipase, and CBC other than some mild hemoconcentration.  CMP is essentially normal other than a slightly decreased potassium level which is likely not clinically significant.  I started order a CT of the abdomen and pelvis with IV contrast but I see that she has a questionable allergy of angioedema listed to IV contrast media.  Will avoid any potential issues by ordering a CT of the abdomen pelvis without contrast; this should be adequate and sufficient to rule out the majority of the emergent conditions that are likely to be causing the patient's discomfort. [CF]  0646 CT ABDOMEN PELVIS WO CONTRAST I viewed and interpreted the patient's CT scan and I see no acute abnormalities.  The radiologist commented that there are no acute abnormalities send at the prior ovarian cyst/structure is  unchanged.  I updated the patient and she feels better although the pain is not completely gone.  I provided reassurance that everything on her evaluation was reassuring and essentially normal with no acute findings and she agreed it may be her chronic pain.  Given the "green" bowel movements, perhaps she has a small GI virus.  I encouraged her to continue taking her chronic pain medication and follow-up with her regular doctor and I gave my usual return precautions.  She and her daughter understand and agree with the  plan. [CF]    Clinical Course User Index [CF] Loleta Rose, MD     FINAL CLINICAL IMPRESSION(S) / ED DIAGNOSES   Final diagnoses:  Lower abdominal pain     Rx / DC Orders   ED Discharge Orders     None        Note:  This document was prepared using Dragon voice recognition software and may include unintentional dictation errors.   Loleta Rose, MD 05/02/23 [phone]

## 2023-05-03 ENCOUNTER — Telehealth: Payer: Self-pay | Admitting: Nurse Practitioner

## 2023-05-03 NOTE — Telephone Encounter (Signed)
Pt was seen at [facility] ED for abd pain on 05/02/23, she has a hosp f/up for 05/04/23.

## 2023-05-04 ENCOUNTER — Ambulatory Visit (INDEPENDENT_AMBULATORY_CARE_PROVIDER_SITE_OTHER): Payer: Medicare Other | Admitting: Nurse Practitioner

## 2023-05-04 ENCOUNTER — Encounter: Payer: Self-pay | Admitting: Nurse Practitioner

## 2023-05-04 ENCOUNTER — Other Ambulatory Visit (HOSPITAL_COMMUNITY): Payer: Self-pay

## 2023-05-04 VITALS — BP 138/86 | HR 80 | Temp 98.1°F | Wt 197.0 lb

## 2023-05-04 DIAGNOSIS — R110 Nausea: Secondary | ICD-10-CM | POA: Diagnosis not present

## 2023-05-04 DIAGNOSIS — E876 Hypokalemia: Secondary | ICD-10-CM | POA: Diagnosis not present

## 2023-05-04 DIAGNOSIS — T502X5A Adverse effect of carbonic-anhydrase inhibitors, benzothiadiazides and other diuretics, initial encounter: Secondary | ICD-10-CM

## 2023-05-04 DIAGNOSIS — R1084 Generalized abdominal pain: Secondary | ICD-10-CM | POA: Diagnosis not present

## 2023-05-04 MED ORDER — POTASSIUM CHLORIDE CRYS ER 20 MEQ PO TBCR
20.0000 meq | EXTENDED_RELEASE_TABLET | Freq: Every day | ORAL | 0 refills | Status: DC
Start: 2023-05-04 — End: 2023-08-05
  Filled 2023-05-04: qty 30, 30d supply, fill #0

## 2023-05-04 NOTE — Transitions of Care (Post Inpatient/ED Visit) (Signed)
05/04/2023  Name: Heather Shaw MRN: [id number] DOB: September 20, 1940  Today's TOC FU Call Status: Today's TOC FU Call Status:: Successful TOC FU Call Completed TOC FU Call Complete Date: 05/04/23 Patient's Name and Date of Birth confirmed.  Transition Care Management Follow-up Telephone Call Date of Discharge: 05/02/23 Discharge Facility: [facility]) Type of Discharge: Emergency Department How have you been since you were released from the hospital?: Same Any questions or concerns?: No  Items Reviewed: Did you receive and understand the discharge instructions provided?: Yes Medications obtained,verified, and reconciled?: Yes (Medications Reviewed) Any new allergies since your discharge?: No Dietary orders reviewed?: NA Do you have support at home?: No  Medications Reviewed Today: Medications Reviewed Today     Reviewed by Larey Dresser, RN (Registered Nurse) on 05/04/23 at [phone]  Med List Status: <None>   Medication Order Taking? Sig Documenting Provider Last Dose Status Informant  amLODipine (NORVASC) 10 MG tablet [id number] Yes Take 1 tablet (10 mg total) by mouth daily. [name], [name], NP Taking Active   Calcium Glycerophosphate (PRELIEF PO) [id number] No Take 2 capsules by mouth daily as needed (uses when eating acitic foods).  Patient not taking: Reported on 05/04/2023   [provider] Not Taking Active Self  carvedilol (COREG) 6.25 MG tablet [id number] Yes Take 1 tablet (6.25 mg total) by mouth 2 (two) times daily. Chrystie Nose, MD Taking Active   Cholecalciferol (VITAMIN D3) 50 MCG (2000 UT) capsule [id number] Yes Take 1 capsule (2,000 Units total) by mouth daily. [name], [name], NP Taking Active   ezetimibe (ZETIA) 10 MG tablet [id number] Yes Take 1 tablet (10 mg total) by mouth daily. Chrystie Nose, MD Taking Active   fluocinonide-emollient (LIDEX-E) 0.05 % cream [id number] Yes Apply 1 Application topically 2 (two)  times daily as needed. [name], [name], NP Taking Active   gabapentin (NEURONTIN) 100 MG capsule [id number] Yes Take 2 capsules (200 mg total) by mouth at bedtime. [name], [name], NP Taking Active   hydrALAZINE (APRESOLINE) 25 MG tablet [id number] Yes Take 1 tablet (25 mg total) by mouth 2 (two) times daily. [name], [name], NP Taking Active   hydrochlorothiazide (HYDRODIURIL) 25 MG tablet [id number] Yes Take 1 tablet (25 mg total) by mouth daily. Anne Ng, NP Taking Active   HYDROcodone-acetaminophen (NORCO) 7.5-325 MG tablet [id number] Yes Take 1 tablet by mouth every 8 (eight) hours. [name], [name], NP Taking Active   meclizine (ANTIVERT) 25 MG tablet [id number] Yes Take 25 mg by mouth 3 (three) times daily as needed for dizziness. [provider] Taking Active   tiZANidine (ZANAFLEX) 4 MG tablet [id number] No Take 1 tablet (4 mg total) by mouth every 12 (twelve) hours as needed for muscle spasms.  Patient not taking: Reported on 05/04/2023   [name], [name], NP Not Taking Active             Home Care and Equipment/Supplies: Were Home Health Services Ordered?: NA Any new equipment or medical supplies ordered?: NA  Functional Questionnaire: Do you need assistance with bathing/showering or dressing?: No Do you need assistance with meal preparation?: No Do you need assistance with eating?: No Do you have difficulty maintaining continence: No Do you need assistance with getting out of bed/getting out of a chair/moving?: No Do you have difficulty managing or taking your medications?: No  Follow up appointments reviewed: PCP Follow-up appointment confirmed?: Yes Date of PCP follow-up appointment?: 05/04/23 Follow-up Provider: [facility]  Follow-up appointment confirmed?: No Do you need transportation to your follow-up appointment?: No Do you understand care options if your condition(s) worsen?: Yes-patient verbalized  understanding    SIGNATURE Arvil Persons, BSN, RN

## 2023-05-04 NOTE — Progress Notes (Signed)
Established Patient Visit  Patient: Heather Shaw   DOB: 02-10-1941   82 y.o. Female  MRN: [id number] Visit Date: 05/04/2023  Subjective:    Chief Complaint  Patient presents with   Follow-up    ED follow up lower abdominal pain. Abdominal pain still the same    Abdominal Pain This is a new problem. The current episode started in the past 7 days. The onset quality is gradual. The problem occurs constantly. The problem has been waxing and waning. The pain is located in the generalized abdominal region. The pain is severe. The quality of the pain is colicky and a sensation of fullness. The abdominal pain radiates to the suprapubic region. Associated symptoms include anorexia, frequency and nausea. Pertinent negatives include no arthralgias, belching, constipation, diarrhea, dysuria, fever, flatus, headaches, hematochezia, hematuria, melena, myalgias, vomiting or weight loss. Nothing aggravates the pain. The pain is relieved by Nothing. She has tried nothing for the symptoms. Prior diagnostic workup includes CT scan (blood draw, UA and CT completed 05/03/23).  Last colonoscopy and upper endoscopy 2017. Last BM 05/03/23: normal per patient  Wt Readings from Last 3 Encounters:  05/04/23 197 lb (89.4 kg)  05/02/23 198 lb (89.8 kg)  04/15/23 198 lb 12.8 oz (90.2 kg)    Reviewed medical, surgical, and social history today  Medications: Outpatient Medications Prior to Visit  Medication Sig   amLODipine (NORVASC) 10 MG tablet Take 1 tablet (10 mg total) by mouth daily.   carvedilol (COREG) 6.25 MG tablet Take 1 tablet (6.25 mg total) by mouth 2 (two) times daily.   Cholecalciferol (VITAMIN D3) 50 MCG (2000 UT) capsule Take 1 capsule (2,000 Units total) by mouth daily.   ezetimibe (ZETIA) 10 MG tablet Take 1 tablet (10 mg total) by mouth daily.   fluocinonide-emollient (LIDEX-E) 0.05 % cream Apply 1 Application topically 2 (two) times daily as needed.   gabapentin (NEURONTIN)  100 MG capsule Take 2 capsules (200 mg total) by mouth at bedtime.   hydrALAZINE (APRESOLINE) 25 MG tablet Take 1 tablet (25 mg total) by mouth 2 (two) times daily.   hydrochlorothiazide (HYDRODIURIL) 25 MG tablet Take 1 tablet (25 mg total) by mouth daily.   [START ON 06/06/2023] HYDROcodone-acetaminophen (NORCO) 7.5-325 MG tablet Take 1 tablet by mouth every 8 (eight) hours.   meclizine (ANTIVERT) 25 MG tablet Take 25 mg by mouth 3 (three) times daily as needed for dizziness.   Calcium Glycerophosphate (PRELIEF PO) Take 2 capsules by mouth daily as needed (uses when eating acitic foods). (Patient not taking: Reported on 05/04/2023)   tiZANidine (ZANAFLEX) 4 MG tablet Take 1 tablet (4 mg total) by mouth every 12 (twelve) hours as needed for muscle spasms. (Patient not taking: Reported on 05/04/2023)   No facility-administered medications prior to visit.   Reviewed past medical and social history.   ROS per HPI above  Last CBC Lab Results  Component Value Date   WBC 9.2 05/02/2023   HGB 15.5 (H) 05/02/2023   HCT 45.0 05/02/2023   MCV 83.0 05/02/2023   MCH 28.6 05/02/2023   RDW 12.6 05/02/2023   PLT 251 05/02/2023   Last metabolic panel Lab Results  Component Value Date   GLUCOSE 126 (H) 05/02/2023   NA 137 05/02/2023   K 3.3 (L) 05/02/2023   CL 103 05/02/2023   CO2 22 05/02/2023   BUN 13 05/02/2023   CREATININE 0.64 05/02/2023  GFRNONAA >60 05/02/2023   CALCIUM 9.6 05/02/2023   PHOS 3.7 04/15/2023   PROT 7.8 05/02/2023   ALBUMIN 4.5 05/02/2023   BILITOT 0.8 05/02/2023   ALKPHOS 58 05/02/2023   AST 18 05/02/2023   ALT 12 05/02/2023   ANIONGAP 12 05/02/2023   CT ABDOMEN/pelvis 05/03/2023: No acute or interval finding.Chronic findings are stable from prior and described above.Chronic left adnexal mass measuring 9 cm anterior to posterior, unchanged when remeasured in a similar fashion. A high-density nodule in the right ovary measures 9 mm, also stable.  Normal urinalysis  05/03/23     Objective:  BP 138/86 (BP Location: Left Arm, Patient Position: Sitting, Cuff Size: Large)   Pulse 80   Temp 98.1 F (36.7 C) (Temporal)   Wt 197 lb (89.4 kg)   SpO2 99%   BMI 32.78 kg/m      Physical Exam Vitals and nursing note reviewed.  Constitutional:      General: She is not in acute distress. Cardiovascular:     Rate and Rhythm: Normal rate.     Pulses: Normal pulses.  Pulmonary:     Effort: Pulmonary effort is normal.  Abdominal:     General: There is no distension.     Palpations: There is no mass.     Tenderness: There is abdominal tenderness. There is guarding. There is no right CVA tenderness or left CVA tenderness.  Neurological:     Mental Status: She is alert and oriented to person, place, and time.     No results found for any visits on 05/04/23.    Assessment & Plan:    Problem List Items Addressed This Visit   None Visit Diagnoses     Generalized abdominal pain    -  Primary   Relevant Orders   H. pylori breath test   Nausea       Relevant Orders   H. pylori breath test   Diuretic-induced hypokalemia       Relevant Medications   potassium chloride SA (KLOR-CON M) 20 MEQ tablet      Return if symptoms worsen or fail to improve.     Alysia Penna, NP

## 2023-05-04 NOTE — Patient Instructions (Addendum)
Try OVER THE COUNTER IBGuard as directed on package Maintain low sodium BRAT diet Schedule appointment with GI Go to lab  Bland Diet A bland diet may consist of soft foods or foods that are not high in fat or are not greasy, acidic, or spicy. Avoiding certain foods may cause less irritation to your mouth, throat, stomach, or gastrointestinal tract. Avoiding certain foods may make you feel better. Everyone's tolerances are different. A bland diet should be based on what you can tolerate and what may cause discomfort. What is my plan? Your health care provider or dietitian may recommend specific changes to your diet to treat your symptoms. These changes may include: Eating small meals frequently. Cooking food until it is soft enough to chew easily. Taking the time to chew your food thoroughly, so it is easy to swallow and digest. Avoiding foods that cause you discomfort. These may include spicy food, fried food, greasy foods, hard-to-chew foods, or citrus fruits and juices. Drinking slowly. What are tips for following this plan? Reading food labels To reduce fiber intake, look for food labels that say "whole," such as whole wheat or whole grain. Shopping Avoid food items that may have nuts or seeds. Avoid vegetables that may make you gassy or have a tough texture, such as broccoli, cauliflower, or corn. Cooking Cook foods thoroughly so they have a soft texture. Meal planning Make sure you include foods from all food groups to eat a balanced diet. Eat a variety of types of foods. Eat foods and drink beverages that do not cause you discomfort. These may include soups and broths with cooked meats, pasta, and vegetables. Lifestyle Sit up after meals, avoid tight clothing, and take time to eat and chew your food slowly. Ask your health care provider whether you should take dietary supplements. General information Mildly season your foods. Some seasonings, such as cayenne pepper, vinegar, or  hot sauce, may cause irritation. The foods, beverages, or seasonings to avoid should be based on individual tolerance. What foods should I eat? Fruits Canned or cooked fruit such as peaches, pears, or applesauce. Bananas. Vegetables Well-cooked vegetables. Canned or cooked vegetables such as carrots, green beans, beets, or spinach. Mashed or boiled potatoes. Grains  Hot cereals, such as cream of wheat and processed oatmeal. Rice. Bread, crackers, pasta, or tortillas made from refined white flour. Meats and other proteins  Eggs. Creamy peanut butter or other nut butters. Lean, well-cooked tender meats, such as beef, pork, chicken, or fish. Dairy Low-fat dairy products such as milk, cottage cheese, or yogurt. Beverages  Water. Herbal tea. Apple juice. Fats and oils Mild salad dressings. Canola or olive oil. Sweets and desserts Low-fat pudding, custard, or ice cream. Fruit gelatin. The items listed above may not be a complete list of foods and beverages you can eat. Contact a dietitian for more information. What foods should I avoid? Fruits Citrus fruits, such as oranges and grapefruit. Fruits with a stringy texture. Fruits that have lots of seeds, such as kiwi or strawberries. Dried fruits. Vegetables Raw, uncooked vegetables. Salads. Grains Whole grain breads, muffins, and cereals. Meats and other proteins Tough, fibrous meats. Highly seasoned meat such as corned beef, smoked meats, or fish. Processed high-fat meats such as brats, hot dogs, or sausage. Dairy Full-fat dairy foods such as ice cream and cheese. Beverages Caffeinated drinks. Alcohol. Seasonings and condiments Strongly flavored seasonings or condiments. Hot sauce. Salsa. Other foods Spicy foods. Fried or greasy foods. Sour foods, such as pickled or fermented foods  like sauerkraut. Foods high in fiber. The items listed above may not be a complete list of foods and beverages you should avoid. Contact a dietitian for  more information. Summary A bland diet should be based on individual tolerance. It may consist of foods that are soft textured and do not have a lot of fat, fiber, acid, or seasonings. A bland diet may be recommended because avoiding certain foods, beverages, or spices may make you feel better. This information is not intended to replace advice given to you by your health care provider. Make sure you discuss any questions you have with your health care provider. Document Revised: 06/01/2021 Document Reviewed: 06/01/2021 Elsevier Patient Education  2024 [employer].

## 2023-05-05 ENCOUNTER — Other Ambulatory Visit (HOSPITAL_COMMUNITY): Payer: Self-pay

## 2023-05-05 ENCOUNTER — Telehealth: Payer: Self-pay | Admitting: Nurse Practitioner

## 2023-05-05 DIAGNOSIS — F1120 Opioid dependence, uncomplicated: Secondary | ICD-10-CM

## 2023-05-05 DIAGNOSIS — M4807 Spinal stenosis, lumbosacral region: Secondary | ICD-10-CM

## 2023-05-05 DIAGNOSIS — M51379 Other intervertebral disc degeneration, lumbosacral region without mention of lumbar back pain or lower extremity pain: Secondary | ICD-10-CM

## 2023-05-05 DIAGNOSIS — G8929 Other chronic pain: Secondary | ICD-10-CM

## 2023-05-05 LAB — H. PYLORI BREATH TEST: H. pylori Breath Test: NOT DETECTED

## 2023-05-05 NOTE — Telephone Encounter (Signed)
Pt stated she went to pharmacy to pick up her med  Rx #: [id number]  HYDROcodone-acetaminophen (NORCO) 7.5-325 MG tablet [[id number]] and was told they could not fill due to it was written as start date of 06/06/23.  She has asked me to make this urgent because she will be out tomorrow.

## 2023-05-06 ENCOUNTER — Other Ambulatory Visit (HOSPITAL_COMMUNITY): Payer: Self-pay

## 2023-05-06 MED ORDER — HYDROCODONE-ACETAMINOPHEN 7.5-325 MG PO TABS
1.0000 | ORAL_TABLET | Freq: Three times a day (TID) | ORAL | 0 refills | Status: DC
Start: 2023-05-06 — End: 2023-06-03
  Filled 2023-05-06: qty 90, 30d supply, fill #0

## 2023-05-06 NOTE — Telephone Encounter (Signed)
I called the pharmacy to inquire about patient Rx and was told Rx was discontinued and a new Rx is needed,  patient would like to pick up today  HYDROcodone-acetaminophen (NORCO) 7.5-325 MG tablet

## 2023-05-06 NOTE — Telephone Encounter (Signed)
Pt called this morning to ask if the med would be corrected by lunch. She stated she is headed to [facility] and would like to pick it up.

## 2023-05-06 NOTE — Addendum Note (Signed)
Addended by: Alysia Penna L on: 05/06/2023 12:04 PM   Modules accepted: Orders

## 2023-05-13 ENCOUNTER — Other Ambulatory Visit (HOSPITAL_COMMUNITY): Payer: Self-pay

## 2023-05-19 ENCOUNTER — Other Ambulatory Visit: Payer: Self-pay | Admitting: *Deleted

## 2023-05-19 ENCOUNTER — Other Ambulatory Visit: Payer: Self-pay

## 2023-05-19 NOTE — Patient Instructions (Signed)
Visit Information  Thank you for taking time to visit with me today. Please don't hesitate to contact me if I can be of assistance to you before our next scheduled telephone appointment.  Following are the goals we discussed today:   Goals Addressed             This Visit's Progress    RNCM Care Management  Expected Outcome:  Monitor, Self-Manage and Reduce Symptoms of: Chronic Pain       Current Barriers:  Chronic Disease Management support and education needs related to for effective management of Chronic Pain with long term Opioid use  Planned Interventions: Reviewed provider established plan for pain management. The patient has chronic pain, specifically with back pain and arthritis. The patient has long term use of Opioids and sees provider on a regular basis. States that her pain is consistently a 6/10.  Discussed importance of adherence to all scheduled medical appointments.  Counseled on the importance of reporting any/all new or changed pain symptoms or management strategies to pain management provider; Advised patient to report to care team affect of pain on daily activities.  Discussed use of relaxation techniques and/or diversional activities to assist with pain reduction (distraction, imagery, relaxation, massage, acupressure, TENS, heat, and cold application. The patient uses heat application and cold application also. The patient states she has a system that works for her well. Education and support given; Reviewed with patient prescribed pharmacological and nonpharmacological pain relief strategies. Review and education provided. Discussed mindfulness and self care. The patient is having a lot of stress in  her life right now related to her son and his drinking.  Advised patient to discuss changes in her level of pain or intensity of pain, or unresolved pain  with provider; Screening for signs and symptoms of depression related to chronic disease state;  Assessed social  determinant of health barriers;  Review of Safety and falls prevention with the patient and making sure to be safe and mindful of her surroundings.  Symptom Management: Take medications as prescribed   Attend all scheduled provider appointments Call provider office for new concerns or questions  call the Suicide and Crisis Lifeline: 988 call the [place] National Suicide Prevention Lifeline: 909-462-8326 or TTY: (775)583-3674 TTY 678-567-4144) to talk to a trained counselor call 1-800-273-TALK (toll free, 24 hour hotline) if experiencing a Mental Health or Behavioral Health Crisis   Follow Up Plan: Telephone follow up appointment with care management team member scheduled for: 07-07-2023 at 230 pm       RNCM Care Management  Expected Outcome:  Monitor, Self-Manage, and Reduce Symptoms of Hypertension       Current Barriers:  Chronic Disease Management support and education needs related to effective management of HTN BP Readings from Last 3 Encounters:  05/04/23 138/86  05/02/23 (!) 161/84  04/15/23 138/86    Planned Interventions: Evaluation of current treatment plan related to hypertension self management and patient's adherence to plan as established by provider. The patients blood pressures have been elevated but she is compliant with her medications. Education on the benefits of continuation of monitoring of blood pressures and calling the office for abnormal readings. ;   Provided education to patient re: stroke prevention, s/s of heart attack and stroke. Reviewed the risk of heart attack and stroke with uncontrolled blood pressures; Reviewed prescribed diet heart healthy diet.  Reviewed medications with patient and discussed importance of compliance. The patient is compliant with medications. States no new concerns at this  time with medications. PCP added hydralazine to her medication regime and she states she is adhering to the care plan. Discussed plans with patient for ongoing  care management follow up and provided patient with direct contact information for care management team; Advised patient, providing education and rationale, to monitor blood pressure daily and record, calling PCP for findings outside established parameters. She states she sometimes forgets to take her BP but she does have a working machine Reviewed scheduled/upcoming provider appointments including:is calling after outreach today to secure an appointment with the pcp. The patient has a follow up with the cardiologist on 06-01-2023 Advised patient to discuss changes in her blood pressures or heart health with provider; Provided education on prescribed diet heart healthy diet ;  Discussed complications of poorly controlled blood pressure such as heart disease, stroke, circulatory complications, vision complications, kidney impairment, sexual dysfunction;  Screening for signs and symptoms of depression related to chronic disease state;  Assessed social determinant of health barriers;  Resources provided for help with stress relief. The patient also has the [facility] number to call for needs or support in between visits. The patient states her stress level is better. She is focusing on self care and making sure she gets the things she needs.  Symptom Management: Take medications as prescribed   Attend all scheduled provider appointments Call provider office for new concerns or questions  call the Suicide and Crisis Lifeline: 988 call the [place] National Suicide Prevention Lifeline: 272-480-1650 or TTY: 858-360-0885 TTY 747-199-2387) to talk to a trained counselor call 1-800-273-TALK (toll free, 24 hour hotline) if experiencing a Mental Health or Behavioral Health Crisis  check blood pressure 3 times per week learn about high blood pressure keep a blood pressure log take blood pressure log to all doctor appointments call doctor for signs and symptoms of high blood pressure develop an action plan for high  blood pressure keep all doctor appointments take medications for blood pressure exactly as prescribed report new symptoms to your doctor  Follow Up Plan: Telephone follow up appointment with care management team member scheduled for: 07-07-2023 at 230 pm       RNCM Care Management Expected Outcome:  Monitor, Self-Manage and Reduce Symptoms of:HLD       Current Barriers:  Knowledge Deficits related to medications to help with HLD and dietary help with maintaining healthy cholesterol levels Chronic Disease Management support and education needs related to effective management of HLD Lab Results  Component Value Date   CHOL 209 (H) 12/09/2022   HDL 42.70 12/09/2022   LDLCALC 130 (H) 12/09/2022   LDLDIRECT 128.0 07/30/2022   TRIG 179.0 (H) 12/09/2022   CHOLHDL 5 12/09/2022     Planned Interventions: Provider established cholesterol goals reviewed. Review of goals of cholesterol. The patient states she knows her levels are out of range. Patient is compliant with her Zetia but states that it makes he feel bad and she has muscle aches and pains. She stated she is going to follow up with cardiology on 06/01/23. Counseled on importance of regular laboratory monitoring as prescribed. Review of having regular lab work to check levels. Review of levels and she has had some drops in the levels and is thankful for this. The patient is keeping her appointment with the lipid clinic ; Provided HLD educational materials.  Reviewed role and benefits of statin for ASCVD risk reduction.  Discussed strategies to manage statin-induced myalgias. The patient has tried different statins but still has issues with myalgias and  pain. She is wanting to see what other options there are. Education and support given.; Reviewed importance of limiting foods high in cholesterol.  Screening for signs and symptoms of depression related to chronic disease state;  Assessed social determinant of health barriers;   Symptom  Management: Take medications as prescribed   Attend all scheduled provider appointments Call provider office for new concerns or questions  call the Suicide and Crisis Lifeline: 988 call the [place] National Suicide Prevention Lifeline: 276-627-5848 or TTY: 910-634-3733 TTY 4255356280) to talk to a trained counselor call 1-800-273-TALK (toll free, 24 hour hotline) if experiencing a Mental Health or Behavioral Health Crisis  - call for medicine refill 2 or 3 days before it runs out - take all medications exactly as prescribed - call doctor with any symptoms you believe are related to your medicine - call doctor when you experience any new symptoms - go to all doctor appointments as scheduled - adhere to prescribed diet: Heart healthy diet   Follow Up Plan: Telephone follow up appointment with care management team member scheduled for: 07-07-2023 at 230 pm           Our next appointment is by telephone on 07-07-2023 at 2:30 pm  Please call the care guide team at [phone] if you need to cancel or reschedule your appointment.   If you are experiencing a Mental Health or Behavioral Health Crisis or need someone to talk to, please call the Suicide and Crisis Lifeline: 988 call the [place] National Suicide Prevention Lifeline: (386)832-1793 or TTY: (912) 111-8885 TTY 813-538-9947) to talk to a trained counselor call 1-800-273-TALK (toll free, 24 hour hotline) go to [facility] [address] [place] [phone]) call 911   Patient verbalizes understanding of instructions and care plan provided today and agrees to view in MyChart. Active MyChart status and patient understanding of how to access instructions and care plan via MyChart confirmed with patient.     Telephone follow up appointment with care management team member scheduled for: 07-07-2023 at 2:30 pm  Danise Edge, BSN RN RN Care Manager  [facility]  Ambulatory Care Management   Direct Number: [phone]

## 2023-05-19 NOTE — Patient Outreach (Signed)
Care Management   Visit Note  05/19/2023 Name: Heather Shaw MRN: [id number] DOB: Apr 04, 1941  Subjective: Heather Shaw is a 82 y.o. year old female who is a primary care patient of [name], [name], NP. The Care Management team was consulted for assistance.      Engaged with patient spoke with patient by telephone.    Goals Addressed             This Visit's Progress    RNCM Care Management  Expected Outcome:  Monitor, Self-Manage and Reduce Symptoms of: Chronic Pain       Current Barriers:  Chronic Disease Management support and education needs related to for effective management of Chronic Pain with long term Opioid use  Planned Interventions: Reviewed provider established plan for pain management. The patient has chronic pain, specifically with back pain and arthritis. The patient has long term use of Opioids and sees provider on a regular basis. States that her pain is consistently a 6/10.  Discussed importance of adherence to all scheduled medical appointments.  Counseled on the importance of reporting any/all new or changed pain symptoms or management strategies to pain management provider; Advised patient to report to care team affect of pain on daily activities.  Discussed use of relaxation techniques and/or diversional activities to assist with pain reduction (distraction, imagery, relaxation, massage, acupressure, TENS, heat, and cold application. The patient uses heat application and cold application also. The patient states she has a system that works for her well. Education and support given; Reviewed with patient prescribed pharmacological and nonpharmacological pain relief strategies. Review and education provided. Discussed mindfulness and self care. The patient is having a lot of stress in  her life right now related to her son and his drinking.  Advised patient to discuss changes in her level of pain or intensity of pain, or unresolved pain  with  provider; Screening for signs and symptoms of depression related to chronic disease state;  Assessed social determinant of health barriers;  Review of Safety and falls prevention with the patient and making sure to be safe and mindful of her surroundings.  Symptom Management: Take medications as prescribed   Attend all scheduled provider appointments Call provider office for new concerns or questions  call the Suicide and Crisis Lifeline: 988 call the [place] National Suicide Prevention Lifeline: 440-743-8875 or TTY: (928)005-9330 TTY (320)124-7457) to talk to a trained counselor call 1-800-273-TALK (toll free, 24 hour hotline) if experiencing a Mental Health or Behavioral Health Crisis   Follow Up Plan: Telephone follow up appointment with care management team member scheduled for: 07-07-2023 at 230 pm       RNCM Care Management  Expected Outcome:  Monitor, Self-Manage, and Reduce Symptoms of Hypertension       Current Barriers:  Chronic Disease Management support and education needs related to effective management of HTN BP Readings from Last 3 Encounters:  05/04/23 138/86  05/02/23 (!) 161/84  04/15/23 138/86    Planned Interventions: Evaluation of current treatment plan related to hypertension self management and patient's adherence to plan as established by provider. The patients blood pressures have been elevated but she is compliant with her medications. Education on the benefits of continuation of monitoring of blood pressures and calling the office for abnormal readings. ;   Provided education to patient re: stroke prevention, s/s of heart attack and stroke. Reviewed the risk of heart attack and stroke with uncontrolled blood pressures; Reviewed prescribed diet heart healthy diet.  Reviewed  medications with patient and discussed importance of compliance. The patient is compliant with medications. States no new concerns at this time with medications. PCP added hydralazine to her  medication regime and she states she is adhering to the care plan. Discussed plans with patient for ongoing care management follow up and provided patient with direct contact information for care management team; Advised patient, providing education and rationale, to monitor blood pressure daily and record, calling PCP for findings outside established parameters. She states she sometimes forgets to take her BP but she does have a working machine Reviewed scheduled/upcoming provider appointments including:is calling after outreach today to secure an appointment with the pcp. The patient has a follow up with the cardiologist on 06-01-2023 Advised patient to discuss changes in her blood pressures or heart health with provider; Provided education on prescribed diet heart healthy diet ;  Discussed complications of poorly controlled blood pressure such as heart disease, stroke, circulatory complications, vision complications, kidney impairment, sexual dysfunction;  Screening for signs and symptoms of depression related to chronic disease state;  Assessed social determinant of health barriers;  Resources provided for help with stress relief. The patient also has the [facility] number to call for needs or support in between visits. The patient states her stress level is better. She is focusing on self care and making sure she gets the things she needs.  Symptom Management: Take medications as prescribed   Attend all scheduled provider appointments Call provider office for new concerns or questions  call the Suicide and Crisis Lifeline: 988 call the [place] National Suicide Prevention Lifeline: 902-227-8033 or TTY: 360-249-2973 TTY (814)027-4897) to talk to a trained counselor call 1-800-273-TALK (toll free, 24 hour hotline) if experiencing a Mental Health or Behavioral Health Crisis  check blood pressure 3 times per week learn about high blood pressure keep a blood pressure log take blood pressure log to all  doctor appointments call doctor for signs and symptoms of high blood pressure develop an action plan for high blood pressure keep all doctor appointments take medications for blood pressure exactly as prescribed report new symptoms to your doctor  Follow Up Plan: Telephone follow up appointment with care management team member scheduled for: 07-07-2023 at 230 pm       RNCM Care Management Expected Outcome:  Monitor, Self-Manage and Reduce Symptoms of:HLD       Current Barriers:  Knowledge Deficits related to medications to help with HLD and dietary help with maintaining healthy cholesterol levels Chronic Disease Management support and education needs related to effective management of HLD Lab Results  Component Value Date   CHOL 209 (H) 12/09/2022   HDL 42.70 12/09/2022   LDLCALC 130 (H) 12/09/2022   LDLDIRECT 128.0 07/30/2022   TRIG 179.0 (H) 12/09/2022   CHOLHDL 5 12/09/2022     Planned Interventions: Provider established cholesterol goals reviewed. Review of goals of cholesterol. The patient states she knows her levels are out of range. Patient is compliant with her Zetia but states that it makes he feel bad and she has muscle aches and pains. She stated she is going to follow up with cardiology on 06/01/23. Counseled on importance of regular laboratory monitoring as prescribed. Review of having regular lab work to check levels. Review of levels and she has had some drops in the levels and is thankful for this. The patient is keeping her appointment with the lipid clinic ; Provided HLD educational materials.  Reviewed role and benefits of statin for ASCVD risk reduction.  Discussed strategies to manage statin-induced myalgias. The patient has tried different statins but still has issues with myalgias and pain. She is wanting to see what other options there are. Education and support given.; Reviewed importance of limiting foods high in cholesterol.  Screening for signs and symptoms  of depression related to chronic disease state;  Assessed social determinant of health barriers;   Symptom Management: Take medications as prescribed   Attend all scheduled provider appointments Call provider office for new concerns or questions  call the Suicide and Crisis Lifeline: 988 call the [place] National Suicide Prevention Lifeline: 813-050-8654 or TTY: 6782091202 TTY 9288326094) to talk to a trained counselor call 1-800-273-TALK (toll free, 24 hour hotline) if experiencing a Mental Health or Behavioral Health Crisis  - call for medicine refill 2 or 3 days before it runs out - take all medications exactly as prescribed - call doctor with any symptoms you believe are related to your medicine - call doctor when you experience any new symptoms - go to all doctor appointments as scheduled - adhere to prescribed diet: Heart healthy diet   Follow Up Plan: Telephone follow up appointment with care management team member scheduled for: 07-07-2023 at 230 pm           Consent to Services:  Patient was given information about care management services, agreed to services, and gave verbal consent to participate.   Plan: Telephone follow up appointment with care management team member scheduled for: 07-07-2023 at 2:30 pm  Danise Edge, BSN RN RN Care Manager  [facility]  Ambulatory Care Management  Direct Number: [phone]

## 2023-05-25 ENCOUNTER — Ambulatory Visit: Payer: Medicare Other | Attending: Internal Medicine

## 2023-05-27 DIAGNOSIS — Z8249 Family history of ischemic heart disease and other diseases of the circulatory system: Secondary | ICD-10-CM | POA: Diagnosis not present

## 2023-05-27 DIAGNOSIS — E782 Mixed hyperlipidemia: Secondary | ICD-10-CM | POA: Diagnosis not present

## 2023-05-28 LAB — LIPOPROTEIN A (LPA): Lipoprotein (a): 84.5 nmol/L — ABNORMAL HIGH (ref <75.0)

## 2023-05-29 LAB — NMR, LIPOPROFILE
Cholesterol, Total: 201 mg/dL — ABNORMAL HIGH (ref 100–199)
HDL Particle Number: 30 umol/L — ABNORMAL LOW (ref >=30.5)
HDL-C: 46 mg/dL (ref >39)
LDL Particle Number: 1622 nmol/L — ABNORMAL HIGH (ref <1000)
LDL Size: 20.7 nmol (ref >20.5)
LDL-C (NIH Calc): 131 mg/dL — ABNORMAL HIGH (ref 0–99)
LP-IR Score: 52 — ABNORMAL HIGH (ref <=45)
Small LDL Particle Number: 546 nmol/L — ABNORMAL HIGH (ref <=527)
Triglycerides: 134 mg/dL (ref 0–149)

## 2023-05-31 ENCOUNTER — Encounter (HOSPITAL_BASED_OUTPATIENT_CLINIC_OR_DEPARTMENT_OTHER): Payer: Medicare Other | Admitting: Internal Medicine

## 2023-06-01 ENCOUNTER — Encounter (HOSPITAL_BASED_OUTPATIENT_CLINIC_OR_DEPARTMENT_OTHER): Payer: Self-pay | Admitting: Nurse Practitioner

## 2023-06-01 ENCOUNTER — Ambulatory Visit (HOSPITAL_BASED_OUTPATIENT_CLINIC_OR_DEPARTMENT_OTHER): Payer: Medicare Other | Admitting: Nurse Practitioner

## 2023-06-01 ENCOUNTER — Telehealth: Payer: Self-pay | Admitting: Nurse Practitioner

## 2023-06-01 VITALS — BP 138/80 | HR 80 | Ht 65.0 in | Wt 200.2 lb

## 2023-06-01 DIAGNOSIS — Z8249 Family history of ischemic heart disease and other diseases of the circulatory system: Secondary | ICD-10-CM

## 2023-06-01 DIAGNOSIS — E782 Mixed hyperlipidemia: Secondary | ICD-10-CM

## 2023-06-01 DIAGNOSIS — I10 Essential (primary) hypertension: Secondary | ICD-10-CM

## 2023-06-01 DIAGNOSIS — T466X5D Adverse effect of antihyperlipidemic and antiarteriosclerotic drugs, subsequent encounter: Secondary | ICD-10-CM

## 2023-06-01 DIAGNOSIS — M791 Myalgia, unspecified site: Secondary | ICD-10-CM | POA: Diagnosis not present

## 2023-06-01 MED ORDER — REPATHA SURECLICK 140 MG/ML ~~LOC~~ SOAJ: 140.0000 mg | SUBCUTANEOUS | 6 refills | Status: DC

## 2023-06-01 NOTE — Patient Instructions (Signed)
Medication Instructions:   Inject ( 140 mg) into the skin every 14 days.   *If you need a refill on your cardiac medications before your next appointment, please call your pharmacy*   Lab Work:  None ordered.  If you have labs (blood work) drawn today and your tests are completely normal, you will receive your results only by: MyChart Message (if you have MyChart) OR A paper copy in the mail If you have any lab test that is abnormal or we need to change your treatment, we will call you to review the results.   Testing/Procedures:  None ordered.   Follow-Up: At [facility], you and your health needs are our priority.  As part of our continuing mission to provide you with exceptional heart care, we have created designated Provider Care Teams.  These Care Teams include your primary Cardiologist (physician) and Advanced Practice Providers (APPs -  Physician Assistants and Nurse Practitioners) who all work together to provide you with the care you need, when you need it.  We recommend signing up for the patient portal called "MyChart".  Sign up information is provided on this After Visit Summary.  MyChart is used to connect with patients for Virtual Visits (Telemedicine).  Patients are able to view lab/test results, encounter notes, upcoming appointments, etc.  Non-urgent messages can be sent to your provider as well.   To learn more about what you can do with MyChart, go to [email or web address].    Your next appointment:   3 month(s)  Provider:   Eligha Bridegroom, NP         Other Instructions  HOW TO TAKE YOUR BLOOD PRESSURE  Rest 5 minutes before taking your blood pressure. Don't  smoke or drink caffeinated beverages for at least 30 minutes before. Take your blood pressure before (not after) you eat. Sit comfortably with your back supported and both feet on the floor ( don't cross your legs). Elevate your arm to heart level on a table or a desk. Use the proper  sized cuff.  It should fit smoothly and snugly around your bare upper arm.  There should be  Enough room to slip a fingertip under the cuff.  The bottom edge of the cuff should be 1 inch above the crease Of the elbow. Please monitor your blood pressure once daily 2 hours after your am medication. If you blood pressure Consistently remains above 130 (systolic) top number or over 80 ( diastolic) bottom number X 3 days  Consecutively.  Please call our office at [phone] or send Mychart message.     ----Avoid cold medicines with D or DM at the end of them----  Please send BP readings and Hear Rates in 2 weeks by mychart.

## 2023-06-01 NOTE — Telephone Encounter (Signed)
Please complete PA for Repatha if needed, it was ordered today for elevated LPa and hyperlipidemia. She is intolerant of statins.

## 2023-06-01 NOTE — Progress Notes (Signed)
Cardiology Office Note:  .   Date:  06/01/2023  ID:  Heather Shaw, DOB 04-16-1941, MRN [id number] PCP: Anne Ng, NP  [facility] Providers Cardiologist:  Charlton Haws, MD    Patient Profile: .      PMH Dyslipidemia Hypertension Statin intolerance to multiple agents Family history of heart disease and blood clots Former tobacco abuse Elevated lipoprotein a  Interstitial cystitis  Referred to advanced lipid disorder clinic and seen by Dr. Rennis Golden 02/08/2023.  Most recent lipid panel revealed total cholesterol 209, triglycerides 179, HDL 42, and LDL 130.  She has not been able to tolerate statin therapy.  She has also had difficulty managing blood pressure.  BP was elevated and she had tachycardia felt to be related to recently starting hydralazine.  She was advised to start carvedilol 6.25 mg twice daily and to start ezetimibe 10 mg daily.  3 to 64-month follow-up was recommended.       History of Present Illness: Marland Kitchen   Heather Shaw is a pleasant 82 y.o. female who is here today for follow-up of hyperlipidemia and hypertension. Home BP readings fluctuate but she admits to an inconsistent medication schedule. She sometimes sleeps late due to difficulty falling asleep and takes medications at varying times. She is monitoring BP at various times of the day. We reviewed recent cholesterol results with LDL of 131 and an LDL particle number of 1622, both above the desired levels.Her triglycerides have improved from 179 to 134, indicating some positive effect of the current medication. She has family history of blood clots and heart disease. Her mother died at age 20 from blood clots in her legs, and her sister has recently been diagnosed with a blood clot in her leg. She has many dietary restrictions and challenges secondary to interstitial cystitis. She reports significant pain at times due to bladder pain as well as chronic back and knee pain.  She avoids certain foods and  drinks, including fruit and anything other than water, to prevent flare-ups of the condition. She follows a low-sodium diet. She denies chest pain, shortness of breath, lower extremity edema, fatigue, palpitations, presyncope, syncope, orthopnea, and PND.   Discussed the use of AI scribe software for clinical note transcription with the patient, who gave verbal consent to proceed.   ROS: See HPI       Studies Reviewed: .         Risk Assessment/Calculations:            Physical Exam:   VS:  BP 138/80   Pulse 80   Ht 5\' 5"  (1.651 m)   Wt 200 lb 3.2 oz (90.8 kg)   SpO2 97%   BMI 33.32 kg/m    Wt Readings from Last 3 Encounters:  06/01/23 200 lb 3.2 oz (90.8 kg)  05/04/23 197 lb (89.4 kg)  05/02/23 198 lb (89.8 kg)    GEN: Well nourished, well developed in no acute distress NECK: No JVD; No carotid bruits CARDIAC: RRR, no murmurs, rubs, gallops RESPIRATORY:  Clear to auscultation without rales, wheezing or rhonchi  ABDOMEN: Soft, non-tender, non-distended EXTREMITIES:  No edema; No deformity     ASSESSMENT AND PLAN: .    Dyslipidemia LDL goal < 100: Elevated LDL cholesterol 131(goal <100) and LDL particle number (1622, goal <1000). Elevated Lp(a). Family history of blood clots and heart disease. She has tried multiple statins but experienced intolerable side effects. Currently on ezetimibe with some improvement in triglycerides (134, down from  179). We will see if she is a candidate for PCSK9 inhibitor or bempedoic acid therapy in addition to ezetimibe since she is not at goal.  Encouraged regular physical activity including chair exercises, and movement around her home as tolerated as well as heart healthy diet avoiding saturated fat, processed foods, simple carbohydrates, and sugar. Continue ezetimibe.  See her back in 3 months and we will plan for follow-up lab work following 2-3 months of therapy with additional cholesterol lowering agent.   Hypertension: Home BP readings are  consistently elevated. She admits to inconsistent medication schedule. Currently on carvedilol, amlodipine, hydralazine, and hydrochlorothiazide. She does not want to add an additional anti-hypertensive agent unless absolutely necessary. Encouraged working with her daughter to help her get on a consistent medication schedule. Continue low sodium diet. Check blood pressure at home following provided instructions and report readings in two weeks. I will see her back in 3 months for follow-up.        Dispo: 3 months with me  Signed, Eligha Bridegroom, NP-C

## 2023-06-02 ENCOUNTER — Telehealth: Payer: Self-pay | Admitting: Pharmacy Technician

## 2023-06-02 ENCOUNTER — Other Ambulatory Visit (HOSPITAL_COMMUNITY): Payer: Self-pay

## 2023-06-02 ENCOUNTER — Encounter: Payer: Self-pay | Admitting: Nurse Practitioner

## 2023-06-02 DIAGNOSIS — F1120 Opioid dependence, uncomplicated: Secondary | ICD-10-CM

## 2023-06-02 DIAGNOSIS — M51379 Other intervertebral disc degeneration, lumbosacral region without mention of lumbar back pain or lower extremity pain: Secondary | ICD-10-CM

## 2023-06-02 DIAGNOSIS — G8929 Other chronic pain: Secondary | ICD-10-CM

## 2023-06-02 DIAGNOSIS — M4807 Spinal stenosis, lumbosacral region: Secondary | ICD-10-CM

## 2023-06-02 NOTE — Telephone Encounter (Signed)
Pharmacy Patient Advocate Encounter   Received notification from Pt Calls Messages that prior authorization for repatha is required/requested.   Insurance verification completed.   The patient is insured through [facility] .   Per test claim: PA required; PA submitted to above mentioned insurance via CoverMyMeds Key/confirmation #/EOC [id number] Status is pending

## 2023-06-03 ENCOUNTER — Telehealth: Payer: Self-pay

## 2023-06-03 ENCOUNTER — Other Ambulatory Visit (HOSPITAL_COMMUNITY): Payer: Self-pay

## 2023-06-03 MED ORDER — HYDROCODONE-ACETAMINOPHEN 7.5-325 MG PO TABS
1.0000 | ORAL_TABLET | Freq: Three times a day (TID) | ORAL | 0 refills | Status: DC
Start: 2023-06-06 — End: 2023-07-05
  Filled 2023-06-06: qty 90, 30d supply, fill #0

## 2023-06-03 NOTE — Telephone Encounter (Signed)
Refill request for  Hydrocodone/Acet 7.5/325 mg LR  05/06/23, #90, 0 rf LOV 05/04/23 FOV  none scheduled.  Please review and advise.  Thanks. Dm/cma

## 2023-06-03 NOTE — Telephone Encounter (Signed)
Transition Care Management Follow-up Telephone Call Date of discharge and from where: [facility] 10[facility]7 How have you been since you were released from the hospital? Doing ok and has followed up with PCP Any questions or concerns? No  Items Reviewed: Did the pt receive and understand the discharge instructions provided? Yes  Medications obtained and verified? Yes  Other? No  Any new allergies since your discharge? No  Dietary orders reviewed? No Do you have support at home? Yes    Follow up appointments reviewed:  PCP Hospital f/u appt confirmed? Yes  Scheduled to see  on  @ . Specialist Hospital f/u appt confirmed? No  Scheduled to see  on  @ . Are transportation arrangements needed? No  If their condition worsens, is the pt aware to call PCP or go to the Emergency Dept.? Yes Was the patient provided with contact information for the PCP's office or ED? Yes Was to pt encouraged to call back with questions or concerns? Yes

## 2023-06-06 ENCOUNTER — Other Ambulatory Visit (HOSPITAL_COMMUNITY): Payer: Self-pay

## 2023-06-06 NOTE — Telephone Encounter (Signed)
Pharmacy Patient Advocate Encounter  Received notification from [facility] that Prior Authorization for repatha has been APPROVED from 06/06/23 to 06/02/24. Ran test claim, Copay is $45.00- one month. This test claim was processed through [facility]- copay amounts may vary at other pharmacies due to pharmacy/plan contracts, or as the patient moves through the different stages of their insurance plan.   PA #/Case ID/Reference #: [id number]

## 2023-06-06 NOTE — Telephone Encounter (Signed)
Please notify pt that Repatha is $45.00 processed through [facility] pharmacy. We can submit to her preferred pharmacy if she is willing to proceed. Price may vary somewhat.

## 2023-06-07 NOTE — Telephone Encounter (Signed)
Lmtcb to see if Repatha is feasible for pt.

## 2023-06-14 ENCOUNTER — Other Ambulatory Visit: Payer: Self-pay | Admitting: *Deleted

## 2023-06-14 DIAGNOSIS — E782 Mixed hyperlipidemia: Secondary | ICD-10-CM

## 2023-06-14 DIAGNOSIS — Z79899 Other long term (current) drug therapy: Secondary | ICD-10-CM

## 2023-06-14 NOTE — Telephone Encounter (Signed)
Please order repeat NMR

## 2023-06-14 NOTE — Telephone Encounter (Signed)
S/w pt is going to start Repatha. Stated to set up pt for labs 1 week prior to ov on Feb 3. Please advise what labs and will place orders and print requisition for pt and mail. Confirmed pt's address.  Will route to [facility].

## 2023-06-14 NOTE — Telephone Encounter (Signed)
Placed in mail fasting lab paperwork for the last week in January.

## 2023-06-21 ENCOUNTER — Encounter: Payer: Self-pay | Admitting: Gastroenterology

## 2023-07-04 ENCOUNTER — Other Ambulatory Visit (HOSPITAL_COMMUNITY): Payer: Self-pay

## 2023-07-04 ENCOUNTER — Other Ambulatory Visit: Payer: Self-pay

## 2023-07-04 DIAGNOSIS — H40013 Open angle with borderline findings, low risk, bilateral: Secondary | ICD-10-CM | POA: Diagnosis not present

## 2023-07-05 ENCOUNTER — Telehealth: Payer: Self-pay | Admitting: Nurse Practitioner

## 2023-07-05 ENCOUNTER — Other Ambulatory Visit: Payer: Self-pay | Admitting: Nurse Practitioner

## 2023-07-05 DIAGNOSIS — M51379 Other intervertebral disc degeneration, lumbosacral region without mention of lumbar back pain or lower extremity pain: Secondary | ICD-10-CM

## 2023-07-05 DIAGNOSIS — F1120 Opioid dependence, uncomplicated: Secondary | ICD-10-CM

## 2023-07-05 DIAGNOSIS — E559 Vitamin D deficiency, unspecified: Secondary | ICD-10-CM

## 2023-07-05 DIAGNOSIS — M4807 Spinal stenosis, lumbosacral region: Secondary | ICD-10-CM

## 2023-07-05 DIAGNOSIS — G8929 Other chronic pain: Secondary | ICD-10-CM

## 2023-07-05 NOTE — Telephone Encounter (Signed)
Prescription Request  07/05/2023  LOV: 05/04/2023  What is the name of the medication or equipment? HYDROcodone-acetaminophen (NORCO) 7.5-325 MG tablet   Have you contacted your pharmacy to request a refill? No   Which pharmacy would you like this sent to?  [facility] [address] [place] [address] Phone: [phone] Fax: [phone]    Patient notified that their request is being sent to the clinical staff for review and that they should receive a response within 2 business days.   Please advise at Mobile [phone] (mobile)

## 2023-07-06 ENCOUNTER — Encounter: Payer: Self-pay | Admitting: Nurse Practitioner

## 2023-07-06 ENCOUNTER — Other Ambulatory Visit (HOSPITAL_COMMUNITY): Payer: Self-pay

## 2023-07-06 MED ORDER — HYDROCODONE-ACETAMINOPHEN 7.5-325 MG PO TABS
1.0000 | ORAL_TABLET | Freq: Three times a day (TID) | ORAL | 0 refills | Status: DC
Start: 2023-07-06 — End: 2023-08-05
  Filled 2023-07-06: qty 90, 30d supply, fill #0

## 2023-07-07 ENCOUNTER — Ambulatory Visit: Payer: Self-pay

## 2023-07-07 ENCOUNTER — Other Ambulatory Visit: Payer: Medicare Other

## 2023-07-07 NOTE — Patient Outreach (Signed)
  Care Coordination   Follow Up Visit Note   07/07/2023 Name: Heather Shaw MRN: [id number] DOB: January 13, 1941  Heather Shaw is a 82 y.o. year old female who sees [name], [name], NP for primary care. I spoke with  [name] by phone today.  What matters to the patients health and wellness today?  Cholesterol and Blood pressure control    Goals Addressed             This Visit's Progress    Health Management-HTN, HLD       Patient Goals/Self Care Activities: -Patient/Caregiver will take medications as prescribed   -Patient/Caregiver will attend all scheduled provider appointments -Patient/Caregiver will call provider office for new concerns or questions   -Calls provider office for new concerns, questions, or BP outside discussed parameters -Checks BP and records as discussed -Follows a low sodium diet/DASH diet  Provided education to patient re: Hyperlipidemia;   Patient doing pretty good. She reports dealing with arthritis of left shoulder but manages with pain medication.  She reports last blood pressure 144/77.  Discussed HTN management.  Patient reports her cholesterol control. She reports that the heart doctor gave her repatha but states she cannot tolerate it and will be talking to physician about other alternatives such as fish oil to assist. Patient also on Zetia for cholesterol.  Discussed low cholesterol diet to help as well.  She verbalized understanding.  No RN CM concerns.         SDOH assessments and interventions completed:  Yes  SDOH Interventions Today    Flowsheet Row Most Recent Value  SDOH Interventions   Food Insecurity Interventions Intervention Not Indicated  Housing Interventions Intervention Not Indicated  Transportation Interventions Intervention Not Indicated  Utilities Interventions Intervention Not Indicated  Health Literacy Interventions Intervention Not Indicated        Care Coordination Interventions:  Yes, provided    Follow up plan: Follow up call scheduled for February    Encounter Outcome:  Patient Visit Completed   Bary Leriche, RN, MSN RN Care Manager [facility], Population Health Direct Dial: [phone]  Fax: [phone] Website: [email or web address]

## 2023-07-07 NOTE — Patient Instructions (Signed)
Visit Information  Thank you for taking time to visit with me today. Please don't hesitate to contact me if I can be of assistance to you.   Following are the goals we discussed today:   Goals Addressed             This Visit's Progress    Health Management-HTN, HLD       Patient Goals/Self Care Activities: -Patient/Caregiver will take medications as prescribed   -Patient/Caregiver will attend all scheduled provider appointments -Patient/Caregiver will call provider office for new concerns or questions   -Calls provider office for new concerns, questions, or BP outside discussed parameters -Checks BP and records as discussed -Follows a low sodium diet/DASH diet  Provided education to patient re: Hyperlipidemia;   Patient doing pretty good. She reports dealing with arthritis of left shoulder but manages with pain medication.  She reports last blood pressure 144/77.  Discussed HTN management.  Patient reports her cholesterol control. She reports that the heart doctor gave her repatha but states she cannot tolerate it and will be talking to physician about other alternatives such as fish oil to assist. Patient also on Zetia for cholesterol.  Discussed low cholesterol diet to help as well.  She verbalized understanding.  No RN CM concerns.         Our next appointment is by telephone on 09/08/23 at 200 pm  Please call the care guide team at [phone] if you need to cancel or reschedule your appointment.   If you are experiencing a Mental Health or Behavioral Health Crisis or need someone to talk to, please call the Suicide and Crisis Lifeline: 988   Patient verbalizes understanding of instructions and care plan provided today and agrees to view in MyChart. Active MyChart status and patient understanding of how to access instructions and care plan via MyChart confirmed with patient.     The patient has been provided with contact information for the care management team and has been  advised to call with any health related questions or concerns.   Bary Leriche, RN, MSN RN Care Manager [facility], Population Health Direct Dial: [phone]  Fax: [phone] Website: [email or web address]

## 2023-07-07 NOTE — Telephone Encounter (Signed)
Lvm for pt to call to schedule a f/u appt.

## 2023-07-22 ENCOUNTER — Other Ambulatory Visit (HOSPITAL_COMMUNITY): Payer: Self-pay

## 2023-07-25 ENCOUNTER — Other Ambulatory Visit: Payer: Self-pay | Admitting: Nurse Practitioner

## 2023-07-25 ENCOUNTER — Other Ambulatory Visit (HOSPITAL_COMMUNITY): Payer: Self-pay

## 2023-07-25 ENCOUNTER — Other Ambulatory Visit (HOSPITAL_BASED_OUTPATIENT_CLINIC_OR_DEPARTMENT_OTHER): Payer: Self-pay

## 2023-07-25 DIAGNOSIS — E876 Hypokalemia: Secondary | ICD-10-CM

## 2023-07-26 ENCOUNTER — Encounter (HOSPITAL_COMMUNITY): Payer: Self-pay

## 2023-07-26 ENCOUNTER — Other Ambulatory Visit (HOSPITAL_COMMUNITY): Payer: Self-pay

## 2023-08-02 ENCOUNTER — Other Ambulatory Visit (HOSPITAL_COMMUNITY): Payer: Self-pay

## 2023-08-04 ENCOUNTER — Ambulatory Visit: Payer: Self-pay | Admitting: Nurse Practitioner

## 2023-08-04 ENCOUNTER — Other Ambulatory Visit (HOSPITAL_COMMUNITY): Payer: Self-pay

## 2023-08-04 NOTE — Telephone Encounter (Signed)
 Made first attempt to triage. No answer, LVM. Will call back.   Copied from CRM [id number]. Topic: Clinical - Medication Question >> Aug 04, 2023  1:28 PM Robinson H wrote: Reason for CRM: Patient is calling asking if provider can call in a prescription for the HYDROcodone -acetaminophen  (NORCO) 7.5-325 MG tablet to the pharmacy  just in case the weather is bad tomorrow and she can't make it in for her medication refill appointment she doesn't want to go the weekend with out her medication. Patient sates she sent provider a message on MyChart, please reach out to patient.  Ronal [phone]

## 2023-08-04 NOTE — Telephone Encounter (Signed)
 Call placed to patient, no answer, left a voicemail, callback queued.

## 2023-08-05 ENCOUNTER — Encounter: Payer: Self-pay | Admitting: Nurse Practitioner

## 2023-08-05 ENCOUNTER — Telehealth: Payer: Self-pay

## 2023-08-05 ENCOUNTER — Ambulatory Visit (INDEPENDENT_AMBULATORY_CARE_PROVIDER_SITE_OTHER): Payer: No Typology Code available for payment source | Admitting: Nurse Practitioner

## 2023-08-05 ENCOUNTER — Other Ambulatory Visit (HOSPITAL_COMMUNITY): Payer: Self-pay

## 2023-08-05 VITALS — BP 128/64 | HR 70 | Temp 97.9°F | Resp 18 | Wt 195.2 lb

## 2023-08-05 DIAGNOSIS — G8929 Other chronic pain: Secondary | ICD-10-CM | POA: Diagnosis not present

## 2023-08-05 DIAGNOSIS — F1120 Opioid dependence, uncomplicated: Secondary | ICD-10-CM | POA: Diagnosis not present

## 2023-08-05 DIAGNOSIS — T502X5A Adverse effect of carbonic-anhydrase inhibitors, benzothiadiazides and other diuretics, initial encounter: Secondary | ICD-10-CM | POA: Diagnosis not present

## 2023-08-05 DIAGNOSIS — I10 Essential (primary) hypertension: Secondary | ICD-10-CM | POA: Diagnosis not present

## 2023-08-05 DIAGNOSIS — M4807 Spinal stenosis, lumbosacral region: Secondary | ICD-10-CM

## 2023-08-05 DIAGNOSIS — Z79899 Other long term (current) drug therapy: Secondary | ICD-10-CM | POA: Diagnosis not present

## 2023-08-05 DIAGNOSIS — M51372 Other intervertebral disc degeneration, lumbosacral region with discogenic back pain and lower extremity pain: Secondary | ICD-10-CM

## 2023-08-05 DIAGNOSIS — E876 Hypokalemia: Secondary | ICD-10-CM | POA: Diagnosis not present

## 2023-08-05 MED ORDER — HYDROCODONE-ACETAMINOPHEN 7.5-325 MG PO TABS
1.0000 | ORAL_TABLET | Freq: Three times a day (TID) | ORAL | 0 refills | Status: DC
Start: 2023-08-05 — End: 2023-10-31
  Filled 2023-08-05: qty 90, 30d supply, fill #0

## 2023-08-05 MED ORDER — HYDROCHLOROTHIAZIDE 25 MG PO TABS
25.0000 mg | ORAL_TABLET | Freq: Every day | ORAL | 1 refills | Status: DC
  Filled 2023-08-05: qty 90, 90d supply, fill #0
  Filled 2024-02-22: qty 90, 90d supply, fill #1

## 2023-08-05 MED ORDER — HYDROCODONE-ACETAMINOPHEN 7.5-325 MG PO TABS
1.0000 | ORAL_TABLET | Freq: Three times a day (TID) | ORAL | 0 refills | Status: DC
  Filled 2023-09-05: qty 90, 30d supply, fill #0

## 2023-08-05 MED ORDER — HYDROCODONE-ACETAMINOPHEN 7.5-325 MG PO TABS
1.0000 | ORAL_TABLET | Freq: Three times a day (TID) | ORAL | 0 refills | Status: DC
Start: 2023-10-03 — End: 2023-10-31
  Filled 2023-10-03: qty 90, 30d supply, fill #0

## 2023-08-05 MED ORDER — POTASSIUM CHLORIDE CRYS ER 20 MEQ PO TBCR
20.0000 meq | EXTENDED_RELEASE_TABLET | Freq: Every day | ORAL | 0 refills | Status: DC
  Filled 2023-08-05: qty 90, 90d supply, fill #0

## 2023-08-05 NOTE — Addendum Note (Signed)
 Addended by: Zada Girt D on: 08/05/2023 11:03 AM   Modules accepted: Orders

## 2023-08-05 NOTE — Assessment & Plan Note (Signed)
 Bp at goal with hydrochlorothiazide , coreg , hydralazine , and amlodipine  Potassium also taken due to hypokalemia Denies any hypotension BP Readings from Last 3 Encounters:  08/05/23 128/64  06/01/23 138/80  05/04/23 138/86    Maintain med doses F/up in 24month

## 2023-08-05 NOTE — Progress Notes (Signed)
 Established Patient Visit  Patient: Heather Shaw   DOB: Dec 11, 1940   83 y.o. Female  MRN: [id number] Visit Date: 08/05/2023  Subjective:    Chief Complaint  Patient presents with   Medical Managment for Chronic Issues     4 month follow up hypertension and chronic pain. RX refills needed    HPI Hypertensive disorder Bp at goal with hydrochlorothiazide , coreg , hydralazine , and amlodipine  Potassium also taken due to hypokalemia Denies any hypotension BP Readings from Last 3 Encounters:  08/05/23 128/64  06/01/23 138/80  05/04/23 138/86    Maintain med doses F/up in 71month  Chronic pain UTD with controlled substance contract Collect UDS today Pain controlled with current dose of hydrocodone  7.5/325mg  1tab TID and gabapentin  200mg  No red flag in PMP database: last filled 05/06/23, 06/06/23, 07/06/23 Denies any constipation or daytime somnolence or confusion She lives alone and independent with ADLs: able to drive No change in med Refilled sent for 08/05/23, 09/10/23, and 10/03/2023 F/up in 71months  Opioid type dependence, continuous (HCC) Stable Denies any adverse effects PMP database reviewed today UDS collected today Contract due for renewal 03/2024 F/up 71months  Reviewed medical, surgical, and social history today  Medications: Outpatient Medications Prior to Visit  Medication Sig Note   amLODipine  (NORVASC ) 10 MG tablet Take 1 tablet (10 mg total) by mouth daily.    carvedilol  (COREG ) 6.25 MG tablet Take 1 tablet (6.25 mg total) by mouth 2 (two) times daily.    Cholecalciferol (VITAMIN D3) 50 MCG (2000 UT) capsule Take 1 capsule (2,000 Units total) by mouth daily.    Evolocumab  (REPATHA  SURECLICK) 140 MG/ML SOAJ Inject 140 mg into the skin every 14 (fourteen) days.    ezetimibe  (ZETIA ) 10 MG tablet Take 1 tablet (10 mg total) by mouth daily.    fluocinonide -emollient (LIDEX -E) 0.05 % cream Apply 1 Application topically 2 (two) times daily as  needed.    gabapentin  (NEURONTIN ) 100 MG capsule Take 2 capsules (200 mg total) by mouth at bedtime.    hydrALAZINE  (APRESOLINE ) 25 MG tablet Take 1 tablet (25 mg total) by mouth 2 (two) times daily.    meclizine  (ANTIVERT ) 25 MG tablet Take 25 mg by mouth 3 (three) times daily as needed for dizziness.    Meth-Hyo-M Bl-Na Phos-Ph Sal (URO-MP) 118 MG CAPS Take 1 capsule by mouth 3 (three) times daily as needed.    [DISCONTINUED] hydrochlorothiazide  (HYDRODIURIL ) 25 MG tablet Take 1 tablet (25 mg total) by mouth daily.    [DISCONTINUED] HYDROcodone -acetaminophen  (NORCO) 7.5-325 MG tablet Take 1 tablet by mouth every 8 (eight) hours. Need office visit for additional refills. 08/05/2023: Refills needed    [DISCONTINUED] potassium chloride  SA (KLOR-CON  M) 20 MEQ tablet Take 1 tablet (20 mEq total) by mouth daily.    No facility-administered medications prior to visit.   Reviewed past medical and social history.   ROS per HPI above      Objective:  BP 128/64 (Cuff Size: Normal)   Pulse 70   Temp 97.9 F (36.6 C) (Temporal)   Resp 18   Wt 195 lb 3.2 oz (88.5 kg)   SpO2 97%   BMI 32.48 kg/m      Physical Exam Vitals and nursing note reviewed.  Cardiovascular:     Rate and Rhythm: Normal rate.     Pulses: Normal pulses.  Pulmonary:     Effort: Pulmonary effort is normal.  Neurological:  Mental Status: She is alert and oriented to person, place, and time.  Psychiatric:        Mood and Affect: Mood normal.        Behavior: Behavior normal.        Thought Content: Thought content normal.     No results found for any visits on 08/05/23.    Assessment & Plan:    Problem List Items Addressed This Visit     Chronic pain   UTD with controlled substance contract Collect UDS today Pain controlled with current dose of hydrocodone  7.5/325mg  1tab TID and gabapentin  200mg  No red flag in PMP database: last filled 05/06/23, 06/06/23, 07/06/23 Denies any constipation or daytime  somnolence or confusion She lives alone and independent with ADLs: able to drive No change in med Refilled sent for 08/05/23, 09/10/23, and 10/03/2023 F/up in 109months      Relevant Medications   HYDROcodone -acetaminophen  (NORCO) 7.5-325 MG tablet   HYDROcodone -acetaminophen  (NORCO) 7.5-325 MG tablet (Start on 09/05/2023)   HYDROcodone -acetaminophen  (NORCO) 7.5-325 MG tablet (Start on 10/03/2023)   Other Relevant Orders   ToxAssure Select,+Antidepr,UR   Controlled substance agreement signed   Relevant Medications   HYDROcodone -acetaminophen  (NORCO) 7.5-325 MG tablet   HYDROcodone -acetaminophen  (NORCO) 7.5-325 MG tablet (Start on 09/05/2023)   HYDROcodone -acetaminophen  (NORCO) 7.5-325 MG tablet (Start on 10/03/2023)   Other Relevant Orders   ToxAssure Select,+Antidepr,UR   Hypertensive disorder - Primary   Bp at goal with hydrochlorothiazide , coreg , hydralazine , and amlodipine  Potassium also taken due to hypokalemia Denies any hypotension BP Readings from Last 3 Encounters:  08/05/23 128/64  06/01/23 138/80  05/04/23 138/86    Maintain med doses F/up in 109month      Relevant Medications   hydrochlorothiazide  (HYDRODIURIL ) 25 MG tablet   Opioid type dependence, continuous (HCC)   Stable Denies any adverse effects PMP database reviewed today UDS collected today Contract due for renewal 03/2024 F/up 109months      Relevant Medications   HYDROcodone -acetaminophen  (NORCO) 7.5-325 MG tablet   HYDROcodone -acetaminophen  (NORCO) 7.5-325 MG tablet (Start on 09/05/2023)   HYDROcodone -acetaminophen  (NORCO) 7.5-325 MG tablet (Start on 10/03/2023)   Other Visit Diagnoses       DDD (degenerative disc disease), lumbosacral       Relevant Medications   HYDROcodone -acetaminophen  (NORCO) 7.5-325 MG tablet   HYDROcodone -acetaminophen  (NORCO) 7.5-325 MG tablet (Start on 09/05/2023)   HYDROcodone -acetaminophen  (NORCO) 7.5-325 MG tablet (Start on 10/03/2023)     Spinal stenosis of lumbosacral  region       Relevant Medications   HYDROcodone -acetaminophen  (NORCO) 7.5-325 MG tablet   HYDROcodone -acetaminophen  (NORCO) 7.5-325 MG tablet (Start on 09/05/2023)   HYDROcodone -acetaminophen  (NORCO) 7.5-325 MG tablet (Start on 10/03/2023)     Diuretic-induced hypokalemia       Relevant Medications   potassium chloride  SA (KLOR-CON  M) 20 MEQ tablet      Return in about 3 months (around 11/03/2023) for HTN and chronic pain management.     Roselie Mood, NP

## 2023-08-05 NOTE — Patient Instructions (Signed)
 Go to lab

## 2023-08-05 NOTE — Telephone Encounter (Signed)
 Concerns and RX refills will be address on today's office visit.

## 2023-08-05 NOTE — Assessment & Plan Note (Signed)
 Stable Denies any adverse effects PMP database reviewed today UDS collected today Contract due for renewal 03/2024 F/up 3months

## 2023-08-05 NOTE — Assessment & Plan Note (Signed)
 UTD with controlled substance contract Collect UDS today Pain controlled with current dose of hydrocodone  7.5/325mg  1tab TID and gabapentin  200mg  No red flag in PMP database: last filled 05/06/23, 06/06/23, 07/06/23 Denies any constipation or daytime somnolence or confusion She lives alone and independent with ADLs: able to drive No change in med Refilled sent for 08/05/23, 09/10/23, and 10/03/2023 F/up in 3months

## 2023-08-05 NOTE — Patient Outreach (Signed)
  Care Coordination   In Person Provider Office Visit Note   08/05/2023 Name: Heather Shaw MRN: [id number] DOB: 12-16-1940  Heather Shaw is a 83 y.o. year old female who sees [name], Roselie Rockford, NP for primary care. I engaged with Heather Shaw in the providers office today.  What matters to the patients health and wellness today?  Maintain health    Goals Addressed             This Visit's Progress    Health Management-HTN, HLD       Patient Goals/Self Care Activities: -Patient/Caregiver will take medications as prescribed   -Patient/Caregiver will attend all scheduled provider appointments -Patient/Caregiver will call provider office for new concerns or questions   -Calls provider office for new concerns, questions, or BP outside discussed parameters -Checks BP and records as discussed -Follows a low sodium diet/DASH diet  Provided education to patient re: Hyperlipidemia;   Patient noted to have appointment while CM in PCP office. Met with patient, she expressed appreciation to put a face with a name.  She is in for routine follow up. No specific compliant. Encouraged to continue HTN and Hyperlipidemia Management including diet limitations. She verbalized understanding. No RN CM concerns.         SDOH assessments and interventions completed:  Yes     Care Coordination Interventions:  Yes, provided   Follow up plan: Follow up call scheduled for February    Encounter Outcome:  Patient Visit Completed   Ebba Goll J Dan Scearce, RN, MSN RN Care Manager [facility], Population Health Direct Dial: [phone]  Fax: [phone] Website: [email or web address]

## 2023-08-05 NOTE — Patient Instructions (Signed)
 Visit Information  Thank you for taking time to visit with me today. Please don't hesitate to contact me if I can be of assistance to you.   Following are the goals we discussed today:   Goals Addressed             This Visit's Progress    Health Management-HTN, HLD       Patient Goals/Self Care Activities: -Patient/Caregiver will take medications as prescribed   -Patient/Caregiver will attend all scheduled provider appointments -Patient/Caregiver will call provider office for new concerns or questions   -Calls provider office for new concerns, questions, or BP outside discussed parameters -Checks BP and records as discussed -Follows a low sodium diet/DASH diet  Provided education to patient re: Hyperlipidemia;   Patient noted to have appointment while CM in PCP office. Met with patient, she expressed appreciation to put a face with a name.  She is in for routine follow up. No specific compliant. Encouraged to continue HTN and Hyperlipidemia Management including diet limitations. She verbalized understanding. No RN CM concerns.         Our next appointment is by telephone on 09/08/23 at 200 pm  Please call the care guide team at [phone] if you need to cancel or reschedule your appointment.   If you are experiencing a Mental Health or Behavioral Health Crisis or need someone to talk to, please call the Suicide and Crisis Lifeline: 988   Patient verbalizes understanding of instructions and care plan provided today and agrees to view in MyChart. Active MyChart status and patient understanding of how to access instructions and care plan via MyChart confirmed with patient.     The patient has been provided with contact information for the care management team and has been advised to call with any health related questions or concerns.   Windi Toro J Everline Mahaffy, RN, MSN RN Care Manager [facility], Population Health Direct Dial: [phone]  Fax:  [phone] Website: [email or web address]

## 2023-08-08 ENCOUNTER — Other Ambulatory Visit: Payer: No Typology Code available for payment source

## 2023-08-08 DIAGNOSIS — Z79899 Other long term (current) drug therapy: Secondary | ICD-10-CM

## 2023-08-08 DIAGNOSIS — G8929 Other chronic pain: Secondary | ICD-10-CM

## 2023-08-10 ENCOUNTER — Ambulatory Visit: Payer: Medicare Other | Admitting: Nurse Practitioner

## 2023-08-10 LAB — TOXASSURE SELECT,+ANTIDEPR,UR

## 2023-08-25 DIAGNOSIS — H40013 Open angle with borderline findings, low risk, bilateral: Secondary | ICD-10-CM | POA: Diagnosis not present

## 2023-08-29 ENCOUNTER — Ambulatory Visit: Payer: Medicare Other | Admitting: Nurse Practitioner

## 2023-09-01 ENCOUNTER — Other Ambulatory Visit (HOSPITAL_COMMUNITY): Payer: Self-pay

## 2023-09-05 ENCOUNTER — Other Ambulatory Visit (HOSPITAL_COMMUNITY): Payer: Self-pay

## 2023-09-05 ENCOUNTER — Other Ambulatory Visit: Payer: Self-pay

## 2023-09-08 ENCOUNTER — Ambulatory Visit: Payer: Self-pay

## 2023-09-08 ENCOUNTER — Ambulatory Visit: Payer: No Typology Code available for payment source | Admitting: Gastroenterology

## 2023-09-08 NOTE — Patient Instructions (Signed)
Visit Information  Thank you for taking time to visit with me today. Please don't hesitate to contact me if I can be of assistance to you.   Following are the goals we discussed today:   Goals Addressed             This Visit's Progress    Health Management-HTN, HLD, and Athritis       Patient Goals/Self Care Activities: -Patient/Caregiver will take medications as prescribed   -Patient/Caregiver will attend all scheduled provider appointments -Patient/Caregiver will call provider office for new concerns or questions   -Calls provider office for new concerns, questions, or BP outside discussed parameters -Checks BP and records as discussed -Follows a low sodium diet/DASH diet  Provided education to patient re: Hyperlipidemia;    Patient reports she is doing pretty good.  Continues to maintain health. Blood pressure last check 146/81.  Reviewed HTN Management. She deals with arthritis to lower back and shoulders.  She manages with pain medication and heating pad. She has decided to go forward with one last colonoscopy.  Nurse appointment 10/25/23.  No RN CM concerns.         Our next appointment is by telephone on 11/10/23 at 200 pm  Please call the care guide team at [phone] if you need to cancel or reschedule your appointment.   If you are experiencing a Mental Health or Behavioral Health Crisis or need someone to talk to, please call the Suicide and Crisis Lifeline: 988   Patient verbalizes understanding of instructions and care plan provided today and agrees to view in MyChart. Active MyChart status and patient understanding of how to access instructions and care plan via MyChart confirmed with patient.     The patient has been provided with contact information for the care management team and has been advised to call with any health related questions or concerns.   Bary Leriche RN, MSN [facility], [facility] RN Care Manager Direct  Dial: [phone]  Fax: [phone] Website: [email or web address]

## 2023-09-08 NOTE — Patient Outreach (Signed)
  Care Coordination   Follow Up Visit Note   09/08/2023 Name: Heather Shaw MRN: [id number] DOB: 11/15/1940  Heather Shaw is a 83 y.o. year old female who sees Heather Shaw, Heather Gains, NP for primary care. I spoke with  Heather Shaw by phone today.  What matters to the patients health and wellness today?  Maintaining health    Goals Addressed             This Visit's Progress    Health Management-HTN, HLD, and Athritis       Patient Goals/Self Care Activities: -Patient/Caregiver will take medications as prescribed   -Patient/Caregiver will attend all scheduled provider appointments -Patient/Caregiver will call provider office for new concerns or questions   -Calls provider office for new concerns, questions, or BP outside discussed parameters -Checks BP and records as discussed -Follows a low sodium diet/DASH diet  Provided education to patient re: Hyperlipidemia;    Patient reports she is doing pretty good.  Continues to maintain health. Blood pressure last check 146/81.  Reviewed HTN Management. She deals with arthritis to lower back and shoulders.  She manages with pain medication and heating pad. She has decided to go forward with one last colonoscopy.  Nurse appointment 10/25/23.  No RN CM concerns.         SDOH assessments and interventions completed:  Yes     Care Coordination Interventions:  Yes, provided   Follow up plan: Follow up call scheduled for April    Encounter Outcome:  Patient Visit Completed   Heather Leriche RN, MSN [facility], [facility] RN Care Manager Direct Dial: [phone]  Fax: [phone] Website: [email or web address]

## 2023-09-30 ENCOUNTER — Other Ambulatory Visit (HOSPITAL_COMMUNITY): Payer: Self-pay

## 2023-10-03 ENCOUNTER — Other Ambulatory Visit (HOSPITAL_COMMUNITY): Payer: Self-pay

## 2023-10-05 ENCOUNTER — Telehealth: Payer: Self-pay

## 2023-10-05 NOTE — Patient Outreach (Signed)
 Care Coordination   Follow Up Visit Note   10/05/2023 Name: Heather Shaw MRN: [id number] DOB: Aug 08, 1940  Heather Shaw is a 83 y.o. year old female who sees [name], [name], NP for primary care. I spoke with  [name] by phone today.  What matters to the patients health and wellness today?  Get to the bottom of Chronic Interstitial Cystitis    Goals Addressed             This Visit's Progress    Health Management-HTN, HLD, and Arthritis       Patient Goals/Self Care Activities: -Patient/Caregiver will take medications as prescribed   -Patient/Caregiver will attend all scheduled provider appointments -Patient/Caregiver will call provider office for new concerns or questions   -Calls provider office for new concerns, questions, or BP outside discussed parameters -Checks BP and records as discussed -Follows a low sodium diet/DASH diet  Provided education to patient re: Hyperlipidemia;    Patient reports she is having some problems with her Chronic Interstitial Cystitis.  She has an appointment Friday with urology and states she may need a cystoscopy. However, she will wait to see.  Blood pressure last check 146/92.  Reiterated HTN Management. She deals with arthritis to lower back and shoulders.  She manages with pain medication and heating pad. She has decided to go forward with one last colonoscopy.  Nurse appointment 10/25/23.  No RN CM concerns.         SDOH assessments and interventions completed:  Yes     Care Coordination Interventions:  Yes, provided   Follow up plan: Follow up call scheduled for april    Encounter Outcome:  Patient Visit Completed   Bary Leriche RN, MSN [facility], [facility] RN Care Manager Direct Dial: [phone]  Fax: [phone] Website: [email or web address]

## 2023-10-05 NOTE — Patient Instructions (Signed)
 Visit Information  Thank you for taking time to visit with me today. Please don't hesitate to contact me if I can be of assistance to you.   Following are the goals we discussed today:   Goals Addressed             This Visit's Progress    Health Management-HTN, HLD, and Arthritis       Patient Goals/Self Care Activities: -Patient/Caregiver will take medications as prescribed   -Patient/Caregiver will attend all scheduled provider appointments -Patient/Caregiver will call provider office for new concerns or questions   -Calls provider office for new concerns, questions, or BP outside discussed parameters -Checks BP and records as discussed -Follows a low sodium diet/DASH diet  Provided education to patient re: Hyperlipidemia;    Patient reports she is having some problems with her Chronic Interstitial Cystitis.  She has an appointment Friday with urology and states she may need a cystoscopy. However, she will wait to see.  Blood pressure last check 146/92.  Reiterated HTN Management. She deals with arthritis to lower back and shoulders.  She manages with pain medication and heating pad. She has decided to go forward with one last colonoscopy.  Nurse appointment 10/25/23.  No RN CM concerns.         Your next appointment is by telephone on 11/10/23 at 100 pm  Please call the care guide team at [phone] if you need to cancel or reschedule your appointment.   If you are experiencing a Mental Health or Behavioral Health Crisis or need someone to talk to, please call the Suicide and Crisis Lifeline: 988   Patient verbalizes understanding of instructions and care plan provided today and agrees to view in MyChart. Active MyChart status and patient understanding of how to access instructions and care plan via MyChart confirmed with patient.     The patient has been provided with contact information for the care management team and has been advised to call with any health related  questions or concerns.   Bary Leriche RN, MSN [facility], [facility] RN Care Manager Direct Dial: [phone]  Fax: [phone] Website: [email or web address]

## 2023-10-07 DIAGNOSIS — R300 Dysuria: Secondary | ICD-10-CM | POA: Diagnosis not present

## 2023-10-07 DIAGNOSIS — N3010 Interstitial cystitis (chronic) without hematuria: Secondary | ICD-10-CM | POA: Diagnosis not present

## 2023-10-10 DIAGNOSIS — E782 Mixed hyperlipidemia: Secondary | ICD-10-CM | POA: Diagnosis not present

## 2023-10-10 DIAGNOSIS — Z79899 Other long term (current) drug therapy: Secondary | ICD-10-CM | POA: Diagnosis not present

## 2023-10-11 LAB — NMR, LIPOPROFILE
Cholesterol, Total: 192 mg/dL (ref 100–199)
HDL Particle Number: 30.8 umol/L (ref >=30.5)
HDL-C: 50 mg/dL (ref >39)
LDL Particle Number: 1308 nmol/L — ABNORMAL HIGH (ref <1000)
LDL Size: 20.7 nm (ref >20.5)
LDL-C (NIH Calc): 119 mg/dL — ABNORMAL HIGH (ref 0–99)
LP-IR Score: 47 — ABNORMAL HIGH (ref <=45)
Small LDL Particle Number: 528 nmol/L — ABNORMAL HIGH (ref <=527)
Triglycerides: 131 mg/dL (ref 0–149)

## 2023-10-12 NOTE — Progress Notes (Unsigned)
 Cardiology Office Note:  .   Date:  10/14/2023  ID:  Heather Shaw, DOB [birth year], MRN [id number] PCP: Anne Ng, NP  [facility] Providers Cardiologist:  Charlton Haws, MD    Patient Profile: .      PMH Dyslipidemia Hypertension Statin intolerance to multiple agents Repatha intolerance Family history of heart disease and blood clots Former tobacco abuse Interstitial cystitis Aortic atherosclerosis  Referred to Advanced lipid disorder clinic and seen by Dr. Rennis Golden 02/08/2023.  Most recent lipid panel revealed total cholesterol 209, triglycerides 179, HDL 42, and LDL 130.  She has not been able to tolerate statin therapy.  She has also had difficulty managing blood pressure.  BP was elevated and she had tachycardia felt to be related to recently starting hydralazine.  She was advised to start carvedilol 6.25 mg twice daily and to start ezetimibe 10 mg daily.  3 to 79-month follow-up was recommended.  Seen by  me on 06/01/2023 for follow-up of hyperlipidemia and hypertension. Home BP readings fluctuating but she admitted inconsistent medication schedule, sometimes sleeping late or falling asleep without taking medications. We reviewed recent cholesterol results with LDL of 131 and an LDL particle number of 1622, both above the desired levels. Triglycerides have improved from 179 to 134. Family history of blood clots and heart disease. Her mother died at age 72 from blood clots in her legs, and her sister has recently been diagnosed with a blood clot in her leg. She has many dietary restrictions and challenges secondary to interstitial cystitis. She reports significant pain at times due to bladder pain as well as chronic back and knee pain. She avoids fruit and any liquids other than water, to prevent flare-ups of the condition. She follows a low-sodium diet. No concerning cardiac symptoms.  She was advised to add Repatha 140 mg every 14 days for additional lipid management.      .  History of Present Illness: Marland Kitchen   Heather Shaw is a pleasant 83 y.o. female who is here today for follow-up of hyperlipidemia and hypertension.  NMR completed 10/10/2023 revealed LDL particle #1308, LDL-C 409, triglycerides 131, HDL-C 50, total cholesterol 192, and small LDL-P 528. She reports intolerance of Repatha. After her first injection, she experienced systemic symptoms resembling arthritis and exacerbation of interstitial cystitis, leading to pain and swelling in the lower abdomen. She did not continue with further injections, and symptoms subsided. Feels that she is tolerating ezetimibe without side effects. She occasionally experiences episodes of discomfort after eating, described as resembling a heart attack, relieved by antacids and walking.  She feels better after belching. No chest pain, shortness of breath, or palpations with exertion.  Has a limited diet due to interstitial cystitis.  Eats chicken, salads with olive oil, vegetables out seasoning, pears and blueberries are only fruits she can tolerate.    Discussed the use of AI scribe software for clinical note transcription with the patient, who gave verbal consent to proceed.   ROS: See HPI       Studies Reviewed: Marland Kitchen       Lipoprotein (a)  Date/Time Value Ref Range Status  05/27/2023 11:30 AM 84.5 (H) <75.0 nmol/L Final    Comment:    Note:  Values greater than or equal to 75.0 nmol/L may        indicate an independent risk factor for CHD,        but must be evaluated with caution when applied  to non-Caucasian populations due to the        influence of genetic factors on Lp(a) across        ethnicities.       Risk Assessment/Calculations:            Physical Exam:   VS:  BP 132/74 (BP Location: Left Arm, Patient Position: Sitting, Cuff Size: Large)   Pulse 64   Resp 16   Ht 5\' 5"  (1.651 m)   Wt 200 lb (90.7 kg)   SpO2 96%   BMI 33.28 kg/m    Wt Readings from Last 3 Encounters:  10/13/23 200 lb  (90.7 kg)  08/05/23 195 lb 3.2 oz (88.5 kg)  06/01/23 200 lb 3.2 oz (90.8 kg)    GEN: Well nourished, well developed in no acute distress NECK: No JVD; No carotid bruits CARDIAC: RRR, no murmurs, rubs, gallops RESPIRATORY:  Clear to auscultation without rales, wheezing or rhonchi  ABDOMEN: Soft, non-tender, non-distended EXTREMITIES:  No edema; No deformity     ASSESSMENT AND PLAN: .    Dyslipidemia LDL goal < 70: LP(a) is not elevated. Elevated LDL cholesterol 131(goal <100) and LDL particle number (1622, goal <1000). Family history of blood clots and heart disease. She has tried multiple statins but experienced intolerable side effects. Currently on ezetimibe with some improvement in triglycerides (134, down from 179). She did not tolerate Repatha after one injection. Encouraged increased physical activity including chair exercises and walking or cycling. Encouraged heart healthy diet avoiding saturated fat, processed foods, simple carbohydrates, and sugar. We will see if she is a candidate for Nexlizet and if she starts this, we will check NMR in 2-3 months. Continue ezetimibe.    Aortic atherosclerosis: Chest discomfort after eating that improves with belching atypical for angina.  No indication for further ischemia evaluation. As noted above, goal LDL < 70.   Hypertension:  BP is better controlled. Renal function stable on labs completed 04/2023. Continue anti-hypertensive therapy including amlodipine, carvedilol, hydralazine.        Disposition: 6 months with Dr. Rennis Golden or me  Signed, Eligha Bridegroom, NP-C

## 2023-10-13 ENCOUNTER — Encounter: Payer: Self-pay | Admitting: Nurse Practitioner

## 2023-10-13 ENCOUNTER — Ambulatory Visit: Payer: Medicare Other | Attending: Nurse Practitioner | Admitting: Nurse Practitioner

## 2023-10-13 VITALS — BP 132/74 | HR 64 | Resp 16 | Ht 65.0 in | Wt 200.0 lb

## 2023-10-13 DIAGNOSIS — T466X5D Adverse effect of antihyperlipidemic and antiarteriosclerotic drugs, subsequent encounter: Secondary | ICD-10-CM | POA: Diagnosis not present

## 2023-10-13 DIAGNOSIS — M791 Myalgia, unspecified site: Secondary | ICD-10-CM | POA: Diagnosis not present

## 2023-10-13 DIAGNOSIS — I10 Essential (primary) hypertension: Secondary | ICD-10-CM | POA: Diagnosis not present

## 2023-10-13 DIAGNOSIS — E782 Mixed hyperlipidemia: Secondary | ICD-10-CM | POA: Diagnosis not present

## 2023-10-13 DIAGNOSIS — I700 Atherosclerosis of aorta: Secondary | ICD-10-CM | POA: Diagnosis not present

## 2023-10-13 MED ORDER — NEXLETOL 180 MG PO TABS: 180.0000 mg | ORAL_TABLET | Freq: Every day | ORAL | 11 refills | Status: DC

## 2023-10-13 NOTE — Patient Instructions (Signed)
 Medication Instructions:   START Nexletol one (1 ) tablet by mouth ( 180 mg ) daily.   *If you need a refill on your cardiac medications before your next appointment, please call your pharmacy*   Lab Work:  If you start the new medication I will call you next week and order fasting lab work in 2-3 months after starting new medication.  If you have labs (blood work) drawn today and your tests are completely normal, you will receive your results only by: MyChart Message (if you have MyChart) OR A paper copy in the mail If you have any lab test that is abnormal or we need to change your treatment, we will call you to review the results.   Testing/Procedures:  None ordered.   Follow-Up: At [facility], you and your health needs are our priority.  As part of our continuing mission to provide you with exceptional heart care, we have created designated Provider Care Teams.  These Care Teams include your primary Cardiologist (physician) and Advanced Practice Providers (APPs -  Physician Assistants and Nurse Practitioners) who all work together to provide you with the care you need, when you need it.  We recommend signing up for the patient portal called "MyChart".  Sign up information is provided on this After Visit Summary.  MyChart is used to connect with patients for Virtual Visits (Telemedicine).  Patients are able to view lab/test results, encounter notes, upcoming appointments, etc.  Non-urgent messages can be sent to your provider as well.   To learn more about what you can do with MyChart, go to [email or web address].    Your next appointment:   6 month(s)  Provider:   K. Italy Hilty, MD or Eligha Bridegroom, NP    Other Instructions  Your physician wants you to follow-up in: 6 months.  You will receive a reminder letter in the mail two months in advance. If you don't receive a letter, please call our office to schedule the follow-up appointment.

## 2023-10-14 ENCOUNTER — Encounter: Payer: Self-pay | Admitting: Nurse Practitioner

## 2023-10-20 NOTE — Telephone Encounter (Signed)
 LVM for pt to see if pt started Nexletol. Will call back tomorrow.

## 2023-10-20 NOTE — Telephone Encounter (Signed)
-----   Message from [facility] Senia Even G sent at 10/13/2023  4:12 PM EDT ----- Please call pt back next week to see if pt start Nexletol, if she has order Nmr in 2-3 months.

## 2023-10-24 ENCOUNTER — Other Ambulatory Visit (HOSPITAL_COMMUNITY): Payer: Self-pay

## 2023-10-24 ENCOUNTER — Telehealth: Payer: Self-pay | Admitting: *Deleted

## 2023-10-24 ENCOUNTER — Telehealth: Payer: Self-pay | Admitting: Pharmacy Technician

## 2023-10-24 NOTE — Telephone Encounter (Signed)
 S/w pt could not afford the nexletol, pt checked at [facility] over 300 dollars monthly. Pt stated would continue zetia. Will send to [facility] to [place].

## 2023-10-24 NOTE — Telephone Encounter (Signed)
 Patient Advocate Encounter   The patient was approved for a [employer] grant that will help cover the cost of Nexletol Total amount awarded, $2500.00.  Effective: 09/24/23 - 09/22/24   [id number] [id number]  [employer] ID: [id number]   Pharmacy provided with approval and processing information. Patient informed via mychart

## 2023-10-24 NOTE — Telephone Encounter (Signed)
 Will you guys check to see if there is anything we can do to help with cost of Nexlizet, she reports its $300. Intolerant to statins and Repatha.  Thank you, Jacqualine Weichel

## 2023-10-24 NOTE — Telephone Encounter (Signed)
-----   Message from [facility] Senia Even G sent at 10/13/2023  4:12 PM EDT ----- Please call pt back next week to see if pt start Nexletol, if she has order Nmr in 2-3 months.

## 2023-10-25 ENCOUNTER — Other Ambulatory Visit (HOSPITAL_COMMUNITY): Payer: Self-pay

## 2023-10-25 ENCOUNTER — Ambulatory Visit (INDEPENDENT_AMBULATORY_CARE_PROVIDER_SITE_OTHER): Payer: No Typology Code available for payment source | Admitting: Gastroenterology

## 2023-10-25 ENCOUNTER — Encounter: Payer: Self-pay | Admitting: Gastroenterology

## 2023-10-25 VITALS — BP 134/68 | HR 56 | Ht 63.0 in | Wt 198.5 lb

## 2023-10-25 DIAGNOSIS — K5900 Constipation, unspecified: Secondary | ICD-10-CM | POA: Insufficient documentation

## 2023-10-25 DIAGNOSIS — K649 Unspecified hemorrhoids: Secondary | ICD-10-CM | POA: Diagnosis not present

## 2023-10-25 DIAGNOSIS — Z1211 Encounter for screening for malignant neoplasm of colon: Secondary | ICD-10-CM

## 2023-10-25 MED ORDER — NA SULFATE-K SULFATE-MG SULF 17.5-3.13-1.6 GM/177ML PO SOLN
1.0000 | Freq: Once | ORAL | 0 refills | Status: AC
  Filled 2023-10-25: qty 354, 2d supply, fill #0
  Filled 2023-11-29: qty 354, 1d supply, fill #0

## 2023-10-25 NOTE — Progress Notes (Signed)
 10/25/2023 Heather Shaw [id number] November 03, 1940   HISTORY OF PRESENT ILLNESS: This is an 83 year old female who is a patient of Dr. Lauro Franklin.  Limited PHM as listed below.  She is here today to discuss a colonoscopy.  Last was in 2012 by Dr. Kinnie Scales.  She does admit to some constipation, but has been using a couple of stool softeners every other day or so and feels that that is helping.  She does take some pain medication os knows that probably contributes to the constipation.  She does admit to a hemorrhoid that protrudes with bowel movements.  No bleeding or pain.  She reports a lot of gas.  No abdominal pain.   Past Medical History:  Diagnosis Date   Arthritis    Bladder pain    Bradycardia    DDD (degenerative disc disease), lumbosacral    Grief 05/24/2018   Hyperlipidemia    Hyperplastic colon polyp    Hypertension    IC (interstitial cystitis)    Insomnia 04/06/2010   Qualifier: Diagnosis of  By: Dayton Martes MD, Talia     Internal hemorrhoids    Polyp of rectum    PONV (postoperative nausea and vomiting)    Right wrist tendonitis 08/24/2017   Squamous papilloma    in esophageal polyp   Uterine fibroid    Vertigo    Wears glasses    Wears partial dentures    Past Surgical History:  Procedure Laterality Date   CARPAL TUNNEL RELEASE Bilateral    COLONOSCOPY W/ POLYPECTOMY  07/27/2007   CYSTO WITH HYDRODISTENSION N/A 05/20/2014   Procedure: CYSTOSCOPY/HYDRODISTENSION, INSTILLATION OF CHLORPACTIN;  Surgeon: Valetta Fuller, MD;  Location: [facility];  Service: Urology;  Laterality: N/A;   CYSTO/  HYDRODISTENTION/  INSTILLATION CLORPACTIC  09/21/2010   DILATION AND CURETTAGE OF UTERUS  07/26/1986   KNEE ARTHROSCOPY Right 11/18/2009   TRIGGER FINGER RELEASE Right 08/23/2016   Procedure: RIGHT RING FINGER TRIGGER RELEASE;  Surgeon: Mack Hook, MD;  Location: [facility];  Service: Orthopedics;  Laterality: Right;    reports that she quit  smoking about 52 years ago. Her smoking use included cigarettes. She has never used smokeless tobacco. She reports that she does not drink alcohol and does not use drugs. family history includes COPD in her father; Clotting disorder (age of onset: 41) in her mother; Diabetes in her son; Emphysema in her father; Heart attack (age of onset: 8) in her father; Heart disease in her maternal grandfather, maternal grandmother, paternal grandfather, and paternal grandmother; Hypertension in her brother, maternal grandfather, maternal grandmother, paternal grandfather, paternal grandmother, sister, and son; Stroke in her paternal aunt. Allergies  Allergen Reactions   Contrast Media [Iodinated Contrast Media]     ? angioedema   Lisinopril     ? angioedema   Repatha [Evolocumab] Other (See Comments)    Arthritis like pain   Allegra [Fexofenadine] Other (See Comments)    Makes pt nervous   Codeine Nausea And Vomiting   Losartan Other (See Comments)    myalgia   Nsaids     "stomach on fire feeling"    Prednisone     Causes elevation in blood pressure- ? All steroids cause same reaction   Pseudoephedrine Hcl Nausea And Vomiting   Zithromax [Azithromycin] Other (See Comments)    Burns stomach   Sulfa Antibiotics Rash   Sulfur Rash      Outpatient Encounter Medications as of 10/25/2023  Medication Sig  amLODipine (NORVASC) 10 MG tablet Take 1 tablet (10 mg total) by mouth daily.   carvedilol (COREG) 6.25 MG tablet Take 1 tablet (6.25 mg total) by mouth 2 (two) times daily.   Cholecalciferol (VITAMIN D3) 50 MCG (2000 UT) capsule Take 1 capsule (2,000 Units total) by mouth daily.   ezetimibe (ZETIA) 10 MG tablet Take 1 tablet (10 mg total) by mouth daily.   fluocinonide-emollient (LIDEX-E) 0.05 % cream Apply 1 Application topically 2 (two) times daily as needed.   gabapentin (NEURONTIN) 100 MG capsule Take 2 capsules (200 mg total) by mouth at bedtime.   hydrALAZINE (APRESOLINE) 25 MG tablet Take 1  tablet (25 mg total) by mouth 2 (two) times daily.   hydrochlorothiazide (HYDRODIURIL) 25 MG tablet Take 1 tablet (25 mg total) by mouth daily.   HYDROcodone-acetaminophen (NORCO) 7.5-325 MG tablet Take 1 tablet by mouth every 8 (eight) hours. Fill on or after 10/03/2023   meclizine (ANTIVERT) 25 MG tablet Take 25 mg by mouth 3 (three) times daily as needed for dizziness.   Meth-Hyo-M Bl-Na Phos-Ph Sal (URO-MP) 118 MG CAPS Take 1 capsule by mouth 3 (three) times daily as needed.   Na Sulfate-K Sulfate-Mg Sulfate concentrate (SUPREP) 17.5-3.13-1.6 GM/177ML SOLN Take as directed   Bempedoic Acid (NEXLETOL) 180 MG TABS Take 1 tablet (180 mg total) by mouth daily. (Patient not taking: Reported on 10/25/2023)   HYDROcodone-acetaminophen (NORCO) 7.5-325 MG tablet Take 1 tablet by mouth every 8 (eight) hours. (Patient not taking: Reported on 10/25/2023)   HYDROcodone-acetaminophen (NORCO) 7.5-325 MG tablet Take 1 tablet by mouth every 8 (eight) hours. Fill on or after 09/05/2023 (Patient not taking: Reported on 10/25/2023)   [DISCONTINUED] nitrofurantoin, macrocrystal-monohydrate, (MACROBID) 100 MG capsule Take 100 mg by mouth 2 (two) times daily.   No facility-administered encounter medications on file as of 10/25/2023.     REVIEW OF SYSTEMS  : All other systems reviewed and negative except where noted in the History of Present Illness.   PHYSICAL EXAM: BP 134/68 (BP Location: Left Arm, Patient Position: Sitting, Cuff Size: Large)   Pulse (!) 56   Ht 5\' 3"  (1.6 m) Comment: height measured without shoes  Wt 198 lb 8 oz (90 kg)   BMI 35.16 kg/m  General: Well developed white female in no acute distress Head: Normocephalic and atraumatic Eyes:  Sclerae anicteric, conjunctiva pink. Ears: Normal auditory acuity Lungs: Clear throughout to auscultation; no W/R/R. Heart: Regular rate and rhythm; no M/R/G. Rectal:  Will be done at the time of colonoscopy. Musculoskeletal: Symmetrical with no gross  deformities  Skin: No lesions on visible extremities Neurological: Alert oriented x 4, grossly non-focal Psychological:  Alert and cooperative. Normal mood and affect  ASSESSMENT AND PLAN: *CRC screening: Last colonoscopy by Dr. Kinnie Scales in 2012.  Would like to have another colonoscopy.  Will schedule with Dr. Rhea Belton.  Does not have a lot of chronic medical conditions.  Will give a 2-day bowel prep.  The risks, benefits, and alternatives to colonoscopy were discussed with the patient and she consents to proceed.  *Hemorrhoids: Has had hemorrhoid bandings by Dr. Rhea Belton in the past.  Reports having protrusion intermittently with bowel movements. *Constipation: Likely medication induced from her pain medication.  Currently using couple stool softeners every other day which she thinks is helping.  Certainly can do those every day.  Could progress to MiraLAX if needed.  Advised that better treating the constipation would in turn help the hemorrhoid issue.   CC:  Alysia Penna  Quin Hoop, NP

## 2023-10-25 NOTE — Patient Instructions (Addendum)
 You have been scheduled for a colonoscopy. Please follow written instructions given to you at your visit today.   If you use inhalers (even only as needed), please bring them with you on the day of your procedure.  DO NOT TAKE 7 DAYS PRIOR TO TEST- Trulicity (dulaglutide) Ozempic, Wegovy (semaglutide) Mounjaro (tirzepatide) Bydureon Bcise (exanatide extended release)  DO NOT TAKE 1 DAY PRIOR TO YOUR TEST Rybelsus (semaglutide) Adlyxin (lixisenatide) Victoza (liraglutide) Byetta (exanatide) ______________________________________________________________________  _______________________________________________________  If your blood pressure at your visit was 140/90 or greater, please contact your primary care physician to follow up on this.  _______________________________________________________  If you are age 30 or older, your body mass index should be between 23-30. Your Body mass index is 35.16 kg/m. If this is out of the aforementioned range listed, please consider follow up with your Primary Care Provider.  If you are age 46 or younger, your body mass index should be between 19-25. Your Body mass index is 35.16 kg/m. If this is out of the aformentioned range listed, please consider follow up with your Primary Care Provider.   ________________________________________________________  The [facility] providers would like to encourage you to use [facility] to communicate with providers for non-urgent requests or questions.  Due to long hold times on the telephone, sending your provider a message by [facility] may be a faster and more efficient way to get a response.  Please allow 48 business hours for a response.  Please remember that this is for non-urgent requests.  _______________________________________________________

## 2023-10-31 ENCOUNTER — Ambulatory Visit (INDEPENDENT_AMBULATORY_CARE_PROVIDER_SITE_OTHER): Payer: Medicare Other

## 2023-10-31 ENCOUNTER — Encounter: Payer: Self-pay | Admitting: Nurse Practitioner

## 2023-10-31 ENCOUNTER — Other Ambulatory Visit: Payer: Self-pay | Admitting: Nurse Practitioner

## 2023-10-31 ENCOUNTER — Other Ambulatory Visit (HOSPITAL_COMMUNITY): Payer: Self-pay

## 2023-10-31 DIAGNOSIS — Z79899 Other long term (current) drug therapy: Secondary | ICD-10-CM

## 2023-10-31 DIAGNOSIS — G8929 Other chronic pain: Secondary | ICD-10-CM

## 2023-10-31 DIAGNOSIS — Z0000 Encounter for general adult medical examination without abnormal findings: Secondary | ICD-10-CM | POA: Diagnosis not present

## 2023-10-31 DIAGNOSIS — F1120 Opioid dependence, uncomplicated: Secondary | ICD-10-CM

## 2023-10-31 DIAGNOSIS — M51372 Other intervertebral disc degeneration, lumbosacral region with discogenic back pain and lower extremity pain: Secondary | ICD-10-CM

## 2023-10-31 DIAGNOSIS — M4807 Spinal stenosis, lumbosacral region: Secondary | ICD-10-CM

## 2023-10-31 MED ORDER — HYDROCODONE-ACETAMINOPHEN 7.5-325 MG PO TABS
1.0000 | ORAL_TABLET | Freq: Three times a day (TID) | ORAL | 0 refills | Status: DC
Start: 2023-11-01 — End: 2023-11-02
  Filled 2023-11-01: qty 90, 30d supply, fill #0

## 2023-10-31 NOTE — Progress Notes (Signed)
 Subjective:   Heather Shaw is a 83 y.o. who presents for a Medicare Wellness preventive visit.  Visit Complete: Virtual I connected with  Heather Shaw on 10/31/23 by a audio enabled telemedicine application and verified that I am speaking with the correct person using two identifiers.  Patient Location: Home  Provider Location: Office/Clinic  I discussed the limitations of evaluation and management by telemedicine. The patient expressed understanding and agreed to proceed.  Vital Signs: Because this visit was a virtual/telehealth visit, some criteria may be missing or patient reported. Any vitals not documented were not able to be obtained and vitals that have been documented are patient reported.  VideoError- Librarian, academic were attempted between this provider and patient, however failed, due to patient having technical difficulties OR patient did not have access to video capability.  We continued and completed visit with audio only.   Persons Participating in Visit: Patient.  AWV Questionnaire: No: Patient Medicare AWV questionnaire was not completed prior to this visit.  Cardiac Risk Factors include: advanced age (>70men, >11 women);dyslipidemia;hypertension     Objective:    Today's Vitals   10/31/23 1501  PainSc: 7    There is no height or weight on file to calculate BMI.     10/31/2023    3:11 PM 05/02/2023    6:26 AM 10/29/2022    3:00 PM 11/04/2021    1:05 PM 10/14/2021    2:28 PM 04/01/2021    7:28 PM 09/30/2020    1:34 PM  Advanced Directives  Does Patient Have a Medical Advance Directive? No Yes No No No No No  Would patient like information on creating a medical advance directive?    No - Patient declined No - Patient declined  Yes (MAU/Ambulatory/Procedural Areas - Information given)    Current Medications (verified) Outpatient Encounter Medications as of 10/31/2023  Medication Sig   amLODipine (NORVASC) 10 MG tablet Take  1 tablet (10 mg total) by mouth daily.   carvedilol (COREG) 6.25 MG tablet Take 1 tablet (6.25 mg total) by mouth 2 (two) times daily.   fluocinonide-emollient (LIDEX-E) 0.05 % cream Apply 1 Application topically 2 (two) times daily as needed.   gabapentin (NEURONTIN) 100 MG capsule Take 2 capsules (200 mg total) by mouth at bedtime.   hydrALAZINE (APRESOLINE) 25 MG tablet Take 1 tablet (25 mg total) by mouth 2 (two) times daily.   hydrochlorothiazide (HYDRODIURIL) 25 MG tablet Take 1 tablet (25 mg total) by mouth daily.   meclizine (ANTIVERT) 25 MG tablet Take 25 mg by mouth 3 (three) times daily as needed for dizziness.   Meth-Hyo-M Bl-Na Phos-Ph Sal (URO-MP) 118 MG CAPS Take 1 capsule by mouth 3 (three) times daily as needed.   [DISCONTINUED] HYDROcodone-acetaminophen (NORCO) 7.5-325 MG tablet Take 1 tablet by mouth every 8 (eight) hours. Fill on or after 10/03/2023   Bempedoic Acid (NEXLETOL) 180 MG TABS Take 1 tablet (180 mg total) by mouth daily. (Patient not taking: Reported on 10/31/2023)   Cholecalciferol (VITAMIN D3) 50 MCG (2000 UT) capsule Take 1 capsule (2,000 Units total) by mouth daily. (Patient not taking: Reported on 10/31/2023)   ezetimibe (ZETIA) 10 MG tablet Take 1 tablet (10 mg total) by mouth daily.   [DISCONTINUED] HYDROcodone-acetaminophen (NORCO) 7.5-325 MG tablet Take 1 tablet by mouth every 8 (eight) hours. (Patient not taking: Reported on 10/31/2023)   [DISCONTINUED] HYDROcodone-acetaminophen (NORCO) 7.5-325 MG tablet Take 1 tablet by mouth every 8 (eight) hours. Fill on  or after 09/05/2023 (Patient not taking: Reported on 10/31/2023)   No facility-administered encounter medications on file as of 10/31/2023.    Allergies (verified) Contrast media [iodinated contrast media], Lisinopril, Repatha [evolocumab], Allegra [fexofenadine], Codeine, Losartan, Nsaids, Prednisone, Pseudoephedrine hcl, Zithromax [azithromycin], Sulfa antibiotics, and Sulfur   History: Past Medical History:   Diagnosis Date   Arthritis    Bladder pain    Bradycardia    DDD (degenerative disc disease), lumbosacral    Grief 05/24/2018   Hyperlipidemia    Hyperplastic colon polyp    Hypertension    IC (interstitial cystitis)    Insomnia 04/06/2010   Qualifier: Diagnosis of  By: Dayton Martes MD, Talia     Internal hemorrhoids    Polyp of rectum    PONV (postoperative nausea and vomiting)    Right wrist tendonitis 08/24/2017   Squamous papilloma    in esophageal polyp   Uterine fibroid    Vertigo    Wears glasses    Wears partial dentures    Past Surgical History:  Procedure Laterality Date   CARPAL TUNNEL RELEASE Bilateral    COLONOSCOPY W/ POLYPECTOMY  07/27/2007   CYSTO WITH HYDRODISTENSION N/A 05/20/2014   Procedure: CYSTOSCOPY/HYDRODISTENSION, INSTILLATION OF CHLORPACTIN;  Surgeon: Valetta Fuller, MD;  Location: [facility];  Service: Urology;  Laterality: N/A;   CYSTO/  HYDRODISTENTION/  INSTILLATION CLORPACTIC  09/21/2010   DILATION AND CURETTAGE OF UTERUS  07/26/1986   KNEE ARTHROSCOPY Right 11/18/2009   TRIGGER FINGER RELEASE Right 08/23/2016   Procedure: RIGHT RING FINGER TRIGGER RELEASE;  Surgeon: Mack Hook, MD;  Location: [facility];  Service: Orthopedics;  Laterality: Right;   Family History  Problem Relation Age of Onset   Clotting disorder Mother 30   COPD Father    Heart attack Father 51   Emphysema Father    Hypertension Sister    Hypertension Brother    Heart disease Maternal Grandmother    Hypertension Maternal Grandmother    Heart disease Maternal Grandfather    Hypertension Maternal Grandfather    Heart disease Paternal Grandmother    Hypertension Paternal Grandmother    Heart disease Paternal Grandfather    Hypertension Paternal Grandfather    Stroke Paternal Aunt    Diabetes Son    Hypertension Son    Social History   Socioeconomic History   Marital status: Widowed    Spouse name: Not on file   Number of children: 6    Years of education: Not on file   Highest education level: Not on file  Occupational History   Occupation: housewife  Tobacco Use   Smoking status: Former    Current packs/day: 0.00    Types: Cigarettes    Quit date: 05/17/1971    Years since quitting: 52.4   Smokeless tobacco: Never  Vaping Use   Vaping status: Never Used  Substance and Sexual Activity   Alcohol use: No    Alcohol/week: 0.0 standard drinks of alcohol   Drug use: Yes    Types: Hydrocodone   Sexual activity: Never  Other Topics Concern   Not on file  Social History Narrative   Not on file   Social Drivers of Health   Financial Resource Strain: Low Risk  (10/31/2023)   Overall Financial Resource Strain (CARDIA)    Difficulty of Paying Living Expenses: Not hard at all  Food Insecurity: No Food Insecurity (10/31/2023)   Hunger Vital Sign    Worried About Programme researcher, broadcasting/film/video in  the Last Year: Never true    Ran Out of Food in the Last Year: Never true  Transportation Needs: No Transportation Needs (10/31/2023)   PRAPARE - Administrator, Civil Service (Medical): No    Lack of Transportation (Non-Medical): No  Physical Activity: Inactive (10/31/2023)   Exercise Vital Sign    Days of Exercise per Week: 0 days    Minutes of Exercise per Session: 0 min  Stress: No Stress Concern Present (10/31/2023)   [employer] of Occupational Health - Occupational Stress Questionnaire    Feeling of Stress : Not at all  Social Connections: Socially Isolated (10/31/2023)   Social Connection and Isolation Panel [NHANES]    Frequency of Communication with Friends and Family: More than three times a week    Frequency of Social Gatherings with Friends and Family: More than three times a week    Attends Religious Services: Never    Database administrator or Organizations: No    Attends Banker Meetings: Never    Marital Status: Widowed    Tobacco Counseling Counseling given: Not Answered    Clinical  Intake:  Pre-visit preparation completed: Yes  Pain : 0-10 Pain Score: 7  Pain Type: Chronic pain Pain Location: Back Pain Orientation: Lower Pain Radiating Towards: down legs Pain Descriptors / Indicators: Aching Pain Onset: More than a month ago Pain Frequency: Constant     Nutritional Risks: None Diabetes: No  Lab Results  Component Value Date   HGBA1C 5.8 04/15/2023   HGBA1C 5.8 12/09/2022   HGBA1C 5.8 07/30/2022     How often do you need to have someone help you when you read instructions, pamphlets, or other written materials from your doctor or pharmacy?: 1 - Never  Interpreter Needed?: No  Information entered by :: NAllen LPN   Activities of Daily Living     10/31/2023    3:02 PM  In your present state of health, do you have any difficulty performing the following activities:  Hearing? 0  Vision? 0  Difficulty concentrating or making decisions? 0  Walking or climbing stairs? 1  Comment back pain  Dressing or bathing? 0  Doing errands, shopping? 0  Preparing Food and eating ? N  Using the Toilet? N  In the past six months, have you accidently leaked urine? Y  Comment at night  Do you have problems with loss of bowel control? N  Managing your Medications? N  Managing your Finances? N  Housekeeping or managing your Housekeeping? N    Patient Care Team: [name], [name], NP as PCP - General (Internal Medicine) Wendall Stade, MD as PCP - Cardiology (Cardiology) Barron Alvine, MD (Inactive) as Consulting Physician (Urology) Freddy Finner, MD (Inactive) as Consulting Physician (Obstetrics and Gynecology) Davina Poke as Consulting Physician (Optometry) Janalyn Harder, MD (Inactive) as Consulting Physician (Dermatology)  Indicate any recent Medical Services you may have received from other than [facility] providers in the past year (date may be approximate).     Assessment:   This is a routine wellness examination for [facility].  Hearing/Vision  screen Hearing Screening - Comments:: Denies hearing issues Vision Screening - Comments:: Regular eye exams, Dr. London Sheer   Goals Addressed             This Visit's Progress    Patient Stated       10/31/2023, wants to get cholesterol down       Depression Screen  10/31/2023    3:13 PM 08/05/2023   10:21 AM 07/07/2023    1:57 PM 12/09/2022   10:41 AM 10/29/2022    3:01 PM 09/06/2022   10:10 AM 08/03/2022    1:38 PM  PHQ 2/9 Scores  PHQ - 2 Score 0 0 0 0 0 0 0  PHQ- 9 Score 3          Fall Risk     10/31/2023    3:12 PM 09/08/2023    2:13 PM 08/05/2023   10:21 AM 07/07/2023    1:57 PM 12/09/2022   10:41 AM  Fall Risk   Falls in the past year? 0 0 0 0 0  Number falls in past yr: 0  0  0  Injury with Fall? 0  0  0  Risk for fall due to : Medication side effect  No Fall Risks  No Fall Risks  Follow up Falls prevention discussed;Falls evaluation completed  Falls evaluation completed  Falls evaluation completed    MEDICARE RISK AT HOME:  Medicare Risk at Home Any stairs in or around the home?: Yes If so, are there any without handrails?: No Home free of loose throw rugs in walkways, pet beds, electrical cords, etc?: Yes Adequate lighting in your home to reduce risk of falls?: Yes Life alert?: No Use of a cane, walker or w/c?: No Grab bars in the bathroom?: No Shower chair or bench in shower?: No Elevated toilet seat or a handicapped toilet?: Yes  TIMED UP AND GO:  Was the test performed?  No  Cognitive Function: 6CIT completed    11/16/2017    1:21 PM 11/08/2016   11:05 AM 11/03/2015   12:33 PM  MMSE - Mini Mental State Exam  Orientation to time 5 5 5   Orientation to Place 5 5 5   Registration 3 3 3   Attention/ Calculation 5 0 0  Recall 3 3 3   Language- name 2 objects 2 0 0  Language- repeat 1 1 1   Language- follow 3 step command 3 3 3   Language- read & follow direction 1 0 0  Write a sentence 1 0 0  Copy design 1 0 0  Total score 30 20 20          10/31/2023    3:14 PM 10/29/2022    3:02 PM  6CIT Screen  What Year? 0 points 0 points  What month? 0 points 0 points  What time? 0 points 0 points  Count back from 20 0 points 0 points  Months in reverse 0 points 0 points  Repeat phrase 0 points 2 points  Total Score 0 points 2 points    Immunizations Immunization History  Administered Date(s) Administered   Influenza Split 05/29/2012   Influenza, High Dose Seasonal PF 05/24/2018, 05/29/2019   Influenza,inj,Quad PF,6+ Mos 06/23/2016, 05/27/2017   Influenza-Unspecified 06/05/2020, 06/05/2021   PFIZER(Purple Top)SARS-COV-2 Vaccination 10/28/2019, 12/08/2019   Pneumococcal Conjugate-13 11/03/2015   Pneumococcal Polysaccharide-23 07/14/2006, 05/29/2012   Td 07/11/2008   Td (Adult), 2 Lf Tetanus Toxid, Preservative Free 07/11/2008   Tdap 04/23/2014   Zoster, Live 10/27/2010    Screening Tests Health Maintenance  Topic Date Due   COVID-19 Vaccine (3 - Pfizer risk series) 01/05/2020   Zoster Vaccines- Shingrix (1 of 2) 11/03/2023 (Originally 01/25/1960)   INFLUENZA VACCINE  02/24/2024   DTaP/Tdap/Td (3 - Td or Tdap) 04/23/2024   Medicare Annual Wellness (AWV)  10/30/2024   Pneumonia Vaccine 57+ Years old  Completed  DEXA SCAN  Completed   HPV VACCINES  Aged Out   Colonoscopy  Discontinued   Hepatitis C Screening  Discontinued    Health Maintenance  Health Maintenance Due  Topic Date Due   COVID-19 Vaccine (3 - Pfizer risk series) 01/05/2020   Health Maintenance Items Addressed: Declines covid vaccine.  Additional Screening:  Vision Screening: Recommended annual ophthalmology exams for early detection of glaucoma and other disorders of the eye.  Dental Screening: Recommended annual dental exams for proper oral hygiene  Community Resource Referral / Chronic Care Management: CRR required this visit?  No   CCM required this visit?  No     Plan:     I have personally reviewed and noted the following in the  patient's chart:   Medical and social history Use of alcohol, tobacco or illicit drugs  Current medications and supplements including opioid prescriptions. Patient is not currently taking opioid prescriptions. Functional ability and status Nutritional status Physical activity Advanced directives List of other physicians Hospitalizations, surgeries, and ER visits in previous 12 months Vitals Screenings to include cognitive, depression, and falls Referrals and appointments  In addition, I have reviewed and discussed with patient certain preventive protocols, quality metrics, and best practice recommendations. A written personalized care plan for preventive services as well as general preventive health recommendations were provided to patient.     Barb Merino, LPN   07/29/863   After Visit Summary: (MyChart) Due to this being a telephonic visit, the after visit summary with patients personalized plan was offered to patient via MyChart   Notes: Nothing significant to report at this time.

## 2023-10-31 NOTE — Telephone Encounter (Signed)
 Prescription Request  10/31/2023  LOV: 08/05/2023  What is the name of the medication or equipment? Rx #: [id number]  HYDROcodone-acetaminophen (NORCO) 7.5-325 MG tablet [[id number]]   Have you contacted your pharmacy to request a refill? Yes, she has an appt with Claris Gower on 4/9 and would like to pick her script up before her appt.   Which pharmacy would you like this sent to?  [place] [facility] [address] [place] [address] Phone: [phone] Fax: [phone]    Patient notified that their request is being sent to the clinical staff for review and that they should receive a response within 2 business days.   Please advise at [facility] [phone]

## 2023-10-31 NOTE — Patient Instructions (Signed)
 Ms. Avants , Thank you for taking time to come for your Medicare Wellness Visit. I appreciate your ongoing commitment to your health goals. Please review the following plan we discussed and let me know if I can assist you in the future.   Referrals/Orders/Follow-Ups/Clinician Recommendations: none  This is a list of the screening recommended for you and due dates:  Health Maintenance  Topic Date Due   COVID-19 Vaccine (3 - Pfizer risk series) 01/05/2020   Zoster (Shingles) Vaccine (1 of 2) 11/03/2023*   Flu Shot  02/24/2024   DTaP/Tdap/Td vaccine (3 - Td or Tdap) 04/23/2024   Medicare Annual Wellness Visit  10/30/2024   Pneumonia Vaccine  Completed   DEXA scan (bone density measurement)  Completed   HPV Vaccine  Aged Out   Colon Cancer Screening  Discontinued   Hepatitis C Screening  Discontinued  *Topic was postponed. The date shown is not the original due date.    Advanced directives: (Declined) Advance directive discussed with you today. Even though you declined this today, please call our office should you change your mind, and we can give you the proper paperwork for you to fill out.  Next Medicare Annual Wellness Visit scheduled for next year: Yes  insert Preventive Care attachment Insert FALL PREVENTION attachment if needed

## 2023-10-31 NOTE — Telephone Encounter (Signed)
 Medication: Hydrocodone-acetaminophen (Norco) 7.5-325 mg  Directions: Take 1 tablet by mouth every 8 hours  Last given: 10/03/23 Number refills: 0 Last o/v: 08/05/23 Follow up: 3 months (aroound 11/03/23), scheduled 11/02/23 Labs: 08/08/23

## 2023-10-31 NOTE — Telephone Encounter (Signed)
 Called and made patient aware that her refill request for  Hydrocodone-acetaminophen (Norco) 7.5-325 mg was sent to her pharmacy. She thanked me for calling.

## 2023-11-01 ENCOUNTER — Other Ambulatory Visit (HOSPITAL_COMMUNITY): Payer: Self-pay

## 2023-11-02 ENCOUNTER — Ambulatory Visit (INDEPENDENT_AMBULATORY_CARE_PROVIDER_SITE_OTHER): Admitting: Nurse Practitioner

## 2023-11-02 ENCOUNTER — Other Ambulatory Visit (HOSPITAL_COMMUNITY): Payer: Self-pay

## 2023-11-02 ENCOUNTER — Encounter: Payer: Self-pay | Admitting: Nurse Practitioner

## 2023-11-02 DIAGNOSIS — F99 Mental disorder, not otherwise specified: Secondary | ICD-10-CM | POA: Diagnosis not present

## 2023-11-02 DIAGNOSIS — I10 Essential (primary) hypertension: Secondary | ICD-10-CM

## 2023-11-02 DIAGNOSIS — F1120 Opioid dependence, uncomplicated: Secondary | ICD-10-CM

## 2023-11-02 DIAGNOSIS — M51372 Other intervertebral disc degeneration, lumbosacral region with discogenic back pain and lower extremity pain: Secondary | ICD-10-CM

## 2023-11-02 DIAGNOSIS — R739 Hyperglycemia, unspecified: Secondary | ICD-10-CM

## 2023-11-02 DIAGNOSIS — H903 Sensorineural hearing loss, bilateral: Secondary | ICD-10-CM | POA: Diagnosis not present

## 2023-11-02 DIAGNOSIS — G8929 Other chronic pain: Secondary | ICD-10-CM | POA: Diagnosis not present

## 2023-11-02 DIAGNOSIS — Z79899 Other long term (current) drug therapy: Secondary | ICD-10-CM | POA: Diagnosis not present

## 2023-11-02 DIAGNOSIS — K5903 Drug induced constipation: Secondary | ICD-10-CM | POA: Diagnosis not present

## 2023-11-02 DIAGNOSIS — F5105 Insomnia due to other mental disorder: Secondary | ICD-10-CM

## 2023-11-02 DIAGNOSIS — M4807 Spinal stenosis, lumbosacral region: Secondary | ICD-10-CM

## 2023-11-02 LAB — COMPREHENSIVE METABOLIC PANEL WITH GFR
ALT: 18 U/L (ref 0–35)
AST: 24 U/L (ref 0–37)
Albumin: 4.3 g/dL (ref 3.5–5.2)
Alkaline Phosphatase: 51 U/L (ref 39–117)
BUN: 11 mg/dL (ref 6–23)
CO2: 27 meq/L (ref 19–32)
Calcium: 9 mg/dL (ref 8.4–10.5)
Chloride: 104 meq/L (ref 96–112)
Creatinine, Ser: 0.77 mg/dL (ref 0.40–1.20)
GFR: 72.68 mL/min (ref >60.00)
Glucose, Bld: 101 mg/dL — ABNORMAL HIGH (ref 70–99)
Potassium: 4.1 meq/L (ref 3.5–5.1)
Sodium: 138 meq/L (ref 135–145)
Total Bilirubin: 0.4 mg/dL (ref 0.2–1.2)
Total Protein: 6.8 g/dL (ref 6.0–8.3)

## 2023-11-02 LAB — HEMOGLOBIN A1C: Hgb A1c MFr Bld: 5.8 % (ref 4.6–6.5)

## 2023-11-02 MED ORDER — HYDROCODONE-ACETAMINOPHEN 7.5-325 MG PO TABS: 1.0000 | ORAL_TABLET | Freq: Three times a day (TID) | ORAL | 0 refills | Status: DC

## 2023-11-02 MED ORDER — TRAZODONE HCL 50 MG PO TABS
25.0000 mg | ORAL_TABLET | Freq: Every evening | ORAL | 2 refills | Status: AC | PRN
  Filled 2023-11-02: qty 30, 60d supply, fill #0
  Filled 2023-12-23: qty 30, 60d supply, fill #1

## 2023-11-02 MED ORDER — HYDROCODONE-ACETAMINOPHEN 7.5-325 MG PO TABS
1.0000 | ORAL_TABLET | Freq: Three times a day (TID) | ORAL | 0 refills | Status: DC
  Filled 2023-11-30 (×2): qty 90, 30d supply, fill #0

## 2023-11-02 NOTE — Assessment & Plan Note (Addendum)
 Secondary to nocturia, bladder pain and racing thoughts. Under the care of urology for interstitial cystitis She declined to use elavil. She agreed to use trazodone prn

## 2023-11-02 NOTE — Assessment & Plan Note (Signed)
 UTD with controlled substance contract and UDS Pain controlled with current dose of hydrocodone 7.5/325mg  1tab TID and gabapentin 200mg  at hs No red flag in PMP database: last filled 08/05/2023, 09/05/2023, and 10/03/2023 Denies daytime somnolence or confusion or fall Reports mild constipation- improved with use of colace 200mg  at HS She lives alone and independent with ADLs: able to drive No change in med Refilled sent for 10/31/2023, 11/30/2023, and 12/30/2023 F/up in 3months

## 2023-11-02 NOTE — Assessment & Plan Note (Signed)
 Repeat hgbA1c

## 2023-11-02 NOTE — Progress Notes (Signed)
 Established Patient Visit  Patient: Heather Shaw   DOB: 1940-12-02   83 y.o. Female  MRN: [id number] Visit Date: 11/02/2023  Subjective:    Chief Complaint  Patient presents with   Follow-up    3 month follow up    HPI Constipation Improved with colace  Hypertensive disorder Bp at goal with hydrochlorothiazide, coreg, hydralazine, and amlodipine Potassium also taken due to hypokalemia Denies any hypotension BP Readings from Last 3 Encounters:  11/02/23 124/76  10/25/23 134/68  10/13/23 132/74    Repeat CMP Maintain med doses F/up in 72month  Hearing loss Agreed to audiology referral  Chronic pain UTD with controlled substance contract and UDS Pain controlled with current dose of hydrocodone 7.5/325mg  1tab TID and gabapentin 200mg  at hs No red flag in PMP database: last filled 08/05/2023, 09/05/2023, and 10/03/2023 Denies daytime somnolence or confusion or fall Reports mild constipation- improved with use of colace 200mg  at HS She lives alone and independent with ADLs: able to drive No change in med Refilled sent for 10/31/2023, 11/30/2023, and 12/30/2023 F/up in 72months  Hyperglycemia Repeat hgbA1c  Obesity, morbid (HCC) Advised to maintain a mediterranean diet and daily exercise (water aerobics and chair yoga) Wt Readings from Last 3 Encounters:  11/02/23 198 lb 6.4 oz (90 kg)  10/25/23 198 lb 8 oz (90 kg)  10/13/23 200 lb (90.7 kg)     Insomnia Secondary to nocturia, bladder pain and racing thoughts. Under the care of urology for interstitial cystitis She declined to use elavil. She agreed to use trazodone prn  Reviewed medical, surgical, and social history today  Medications: Outpatient Medications Prior to Visit  Medication Sig   carvedilol (COREG) 6.25 MG tablet Take 1 tablet (6.25 mg total) by mouth 2 (two) times daily.   fluocinonide-emollient (LIDEX-E) 0.05 % cream Apply 1 Application topically 2 (two) times daily as needed.    gabapentin (NEURONTIN) 100 MG capsule Take 2 capsules (200 mg total) by mouth at bedtime.   hydrALAZINE (APRESOLINE) 25 MG tablet Take 1 tablet (25 mg total) by mouth 2 (two) times daily.   hydrochlorothiazide (HYDRODIURIL) 25 MG tablet Take 1 tablet (25 mg total) by mouth daily.   meclizine (ANTIVERT) 25 MG tablet Take 25 mg by mouth 3 (three) times daily as needed for dizziness.   Meth-Hyo-M Bl-Na Phos-Ph Sal (URO-MP) 118 MG CAPS Take 1 capsule by mouth 3 (three) times daily as needed.   [DISCONTINUED] HYDROcodone-acetaminophen (NORCO) 7.5-325 MG tablet Take 1 tablet by mouth every 8 (eight) hours.   amLODipine (NORVASC) 10 MG tablet Take 1 tablet (10 mg total) by mouth daily.   Bempedoic Acid (NEXLETOL) 180 MG TABS Take 1 tablet (180 mg total) by mouth daily. (Patient not taking: Reported on 10/25/2023)   Cholecalciferol (VITAMIN D3) 50 MCG (2000 UT) capsule Take 1 capsule (2,000 Units total) by mouth daily. (Patient not taking: Reported on 10/31/2023)   ezetimibe (ZETIA) 10 MG tablet Take 1 tablet (10 mg total) by mouth daily.   No facility-administered medications prior to visit.   Reviewed past medical and social history.   ROS per HPI above  Last lipids Lab Results  Component Value Date   CHOL 209 (H) 12/09/2022   HDL 42.70 12/09/2022   LDLCALC 130 (H) 12/09/2022   LDLDIRECT 128.0 07/30/2022   TRIG 179.0 (H) 12/09/2022   CHOLHDL 5 12/09/2022        Objective:  BP  124/76   Pulse (!) 56   Temp 97.9 F (36.6 C) (Temporal)   Ht 5\' 3"  (1.6 m)   Wt 198 lb 6.4 oz (90 kg)   SpO2 98%   BMI 35.14 kg/m      Physical Exam Vitals and nursing note reviewed.  Constitutional:      Appearance: She is obese.  Cardiovascular:     Rate and Rhythm: Normal rate and regular rhythm.     Pulses: Normal pulses.     Heart sounds: Normal heart sounds.  Pulmonary:     Effort: Pulmonary effort is normal.     Breath sounds: Normal breath sounds.  Musculoskeletal:     Right lower leg: No  edema.     Left lower leg: No edema.  Neurological:     Mental Status: She is alert and oriented to person, place, and time.     No results found for any visits on 11/02/23.    Assessment & Plan:    Problem List Items Addressed This Visit     Chronic pain   UTD with controlled substance contract and UDS Pain controlled with current dose of hydrocodone 7.5/325mg  1tab TID and gabapentin 200mg  at hs No red flag in PMP database: last filled 08/05/2023, 09/05/2023, and 10/03/2023 Denies daytime somnolence or confusion or fall Reports mild constipation- improved with use of colace 200mg  at HS She lives alone and independent with ADLs: able to drive No change in med Refilled sent for 10/31/2023, 11/30/2023, and 12/30/2023 F/up in 74months      Relevant Medications   traZODone (DESYREL) 50 MG tablet   HYDROcodone-acetaminophen (NORCO) 7.5-325 MG tablet (Start on 11/30/2023)   HYDROcodone-acetaminophen (NORCO) 7.5-325 MG tablet (Start on 12/30/2023)   Other Relevant Orders   Comprehensive metabolic panel with GFR   Constipation   Improved with colace      Controlled substance agreement signed   Relevant Medications   HYDROcodone-acetaminophen (NORCO) 7.5-325 MG tablet (Start on 11/30/2023)   HYDROcodone-acetaminophen (NORCO) 7.5-325 MG tablet (Start on 12/30/2023)   Degeneration of lumbosacral intervertebral disc   Relevant Medications   HYDROcodone-acetaminophen (NORCO) 7.5-325 MG tablet (Start on 11/30/2023)   HYDROcodone-acetaminophen (NORCO) 7.5-325 MG tablet (Start on 12/30/2023)   Hearing loss   Agreed to audiology referral      Relevant Orders   Ambulatory referral to Audiology   Hyperglycemia   Repeat hgbA1c      Relevant Orders   Comprehensive metabolic panel with GFR   Hemoglobin A1c   Hypertensive disorder   Bp at goal with hydrochlorothiazide, coreg, hydralazine, and amlodipine Potassium also taken due to hypokalemia Denies any hypotension BP Readings from Last 3  Encounters:  11/02/23 124/76  10/25/23 134/68  10/13/23 132/74    Repeat CMP Maintain med doses F/up in 74month      Relevant Orders   Comprehensive metabolic panel with GFR   Insomnia   Secondary to nocturia, bladder pain and racing thoughts. Under the care of urology for interstitial cystitis She declined to use elavil. She agreed to use trazodone prn      Relevant Medications   traZODone (DESYREL) 50 MG tablet   Obesity, morbid (HCC) - Primary   Advised to maintain a mediterranean diet and daily exercise (water aerobics and chair yoga) Wt Readings from Last 3 Encounters:  11/02/23 198 lb 6.4 oz (90 kg)  10/25/23 198 lb 8 oz (90 kg)  10/13/23 200 lb (90.7 kg)         Opioid type  dependence, continuous (HCC)   Relevant Medications   HYDROcodone-acetaminophen (NORCO) 7.5-325 MG tablet (Start on 11/30/2023)   HYDROcodone-acetaminophen (NORCO) 7.5-325 MG tablet (Start on 12/30/2023)   Other Visit Diagnoses       Spinal stenosis of lumbosacral region       Relevant Medications   HYDROcodone-acetaminophen (NORCO) 7.5-325 MG tablet (Start on 11/30/2023)   HYDROcodone-acetaminophen (NORCO) 7.5-325 MG tablet (Start on 12/30/2023)      Return in about 3 months (around 02/01/2024) for HTN, chronic pain management.     Alysia Penna, NP

## 2023-11-02 NOTE — Assessment & Plan Note (Signed)
 Advised to maintain a mediterranean diet and daily exercise (water aerobics and chair yoga) Wt Readings from Last 3 Encounters:  11/02/23 198 lb 6.4 oz (90 kg)  10/25/23 198 lb 8 oz (90 kg)  10/13/23 200 lb (90.7 kg)

## 2023-11-02 NOTE — Assessment & Plan Note (Signed)
 Bp at goal with hydrochlorothiazide, coreg, hydralazine, and amlodipine Potassium also taken due to hypokalemia Denies any hypotension BP Readings from Last 3 Encounters:  11/02/23 124/76  10/25/23 134/68  10/13/23 132/74    Repeat CMP Maintain med doses F/up in 86month

## 2023-11-02 NOTE — Assessment & Plan Note (Signed)
 Improved with colace

## 2023-11-02 NOTE — Assessment & Plan Note (Signed)
 Agreed to audiology referral

## 2023-11-02 NOTE — Patient Instructions (Signed)
 Go to lab Maintain Heart healthy diet and daily exercise. Maintain current medications.  How to Increase Your Level of Physical Activity Getting regular physical activity is important for your overall health and well-being. Most people do not get enough exercise. There are easy ways to increase your level of physical activity, even if you have not been very active in the past or if you are just starting out. What are the benefits of physical activity? Physical activity has many short-term and long-term benefits. Being active on a regular basis can improve your physical and mental health as well as provide other benefits. Physical health benefits Helping you lose weight or maintain a healthy weight. Strengthening your muscles and bones. Reducing your risk of certain long-term (chronic) diseases, including heart disease, cancer, and diabetes. Being able to move around more easily and for longer periods of time without getting tired (increased endurance or stamina). Improving your ability to fight off illness (enhanced immunity). Being able to sleep better. Helping you stay healthy as you get older, including: Helping you stay mobile, or capable of walking and moving around. Preventing accidents, such as falls. Increasing life expectancy. Mental health benefits Boosting your mood and improving your self-esteem. Lowering your chance of having mental health problems, such as depression or anxiety. Helping you feel good about your body. Other benefits Finding new sources of fun and enjoyment. Meeting new people who share a common interest. Before you begin If you have a chronic illness or have not been active for a while, check with your health care provider about how to get started. Ask your health care provider what activities are safe for you. Start out slowly. Walking or doing some simple chair exercises is a good place to start, especially if you have not been active before or for a long  time. Set goals that you can work toward. Ask your health care provider how much exercise is best for you. In general, most adults should: Do moderate-intensity exercise for at least 150 minutes each week (30 minutes on most days of the week) or vigorous exercise for at least 75 minutes each week, or a combination of these. Moderate-intensity exercise can include walking at a quick pace, biking, yoga, water aerobics, or gardening. Vigorous exercise involves activities that take more effort, such as jogging or running, playing sports, swimming laps, or jumping rope. Do strength exercises on at least 2 days each week. This can include weight lifting, body weight exercises, and resistance-band exercises. How to be more physically active Make a plan  Try to find activities that you enjoy. You are more likely to commit to an exercise routine if it does not feel like a chore. If you have bone or joint problems, choose low-impact exercises, like walking or swimming. Use these tips for being successful with an exercise plan: Find a workout partner for accountability. Join a group or class, such as an aerobics class, cycling class, or sports team. Make family time active. Go for a walk, bike, or swim. Include a variety of exercises each week. Consider using a fitness tracker, such as a mobile phone app or a device worn like a watch, that will count the number of steps you take each day. Many people strive to reach 10,000 steps a day. Find ways to be active in your daily routines Besides your formal exercise plans, you can find ways to do physical activity during your daily routines, such as: Walking or biking to work or to the store. Taking  the stairs instead of the elevator. Parking farther away from the door at work or at the store. Planning walking meetings. Walking around while you are on the phone. Where to find more information Centers for Disease Control and Prevention:  [employer]'s [employer] on Fitness, Sports & Nutrition: www.fitness.gov ChooseMyPlate: http://www.harvey.com/ Contact a health care provider if: You have headaches, muscle aches, or joint pain that is concerning. You feel dizzy or light-headed while exercising. You faint. You feel your heart skipping, racing, or fluttering. You have chest pain while exercising. Summary Exercise benefits your mind and body at any age, even if you are just starting out. If you have a chronic illness or have not been active for a while, check with your health care provider before increasing your physical activity. Choose activities that are safe and enjoyable for you. Ask your health care provider what activities are safe for you. Start slowly. Tell your health care provider if you have problems as you start to increase your activity level. This information is not intended to replace advice given to you by your health care provider. Make sure you discuss any questions you have with your health care provider. Document Revised: 11/07/2020 Document Reviewed: 11/07/2020 Elsevier Patient Education  2024 [employer].

## 2023-11-03 ENCOUNTER — Ambulatory Visit: Payer: Medicare Other | Admitting: Nurse Practitioner

## 2023-11-03 ENCOUNTER — Encounter: Payer: Self-pay | Admitting: Nurse Practitioner

## 2023-11-03 NOTE — Progress Notes (Signed)
 Addendum: Reviewed and agree with assessment and management plan. Asha Grumbine, Carie Caddy, MD

## 2023-11-07 ENCOUNTER — Encounter (HOSPITAL_BASED_OUTPATIENT_CLINIC_OR_DEPARTMENT_OTHER): Payer: Self-pay

## 2023-11-10 ENCOUNTER — Other Ambulatory Visit: Payer: Self-pay

## 2023-11-10 NOTE — Patient Instructions (Signed)
 Visit Information  Thank you for taking time to visit with me today. Please don't hesitate to contact me if I can be of assistance to you before our next scheduled appointment.  Our next appointment is by telephone on 11/25/23 at 2:00 Please call the care guide team at [phone] if you need to cancel or reschedule your appointment.   Following is a copy of your care plan:   Goals Addressed             This Visit's Progress    [facility] RN Care Plan       Problems:  Chronic Disease Management support and education needs related to HTN and CIC, Chronic Pain  Goal: Over the next 2 weeks the Patient will attend all scheduled medical appointments: 12/05/23 for colonoscopy, 02/01/24 PCP as evidenced by patient report and chart review        demonstrate Ongoing health management independence as evidenced by scheduling appointment for hearing evaluation, scheduling follow up appointment with Urology, adhering to a heart healthy, low fat, low salt diet.          Interventions:   Evaluation of current treatment plan related to HLD, HTN, and Chronic Pain ,  Chronic Disease  self-management and patient's adherence to plan as established by provider. Discussed plans with patient for ongoing care management follow up and provided patient with direct contact information for care management team Evaluation of current treatment plan related to HTN, HLD, and Chronic Pain and patient's adherence to plan as established by provider Advised patient to call and schedule appointments for hearing evaluation and follow up visit with Urology Provided education to patient re: heart healthy, low fat low salt diet. Reviewed medications with patient and discussed rationale for trazadone prescription. Reviewed scheduled/upcoming provider appointments including 12/05/23 colonoscopy, 02/01/24 PCP Discussed plans with patient for ongoing care management follow up and provided patient with direct contact information for care  management team  Patient Self-Care Activities:  Attend all scheduled provider appointments Call pharmacy for medication refills 3-7 days in advance of running out of medications Call provider office for new concerns or questions  Perform all self care activities independently  Perform IADL's (shopping, preparing meals, housekeeping, managing finances) independently Take medications as prescribed   check blood pressure 3 times per week keep a blood pressure log take blood pressure log to all doctor appointments call doctor for signs and symptoms of high blood pressure keep all doctor appointments take medications for blood pressure exactly as prescribed report new symptoms to your doctor eat more whole grains, fruits and vegetables, lean meats and healthy fats  Plan:  Face to Face appointment with care management team member scheduled for: 11/25/23 at 2:00             Please call the Suicide and Crisis Lifeline: 988 call the [place] National Suicide Prevention Lifeline: (972) 140-5160 or TTY: 423-566-6003 TTY 205-747-0092) to talk to a trained counselor call 1-800-273-TALK (toll free, 24 hour hotline) if you are experiencing a Mental Health or Behavioral Health Crisis or need someone to talk to.  Patient verbalizes understanding of instructions and care plan provided today and agrees to view in MyChart. Active MyChart status and patient understanding of how to access instructions and care plan via MyChart confirmed with patient.      Ruel Favors BSN RN CCM [place]  [facility], [facility] RN Care Manager Direct Dial: [phone] Fax: [phone]

## 2023-11-10 NOTE — Patient Outreach (Signed)
 Complex Care Management   Visit Note  11/10/2023  Name:  Heather Shaw MRN: [id number] DOB: [birth year]  Situation: Referral received for Complex Care Management related to  HTN and Chronic Pain  I obtained verbal consent from Patient.  Visit completed with patient  on the phone  Background:   Past Medical History:  Diagnosis Date   Angio-edema 03/27/2021   Formatting of this note might be different from the original.  Last Assessment & Plan:   Formatting of this note might be different from the original.  Resolved.  Likely due to IV contrast (lisinopril also d/c'd)     Arthritis    Bladder pain    Bradycardia    DDD (degenerative disc disease), lumbosacral    Grief 05/24/2018   Hyperlipidemia    Hyperplastic colon polyp    Hypertension    IC (interstitial cystitis)    Insomnia 04/06/2010   Qualifier: Diagnosis of  By: Kirke Pepper MD, Talia     Internal hemorrhoids    Polyp of rectum    PONV (postoperative nausea and vomiting)    Right wrist tendonitis 08/24/2017   Squamous papilloma    in esophageal polyp   Uterine fibroid    Vertigo    Wears glasses    Wears partial dentures     Assessment: Patient Reported Symptoms:  Cognitive Cognitive Status: Alert and oriented to person, place, and time      Neurological  No symptoms reported    HEENT HEENT Symptoms Reported: No symptoms reported      Cardiovascular    No symptoms reported  Respiratory Respiratory Symptoms Reported: No symptoms reported    Endocrine Is patient diabetic?: No    Gastrointestinal Gastrointestinal Symptoms Reported: Constipation Additional Gastrointestinal Details: takes stool softener for opioid induced constipation Gastrointestinal Conditions: Constipation Gastrointestinal Management Strategies: Medication therapy Nutrition Risk Screen (CP): No indicators present  Genitourinary   Genitourinary Management Strategies: Medication therapy Genitourinary Self-Management Outcome: 3 (uncertain)   Integumentary   Skin Conditions: Seborrheic dermatitis Skin Management Strategies: Coping strategies  Musculoskeletal Musculoskelatal Symptoms Reviewed: Muscle pain Musculoskeletal Conditions: Back pain (diagnosed with DJD and arthritis) Musculoskeletal Management Strategies: Adequate rest, Medication therapy, Routine screening Musculoskeletal Self-Management Outcome: 2 (bad) Falls in the past year?: No Number of falls in past year: 1 or less Patient at Risk for Falls Due to: Impaired mobility Fall risk Follow up: Falls evaluation completed  Psychosocial       Quality of Family Relationships: helpful, involved, supportive (her 4 children provide support and live nearby) Do you feel physically threatened by others?: No      11/02/2023    1:47 PM  Depression screen PHQ 2/9  Decreased Interest 0  Down, Depressed, Hopeless 0  PHQ - 2 Score 0    Vitals:   11/10/23 1314  BP: (!) 140/84    Medications Reviewed Today     Reviewed by Clarnce Crow, RN (Registered Nurse) on 11/10/23 at 1327  Med List Status: <None>   Medication Order Taking? Sig Documenting Provider Last Dose Status Informant  amLODipine (NORVASC) 10 MG tablet [id number] Yes Take 1 tablet (10 mg total) by mouth daily. [name], Connye Delaine, NP Taking Active   Bempedoic Acid (NEXLETOL) 180 MG TABS [id number] No Take 1 tablet (180 mg total) by mouth daily.  Patient not taking: Reported on 11/10/2023   Swinyer, Leilani Punter, NP Not Taking Active            Med Note Burley Carpenter, Serenity Batley   Thu Nov 10, 2023  1:21 PM) Cost prohibitive, and patient refused due to potential side effects  carvedilol (COREG) 6.25 MG tablet [id number] Yes Take 1 tablet (6.25 mg total) by mouth 2 (two) times daily. Hazle Lites, MD Taking Active   Cholecalciferol (VITAMIN D3) 50 MCG (2000 UT) capsule [id number] Yes Take 1 capsule (2,000 Units total) by mouth daily. [name], Connye Delaine, NP Taking Active   ezetimibe (ZETIA) 10 MG tablet [id number]  Take 1  tablet (10 mg total) by mouth daily. Hazle Lites, MD  Expired 10/25/23 2359   fluocinonide-emollient (LIDEX-E) 0.05 % cream [id number] Yes Apply 1 Application topically 2 (two) times daily as needed. [name], Connye Delaine, NP Taking Active   gabapentin (NEURONTIN) 100 MG capsule [id number] Yes Take 2 capsules (200 mg total) by mouth at bedtime. [name], Connye Delaine, NP Taking Active   hydrALAZINE (APRESOLINE) 25 MG tablet [id number] Yes Take 1 tablet (25 mg total) by mouth 2 (two) times daily. [name], Connye Delaine, NP Taking Active   hydrochlorothiazide (HYDRODIURIL) 25 MG tablet [id number] Yes Take 1 tablet (25 mg total) by mouth daily. Kandace Organ, NP Taking Active   HYDROcodone-acetaminophen (NORCO) 7.5-325 MG tablet [id number] Yes Take 1 tablet by mouth every 8 (eight) hours. Kandace Organ, NP Taking Active   HYDROcodone-acetaminophen (NORCO) 7.5-325 MG tablet [id number] Yes Take 1 tablet by mouth every 8 (eight) hours. [name], Connye Delaine, NP Taking Active   meclizine (ANTIVERT) 25 MG tablet [id number] Yes Take 25 mg by mouth 3 (three) times daily as needed for dizziness. [provider] Taking Active   Meth-Hyo-M Bl-Na Phos-Ph Sal (URO-MP) 118 MG CAPS [id number] Yes Take 1 capsule by mouth 3 (three) times daily as needed. [provider] Taking Active   traZODone (DESYREL) 50 MG tablet [id number] Yes Take 1/2 tablet (25 mg total) by mouth at bedtime as needed for sleep. [name], Connye Delaine, NP Taking Active             Recommendation:   PCP Follow-up Patient to call and schedule audiology evaluation, RNCM provided contact information.  Patient was encouraged to schedule follow up appointment with [facility]  Follow Up Plan:   Telephone follow up appointment date/time:  11/25/23 at 2:00   Clarnce Crow BSN RN CCM [place]  [facility], [facility] RN Care Manager Direct Dial: [phone] Fax: [phone]

## 2023-11-25 ENCOUNTER — Telehealth: Payer: Self-pay

## 2023-11-28 ENCOUNTER — Other Ambulatory Visit (HOSPITAL_COMMUNITY): Payer: Self-pay

## 2023-11-28 ENCOUNTER — Telehealth: Payer: Self-pay | Admitting: Pharmacy Technician

## 2023-11-28 NOTE — Telephone Encounter (Signed)
 Received cmm to do repatha  but pt has allergy to repatha . Archiving cmm request from cmm

## 2023-11-29 ENCOUNTER — Other Ambulatory Visit (HOSPITAL_COMMUNITY): Payer: Self-pay

## 2023-11-30 ENCOUNTER — Other Ambulatory Visit (HOSPITAL_COMMUNITY): Payer: Self-pay

## 2023-12-01 ENCOUNTER — Other Ambulatory Visit: Payer: Self-pay

## 2023-12-01 ENCOUNTER — Telehealth: Payer: Self-pay | Admitting: Internal Medicine

## 2023-12-01 NOTE — Patient Instructions (Signed)
 Visit Information  Thank you for taking time to visit with me today. Please don't hesitate to contact me if I can be of assistance to you before our next scheduled appointment.  Our next appointment is by telephone on 01/02/24 at 2:00 Please call the care guide team at [phone] if you need to cancel or reschedule your appointment.   Following is a copy of your care plan:   Goals Addressed             This Visit's Progress    VBCI RN Care Plan   On track    Problems:  Chronic Disease Management support and education needs related to HTN and CIC, Chronic Pain  Goal: Over the next 30 days the Patient will attend all scheduled medical appointments: 12/05/23 for colonoscopy, 02/01/24 PCP as evidenced by patient report and chart review        demonstrate Ongoing health management independence as evidenced by scheduling appointment for hearing evaluation, scheduling follow up appointment with Urology, adhering to a heart healthy, low fat, low salt diet.          Interventions:   Evaluation of current treatment plan related to HLD, HTN, and Chronic Pain, Chronic Disease self-management and patient's adherence to plan as established by provider. Discussed plans with patient for ongoing care management follow up and provided patient with direct contact information for care management team Evaluation of current treatment plan related to HTN, HLD, and Chronic Pain and patient's adherence to plan as established by provider Advised patient to call and schedule appointments for hearing evaluation and follow up visit with Urology Provided education to patient re: heart healthy, low fat low salt diet. Reviewed medications with patient and discussed rationale for trazadone prescription. Reviewed scheduled/upcoming provider appointments including 12/05/23 colonoscopy, 02/01/24 PCP Discussed plans with patient for ongoing care management follow up and provided patient with direct contact information for  care management team  Patient Self-Care Activities:  Attend all scheduled provider appointments Call pharmacy for medication refills 3-7 days in advance of running out of medications Call provider office for new concerns or questions  Perform all self care activities independently  Perform IADL's (shopping, preparing meals, housekeeping, managing finances) independently Take medications as prescribed   check blood pressure 3 times per week keep a blood pressure log take blood pressure log to all doctor appointments call doctor for signs and symptoms of high blood pressure keep all doctor appointments take medications for blood pressure exactly as prescribed report new symptoms to your doctor eat more whole grains, fruits and vegetables, lean meats and healthy fats  Plan:  Telephone followup appointment 01/02/24 at 2:00             Please call the Suicide and Crisis Lifeline: 988 call the [place]  National Suicide Prevention Lifeline: (703) 507-9576 or TTY: (972) 467-2919 TTY (912) 692-1498) to talk to a trained counselor call 1-800-273-TALK (toll free, 24 hour hotline) if you are experiencing a Mental Health or Behavioral Health Crisis or need someone to talk to.  Patient verbalizes understanding of instructions and care plan provided today and agrees to view in MyChart. Active MyChart status and patient understanding of how to access instructions and care plan via MyChart confirmed with patient.      Clarnce Crow BSN RN CCM [place]  [facility], [facility] RN Care Manager Direct Dial: [phone] Fax: [phone]

## 2023-12-01 NOTE — Patient Outreach (Signed)
 Complex Care Management   Visit Note  12/01/2023  Name:  Heather Shaw MRN: [id number] DOB: 20-Mar-1941  Situation: Referral received for Complex Care Management related to CIC, HTN I obtained verbal consent from Patient.  Visit completed with patient  on the phone  Background:   Past Medical History:  Diagnosis Date   Angio-edema 03/27/2021   Formatting of this note might be different from the original.  Last Assessment & Plan:   Formatting of this note might be different from the original.  Resolved.  Likely due to IV contrast (lisinopril  also d/c'd)     Arthritis    Bladder pain    Bradycardia    DDD (degenerative disc disease), lumbosacral    Grief 05/24/2018   Hyperlipidemia    Hyperplastic colon polyp    Hypertension    IC (interstitial cystitis)    Insomnia 04/06/2010   Qualifier: Diagnosis of  By: Kirke Pepper MD, Talia     Internal hemorrhoids    Polyp of rectum    PONV (postoperative nausea and vomiting)    Right wrist tendonitis 08/24/2017   Squamous papilloma    in esophageal polyp   Uterine fibroid    Vertigo    Wears glasses    Wears partial dentures     Assessment: Patient Reported Symptoms:  Cognitive Cognitive Status: Alert and oriented to person, place, and time      Neurological Neurological Review of Symptoms: Hearing changes (patient has not yet scheduled audiology exam)    HEENT HEENT Symptoms Reported: No symptoms reported      Cardiovascular Cardiovascular Symptoms Reported: No symptoms reported Does patient have uncontrolled Hypertension?: Yes Is patient checking Blood Pressure at home?: Yes Patient's Recent BP reading at home: 156/73  patient attributes to trouble sleeping, taking newly prescribed Trazadone.  Instructed patient that if BP does not improve, or if her back pain worsens, call provider to advise. Cardiovascular Conditions: Hypertension Cardiovascular Management Strategies: Medication therapy Cardiovascular Self-Management Outcome:  4 (good)  Respiratory Respiratory Symptoms Reported: No symptoms reported    Endocrine Patient reports the following symptoms related to hypoglycemia or hyperglycemia : No symptoms reported Is patient diabetic?: No    Gastrointestinal Additional Gastrointestinal Details: patient reports no changes      Genitourinary Other Genitourinary Symptoms: patient will call to schedule follow up with Urology.  to rule out UTI. Additional Genitourinary Details: RNCM provided on s/sx of UTI and when to seek medical treatment Genitourinary Conditions: Other Other Genitourinary Conditions: CIC Genitourinary Management Strategies: Diet modification, Coping strategies, Medication therapy, Fluid modification Genitourinary Self-Management Outcome: 3 (uncertain)  Integumentary Integumentary Symptoms Reported: No symptoms reported    Musculoskeletal Musculoskelatal Symptoms Reviewed: Muscle pain (patient reports no change in chronic pain symptoms, managing with opioids and rest) Musculoskeletal Conditions: Joint pain Musculoskeletal Management Strategies: Adequate rest, Medication therapy, Coping strategies, Exercise, Routine screening Musculoskeletal Self-Management Outcome: 3 (uncertain) Falls in the past year?: No    Psychosocial Psychosocial Symptoms Reported: No symptoms reported     Quality of Family Relationships: helpful, involved, supportive Do you feel physically threatened by others?: No      11/02/2023    1:47 PM  Depression screen PHQ 2/9  Decreased Interest 0  Down, Depressed, Hopeless 0  PHQ - 2 Score 0    Vitals:   12/01/23 1543  BP: (!) 156/65    Medications Reviewed Today     Reviewed by Clarnce Crow, RN (Registered Nurse) on 12/01/23 at 1547  Med List Status: <None>  Medication Order Taking? Sig Documenting Provider Last Dose Status Informant  amLODipine  (NORVASC ) 10 MG tablet [id number] Yes Take 1 tablet (10 mg total) by mouth daily. Kandace Organ, NP Taking Active    Bempedoic Acid  (NEXLETOL ) 180 MG TABS [id number] No Take 1 tablet (180 mg total) by mouth daily.  Patient not taking: Reported on 10/25/2023   Gerldine Koch, NP Not Taking Active            Med Note Burley Carpenter,  [place]   Thu Nov 10, 2023  1:21 PM) Cost prohibitive, and patient refused due to potential side effects  carvedilol  (COREG ) 6.25 MG tablet [id number] Yes Take 1 tablet (6.25 mg total) by mouth 2 (two) times daily. Hazle Lites, MD Taking Active   Cholecalciferol (VITAMIN D3) 50 MCG (2000 UT) capsule [id number] Yes Take 1 capsule (2,000 Units total) by mouth daily. [name], Connye Delaine, NP Taking Active   ezetimibe  (ZETIA ) 10 MG tablet [id number]  Take 1 tablet (10 mg total) by mouth daily. Hazle Lites, MD  Expired 10/25/23 2359   fluocinonide -emollient (LIDEX -E) 0.05 % cream [id number] Yes Apply 1 Application topically 2 (two) times daily as needed. Kandace Organ, NP Taking Active   gabapentin  (NEURONTIN ) 100 MG capsule [id number] Yes Take 2 capsules (200 mg total) by mouth at bedtime. [name], Connye Delaine, NP Taking Active   hydrALAZINE  (APRESOLINE ) 25 MG tablet [id number] Yes Take 1 tablet (25 mg total) by mouth 2 (two) times daily. Kandace Organ, NP Taking Active   hydrochlorothiazide  (HYDRODIURIL ) 25 MG tablet [id number] Yes Take 1 tablet (25 mg total) by mouth daily. [name], Connye Delaine, NP Taking Active   HYDROcodone -acetaminophen  (NORCO) 7.5-325 MG tablet [id number] Yes Take 1 tablet by mouth every 8 (eight) hours. [name], Connye Delaine, NP Taking Active   HYDROcodone -acetaminophen  (NORCO) 7.5-325 MG tablet [id number]  Take 1 tablet by mouth every 8 (eight) hours. [name], Connye Delaine, NP  Active   meclizine  (ANTIVERT ) 25 MG tablet [id number] Yes Take 25 mg by mouth 3 (three) times daily as needed for dizziness. [provider] Taking Active   Meth-Hyo-M Bl-Na Phos-Ph Sal (URO-MP) 118 MG CAPS [id number] Yes Take 1 capsule by mouth 3 (three) times daily as needed.  [provider] Taking Active   Na Sulfate-K Sulfate-Mg Sulfate concentrate (SUPREP) 17.5-3.13-1.6 GM/177ML SOLN [id number]  Take as directed Zehr, Jessica D, PA-C  Active   traZODone  (DESYREL ) 50 MG tablet [id number] Yes Take 1/2 tablet (25 mg total) by mouth at bedtime as needed for sleep. [name], Connye Delaine, NP Taking Active             Recommendation:   PCP Follow-up Schedule follow up visit with Urology  Schedule audiology evaluation  Follow Up Plan:   Telephone follow up appointment date/time:  01/02/24 at 2:00   Clarnce Crow BSN RN CCM [place]  [facility], [facility] RN Care Manager Direct Dial: [phone] Fax: [phone]

## 2023-12-01 NOTE — Telephone Encounter (Signed)
 Inbound call from patient, states she cannot drink the Gatorade due to her having IC, states she would like to know if can mix gatorade with water .

## 2023-12-02 NOTE — Telephone Encounter (Signed)
 Informed patient water  is fine to mix her miralax  with but she would need something to replace her electrolytes as this can dehydrate her. Patient voiced understanding.

## 2023-12-05 ENCOUNTER — Encounter: Payer: Self-pay | Admitting: Internal Medicine

## 2023-12-05 ENCOUNTER — Ambulatory Visit (AMBULATORY_SURGERY_CENTER): Admitting: Internal Medicine

## 2023-12-05 VITALS — BP 152/76 | HR 56 | Temp 97.0°F | Resp 14 | Ht 63.0 in | Wt 198.0 lb

## 2023-12-05 DIAGNOSIS — Z1211 Encounter for screening for malignant neoplasm of colon: Secondary | ICD-10-CM | POA: Diagnosis not present

## 2023-12-05 DIAGNOSIS — K648 Other hemorrhoids: Secondary | ICD-10-CM | POA: Diagnosis not present

## 2023-12-05 DIAGNOSIS — K5730 Diverticulosis of large intestine without perforation or abscess without bleeding: Secondary | ICD-10-CM

## 2023-12-05 DIAGNOSIS — D124 Benign neoplasm of descending colon: Secondary | ICD-10-CM

## 2023-12-05 DIAGNOSIS — D123 Benign neoplasm of transverse colon: Secondary | ICD-10-CM | POA: Diagnosis not present

## 2023-12-05 DIAGNOSIS — D122 Benign neoplasm of ascending colon: Secondary | ICD-10-CM | POA: Diagnosis not present

## 2023-12-05 DIAGNOSIS — D175 Benign lipomatous neoplasm of intra-abdominal organs: Secondary | ICD-10-CM | POA: Diagnosis not present

## 2023-12-05 DIAGNOSIS — E785 Hyperlipidemia, unspecified: Secondary | ICD-10-CM | POA: Diagnosis not present

## 2023-12-05 DIAGNOSIS — K635 Polyp of colon: Secondary | ICD-10-CM | POA: Diagnosis not present

## 2023-12-05 DIAGNOSIS — I10 Essential (primary) hypertension: Secondary | ICD-10-CM | POA: Diagnosis not present

## 2023-12-05 MED ORDER — SODIUM CHLORIDE 0.9 % IV SOLN: 500.0000 mL | Freq: Once | INTRAVENOUS | Status: DC

## 2023-12-05 NOTE — Op Note (Signed)
 [facility] Patient Name: Heather Shaw Procedure Date: 12/05/2023 3:31 PM MRN: [id number] Endoscopist: Nannette Babe , MD, [id number] Age: 83 Referring MD:  Date of Birth: 11/13/1940 Gender: Female Account #: [id number] Procedure:                Colonoscopy Indications:              Screening for colorectal malignant neoplasm, Last                            colonoscopy: April 2012 Medicines:                Monitored Anesthesia Care Procedure:                Pre-Anesthesia Assessment:                           - Prior to the procedure, a History and Physical                            was performed, and patient medications and                            allergies were reviewed. The patient's tolerance of                            previous anesthesia was also reviewed. The risks                            and benefits of the procedure and the sedation                            options and risks were discussed with the patient.                            All questions were answered, and informed consent                            was obtained. Prior Anticoagulants: The patient has                            taken no anticoagulant or antiplatelet agents. ASA                            Grade Assessment: II - A patient with mild systemic                            disease. After reviewing the risks and benefits,                            the patient was deemed in satisfactory condition to                            undergo the procedure.  After obtaining informed consent, the colonoscope                            was passed under direct vision. Throughout the                            procedure, the patient's blood pressure, pulse, and                            oxygen saturations were monitored continuously. The                            Olympus Scope [id number] was introduced through the                            anus and advanced to the cecum,  identified by                            appendiceal orifice and ileocecal valve. The                            colonoscopy was performed without difficulty. The                            patient tolerated the procedure well. The quality                            of the bowel preparation was good. The ileocecal                            valve, appendiceal orifice, and rectum were                            photographed. Scope In: 3:42:02 PM Scope Out: 4:14:54 PM Scope Withdrawal Time: 0 hours 30 minutes 34 seconds  Total Procedure Duration: 0 hours 32 minutes 52 seconds  Findings:                 The digital rectal exam was normal.                           There was a small lipoma, in the ascending colon.                           Four sessile polyps were found in the ascending                            colon. The polyps were 4 to 7 mm in size. These                            polyps were removed with a cold snare. Resection                            and retrieval were complete.  Four sessile polyps were found in the transverse                            colon. The polyps were 3 to 7 mm in size. These                            polyps were removed with a cold snare. Resection                            and retrieval were complete.                           Three sessile polyps were found in the descending                            colon. The polyps were 5 to 8 mm in size. These                            polyps were removed with a cold snare. Resection                            and retrieval were complete.                           Multiple medium-mouthed and small-mouthed                            diverticula were found in the sigmoid colon.                           Internal hemorrhoids were found during                            retroflexion. The hemorrhoids were small. Complications:            No immediate complications. Estimated Blood Loss:      Estimated blood loss was minimal. Impression:               - Small lipoma in the ascending colon.                           - Four 4 to 7 mm polyps in the ascending colon,                            removed with a cold snare. Resected and retrieved.                           - Four 3 to 7 mm polyps in the transverse colon,                            removed with a cold snare. Resected and retrieved.                           - Three 5  to 8 mm polyps in the descending colon,                            removed with a cold snare. Resected and retrieved.                           - Moderate diverticulosis in the sigmoid colon.                           - Small internal hemorrhoids. Recommendation:           - Patient has a contact number available for                            emergencies. The signs and symptoms of potential                            delayed complications were discussed with the                            patient. Return to normal activities tomorrow.                            Written discharge instructions were provided to the                            patient.                           - Resume previous diet.                           - Continue present medications.                           - Await pathology results.                           - No repeat colonoscopy due to age at next                            surveillance interval. Nannette Babe, MD 12/05/2023 4:18:17 PM This report has been signed electronically.

## 2023-12-05 NOTE — Patient Instructions (Signed)
THank you for letting us take care of your healthcare needs today. Please see handouts given to you on Polyps, Diverticulosis and Hemorrhoids.     YOU HAD AN ENDOSCOPIC PROCEDURE TODAY AT THE [facility]:   Refer to the procedure report that was given to you for any specific questions about what was found during the examination.  If the procedure report does not answer your questions, please call your gastroenterologist to clarify.  If you requested that your care partner not be given the details of your procedure findings, then the procedure report has been included in a sealed envelope for you to review at your convenience later.  YOU SHOULD EXPECT: Some feelings of bloating in the abdomen. Passage of more gas than usual.  Walking can help get rid of the air that was put into your GI tract during the procedure and reduce the bloating. If you had a lower endoscopy (such as a colonoscopy or flexible sigmoidoscopy) you may notice spotting of blood in your stool or on the toilet paper. If you underwent a bowel prep for your procedure, you may not have a normal bowel movement for a few days.  Please Note:  You might notice some irritation and congestion in your nose or some drainage.  This is from the oxygen used during your procedure.  There is no need for concern and it should clear up in a day or so.  SYMPTOMS TO REPORT IMMEDIATELY:  Following lower endoscopy (colonoscopy or flexible sigmoidoscopy):  Excessive amounts of blood in the stool  Significant tenderness or worsening of abdominal pains  Swelling of the abdomen that is new, acute  Fever of 100F or higher   For urgent or emergent issues, a gastroenterologist can be reached at any hour by calling [phone]. Do not use MyChart messaging for urgent concerns.    DIET:  We do recommend a small meal at first, but then you may proceed to your regular diet.  Drink plenty of fluids but you should avoid alcoholic beverages  for 24 hours.  ACTIVITY:  You should plan to take it easy for the rest of today and you should NOT DRIVE or use heavy machinery until tomorrow (because of the sedation medicines used during the test).    FOLLOW UP: Our staff will call the number listed on your records the next business day following your procedure.  We will call around 7:15- 8:00 am to check on you and address any questions or concerns that you may have regarding the information given to you following your procedure. If we do not reach you, we will leave a message.     If any biopsies were taken you will be contacted by phone or by letter within the next 1-3 weeks.  Please call us at [phone] if you have not heard about the biopsies in 3 weeks.    SIGNATURES/CONFIDENTIALITY: You and/or your care partner have signed paperwork which will be entered into your electronic medical record.  These signatures attest to the fact that that the information above on your After Visit Summary has been reviewed and is understood.  Full responsibility of the confidentiality of this discharge information lies with you and/or your care-partner.

## 2023-12-05 NOTE — Progress Notes (Signed)
 Pt's states no medical or surgical changes since previsit or office visit.

## 2023-12-05 NOTE — Progress Notes (Signed)
 GASTROENTEROLOGY PROCEDURE H&P NOTE   Primary Care Physician: Kandace Organ, NP    Reason for Procedure:  Colon cancer screening  Plan:    Colonoscopy  Patient is appropriate for endoscopic procedure(s) in the ambulatory ([facility]) setting.  The nature of the procedure, as well as the risks, benefits, and alternatives were carefully and thoroughly reviewed with the patient. Ample time for discussion and questions allowed. The patient understood, was satisfied, and agreed to proceed.     HPI: Heather Shaw is a 83 y.o. female who presents for colonoscopy.  Medical history as below.  Tolerated the prep.  No recent chest pain or shortness of breath.  No abdominal pain today.  Past Medical History:  Diagnosis Date   Angio-edema 03/27/2021   Formatting of this note might be different from the original.  Last Assessment & Plan:   Formatting of this note might be different from the original.  Resolved.  Likely due to IV contrast (lisinopril  also d/c'd)     Arthritis    Bladder pain    Bradycardia    DDD (degenerative disc disease), lumbosacral    Grief 05/24/2018   Hyperlipidemia    Hyperplastic colon polyp    Hypertension    IC (interstitial cystitis)    Insomnia 04/06/2010   Qualifier: Diagnosis of  By: Kirke Pepper MD, Talia     Internal hemorrhoids    Polyp of rectum    PONV (postoperative nausea and vomiting)    Right wrist tendonitis 08/24/2017   Squamous papilloma    in esophageal polyp   Uterine fibroid    Vertigo    Wears glasses    Wears partial dentures     Past Surgical History:  Procedure Laterality Date   CARPAL TUNNEL RELEASE Bilateral    COLONOSCOPY  11/02/2010   COLONOSCOPY W/ POLYPECTOMY  07/27/2007   CYSTO WITH HYDRODISTENSION N/A 05/20/2014   Procedure: CYSTOSCOPY/HYDRODISTENSION, INSTILLATION OF CHLORPACTIN;  Surgeon: Livingston Rigg, MD;  Location: [facility];  Service: Urology;  Laterality: N/A;   CYSTO/  HYDRODISTENTION/   INSTILLATION CLORPACTIC  09/21/2010   DILATION AND CURETTAGE OF UTERUS  07/26/1986   KNEE ARTHROSCOPY Right 11/18/2009   TRIGGER FINGER RELEASE Right 08/23/2016   Procedure: RIGHT RING FINGER TRIGGER RELEASE;  Surgeon: Rober Chimera, MD;  Location: [facility];  Service: Orthopedics;  Laterality: Right;   UPPER GASTROINTESTINAL ENDOSCOPY  10/17/2015    Prior to Admission medications   Medication Sig Start Date End Date Taking? Authorizing Provider  amLODipine  (NORVASC ) 10 MG tablet Take 1 tablet (10 mg total) by mouth daily. 04/15/23  Yes [name], Connye Delaine, NP  carvedilol  (COREG ) 6.25 MG tablet Take 1 tablet (6.25 mg total) by mouth 2 (two) times daily. 02/08/23  Yes Hilty, Aviva Lemmings, MD  Cholecalciferol (VITAMIN D3) 50 MCG (2000 UT) capsule Take 1 capsule (2,000 Units total) by mouth daily. 04/15/23  Yes [name], Connye Delaine, NP  ezetimibe  (ZETIA ) 10 MG tablet Take 1 tablet (10 mg total) by mouth daily. 02/08/23 12/05/23 Yes Hilty, Aviva Lemmings, MD  gabapentin  (NEURONTIN ) 100 MG capsule Take 2 capsules (200 mg total) by mouth at bedtime. 04/15/23  Yes [name], Connye Delaine, NP  hydrALAZINE  (APRESOLINE ) 25 MG tablet Take 1 tablet (25 mg total) by mouth 2 (two) times daily. 04/15/23  Yes [name], Connye Delaine, NP  hydrochlorothiazide  (HYDRODIURIL ) 25 MG tablet Take 1 tablet (25 mg total) by mouth daily. 08/05/23  Yes [name], Connye Delaine, NP  HYDROcodone -acetaminophen  (NORCO)  7.5-325 MG tablet Take 1 tablet by mouth every 8 (eight) hours. 11/30/23  Yes [name], Connye Delaine, NP  Bempedoic Acid  (NEXLETOL ) 180 MG TABS Take 1 tablet (180 mg total) by mouth daily. Patient not taking: Reported on 12/05/2023 10/13/23   Swinyer, Leilani Punter, NP  fluocinonide -emollient (LIDEX -E) 0.05 % cream Apply 1 Application topically 2 (two) times daily as needed. 03/29/23   [name], Connye Delaine, NP  meclizine  (ANTIVERT ) 25 MG tablet Take 25 mg by mouth 3 (three) times daily as needed for dizziness.    [provider]   Meth-Hyo-M Bl-Na Phos-Ph Sal (URO-MP) 118 MG CAPS Take 1 capsule by mouth 3 (three) times daily as needed. 05/13/23   [provider]  traZODone  (DESYREL ) 50 MG tablet Take 1/2 tablet (25 mg total) by mouth at bedtime as needed for sleep. 11/02/23   [name], Connye Delaine, NP    Current Outpatient Medications  Medication Sig Dispense Refill   amLODipine  (NORVASC ) 10 MG tablet Take 1 tablet (10 mg total) by mouth daily. 90 tablet 3   carvedilol  (COREG ) 6.25 MG tablet Take 1 tablet (6.25 mg total) by mouth 2 (two) times daily. 180 tablet 3   Cholecalciferol (VITAMIN D3) 50 MCG (2000 UT) capsule Take 1 capsule (2,000 Units total) by mouth daily.     ezetimibe  (ZETIA ) 10 MG tablet Take 1 tablet (10 mg total) by mouth daily. 90 tablet 3   gabapentin  (NEURONTIN ) 100 MG capsule Take 2 capsules (200 mg total) by mouth at bedtime. 90 capsule 3   hydrALAZINE  (APRESOLINE ) 25 MG tablet Take 1 tablet (25 mg total) by mouth 2 (two) times daily. 180 tablet 3   hydrochlorothiazide  (HYDRODIURIL ) 25 MG tablet Take 1 tablet (25 mg total) by mouth daily. 90 tablet 1   HYDROcodone -acetaminophen  (NORCO) 7.5-325 MG tablet Take 1 tablet by mouth every 8 (eight) hours. 90 tablet 0   Bempedoic Acid  (NEXLETOL ) 180 MG TABS Take 1 tablet (180 mg total) by mouth daily. (Patient not taking: Reported on 12/05/2023) 30 tablet 11   fluocinonide -emollient (LIDEX -E) 0.05 % cream Apply 1 Application topically 2 (two) times daily as needed. 30 g 0   meclizine  (ANTIVERT ) 25 MG tablet Take 25 mg by mouth 3 (three) times daily as needed for dizziness.     Meth-Hyo-M Bl-Na Phos-Ph Sal (URO-MP) 118 MG CAPS Take 1 capsule by mouth 3 (three) times daily as needed.     traZODone  (DESYREL ) 50 MG tablet Take 1/2 tablet (25 mg total) by mouth at bedtime as needed for sleep. 30 tablet 2   Current Facility-Administered Medications  Medication Dose Route Frequency Provider Last Rate Last Admin   0.9 %  sodium chloride  infusion  500 mL  Intravenous Once Rondi Ivy, Amber Bail, MD        Allergies as of 12/05/2023 - Review Complete 12/05/2023  Allergen Reaction Noted   Contrast media [iodinated contrast media] Other (See Comments) 12/30/2015   Lisinopril  Other (See Comments) 12/30/2015   Repatha  [evolocumab ] Other (See Comments) 10/13/2023   Allegra [fexofenadine] Other (See Comments) 03/08/2014   Codeine Nausea And Vomiting 07/16/2009   Losartan  Other (See Comments) 05/24/2022   Nsaids Other (See Comments) 11/16/2017   Prednisone  Other (See Comments) 10/17/2012   Pseudoephedrine hcl Nausea And Vomiting 12/21/2016   Sulfa antibiotics Rash 05/15/2014   Sulfur Rash 12/21/2016   Zithromax  [azithromycin ] Other (See Comments) 06/13/2015    Family History  Problem Relation Age of Onset   Clotting disorder Mother 37   COPD Father  Heart attack Father 15   Emphysema Father    Hypertension Sister    Hypertension Brother    Stroke Paternal Aunt    Heart disease Maternal Grandmother    Hypertension Maternal Grandmother    Heart disease Maternal Grandfather    Hypertension Maternal Grandfather    Heart disease Paternal Grandmother    Hypertension Paternal Grandmother    Heart disease Paternal Grandfather    Hypertension Paternal Grandfather    Cystic fibrosis Son    Diabetes Son    Colon polyps Son    Hypertension Son    Colon cancer Neg Hx    Esophageal cancer Neg Hx    Stomach cancer Neg Hx    Rectal cancer Neg Hx     Social History   Socioeconomic History   Marital status: Widowed    Spouse name: Not on file   Number of children: 6   Years of education: Not on file   Highest education level: Not on file  Occupational History   Occupation: housewife  Tobacco Use   Smoking status: Former    Current packs/day: 0.00    Types: Cigarettes    Quit date: 05/17/1971    Years since quitting: 52.5   Smokeless tobacco: Never  Vaping Use   Vaping status: Never Used  Substance and Sexual Activity   Alcohol use: No     Alcohol/week: 0.0 standard drinks of alcohol   Drug use: Yes    Types: Hydrocodone    Sexual activity: Not Currently    Birth control/protection: Post-menopausal  Other Topics Concern   Not on file  Social History Narrative   Not on file   Social Drivers of Health   Financial Resource Strain: Low Risk  (10/31/2023)   Overall Financial Resource Strain (CARDIA)    Difficulty of Paying Living Expenses: Not hard at all  Food Insecurity: No Food Insecurity (12/01/2023)   Hunger Vital Sign    Worried About Running Out of Food in the Last Year: Never true    Ran Out of Food in the Last Year: Never true  Transportation Needs: No Transportation Needs (12/01/2023)   PRAPARE - Administrator, Civil Service (Medical): No    Lack of Transportation (Non-Medical): No  Physical Activity: Inactive (10/31/2023)   Exercise Vital Sign    Days of Exercise per Week: 0 days    Minutes of Exercise per Session: 0 min  Stress: No Stress Concern Present (10/31/2023)   [employer] of Occupational Health - Occupational Stress Questionnaire    Feeling of Stress : Not at all  Social Connections: Socially Isolated (10/31/2023)   Social Connection and Isolation Panel [NHANES]    Frequency of Communication with Friends and Family: More than three times a week    Frequency of Social Gatherings with Friends and Family: More than three times a week    Attends Religious Services: Never    Database administrator or Organizations: No    Attends Banker Meetings: Never    Marital Status: Widowed  Intimate Partner Violence: Not At Risk (12/01/2023)   Humiliation, Afraid, Rape, and Kick questionnaire    Fear of Current or Ex-Partner: No    Emotionally Abused: No    Physically Abused: No    Sexually Abused: No    Physical Exam: Vital signs in last 24 hours: @BP  (!) 184/88   Pulse 66   Temp (!) 97 F (36.1 C) (Temporal)   Ht 5\' 3"  (1.6 m)  Wt 198 lb (89.8 kg)   SpO2 97%   BMI 35.07  kg/m  GEN: NAD EYE: Sclerae anicteric ENT: MMM CV: Non-tachycardic Pulm: CTA b/l GI: Soft, NT/ND NEURO:  Alert & Oriented x 3   Laurell Pond, MD [facility]  12/05/2023 3:23 PM

## 2023-12-05 NOTE — Progress Notes (Signed)
 Called to room to assist during endoscopic procedure.  Patient ID and intended procedure confirmed with present staff. Received instructions for my participation in the procedure from the performing physician.

## 2023-12-05 NOTE — Progress Notes (Signed)
 To pacu, VSS. Report to Rn.tb

## 2023-12-06 ENCOUNTER — Telehealth: Payer: Self-pay | Admitting: Lactation Services

## 2023-12-06 ENCOUNTER — Telehealth: Payer: Self-pay

## 2023-12-06 DIAGNOSIS — Z008 Encounter for other general examination: Secondary | ICD-10-CM | POA: Diagnosis not present

## 2023-12-06 DIAGNOSIS — I10 Essential (primary) hypertension: Secondary | ICD-10-CM | POA: Diagnosis not present

## 2023-12-06 DIAGNOSIS — Z6835 Body mass index (BMI) 35.0-35.9, adult: Secondary | ICD-10-CM | POA: Diagnosis not present

## 2023-12-06 DIAGNOSIS — M1990 Unspecified osteoarthritis, unspecified site: Secondary | ICD-10-CM | POA: Diagnosis not present

## 2023-12-06 DIAGNOSIS — F17211 Nicotine dependence, cigarettes, in remission: Secondary | ICD-10-CM | POA: Diagnosis not present

## 2023-12-06 DIAGNOSIS — G4700 Insomnia, unspecified: Secondary | ICD-10-CM | POA: Diagnosis not present

## 2023-12-06 DIAGNOSIS — E785 Hyperlipidemia, unspecified: Secondary | ICD-10-CM | POA: Diagnosis not present

## 2023-12-06 NOTE — Telephone Encounter (Signed)
-----   Message from Amber Bail Pyrtle sent at 12/05/2023  4:29 PM EDT ----- Pt would like banding appointments for symptomatic prolapsing internal hemorrhoids JMP

## 2023-12-06 NOTE — Telephone Encounter (Signed)
  Follow up Call-     12/05/2023    2:46 PM  Call back number  Post procedure Call Back phone  # [phone]  Permission to leave phone message Yes     Patient questions:  Do you have a fever, pain , or abdominal swelling? No. Pain Score  0 *  Have you tolerated food without any problems? Yes.    Have you been able to return to your normal activities? Yes.    Do you have any questions about your discharge instructions: Diet   No. Medications  No. Follow up visit  No.  Do you have questions or concerns about your Care? No.  Actions: * If pain score is 4 or above: No action needed, pain <4.

## 2023-12-06 NOTE — Telephone Encounter (Signed)
 Patient scheduled for first banding on 01/31/24 at 11:30 am. Patient verbalized understanding of appointment.

## 2023-12-08 LAB — SURGICAL PATHOLOGY

## 2023-12-12 ENCOUNTER — Ambulatory Visit: Payer: Self-pay | Admitting: Internal Medicine

## 2023-12-23 ENCOUNTER — Other Ambulatory Visit: Payer: Self-pay | Admitting: Nurse Practitioner

## 2023-12-23 ENCOUNTER — Other Ambulatory Visit (HOSPITAL_COMMUNITY): Payer: Self-pay

## 2023-12-23 DIAGNOSIS — M4807 Spinal stenosis, lumbosacral region: Secondary | ICD-10-CM

## 2023-12-23 DIAGNOSIS — M51372 Other intervertebral disc degeneration, lumbosacral region with discogenic back pain and lower extremity pain: Secondary | ICD-10-CM

## 2023-12-23 DIAGNOSIS — Z79899 Other long term (current) drug therapy: Secondary | ICD-10-CM

## 2023-12-23 DIAGNOSIS — F1120 Opioid dependence, uncomplicated: Secondary | ICD-10-CM

## 2023-12-23 DIAGNOSIS — G8929 Other chronic pain: Secondary | ICD-10-CM

## 2023-12-23 NOTE — Telephone Encounter (Signed)
 Last PDMP reviewed by Terie Fermo, RPH on 11/30/2023 at 7:31 AM   Medication: Hydrocodone -acetaminophen  7.5-325 Directions: Take 1 tablet by mouth every 8 hours  Last given: 11/30/23 Number refills: 0 Last o/v: 11/02/23 Follow up: 3 month-SCHEDULED 02/01/24 Labs:  11/02/23

## 2023-12-26 ENCOUNTER — Other Ambulatory Visit (HOSPITAL_COMMUNITY): Payer: Self-pay

## 2023-12-26 ENCOUNTER — Other Ambulatory Visit: Payer: Self-pay | Admitting: Nurse Practitioner

## 2023-12-26 DIAGNOSIS — F1120 Opioid dependence, uncomplicated: Secondary | ICD-10-CM

## 2023-12-26 DIAGNOSIS — Z79899 Other long term (current) drug therapy: Secondary | ICD-10-CM

## 2023-12-26 DIAGNOSIS — G8929 Other chronic pain: Secondary | ICD-10-CM

## 2023-12-26 DIAGNOSIS — M4807 Spinal stenosis, lumbosacral region: Secondary | ICD-10-CM

## 2023-12-26 DIAGNOSIS — M51372 Other intervertebral disc degeneration, lumbosacral region with discogenic back pain and lower extremity pain: Secondary | ICD-10-CM

## 2023-12-26 MED ORDER — HYDROCODONE-ACETAMINOPHEN 7.5-325 MG PO TABS
1.0000 | ORAL_TABLET | Freq: Three times a day (TID) | ORAL | 0 refills | Status: DC
  Filled 2023-12-30: qty 90, 30d supply, fill #0

## 2023-12-26 NOTE — Telephone Encounter (Unsigned)
 Copied from CRM [id number]. Topic: Clinical - Medication Refill >> Dec 26, 2023  2:01 PM Shalena F wrote: Medication: HYDROcodone -acetaminophen  (NORCO) 7.5-325 MG tablet  Has the patient contacted their pharmacy? Yes (Agent: If no, request that the patient contact the pharmacy for the refill. If patient does not wish to contact the pharmacy document the reason why and proceed with request.) (Agent: If yes, when and what did the pharmacy advise?)  This is the patient's preferred pharmacy:  [facility] [address], [address] [place] [address] Phone: [phone] Fax: [phone]  Is this the correct pharmacy for this prescription? Yes If no, delete pharmacy and type the correct one.   Has the prescription been filled recently? Yes  Is the patient out of the medication? No  Has the patient been seen for an appointment in the last year OR does the patient have an upcoming appointment? Yes  Can we respond through MyChart? No  Agent: Please be advised that Rx refills may take up to 3 business days. We ask that you follow-up with your pharmacy.

## 2023-12-27 ENCOUNTER — Other Ambulatory Visit (HOSPITAL_COMMUNITY): Payer: Self-pay

## 2023-12-27 DIAGNOSIS — Z6834 Body mass index (BMI) 34.0-34.9, adult: Secondary | ICD-10-CM | POA: Diagnosis not present

## 2023-12-27 DIAGNOSIS — R350 Frequency of micturition: Secondary | ICD-10-CM | POA: Diagnosis not present

## 2023-12-27 DIAGNOSIS — Z124 Encounter for screening for malignant neoplasm of cervix: Secondary | ICD-10-CM | POA: Diagnosis not present

## 2023-12-27 DIAGNOSIS — N952 Postmenopausal atrophic vaginitis: Secondary | ICD-10-CM | POA: Diagnosis not present

## 2023-12-27 DIAGNOSIS — Z1151 Encounter for screening for human papillomavirus (HPV): Secondary | ICD-10-CM | POA: Diagnosis not present

## 2023-12-27 MED ORDER — ESTRADIOL 0.1 MG/GM VA CREA
1.0000 | TOPICAL_CREAM | VAGINAL | 6 refills | Status: DC
  Filled 2023-12-27: qty 42.5, 90d supply, fill #0
  Filled 2024-02-22: qty 42.5, fill #0

## 2023-12-28 NOTE — Telephone Encounter (Signed)
 Medication: Hydrocodone -acetaminophen  7.5-325 mg Directions: Take 1 tablet by  mouth every 8 hours  Last given: 12/30/23 Number refills: 1 Last o/v: 11/02/23 Follow up: 3 months- scheduled 02/01/24 Labs: 11/02/23

## 2023-12-30 ENCOUNTER — Other Ambulatory Visit (HOSPITAL_COMMUNITY): Payer: Self-pay

## 2024-01-02 ENCOUNTER — Other Ambulatory Visit: Payer: Self-pay

## 2024-01-02 NOTE — Patient Instructions (Signed)
 Visit Information  Thank you for taking time to visit with me today. Please don't hesitate to contact me if I can be of assistance to you before our next scheduled appointment.  Our next appointment is by telephone on 01/16/24 at 1:00 Please call the care guide team at [phone] if you need to cancel or reschedule your appointment.   Following is a copy of your care plan:   Goals Addressed             This Visit's Progress    VBCI RN Care Plan       Problems:  Chronic Disease Management support and education needs related to HTN and CIC, Chronic Pain  Goal: Over the next 30 days the Patient will attend all scheduled medical appointments: 01/31/24 GI, 02/01/24 PCP as evidenced by chart review and patient report        continue to work with RN Care Manager and/or Social Worker to address care management and care coordination needs related to HTN and CIC and Chronic Pain as evidenced by adherence to care management team scheduled appointments     demonstrate Improved adherence to prescribed treatment plan for HTN and CIC and Chronic Pain as evidenced by taking all medications as directed, attending all doctor appointments, taking blood pressure and recording for review with RNCM demonstrate ongoing self health care management ability scheduling follow up appointment with Urology as evidenced by     take all medications exactly as prescribed and will call provider for medication related questions as evidenced by chart review and patient report     Interventions:   Hypertension Interventions: Last practice recorded BP readings:  BP Readings from Last 3 Encounters:  12/25/23 (!) 144/82  12/05/23 (!) 152/76  12/01/23 (!) 156/65   Most recent eGFR/CrCl: No results found for: "EGFR"  No components found for: "CRCL"  Evaluation of current treatment plan related to hypertension self management and patient's adherence to plan as established by provider Provided education to patient re:  stroke prevention, s/s of heart attack and stroke Reviewed medications with patient and discussed importance of compliance Discussed plans with patient for ongoing care management follow up and provided patient with direct contact information for care management team Advised patient, providing education and rationale, to monitor blood pressure daily and record, calling PCP for findings outside established parameters Reviewed scheduled/upcoming provider appointments including:  Provided education on prescribed diet heart healthy low salt Discussed complications of poorly controlled blood pressure such as heart disease, stroke, circulatory complications, vision complications, kidney impairment, sexual dysfunction Screening for signs and symptoms of depression related to chronic disease state  Assessed social determinant of health barriers  Patient Self-Care Activities:  Attend all scheduled provider appointments Attend church or other social activities Call pharmacy for medication refills 3-7 days in advance of running out of medications Call provider office for new concerns or questions  Perform all self care activities independently  Take medications as prescribed   check blood pressure daily choose a place to take my blood pressure (home, clinic or office, retail store) write blood pressure results in a log or diary learn about high blood pressure keep a blood pressure log take blood pressure log to all doctor appointments call doctor for signs and symptoms of high blood pressure keep all doctor appointments take medications for blood pressure exactly as prescribed report new symptoms to your doctor  Plan:  Telephone follow up appointment with care management team member scheduled for:  01/16/24  Please call the Suicide and Crisis Lifeline: 988 call the [place]  National Suicide Prevention Lifeline: 703-849-2896 or TTY: 727-291-0177 TTY 5168544540) to talk to a  trained counselor call 1-800-273-TALK (toll free, 24 hour hotline) if you are experiencing a Mental Health or Behavioral Health Crisis or need someone to talk to.  Patient verbalizes understanding of instructions and care plan provided today and agrees to view in MyChart. Active MyChart status and patient understanding of how to access instructions and care plan via MyChart confirmed with patient.      Clarnce Crow BSN RN CCM [place]  [facility], [facility] RN Care Manager Direct Dial: [phone] Fax: [phone]

## 2024-01-02 NOTE — Patient Outreach (Signed)
 Complex Care Management   Visit Note  01/02/2024  Name:  Heather Shaw MRN: [id number] DOB: [birth year]  Situation: Referral received for Complex Care Management related to HTN, CIC and Chronic Pain I obtained verbal consent from Patient.  Visit completed with patient  on the phone  Background:   Past Medical History:  Diagnosis Date   Angio-edema 03/27/2021   Formatting of this note might be different from the original.  Last Assessment & Plan:   Formatting of this note might be different from the original.  Resolved.  Likely due to IV contrast (lisinopril  also d/c'd)     Arthritis    Bladder pain    Bradycardia    DDD (degenerative disc disease), lumbosacral    Grief 05/24/2018   Hyperlipidemia    Hyperplastic colon polyp    Hypertension    IC (interstitial cystitis)    Insomnia 04/06/2010   Qualifier: Diagnosis of  By: Kirke Pepper MD, Talia     Internal hemorrhoids    Polyp of rectum    PONV (postoperative nausea and vomiting)    Right wrist tendonitis 08/24/2017   Squamous papilloma    in esophageal polyp   Uterine fibroid    Vertigo    Wears glasses    Wears partial dentures     Assessment: Patient Reported Symptoms:  Cognitive Cognitive Status: Alert and oriented to person, place, and time      Neurological Neurological Review of Symptoms: Hearing changes (has appt with [facility]) Neurological Management Strategies: Routine screening  HEENT HEENT Symptoms Reported: No symptoms reported      Cardiovascular Cardiovascular Symptoms Reported: No symptoms reported Does patient have uncontrolled Hypertension?: Yes Is patient checking Blood Pressure at home?: Yes Patient's Recent BP reading at home: patient to take BP daily for 2 weeks Cardiovascular Conditions: Hypertension Cardiovascular Management Strategies: Routine screening, Medication therapy Cardiovascular Self-Management Outcome: 3 (uncertain)  Respiratory Respiratory Symptoms Reported: No symptoms  reported    Endocrine Patient reports the following symptoms related to hypoglycemia or hyperglycemia : No symptoms reported    Gastrointestinal Gastrointestinal Symptoms Reported: No symptoms reported      Genitourinary Genitourinary Symptoms Reported: Other Other Genitourinary Symptoms: patient has not yet scheduled with Urology Additional Genitourinary Details: patient saw OBGYN last week, reports urine tested with no UTI Other Genitourinary Conditions: CIC  Integumentary Integumentary Symptoms Reported: No symptoms reported    Musculoskeletal Musculoskelatal Symptoms Reviewed: Muscle pain Additional Musculoskeletal Details: Chronic back pain managed with opioids Musculoskeletal Conditions: Mobility limited Musculoskeletal Management Strategies: Medication therapy Falls in the past year?: No    Psychosocial Psychosocial Symptoms Reported: No symptoms reported     Quality of Family Relationships: helpful, involved, supportive Do you feel physically threatened by others?: No      11/02/2023    1:47 PM  Depression screen PHQ 2/9  Decreased Interest 0  Down, Depressed, Hopeless 0  PHQ - 2 Score 0    Vitals:   12/25/23 1423  BP: (!) 144/82    Medications Reviewed Today     Reviewed by Clarnce Crow, RN (Registered Nurse) on 01/02/24 at 1420  Med List Status: <None>   Medication Order Taking? Sig Documenting Provider Last Dose Status Informant  0.9 %  sodium chloride  infusion [id number]   Pyrtle, Amber Bail, MD  Active   amLODipine  (NORVASC ) 10 MG tablet [id number] Yes Take 1 tablet (10 mg total) by mouth daily. Kandace Organ, NP Taking Active   Bempedoic Acid  (NEXLETOL ) 180 MG TABS [id number]  No Take 1 tablet (180 mg total) by mouth daily.  Patient not taking: Reported on 01/02/2024   Swinyer, Leilani Punter, NP Not Taking Active            Med Note Burley Carpenter, Sameen Leas   Thu Nov 10, 2023  1:21 PM) Cost prohibitive, and patient refused due to potential side effects  carvedilol   (COREG ) 6.25 MG tablet [id number] Yes Take 1 tablet (6.25 mg total) by mouth 2 (two) times daily. Hazle Lites, MD Taking Active   Cholecalciferol (VITAMIN D3) 50 MCG (2000 UT) capsule [id number] Yes Take 1 capsule (2,000 Units total) by mouth daily. Kandace Organ, NP Taking Active   estradiol  (ESTRACE ) 0.1 MG/GM vaginal cream [id number] No Place 1 Applicatorful vaginally 2-3 (two to three) times a week as directed.  Patient not taking: Reported on 01/02/2024    Not Taking Active            Med Note Burley Carpenter, Ailine Hefferan   Mon Jan 02, 2024  2:16 PM) Patient waiting to pick up at end of the month  ezetimibe  (ZETIA ) 10 MG tablet [id number]  Take 1 tablet (10 mg total) by mouth daily. Hazle Lites, MD  Expired 12/05/23 2359   fluocinonide -emollient (LIDEX -E) 0.05 % cream [id number] Yes Apply 1 Application topically 2 (two) times daily as needed. Kandace Organ, NP Taking Active   gabapentin  (NEURONTIN ) 100 MG capsule [id number] Yes Take 2 capsules (200 mg total) by mouth at bedtime. [name], Connye Delaine, NP Taking Active   hydrALAZINE  (APRESOLINE ) 25 MG tablet [id number] Yes Take 1 tablet (25 mg total) by mouth 2 (two) times daily. Kandace Organ, NP Taking Active   hydrochlorothiazide  (HYDRODIURIL ) 25 MG tablet [id number] Yes Take 1 tablet (25 mg total) by mouth daily. [name], Connye Delaine, NP Taking Active   HYDROcodone -acetaminophen  (NORCO) 7.5-325 MG tablet [id number] Yes Take 1 tablet by mouth every 8 (eight) hours. [name], Connye Delaine, NP Taking Active   meclizine  (ANTIVERT ) 25 MG tablet [id number] Yes Take 25 mg by mouth 3 (three) times daily as needed for dizziness. [provider] Taking Active   Meth-Hyo-M Bl-Na Phos-Ph Sal (URO-MP) 118 MG CAPS [id number] Yes Take 1 capsule by mouth 3 (three) times daily as needed. [provider] Taking Active   traZODone  (DESYREL ) 50 MG tablet [id number] Yes Take 1/2 tablet (25 mg total) by mouth at bedtime as needed for sleep. [name],  Connye Delaine, NP Taking Active             Recommendation:   Continue Current Plan of Care  Follow Up Plan:   Telephone follow up appointment date/time:  01/16/24   Clarnce Crow BSN RN CCM [place]  [facility], [facility] RN Care Manager Direct Dial: [phone] Fax: [phone]

## 2024-01-11 DIAGNOSIS — H903 Sensorineural hearing loss, bilateral: Secondary | ICD-10-CM | POA: Diagnosis not present

## 2024-01-16 ENCOUNTER — Other Ambulatory Visit: Payer: Self-pay

## 2024-01-16 NOTE — Patient Instructions (Signed)
 Visit Information  Thank you for taking time to visit with me today. Please don't hesitate to contact me if I can be of assistance to you before our next scheduled appointment.  Our next appointment is by telephone on 02/15/24 at 1:00 Please call the care guide team at [phone] if you need to cancel or reschedule your appointment.   Following is a copy of your care plan:   Goals Addressed             This Visit's Progress    VBCI RN Care Plan   Improving    Problems:  Chronic Disease Management support and education needs related to HTN and CIC, Chronic Pain  Goal: Over the next 30 days the Patient will attend all scheduled medical appointments: 01/31/24 GI, 02/01/24 PCP as evidenced by chart review and patient report        continue to work with RN Care Manager and/or Social Worker to address care management and care coordination needs related to HTN and CIC and Chronic Pain as evidenced by adherence to care management team scheduled appointments     demonstrate Improved adherence to prescribed treatment plan for HTN and CIC and Chronic Pain as evidenced by taking all medications as directed, attending all doctor appointments, taking blood pressure and recording for review with RNCM demonstrate ongoing self health care management ability scheduling follow up appointment with Urology as evidenced by     take all medications exactly as prescribed and will call provider for medication related questions as evidenced by chart review and patient report     Interventions:   Hypertension Interventions: Last practice recorded BP readings:  BP Readings from Last 3 Encounters:  01/16/24 (!) 149/73  12/25/23 (!) 144/82  12/05/23 (!) 152/76   Most recent eGFR/CrCl: No results found for: EGFR  No components found for: CRCL  Evaluation of current treatment plan related to hypertension self management and patient's adherence to plan as established by provider Provided education to patient  re: stroke prevention, s/s of heart attack and stroke Reviewed medications with patient and discussed importance of compliance Discussed plans with patient for ongoing care management follow up and provided patient with direct contact information for care management team Advised patient, providing education and rationale, to monitor blood pressure daily and record, calling PCP for findings outside established parameters Reviewed scheduled/upcoming provider appointments including:  Provided education on prescribed diet heart healthy low salt Discussed complications of poorly controlled blood pressure such as heart disease, stroke, circulatory complications, vision complications, kidney impairment, sexual dysfunction Screening for signs and symptoms of depression related to chronic disease state  Assessed social determinant of health barriers  Patient Self-Care Activities:  Attend all scheduled provider appointments Attend church or other social activities Call pharmacy for medication refills 3-7 days in advance of running out of medications Call provider office for new concerns or questions  Perform all self care activities independently  Take medications as prescribed   check blood pressure daily choose a place to take my blood pressure (home, clinic or office, retail store) write blood pressure results in a log or diary learn about high blood pressure keep a blood pressure log take blood pressure log to all doctor appointments call doctor for signs and symptoms of high blood pressure keep all doctor appointments take medications for blood pressure exactly as prescribed report new symptoms to your doctor  Plan:  Telephone follow up appointment with care management team member scheduled for:  02/15/24  Please call the Suicide and Crisis Lifeline: 988 call the [place]  National Suicide Prevention Lifeline: 9804886603 or TTY: 9841786450 TTY (740)038-6665) to talk to a  trained counselor call 1-800-273-TALK (toll free, 24 hour hotline) if you are experiencing a Mental Health or Behavioral Health Crisis or need someone to talk to.  Patient verbalizes understanding of instructions and care plan provided today and agrees to view in MyChart. Active MyChart status and patient understanding of how to access instructions and care plan via MyChart confirmed with patient.     Olam Idol BSN RN CCM [place]  [facility], [facility] RN Care Manager Direct Dial: [phone] Fax: [phone]

## 2024-01-16 NOTE — Patient Outreach (Signed)
 Complex Care Management   Visit Note  01/16/2024  Name:  Heather Shaw MRN: [id number] DOB: [birth year]  Situation: Referral received for Complex Care Management related to HTN and Chronic Pain I obtained verbal consent from Patient.  Visit completed with patient  on the phone  Background:   Past Medical History:  Diagnosis Date   Angio-edema 03/27/2021   Formatting of this note might be different from the original.  Last Assessment & Plan:   Formatting of this note might be different from the original.  Resolved.  Likely due to IV contrast (lisinopril  also d/c'd)     Arthritis    Bladder pain    Bradycardia    DDD (degenerative disc disease), lumbosacral    Grief 05/24/2018   Hyperlipidemia    Hyperplastic colon polyp    Hypertension    IC (interstitial cystitis)    Insomnia 04/06/2010   Qualifier: Diagnosis of  By: Jenetta MD, Talia     Internal hemorrhoids    Polyp of rectum    PONV (postoperative nausea and vomiting)    Right wrist tendonitis 08/24/2017   Squamous papilloma    in esophageal polyp   Uterine fibroid    Vertigo    Wears glasses    Wears partial dentures     Assessment: Patient Reported Symptoms:  Cognitive Cognitive Status: Alert and oriented to person, place, and time      Neurological Neurological Review of Symptoms: Hearing changes (completed audiology exam, will be getting hearing aids) Neurological Management Strategies: Routine screening, Medical device  HEENT HEENT Symptoms Reported: No symptoms reported      Cardiovascular Does patient have uncontrolled Hypertension?: Yes Is patient checking Blood Pressure at home?: Yes Patient's Recent BP reading at home: 149/73 Cardiovascular Conditions: Hypertension Cardiovascular Management Strategies: Routine screening, Medication therapy  Respiratory Respiratory Symptoms Reported: No symptoms reported    Endocrine Patient reports the following symptoms related to hypoglycemia or hyperglycemia : No  symptoms reported    Gastrointestinal Gastrointestinal Symptoms Reported: Other Other Gastrointestinal Symptoms: schedule hemorroid Gastrointestinal Conditions: Other    Genitourinary Other Genitourinary Symptoms: patient has not yet scheduled with Urology Genitourinary Conditions: Other Genitourinary Management Strategies: Medication therapy  Integumentary Integumentary Symptoms Reported: No symptoms reported    Musculoskeletal Musculoskelatal Symptoms Reviewed: Difficulty walking, Muscle pain Additional Musculoskeletal Details: bilateral leg pain, seeing PCP on 02/01/24 will discuss referral to Vascular MD Musculoskeletal Conditions: Back pain, Other Musculoskeletal Management Strategies: Medication therapy, Routine screening Falls in the past year?: No    Psychosocial Psychosocial Symptoms Reported: No symptoms reported            11/02/2023    1:47 PM  Depression screen PHQ 2/9  Decreased Interest 0  Down, Depressed, Hopeless 0  PHQ - 2 Score 0    Vitals:   01/16/24 1259  BP: (!) 149/73    Medications Reviewed Today     Reviewed by Heather Planas, RN (Registered Nurse) on 01/16/24 at 1259  Med List Status: <None>   Medication Order Taking? Sig Documenting Provider Last Dose Status Informant  0.9 %  sodium chloride  infusion [id number]   Pyrtle, Gordy HERO, MD  Active   amLODipine  (NORVASC ) 10 MG tablet [id number] Yes Take 1 tablet (10 mg total) by mouth daily. Katheen Roselie Rockford, NP  Active   Bempedoic Acid  (NEXLETOL ) 180 MG TABS [id number]  Take 1 tablet (180 mg total) by mouth daily.  Patient not taking: Reported on 01/16/2024   Percy Rosaline HERO, NP  Active  Med Note DANICE, Shareeka Yim   Thu Nov 10, 2023  1:21 PM) Cost prohibitive, and patient refused due to potential side effects  carvedilol  (COREG ) 6.25 MG tablet [id number] Yes Take 1 tablet (6.25 mg total) by mouth 2 (two) times daily. Mona Vinie BROCKS, MD  Active   Cholecalciferol (VITAMIN D3) 50 MCG (2000 UT)  capsule [id number] Yes Take 1 capsule (2,000 Units total) by mouth daily. Katheen Roselie Rockford, NP  Active   estradiol  (ESTRACE ) 0.1 MG/GM vaginal cream [id number] Yes Place 1 Applicatorful vaginally 2-3 (two to three) times a week as directed.   Active            Med Note DANICE, Kanyon Seibold   Mon Jan 02, 2024  2:16 PM) Patient waiting to pick up at end of the month  ezetimibe  (ZETIA ) 10 MG tablet [id number] Yes Take 1 tablet (10 mg total) by mouth daily. Mona Vinie BROCKS, MD  Active   fluocinonide -emollient (LIDEX -E) 0.05 % cream [id number] Yes Apply 1 Application topically 2 (two) times daily as needed. [name], Roselie Rockford, NP  Active   gabapentin  (NEURONTIN ) 100 MG capsule [id number] Yes Take 2 capsules (200 mg total) by mouth at bedtime. [name], Roselie Rockford, NP  Active   hydrALAZINE  (APRESOLINE ) 25 MG tablet [id number] Yes Take 1 tablet (25 mg total) by mouth 2 (two) times daily. [name], Roselie Rockford, NP  Active   hydrochlorothiazide  (HYDRODIURIL ) 25 MG tablet [id number] Yes Take 1 tablet (25 mg total) by mouth daily. [name], Roselie Rockford, NP  Active   HYDROcodone -acetaminophen  (NORCO) 7.5-325 MG tablet [id number] Yes Take 1 tablet by mouth every 8 (eight) hours. [name], Roselie Rockford, NP  Active   meclizine  (ANTIVERT ) 25 MG tablet [id number] Yes Take 25 mg by mouth 3 (three) times daily as needed for dizziness. [provider]  Active   Meth-Hyo-M Bl-Na Phos-Ph Sal (URO-MP) 118 MG CAPS [id number] Yes Take 1 capsule by mouth 3 (three) times daily as needed. [provider]  Active   traZODone  (DESYREL ) 50 MG tablet [id number] Yes Take 1/2 tablet (25 mg total) by mouth at bedtime as needed for sleep. [name], Roselie Rockford, NP  Active             Recommendation:   PCP Follow-up to discuss increasing symptoms of bilateral leg pain.  Follow Up Plan:   Telephone follow up appointment date/time:  02/15/24 at 1:00   Olam Idol BSN RN CCM [place]  [place], [facility]  Health RN Care Manager Direct Dial: [phone] Fax: [phone]

## 2024-01-25 ENCOUNTER — Other Ambulatory Visit: Payer: Self-pay | Admitting: Nurse Practitioner

## 2024-01-25 DIAGNOSIS — Z79899 Other long term (current) drug therapy: Secondary | ICD-10-CM

## 2024-01-25 DIAGNOSIS — G8929 Other chronic pain: Secondary | ICD-10-CM

## 2024-01-25 DIAGNOSIS — M51372 Other intervertebral disc degeneration, lumbosacral region with discogenic back pain and lower extremity pain: Secondary | ICD-10-CM

## 2024-01-25 DIAGNOSIS — F1120 Opioid dependence, uncomplicated: Secondary | ICD-10-CM

## 2024-01-25 DIAGNOSIS — M4807 Spinal stenosis, lumbosacral region: Secondary | ICD-10-CM

## 2024-01-25 NOTE — Telephone Encounter (Unsigned)
 Copied from CRM [id number]. Topic: Clinical - Medication Refill >> Jan 25, 2024 12:41 PM Suzen RAMAN wrote: Medication: HYDROcodone -acetaminophen  (NORCO) 7.5-325 MG tablet  Has the patient contacted their pharmacy? Yes  This is the patient's preferred pharmacy:   - [facility] [address], [address] [place] [address] Phone: [phone] Fax: [phone]  Is this the correct pharmacy for this prescription? Yes If no, delete pharmacy and type the correct one.   Has the prescription been filled recently? Yes  Is the patient out of the medication? Will take last pill on Saturday and pharmacy will be closed for the holiday and the weekend.   Has the patient been seen for an appointment in the last year OR does the patient have an upcoming appointment? Yes  Can we respond through MyChart? Yes  Agent: Please be advised that Rx refills may take up to 3 business days. We ask that you follow-up with your pharmacy.

## 2024-01-26 ENCOUNTER — Other Ambulatory Visit: Payer: Self-pay | Admitting: Nurse Practitioner

## 2024-01-26 ENCOUNTER — Other Ambulatory Visit (HOSPITAL_COMMUNITY): Payer: Self-pay

## 2024-01-26 DIAGNOSIS — Z79899 Other long term (current) drug therapy: Secondary | ICD-10-CM

## 2024-01-26 DIAGNOSIS — M51372 Other intervertebral disc degeneration, lumbosacral region with discogenic back pain and lower extremity pain: Secondary | ICD-10-CM

## 2024-01-26 DIAGNOSIS — G8929 Other chronic pain: Secondary | ICD-10-CM

## 2024-01-26 DIAGNOSIS — F1120 Opioid dependence, uncomplicated: Secondary | ICD-10-CM

## 2024-01-26 DIAGNOSIS — M4807 Spinal stenosis, lumbosacral region: Secondary | ICD-10-CM

## 2024-01-26 MED ORDER — HYDROCODONE-ACETAMINOPHEN 7.5-325 MG PO TABS
1.0000 | ORAL_TABLET | Freq: Three times a day (TID) | ORAL | 0 refills | Status: DC
  Filled 2024-01-26: qty 90, 30d supply, fill #0

## 2024-01-26 NOTE — Telephone Encounter (Signed)
 Refill request sent to provider to review in encounter dated for 01/26/24

## 2024-01-26 NOTE — Telephone Encounter (Signed)
 Medication: Hydrocodone -Acetaminophen  (Norco) 7.5-325 mg  Directions: Take 1 tablet by mouth every 8 hours as needed  Last given: 12/30/23 Number refills: 0  Last o/v: 11/02/23 Follow up: 3 months-SCHEDULED 02/01/24 Labs: 11/02/23  PDMP last viewed by Feliciano Laymon LABOR, RPH on 12/30/2023 at 7:43 AM

## 2024-01-31 ENCOUNTER — Telehealth: Payer: Self-pay | Admitting: Internal Medicine

## 2024-01-31 ENCOUNTER — Encounter: Admitting: Internal Medicine

## 2024-01-31 DIAGNOSIS — E785 Hyperlipidemia, unspecified: Secondary | ICD-10-CM | POA: Diagnosis not present

## 2024-01-31 DIAGNOSIS — K644 Residual hemorrhoidal skin tags: Secondary | ICD-10-CM | POA: Diagnosis not present

## 2024-01-31 DIAGNOSIS — N3010 Interstitial cystitis (chronic) without hematuria: Secondary | ICD-10-CM | POA: Diagnosis not present

## 2024-01-31 DIAGNOSIS — K5900 Constipation, unspecified: Secondary | ICD-10-CM | POA: Diagnosis not present

## 2024-01-31 DIAGNOSIS — Z79899 Other long term (current) drug therapy: Secondary | ICD-10-CM | POA: Diagnosis not present

## 2024-01-31 DIAGNOSIS — R339 Retention of urine, unspecified: Secondary | ICD-10-CM | POA: Diagnosis not present

## 2024-01-31 DIAGNOSIS — I10 Essential (primary) hypertension: Secondary | ICD-10-CM | POA: Diagnosis not present

## 2024-02-01 ENCOUNTER — Ambulatory Visit: Admitting: Nurse Practitioner

## 2024-02-14 ENCOUNTER — Telehealth: Payer: Self-pay

## 2024-02-15 ENCOUNTER — Telehealth: Payer: Self-pay

## 2024-02-22 ENCOUNTER — Other Ambulatory Visit (HOSPITAL_COMMUNITY): Payer: Self-pay

## 2024-02-22 ENCOUNTER — Other Ambulatory Visit: Payer: Self-pay

## 2024-02-22 ENCOUNTER — Other Ambulatory Visit (HOSPITAL_BASED_OUTPATIENT_CLINIC_OR_DEPARTMENT_OTHER): Payer: Self-pay | Admitting: Internal Medicine

## 2024-02-22 ENCOUNTER — Other Ambulatory Visit: Payer: Self-pay | Admitting: Nurse Practitioner

## 2024-02-22 DIAGNOSIS — M51372 Other intervertebral disc degeneration, lumbosacral region with discogenic back pain and lower extremity pain: Secondary | ICD-10-CM

## 2024-02-22 DIAGNOSIS — F1120 Opioid dependence, uncomplicated: Secondary | ICD-10-CM

## 2024-02-22 DIAGNOSIS — M4807 Spinal stenosis, lumbosacral region: Secondary | ICD-10-CM

## 2024-02-22 DIAGNOSIS — M51379 Other intervertebral disc degeneration, lumbosacral region without mention of lumbar back pain or lower extremity pain: Secondary | ICD-10-CM

## 2024-02-22 DIAGNOSIS — Z79899 Other long term (current) drug therapy: Secondary | ICD-10-CM

## 2024-02-22 DIAGNOSIS — G8929 Other chronic pain: Secondary | ICD-10-CM

## 2024-02-22 DIAGNOSIS — M5416 Radiculopathy, lumbar region: Secondary | ICD-10-CM

## 2024-02-22 MED ORDER — GABAPENTIN 100 MG PO CAPS
200.0000 mg | ORAL_CAPSULE | Freq: Every day | ORAL | 0 refills | Status: DC
  Filled 2024-02-22: qty 180, 90d supply, fill #0

## 2024-02-22 NOTE — Telephone Encounter (Signed)
 Medication: Hydrocodone -Acetaminophen  (Norco) 7.5-325 mg Directions: Take 1 tablet by mouth every 8 hours  Last given: 01/26/24 Number refills: 0 Last o/v: 11/02/23 Follow up: 3 months-03/14/24 Hosp F/u Labs:

## 2024-02-23 ENCOUNTER — Other Ambulatory Visit: Payer: Self-pay | Admitting: *Deleted

## 2024-02-23 ENCOUNTER — Other Ambulatory Visit (HOSPITAL_COMMUNITY): Payer: Self-pay

## 2024-02-23 ENCOUNTER — Encounter: Payer: Self-pay | Admitting: *Deleted

## 2024-02-23 ENCOUNTER — Other Ambulatory Visit: Payer: Self-pay | Admitting: Nurse Practitioner

## 2024-02-23 DIAGNOSIS — F1120 Opioid dependence, uncomplicated: Secondary | ICD-10-CM

## 2024-02-23 DIAGNOSIS — G8929 Other chronic pain: Secondary | ICD-10-CM

## 2024-02-23 DIAGNOSIS — M51372 Other intervertebral disc degeneration, lumbosacral region with discogenic back pain and lower extremity pain: Secondary | ICD-10-CM

## 2024-02-23 DIAGNOSIS — M4807 Spinal stenosis, lumbosacral region: Secondary | ICD-10-CM

## 2024-02-23 DIAGNOSIS — Z79899 Other long term (current) drug therapy: Secondary | ICD-10-CM

## 2024-02-23 MED ORDER — HYDROCODONE-ACETAMINOPHEN 7.5-325 MG PO TABS
1.0000 | ORAL_TABLET | Freq: Three times a day (TID) | ORAL | 0 refills | Status: DC
  Filled 2024-02-29: qty 90, 30d supply, fill #0
  Filled ????-??-??: fill #0

## 2024-02-23 NOTE — Telephone Encounter (Signed)
 Refll request for  Hydrocodone /Acet  7.5/325 mg  LR 01/26/24, #90, 0 rf LOV  11/02/23 03/14/24  Please review and advise.  Thanks. Dm/cma

## 2024-02-23 NOTE — Patient Outreach (Signed)
 Complex Care Management   Visit Note  05/03/2024 updated for 02/23/24   Name:  Heather Shaw MRN: [id number] DOB: 1940-10-02  Situation: Referral received for Complex Care Management related to RN CCM pharmacy for medication adherence & management + hypertension + chronic pain I obtained verbal consent from Patient.  Visit completed with patient  on the phone  Primary Diagnosis: Interstitial Cystitis (IC)  Previously treated at [facility] since her 74's Interested in transitioning care to [facility] Currently on Miralax : 1 cap every other day (previously used Senokot) Reports bladder pressure affecting intestines Pelvic floor exercises cause discomfort    Hydration & Nutrition:  Fluid intake: 8-16 oz daily, includes Liquid IV and pear juice Not currently followed by a nutritionist Reports unintentional weight loss of ~20 lbs    Musculoskeletal & Pain Management:  Degenerative joint issues Left shoulder pain; uses ice, heat, and stretching for relief Pain level: 5/10, worsens with stretching No current pain management provider; only sees PCP Experiences increased pain with cold exposure Difficulty walking due to knee limitations    Gastrointestinal History:  Recent ED visit on 01/31/24 for constipation and hemorrhoids Follow-up with Dr. Gordy Starch for hemorrhoid banding Colonoscopy revealed 2 polyps    Gynecological History:  History of ovarian cysts (Dr. German, OB/GYN) Previously seen by Dr. Rosalynn (OB/GYN) Multiple abdominal diagnoses discussed as potentially interrelated History of 6 pregnancies    Personal History:  Widowed (husband passed in May 2019) Has a daughter (age not specified, possibly adult) Background:   Past Medical History:  Diagnosis Date   Angio-edema 03/27/2021   Formatting of this note might be different from the original.  Last Assessment & Plan:   Formatting of this note might be different from the original.   Resolved.  Likely due to IV contrast (lisinopril  also d/c'd)     Arthritis    Bladder pain    Bradycardia    DDD (degenerative disc disease), lumbosacral    Grief 05/24/2018   Hyperlipidemia    Hyperplastic colon polyp    Hypertension    IC (interstitial cystitis)    Insomnia 04/06/2010   Qualifier: Diagnosis of  By: Jenetta MD, Talia     Internal hemorrhoids    Polyp of rectum    PONV (postoperative nausea and vomiting)    Right wrist tendonitis 08/24/2017   Squamous papilloma    in esophageal polyp   Uterine fibroid    Vertigo    Wears glasses    Wears partial dentures     Assessment: Patient Reported Symptoms:  Cognitive Cognitive Status: Alert and oriented to person, place, and time, Insightful and able to interpret abstract concepts, Normal speech and language skills Cognitive/Intellectual Conditions Management [RPT]: None reported or documented in medical history or problem list   Health Maintenance Behaviors: Healthy diet, Annual physical exam Healing Pattern: Average Health Facilitated by: Pain control, Rest  Neurological      HEENT HEENT Symptoms Reported: No symptoms reported HEENT Self-Management Outcome: 4 (good)    Cardiovascular Does patient have uncontrolled Hypertension?: Yes Is patient checking Blood Pressure at home?: Yes Patient's Recent BP reading at home: 142/84 Cardiovascular Management Strategies: Routine screening, Diet modification Cardiovascular Self-Management Outcome: 3 (uncertain)  Respiratory Respiratory Symptoms Reported: No symptoms reported Respiratory Self-Management Outcome: 4 (good)  Endocrine Endocrine Symptoms Reported: No symptoms reported Is patient diabetic?: No Endocrine Self-Management Outcome: 4 (good)  Gastrointestinal Gastrointestinal Symptoms Reported: Abdominal pain or discomfort, Constipation Gastrointestinal Management Strategies: Diet modification, Medication therapy Gastrointestinal  Self-Management Outcome: 3  (uncertain)    Genitourinary Genitourinary Symptoms Reported: Urgency Genitourinary Self-Management Outcome: 3 (uncertain)  Integumentary Integumentary Symptoms Reported: No symptoms reported Skin Self-Management Outcome: 4 (good)  Musculoskeletal Musculoskelatal Symptoms Reviewed: Joint pain Musculoskeletal Management Strategies: Medical device, Medication therapy, Routine screening Musculoskeletal Self-Management Outcome: 3 (uncertain)      Psychosocial Psychosocial Symptoms Reported: No symptoms reported            02/23/2024    1:50 PM  Depression screen PHQ 2/9  Decreased Interest 0  Down, Depressed, Hopeless 0  PHQ - 2 Score 0    There were no vitals filed for this visit.  Medications Reviewed Today   Medications were not reviewed in this encounter     Recommendation:   8/20/25PCP Follow-up Specialty provider follow-up GI,  Continue Current Plan of Care    Follow Up Plan:   Telephone follow up appointment date/time:  05/25/24 2 pm  Ajdin Macke L. Ramonita, RN, BSN, CCM [place]  [facility], [facility] RN Care Manager Direct Dial: [phone]  Fax: [phone]

## 2024-02-23 NOTE — Telephone Encounter (Signed)
 Patient notified VIA phone. Dm/cma

## 2024-02-23 NOTE — Patient Instructions (Signed)
 Visit Information  Thank you for taking time to visit with me today. Please don't hesitate to contact me if I can be of assistance to you before our next scheduled appointment.  Your next care management appointment is by telephone on 05/25/24 at 2 pm    Please call the care guide team at [phone] if you need to cancel, schedule, or reschedule an appointment.   Please call the Suicide and Crisis Lifeline: 988 call the [place]  National Suicide Prevention Lifeline: 808-627-8928 or TTY: (239)736-1224 TTY 406-043-7364) to talk to a trained counselor call 1-800-273-TALK (toll free, 24 hour hotline) call 911 if you are experiencing a Mental Health or Behavioral Health Crisis or need someone to talk to.  Braiden Presutti L. Ramonita, RN, BSN, CCM [place]  [facility], [facility] RN Care Manager Direct Dial: [phone]  Fax: [phone]

## 2024-02-23 NOTE — Telephone Encounter (Signed)
 Copied from CRM [id number]. Topic: Clinical - Medication Refill >> Feb 23, 2024  8:47 AM Berneda FALCON wrote: Medication: HYDROcodone -acetaminophen  (NORCO) 7.5-325 MG tablet  Has the patient contacted their pharmacy? No, went on MyChart (Agent: If no, request that the patient contact the pharmacy for the refill. If patient does not wish to contact the pharmacy document the reason why and proceed with request.) (Agent: If yes, when and what did the pharmacy advise?)  This is the patient's preferred pharmacy:  [facility] [address] [place] [address] Phone: [phone] Fax: [phone]  Is this the correct pharmacy for this prescription? Yes If no, delete pharmacy and type the correct one.   Has the prescription been filled recently? No  Is the patient out of the medication? No  Has the patient been seen for an appointment in the last year OR does the patient have an upcoming appointment? Yes  Can we respond through MyChart? Yes  Agent: Please be advised that Rx refills may take up to 3 business days. We ask that you follow-up with your pharmacy.

## 2024-02-24 ENCOUNTER — Other Ambulatory Visit (HOSPITAL_COMMUNITY): Payer: Self-pay

## 2024-02-24 MED ORDER — CARVEDILOL 6.25 MG PO TABS
6.2500 mg | ORAL_TABLET | Freq: Two times a day (BID) | ORAL | 2 refills | Status: AC
  Filled 2024-02-24: qty 180, 90d supply, fill #0
  Filled 2024-06-05 – 2024-06-19 (×2): qty 180, 90d supply, fill #1

## 2024-02-24 MED ORDER — EZETIMIBE 10 MG PO TABS
10.0000 mg | ORAL_TABLET | Freq: Every day | ORAL | 2 refills | Status: AC
  Filled 2024-02-24: qty 90, 90d supply, fill #0
  Filled 2024-06-19: qty 90, 90d supply, fill #1

## 2024-02-24 NOTE — Telephone Encounter (Signed)
 Called patient and informed her of Charlotte's comments. Patient stated that she understood and would like for [facility] to give her a call. When asked she stated that she would like for [facility] to give her a call. I thanked her for taking my call and will pass this to Plover. She thanked me for calling.

## 2024-02-29 ENCOUNTER — Other Ambulatory Visit (HOSPITAL_COMMUNITY): Payer: Self-pay

## 2024-03-14 ENCOUNTER — Ambulatory Visit (INDEPENDENT_AMBULATORY_CARE_PROVIDER_SITE_OTHER): Admitting: Nurse Practitioner

## 2024-03-14 ENCOUNTER — Other Ambulatory Visit: Payer: Self-pay

## 2024-03-14 ENCOUNTER — Encounter: Payer: Self-pay | Admitting: Nurse Practitioner

## 2024-03-14 ENCOUNTER — Ambulatory Visit: Admitting: Nurse Practitioner

## 2024-03-14 ENCOUNTER — Other Ambulatory Visit (HOSPITAL_COMMUNITY): Payer: Self-pay

## 2024-03-14 VITALS — BP 134/72 | HR 56 | Temp 98.5°F | Ht 64.0 in | Wt 197.2 lb

## 2024-03-14 DIAGNOSIS — Z79899 Other long term (current) drug therapy: Secondary | ICD-10-CM | POA: Diagnosis not present

## 2024-03-14 DIAGNOSIS — K5903 Drug induced constipation: Secondary | ICD-10-CM | POA: Diagnosis not present

## 2024-03-14 DIAGNOSIS — I10 Essential (primary) hypertension: Secondary | ICD-10-CM | POA: Diagnosis not present

## 2024-03-14 DIAGNOSIS — I700 Atherosclerosis of aorta: Secondary | ICD-10-CM | POA: Diagnosis not present

## 2024-03-14 DIAGNOSIS — M51372 Other intervertebral disc degeneration, lumbosacral region with discogenic back pain and lower extremity pain: Secondary | ICD-10-CM | POA: Diagnosis not present

## 2024-03-14 DIAGNOSIS — F1120 Opioid dependence, uncomplicated: Secondary | ICD-10-CM

## 2024-03-14 DIAGNOSIS — G8929 Other chronic pain: Secondary | ICD-10-CM

## 2024-03-14 MED ORDER — HYDROCHLOROTHIAZIDE 25 MG PO TABS
25.0000 mg | ORAL_TABLET | Freq: Every day | ORAL | 1 refills | Status: DC
  Filled 2024-03-14: qty 90, 90d supply, fill #0

## 2024-03-14 MED ORDER — HYDRALAZINE HCL 25 MG PO TABS
25.0000 mg | ORAL_TABLET | Freq: Two times a day (BID) | ORAL | 3 refills | Status: AC
  Filled 2024-03-14 – 2024-04-23 (×2): qty 180, 90d supply, fill #0
  Filled 2024-08-06: qty 180, 90d supply, fill #1

## 2024-03-14 MED ORDER — HYDROCODONE-ACETAMINOPHEN 7.5-325 MG PO TABS: 1.0000 | ORAL_TABLET | Freq: Three times a day (TID) | ORAL | 0 refills | Status: DC

## 2024-03-14 MED ORDER — AMLODIPINE BESYLATE 10 MG PO TABS
10.0000 mg | ORAL_TABLET | Freq: Every day | ORAL | 3 refills | Status: AC
  Filled 2024-03-14 – 2024-03-26 (×2): qty 90, 90d supply, fill #0
  Filled 2024-08-30 (×2): qty 90, 90d supply, fill #1

## 2024-03-14 MED ORDER — GABAPENTIN 100 MG PO CAPS
200.0000 mg | ORAL_CAPSULE | Freq: Every day | ORAL | 1 refills | Status: DC
  Filled 2024-03-14: qty 180, 90d supply, fill #0

## 2024-03-14 MED ORDER — HYDROCODONE-ACETAMINOPHEN 7.5-325 MG PO TABS
1.0000 | ORAL_TABLET | Freq: Three times a day (TID) | ORAL | 0 refills | Status: DC
  Filled 2024-04-30: qty 90, 30d supply, fill #0

## 2024-03-14 NOTE — Progress Notes (Signed)
 Established Patient Visit  Patient: Heather Shaw   DOB: 25-Apr-1941   83 y.o. Female  MRN: [id number] Visit Date: 03/14/2024  Subjective:    Chief Complaint  Patient presents with   Follow-up    Recent ED visit for blocked bladder and bowel  Discuss Chronic pain in back, legs and shoulders    HPI Atherosclerosis of aorta (HCC) Declined to return to advanced lipid clinic due to potential side effects of PCSK9-I Opted to maintain only zetia  at this time. She understands her risk for a cardiovascular event.   Chronic pain UTD with controlled substance contract and UDS Pain controlled with current dose of hydrocodone  7.5/325mg  1tab TID, and gabapentin  200mg  at hsprn No red flag in PMP database: last filled 12/30/2023, 07/03//2025, and 02/29/2024 Denies daytime somnolence or confusion or fall Had an episode of severe constipation and urinary retention which led to an ED visit 27month ago. Symptoms now resolved with use of miralax  daily She lives alone and independent with ADLs: able to drive  No change in med doses Advised to use gabapentin  daily. Refilled sent for 03/30/2024, 04/30/2024, and 05/31/2024 F/up in 6months(need UDS and renew contract)  Reviewed medical, surgical, and social history today  Medications: Outpatient Medications Prior to Visit  Medication Sig   carvedilol  (COREG ) 6.25 MG tablet Take 1 tablet (6.25 mg total) by mouth 2 (two) times daily.   Cholecalciferol (VITAMIN D3) 50 MCG (2000 UT) capsule Take 1 capsule (2,000 Units total) by mouth daily.   ezetimibe  (ZETIA ) 10 MG tablet Take 1 tablet (10 mg total) by mouth daily.   fluocinonide -emollient (LIDEX -E) 0.05 % cream Apply 1 Application topically 2 (two) times daily as needed.   meclizine  (ANTIVERT ) 25 MG tablet Take 25 mg by mouth 3 (three) times daily as needed for dizziness.   Meth-Hyo-M Bl-Na Phos-Ph Sal (URO-MP) 118 MG CAPS Take 1 capsule by mouth 3 (three) times daily as needed.    traZODone  (DESYREL ) 50 MG tablet Take 1/2 tablet (25 mg total) by mouth at bedtime as needed for sleep.   [DISCONTINUED] amLODipine  (NORVASC ) 10 MG tablet Take 1 tablet (10 mg total) by mouth daily.   [DISCONTINUED] gabapentin  (NEURONTIN ) 100 MG capsule Take 2 capsules (200 mg total) by mouth at bedtime.   [DISCONTINUED] hydrALAZINE  (APRESOLINE ) 25 MG tablet Take 1 tablet (25 mg total) by mouth 2 (two) times daily.   [DISCONTINUED] hydrochlorothiazide  (HYDRODIURIL ) 25 MG tablet Take 1 tablet (25 mg total) by mouth daily.   [DISCONTINUED] HYDROcodone -acetaminophen  (NORCO) 7.5-325 MG tablet Take 1 tablet by mouth every 8 (eight) hours. (02/29/24)   [DISCONTINUED] REPATHA  SURECLICK 140 MG/ML SOAJ    [DISCONTINUED] Bempedoic Acid  (NEXLETOL ) 180 MG TABS Take 1 tablet (180 mg total) by mouth daily. (Patient not taking: Reported on 03/14/2024)   [DISCONTINUED] estradiol  (ESTRACE ) 0.1 MG/GM vaginal cream Place 1 Applicatorful vaginally 2-3 (two to three) times a week as directed. (Patient not taking: Reported on 03/14/2024)   Facility-Administered Medications Prior to Visit  Medication Dose Route Frequency Provider   0.9 %  sodium chloride  infusion  500 mL Intravenous Once Pyrtle, Gordy HERO, MD   Reviewed past medical and social history.   ROS per HPI above      Objective:  BP 134/72 (BP Location: Left Arm, Patient Position: Sitting, Cuff Size: Large)   Pulse (!) 56   Temp 98.5 F (36.9 C) (Oral)   Ht 5' 4 (1.626 m)  Wt 197 lb 3.2 oz (89.4 kg)   SpO2 98%   BMI 33.85 kg/m      Physical Exam Vitals and nursing note reviewed.  Constitutional:      Appearance: She is obese.  Cardiovascular:     Rate and Rhythm: Normal rate and regular rhythm.     Pulses: Normal pulses.     Heart sounds: Normal heart sounds.  Pulmonary:     Effort: Pulmonary effort is normal.     Breath sounds: Normal breath sounds.  Abdominal:     General: Bowel sounds are normal. There is no distension.     Palpations:  Abdomen is soft.     Tenderness: There is no abdominal tenderness. There is no guarding.  Musculoskeletal:     Right lower leg: No edema.     Left lower leg: No edema.  Neurological:     Mental Status: She is alert.  Psychiatric:        Mood and Affect: Mood normal.        Behavior: Behavior normal.        Thought Content: Thought content normal.     No results found for any visits on 03/14/24.    Assessment & Plan:    Problem List Items Addressed This Visit     Atherosclerosis of aorta (HCC)   Declined to return to advanced lipid clinic due to potential side effects of PCSK9-I Opted to maintain only zetia  at this time. She understands her risk for a cardiovascular event.       Relevant Medications   amLODipine  (NORVASC ) 10 MG tablet   hydrALAZINE  (APRESOLINE ) 25 MG tablet   hydrochlorothiazide  (HYDRODIURIL ) 25 MG tablet   Chronic pain - Primary   UTD with controlled substance contract and UDS Pain controlled with current dose of hydrocodone  7.5/325mg  1tab TID, and gabapentin  200mg  at hsprn No red flag in PMP database: last filled 12/30/2023, 07/03//2025, and 02/29/2024 Denies daytime somnolence or confusion or fall Had an episode of severe constipation and urinary retention which led to an ED visit 30month ago. Symptoms now resolved with use of miralax  daily She lives alone and independent with ADLs: able to drive  No change in med doses Advised to use gabapentin  daily. Refilled sent for 03/30/2024, 04/30/2024, and 05/31/2024 F/up in 64months(need UDS and renew contract)      Relevant Medications   gabapentin  (NEURONTIN ) 100 MG capsule   HYDROcodone -acetaminophen  (NORCO) 7.5-325 MG tablet (Start on 03/30/2024)   HYDROcodone -acetaminophen  (NORCO) 7.5-325 MG tablet (Start on 04/30/2024)   HYDROcodone -acetaminophen  (NORCO) 7.5-325 MG tablet (Start on 05/31/2024)   Constipation   Controlled substance agreement signed   Relevant Medications   HYDROcodone -acetaminophen   (NORCO) 7.5-325 MG tablet (Start on 03/30/2024)   HYDROcodone -acetaminophen  (NORCO) 7.5-325 MG tablet (Start on 04/30/2024)   HYDROcodone -acetaminophen  (NORCO) 7.5-325 MG tablet (Start on 05/31/2024)   Degeneration of lumbosacral intervertebral disc   Relevant Medications   gabapentin  (NEURONTIN ) 100 MG capsule   HYDROcodone -acetaminophen  (NORCO) 7.5-325 MG tablet (Start on 03/30/2024)   HYDROcodone -acetaminophen  (NORCO) 7.5-325 MG tablet (Start on 04/30/2024)   HYDROcodone -acetaminophen  (NORCO) 7.5-325 MG tablet (Start on 05/31/2024)   Hypertensive disorder   Relevant Medications   amLODipine  (NORVASC ) 10 MG tablet   hydrALAZINE  (APRESOLINE ) 25 MG tablet   hydrochlorothiazide  (HYDRODIURIL ) 25 MG tablet   Opioid type dependence, continuous (HCC)   Relevant Medications   HYDROcodone -acetaminophen  (NORCO) 7.5-325 MG tablet (Start on 03/30/2024)   HYDROcodone -acetaminophen  (NORCO) 7.5-325 MG tablet (Start on 04/30/2024)   HYDROcodone -acetaminophen  (NORCO) 7.5-325  MG tablet (Start on 05/31/2024)   Return in about 3 months (around 06/14/2024) for HTN, prediabetes, chronic pain, hyperlipidemia (fasting)-Needs renewed pain contract and UDS.     Roselie Mood, NP

## 2024-03-14 NOTE — Assessment & Plan Note (Addendum)
 Declined to return to advanced lipid clinic due to potential side effects of PCSK9-I Opted to maintain only zetia  at this time. She understands her risk for a cardiovascular event.

## 2024-03-14 NOTE — Assessment & Plan Note (Addendum)
 UTD with controlled substance contract and UDS Pain controlled with current dose of hydrocodone  7.5/325mg  1tab TID, and gabapentin  200mg  at hsprn No red flag in PMP database: last filled 12/30/2023, 07/03//2025, and 02/29/2024 Denies daytime somnolence or confusion or fall Had an episode of severe constipation and urinary retention which led to an ED visit 56month ago. Symptoms now resolved with use of miralax  daily She lives alone and independent with ADLs: able to drive  No change in med doses Advised to use gabapentin  daily. Refilled sent for 03/30/2024, 04/30/2024, and 05/31/2024 F/up in 32months(need UDS and renew contract)

## 2024-03-14 NOTE — Patient Instructions (Signed)
 Maintain current med doses Use miralax  17g daily, high fiber diet and adequate oral hydration-at least 60oz daily. Take gabapenitn daily

## 2024-03-19 DIAGNOSIS — N83209 Unspecified ovarian cyst, unspecified side: Secondary | ICD-10-CM | POA: Diagnosis not present

## 2024-03-19 DIAGNOSIS — D259 Leiomyoma of uterus, unspecified: Secondary | ICD-10-CM | POA: Diagnosis not present

## 2024-03-26 ENCOUNTER — Encounter (HOSPITAL_COMMUNITY): Payer: Self-pay

## 2024-03-26 ENCOUNTER — Other Ambulatory Visit: Payer: Self-pay

## 2024-03-26 ENCOUNTER — Observation Stay (HOSPITAL_COMMUNITY)
Admit: 2024-03-26 | Discharge: 2024-03-29 | Disposition: A | Discharge status: Home / Self Care | Source: Other Acute Inpatient Hospital | Attending: Internal Medicine | Admitting: Internal Medicine

## 2024-03-26 ENCOUNTER — Other Ambulatory Visit (HOSPITAL_COMMUNITY): Payer: Self-pay

## 2024-03-26 ENCOUNTER — Inpatient Hospital Stay (HOSPITAL_COMMUNITY)

## 2024-03-26 DIAGNOSIS — G8929 Other chronic pain: Secondary | ICD-10-CM | POA: Diagnosis not present

## 2024-03-26 DIAGNOSIS — Z87891 Personal history of nicotine dependence: Secondary | ICD-10-CM | POA: Diagnosis not present

## 2024-03-26 DIAGNOSIS — E785 Hyperlipidemia, unspecified: Secondary | ICD-10-CM | POA: Diagnosis not present

## 2024-03-26 DIAGNOSIS — Z79899 Other long term (current) drug therapy: Secondary | ICD-10-CM | POA: Diagnosis not present

## 2024-03-26 DIAGNOSIS — M51372 Other intervertebral disc degeneration, lumbosacral region with discogenic back pain and lower extremity pain: Secondary | ICD-10-CM

## 2024-03-26 DIAGNOSIS — K8010 Calculus of gallbladder with chronic cholecystitis without obstruction: Secondary | ICD-10-CM | POA: Diagnosis not present

## 2024-03-26 DIAGNOSIS — I10 Essential (primary) hypertension: Secondary | ICD-10-CM | POA: Diagnosis not present

## 2024-03-26 DIAGNOSIS — F1120 Opioid dependence, uncomplicated: Secondary | ICD-10-CM | POA: Diagnosis not present

## 2024-03-26 DIAGNOSIS — Z888 Allergy status to other drugs, medicaments and biological substances status: Secondary | ICD-10-CM | POA: Diagnosis not present

## 2024-03-26 DIAGNOSIS — Z886 Allergy status to analgesic agent status: Secondary | ICD-10-CM | POA: Diagnosis not present

## 2024-03-26 DIAGNOSIS — K828 Other specified diseases of gallbladder: Secondary | ICD-10-CM | POA: Diagnosis not present

## 2024-03-26 DIAGNOSIS — G894 Chronic pain syndrome: Secondary | ICD-10-CM | POA: Diagnosis not present

## 2024-03-26 DIAGNOSIS — I471 Supraventricular tachycardia, unspecified: Secondary | ICD-10-CM | POA: Diagnosis not present

## 2024-03-26 DIAGNOSIS — R001 Bradycardia, unspecified: Secondary | ICD-10-CM | POA: Diagnosis present

## 2024-03-26 DIAGNOSIS — Z882 Allergy status to sulfonamides status: Secondary | ICD-10-CM | POA: Diagnosis not present

## 2024-03-26 DIAGNOSIS — N83209 Unspecified ovarian cyst, unspecified side: Secondary | ICD-10-CM | POA: Insufficient documentation

## 2024-03-26 DIAGNOSIS — M1990 Unspecified osteoarthritis, unspecified site: Secondary | ICD-10-CM | POA: Diagnosis not present

## 2024-03-26 DIAGNOSIS — K819 Cholecystitis, unspecified: Principal | ICD-10-CM | POA: Diagnosis present

## 2024-03-26 DIAGNOSIS — R109 Unspecified abdominal pain: Secondary | ICD-10-CM | POA: Diagnosis not present

## 2024-03-26 DIAGNOSIS — M549 Dorsalgia, unspecified: Secondary | ICD-10-CM | POA: Diagnosis not present

## 2024-03-26 DIAGNOSIS — R079 Chest pain, unspecified: Secondary | ICD-10-CM | POA: Diagnosis not present

## 2024-03-26 MED ORDER — ENOXAPARIN SODIUM 40 MG/0.4ML IJ SOSY
40.0000 mg | PREFILLED_SYRINGE | INTRAMUSCULAR | Status: DC
  Administered 2024-03-27 – 2024-03-28 (×2): 40 mg via SUBCUTANEOUS
  Filled 2024-03-26 (×2): qty 0.4

## 2024-03-26 MED ORDER — HYDROMORPHONE HCL 1 MG/ML IJ SOLN
0.5000 mg | INTRAMUSCULAR | Status: DC | PRN
  Administered 2024-03-27: 0.5 mg via INTRAVENOUS
  Filled 2024-03-26: qty 0.5

## 2024-03-26 MED ORDER — ONDANSETRON HCL 4 MG PO TABS
4.0000 mg | ORAL_TABLET | Freq: Four times a day (QID) | ORAL | Status: DC | PRN
Start: 2024-03-26 — End: 2024-03-29

## 2024-03-26 MED ORDER — LACTATED RINGERS IV SOLN: INTRAVENOUS | Status: AC

## 2024-03-26 MED ORDER — CARVEDILOL 6.25 MG PO TABS
6.2500 mg | ORAL_TABLET | Freq: Two times a day (BID) | ORAL | Status: DC
  Administered 2024-03-26 – 2024-03-27 (×2): 6.25 mg via ORAL
  Filled 2024-03-26 (×2): qty 1

## 2024-03-26 MED ORDER — EZETIMIBE 10 MG PO TABS: 10.0000 mg | ORAL_TABLET | Freq: Every day | ORAL | Status: DC

## 2024-03-26 MED ORDER — ONDANSETRON HCL 4 MG/2ML IJ SOLN: 4.0000 mg | Freq: Four times a day (QID) | INTRAMUSCULAR | Status: DC | PRN

## 2024-03-26 MED ORDER — ACETAMINOPHEN 650 MG RE SUPP
650.0000 mg | Freq: Four times a day (QID) | RECTAL | Status: DC | PRN
Start: 2024-03-26 — End: 2024-03-29

## 2024-03-26 MED ORDER — PIPERACILLIN-TAZOBACTAM 3.375 G IVPB
3.3750 g | Freq: Three times a day (TID) | INTRAVENOUS | Status: DC
  Administered 2024-03-26 – 2024-03-28 (×5): 3.375 g via INTRAVENOUS
  Filled 2024-03-26 (×5): qty 50

## 2024-03-26 MED ORDER — DOCUSATE SODIUM 100 MG PO CAPS
100.0000 mg | ORAL_CAPSULE | Freq: Every day | ORAL | Status: DC
  Administered 2024-03-27 – 2024-03-28 (×2): 100 mg via ORAL
  Filled 2024-03-26 (×2): qty 1

## 2024-03-26 MED ORDER — AMLODIPINE BESYLATE 10 MG PO TABS
10.0000 mg | ORAL_TABLET | Freq: Every day | ORAL | Status: DC
Start: 2024-03-27 — End: 2024-03-29
  Administered 2024-03-27 – 2024-03-29 (×2): 10 mg via ORAL
  Filled 2024-03-26 (×2): qty 1

## 2024-03-26 MED ORDER — BISACODYL 5 MG PO TBEC: 5.0000 mg | DELAYED_RELEASE_TABLET | Freq: Every day | ORAL | Status: DC | PRN

## 2024-03-26 MED ORDER — HYDROCODONE-ACETAMINOPHEN 7.5-325 MG PO TABS
1.0000 | ORAL_TABLET | Freq: Three times a day (TID) | ORAL | Status: DC
  Administered 2024-03-26 – 2024-03-29 (×7): 1 via ORAL
  Filled 2024-03-26 (×7): qty 1

## 2024-03-26 MED ORDER — ACETAMINOPHEN 325 MG PO TABS: 650.0000 mg | ORAL_TABLET | Freq: Four times a day (QID) | ORAL | Status: DC | PRN

## 2024-03-26 MED ORDER — TRAZODONE HCL 50 MG PO TABS
25.0000 mg | ORAL_TABLET | Freq: Every evening | ORAL | Status: DC | PRN
  Administered 2024-03-27 – 2024-03-28 (×2): 25 mg via ORAL
  Filled 2024-03-26 (×2): qty 1

## 2024-03-26 MED ORDER — MELATONIN 3 MG PO TABS: 3.0000 mg | ORAL_TABLET | Freq: Every evening | ORAL | Status: DC | PRN

## 2024-03-26 MED ORDER — HYDRALAZINE HCL 25 MG PO TABS
25.0000 mg | ORAL_TABLET | Freq: Two times a day (BID) | ORAL | Status: DC
  Administered 2024-03-26 – 2024-03-29 (×5): 25 mg via ORAL
  Filled 2024-03-26 (×5): qty 1

## 2024-03-26 NOTE — ED Triage Notes (Signed)
 Patient reports chest pain, back pain, abdominal pain nausea, vomiting (x2 today), dizziness. She took omeprazole , TUMS without relief.    Denies shortness of breath,

## 2024-03-26 NOTE — Plan of Care (Signed)
 Plan of Care Note for accepted transfer   Patient: Heather Shaw MRN: [id number]   DOA: (Not on file)  Facility requesting transfer: [facility] Requesting Provider: Dr. Perla Reason for transfer: abd pain Facility course:  Ms. Guardino is an 83 year old female with PMH chronic pain, HTN, HLD who presented with abdominal pain, vomiting, dizziness.  She also has a known chronic left adnexal mass. She underwent CT angio aortic dissection protocol: IMPRESSION: The gallbladder is mildly distended with mural hyperenhancement, wall thickening, and mild pericholecystic stranding which may be seen with acute cholecystitis in the appropriate clinical setting. No biliary ductal dilation. No evidence of acute aortic syndrome. Multifibroid uterus as described. Consider nonemergent outpatient MRI pelvis with and without IV contrast as clinically indicated.   Case was discussed with Dr. Ebbie with general surgery and she was recommended for transfer for ongoing workup and will need RUQ ultrasound on transfer for further workup as well. She was started on Zosyn .  Recent vitals include: BP 176/84, heart rate 66, 98.9 F, RR 16, 95% on room air  WBC 11.5, Hgb 14.8 g/dL, platelets 774 PT 89.0, INR 0.96 Chemistry panel unremarkable LFTs: Total bili 2.4, AST 336, ALT 131, alk phos 69  Plan of care: The patient is accepted for admission to Telemetry unit, at [facility].  Author: Alm Apo, MD 03/26/2024  Check [email or web address] for on-call coverage.  Nursing staff, Please call [facility] Admits & Consults System-Wide number on Amion as soon as patient's arrival, so appropriate admitting provider can evaluate the pt.

## 2024-03-26 NOTE — H&P (Addendum)
 History and Physical    Patient: Heather Shaw FMW:[id number] DOB: 10/19/1940 DOA: 03/26/2024 DOS: the patient was seen and examined on 03/26/2024 PCP: Katheen Roselie Rockford, NP  Patient coming from: Outside Hospital  Chief Complaint:  Chief Complaint  Patient presents with   Cholecystitis   HPI: Heather Shaw is a 83 y.o. female with medical history significant for chronic opioid use for chronic pain due to interstitial cystitis, hyperlipidemia and statin intolerance, hypertension, and a growing left ovarian cyst presents after being awakened at 530 this morning with severe burning indigestion and abdominal pain.  The patient has had more trouble with indigestion over the last couple of months but nothing like the pain she had today.  The pain radiated into her left shoulder and back that is when she decided to go to the emergency department.  She also reports that her abdomen became distended and tight like a basketball.  The burning was all the way from her upper mid chest down through her abdomen.  She denies any fevers or chills.  She has not had any vomiting.  She takes chronic narcotics and has had some trouble with constipation in the past but denies feeling that way recently. Her closest emergency department is [facility] so she went there this morning.  Her evaluation there included a CT scan of her abdomen pelvis which revealed gallbladder wall thickening a distended gallbladder and  pericholecystic stranding.  The patient elected to come to [facility] because she appreciates this hospital from when her husband was ill and spent a lot of time here.  Her daughters live in [place].   Review of Systems: As mentioned in the history of present illness. All other systems reviewed and are negative. Past Medical History:  Diagnosis Date   Angio-edema 03/27/2021   Formatting of this note might be different from the original.  Last Assessment & Plan:   Formatting of this note might be  different from the original.  Resolved.  Likely due to IV contrast (lisinopril  also d/c'd)     Arthritis    Bladder pain    Bradycardia    DDD (degenerative disc disease), lumbosacral    Grief 05/24/2018   Hyperlipidemia    Hyperplastic colon polyp    Hypertension    IC (interstitial cystitis)    Insomnia 04/06/2010   Qualifier: Diagnosis of  By: Jenetta MD, Talia     Internal hemorrhoids    Polyp of rectum    PONV (postoperative nausea and vomiting)    Right wrist tendonitis 08/24/2017   Squamous papilloma    in esophageal polyp   Uterine fibroid    Vertigo    Wears glasses    Wears partial dentures    Past Surgical History:  Procedure Laterality Date   CARPAL TUNNEL RELEASE Bilateral    COLONOSCOPY  11/02/2010   COLONOSCOPY W/ POLYPECTOMY  07/27/2007   CYSTO WITH HYDRODISTENSION N/A 05/20/2014   Procedure: CYSTOSCOPY/HYDRODISTENSION, INSTILLATION OF CHLORPACTIN;  Surgeon: Alm GORMAN Fragmin, MD;  Location: [facility];  Service: Urology;  Laterality: N/A;   CYSTO/  HYDRODISTENTION/  INSTILLATION CLORPACTIC  09/21/2010   DILATION AND CURETTAGE OF UTERUS  07/26/1986   KNEE ARTHROSCOPY Right 11/18/2009   TRIGGER FINGER RELEASE Right 08/23/2016   Procedure: RIGHT RING FINGER TRIGGER RELEASE;  Surgeon: Alm Hummer, MD;  Location: [facility];  Service: Orthopedics;  Laterality: Right;   UPPER GASTROINTESTINAL ENDOSCOPY  10/17/2015   Social History:  reports that she  quit smoking about 52 years ago. Her smoking use included cigarettes. She has never used smokeless tobacco. She reports current drug use. Drug: Hydrocodone . She reports that she does not drink alcohol.  Allergies  Allergen Reactions   Contrast Media [Iodinated Contrast Media] Other (See Comments)    ? angioedema   Lisinopril  Other (See Comments)    ? angioedema   Repatha  [Evolocumab ] Other (See Comments)    Arthritis like pain   Allegra [Fexofenadine] Other (See Comments)    Makes pt  nervous   Codeine Nausea And Vomiting   Losartan  Other (See Comments)    myalgia   Nsaids Other (See Comments)    stomach on fire feeling    Prednisone  Other (See Comments)    Causes elevation in blood pressure- ? All steroids cause same reaction   Pseudoephedrine Hcl Nausea And Vomiting   Sulfa Antibiotics Rash   Sulfur Rash   Zithromax  [Azithromycin ] Other (See Comments)    Burns stomach    Family History  Problem Relation Age of Onset   Clotting disorder Mother 54   COPD Father    Heart attack Father 56   Emphysema Father    Hypertension Sister    Hypertension Brother    Stroke Paternal Aunt    Heart disease Maternal Grandmother    Hypertension Maternal Grandmother    Heart disease Maternal Grandfather    Hypertension Maternal Grandfather    Heart disease Paternal Grandmother    Hypertension Paternal Grandmother    Heart disease Paternal Grandfather    Hypertension Paternal Grandfather    Cystic fibrosis Son    Diabetes Son    Colon polyps Son    Hypertension Son    Colon cancer Neg Hx    Esophageal cancer Neg Hx    Stomach cancer Neg Hx    Rectal cancer Neg Hx     Prior to Admission medications   Medication Sig Start Date End Date Taking? Authorizing Provider  amLODipine  (NORVASC ) 10 MG tablet Take 1 tablet (10 mg total) by mouth daily. 03/14/24   [name], Roselie Rockford, NP  carvedilol  (COREG ) 6.25 MG tablet Take 1 tablet (6.25 mg total) by mouth 2 (two) times daily. 02/24/24   Hilty, Vinie BROCKS, MD  Cholecalciferol (VITAMIN D3) 50 MCG (2000 UT) capsule Take 1 capsule (2,000 Units total) by mouth daily. 04/15/23   [name], Roselie Rockford, NP  ezetimibe  (ZETIA ) 10 MG tablet Take 1 tablet (10 mg total) by mouth daily. 02/24/24 05/29/24  Mona Vinie BROCKS, MD  fluocinonide -emollient (LIDEX -E) 0.05 % cream Apply 1 Application topically 2 (two) times daily as needed. 03/29/23   [name], Roselie Rockford, NP  gabapentin  (NEURONTIN ) 100 MG capsule Take 2 capsules (200 mg total) by mouth at  bedtime. 03/14/24   [name], Roselie Rockford, NP  hydrALAZINE  (APRESOLINE ) 25 MG tablet Take 1 tablet (25 mg total) by mouth 2 (two) times daily. 03/14/24   [name], Roselie Rockford, NP  hydrochlorothiazide  (HYDRODIURIL ) 25 MG tablet Take 1 tablet (25 mg total) by mouth daily. 03/14/24   [name], Charlotte Lum, NP  HYDROcodone -acetaminophen  (NORCO) 7.5-325 MG tablet Take 1 tablet by mouth every 8 (eight) hours. (02/29/24) 03/30/24   [name], Roselie Rockford, NP  HYDROcodone -acetaminophen  (NORCO) 7.5-325 MG tablet Take 1 tablet by mouth every 8 (eight) hours. (02/29/24) 04/30/24   [name], Roselie Rockford, NP  HYDROcodone -acetaminophen  (NORCO) 7.5-325 MG tablet Take 1 tablet by mouth every 8 (eight) hours. 05/31/24   [name], Roselie Rockford, NP  meclizine  (ANTIVERT ) 25 MG tablet Take  25 mg by mouth 3 (three) times daily as needed for dizziness.    [provider]  Meth-Hyo-M Bl-Na Phos-Ph Sal (URO-MP) 118 MG CAPS Take 1 capsule by mouth 3 (three) times daily as needed. 05/13/23   [provider]  traZODone  (DESYREL ) 50 MG tablet Take 1/2 tablet (25 mg total) by mouth at bedtime as needed for sleep. 11/02/23   [name], Roselie Rockford, NP    Physical Exam: Vitals:   03/26/24 2119  BP: (!) 166/90  Pulse: 62  Resp: 18  Temp: 98.7 F (37.1 C)  TempSrc: Oral  SpO2: 96%   Physical Exam:  General: No acute distress, well developed, well nourished HEENT: Normocephalic, atraumatic, PERRL Cardiovascular: Normal rate and rhythm. Distal pulses intact. Pulmonary: Normal pulmonary effort, normal breath sounds Gastrointestinal: Nondistended abdomen, soft, tender in mid epigastric area and LLQ, hypoactive bowel sounds Musculoskeletal:Normal ROM, no lower ext edema Skin: Skin is warm and dry. Neuro: No focal deficits noted, AAOx3. PSYCH: Attentive and cooperative  Data Reviewed:  No results found for this or any previous visit (from the past 24 hours).   Assessment and Plan: Possible cholecystitis - - General Surgery  has been consulted - right upper quadrant ultrasound is pending - N.p.o., IV fluids, pain control - IV Zosyn  empirically  2. Htn - Continue oral hypertensive medications: Norvasc , Coreg , Hydralazine .  Hold HCTZ  3. Hyperlipidemia -  Continue Zetia    4. GYN -the patient follows with GYN for a left ovarian cyst which is apparently growing in size and causing pain and fibroids.  5.  Interstitial cystitis and chronic pain - Continue hydrocodone  3 times daily  Med list has not yet been verified.  Advance Care Planning:   Code Status: Full Code  The patient names her daughter Wanda as her Social research officer, government.  She wants to be DNR.  Consults: General Surgery, Dr. Ebbie  Family Communication: The patient's 2 daughters and son-in-law were present at bedside  Severity of Illness: The appropriate patient status for this patient is INPATIENT. Inpatient status is judged to be reasonable and necessary in order to provide the required intensity of service to ensure the patient's safety. The patient's presenting symptoms, physical exam findings, and initial radiographic and laboratory data in the context of their chronic comorbidities is felt to place them at high risk for further clinical deterioration. Furthermore, it is not anticipated that the patient will be medically stable for discharge from the hospital within 2 midnights of admission.   * I certify that at the point of admission it is my clinical judgment that the patient will require inpatient hospital care spanning beyond 2 midnights from the point of admission due to high intensity of service, high risk for further deterioration and high frequency of surveillance required.*  Author: ARTHEA CHILD, MD 03/26/2024 10:36 PM  For on call review [email or web address].

## 2024-03-27 ENCOUNTER — Other Ambulatory Visit: Payer: Self-pay

## 2024-03-27 ENCOUNTER — Other Ambulatory Visit (HOSPITAL_COMMUNITY): Payer: Self-pay

## 2024-03-27 DIAGNOSIS — E872 Acidosis, unspecified: Secondary | ICD-10-CM | POA: Diagnosis not present

## 2024-03-27 DIAGNOSIS — K819 Cholecystitis, unspecified: Secondary | ICD-10-CM | POA: Diagnosis not present

## 2024-03-27 DIAGNOSIS — K8010 Calculus of gallbladder with chronic cholecystitis without obstruction: Secondary | ICD-10-CM | POA: Diagnosis not present

## 2024-03-27 DIAGNOSIS — N83202 Unspecified ovarian cyst, left side: Secondary | ICD-10-CM

## 2024-03-27 DIAGNOSIS — F1120 Opioid dependence, uncomplicated: Secondary | ICD-10-CM | POA: Diagnosis not present

## 2024-03-27 DIAGNOSIS — R001 Bradycardia, unspecified: Secondary | ICD-10-CM

## 2024-03-27 DIAGNOSIS — I10 Essential (primary) hypertension: Secondary | ICD-10-CM | POA: Diagnosis not present

## 2024-03-27 DIAGNOSIS — G8929 Other chronic pain: Secondary | ICD-10-CM | POA: Diagnosis not present

## 2024-03-27 DIAGNOSIS — Z01810 Encounter for preprocedural cardiovascular examination: Secondary | ICD-10-CM | POA: Diagnosis not present

## 2024-03-27 DIAGNOSIS — E785 Hyperlipidemia, unspecified: Secondary | ICD-10-CM | POA: Diagnosis not present

## 2024-03-27 DIAGNOSIS — K810 Acute cholecystitis: Secondary | ICD-10-CM | POA: Diagnosis not present

## 2024-03-27 DIAGNOSIS — N83209 Unspecified ovarian cyst, unspecified side: Secondary | ICD-10-CM

## 2024-03-27 LAB — COMPREHENSIVE METABOLIC PANEL WITH GFR
ALT: 486 U/L — ABNORMAL HIGH (ref 0–44)
AST: 457 U/L — ABNORMAL HIGH (ref 15–41)
Albumin: 3.9 g/dL (ref 3.5–5.0)
Alkaline Phosphatase: 89 U/L (ref 38–126)
Anion gap: 13 (ref 5–15)
BUN: 12 mg/dL (ref 8–23)
CO2: 25 mmol/L (ref 22–32)
Calcium: 9.5 mg/dL (ref 8.9–10.3)
Chloride: 103 mmol/L (ref 98–111)
Creatinine, Ser: 0.73 mg/dL (ref 0.44–1.00)
GFR, Estimated: >60 mL/min (ref >60)
Glucose, Bld: 99 mg/dL (ref 70–99)
Potassium: 3.9 mmol/L (ref 3.5–5.1)
Sodium: 141 mmol/L (ref 135–145)
Total Bilirubin: 2 mg/dL — ABNORMAL HIGH (ref 0.0–1.2)
Total Protein: 6.3 g/dL — ABNORMAL LOW (ref 6.5–8.1)

## 2024-03-27 LAB — CBC
HCT: 41.3 % (ref 36.0–46.0)
Hemoglobin: 13.7 g/dL (ref 12.0–15.0)
MCH: 29 pg (ref 26.0–34.0)
MCHC: 33.2 g/dL (ref 30.0–36.0)
MCV: 87.5 fL (ref 80.0–100.0)
Platelets: 207 K/uL (ref 150–400)
RBC: 4.72 MIL/uL (ref 3.87–5.11)
RDW: 12.7 % (ref 11.5–15.5)
WBC: 9.6 K/uL (ref 4.0–10.5)
nRBC: 0 % (ref 0.0–0.2)

## 2024-03-27 MED ORDER — OXYCODONE HCL 5 MG PO TABS
5.0000 mg | ORAL_TABLET | ORAL | Status: DC | PRN
  Administered 2024-03-27 – 2024-03-29 (×5): 5 mg via ORAL
  Filled 2024-03-27 (×6): qty 1

## 2024-03-27 MED ORDER — POLYETHYLENE GLYCOL 3350 17 G PO PACK
17.0000 g | PACK | Freq: Every day | ORAL | Status: DC
  Administered 2024-03-27 – 2024-03-29 (×2): 17 g via ORAL
  Filled 2024-03-27 (×2): qty 1

## 2024-03-27 MED ORDER — CARVEDILOL 3.125 MG PO TABS
3.1250 mg | ORAL_TABLET | Freq: Two times a day (BID) | ORAL | Status: DC
  Administered 2024-03-27 – 2024-03-29 (×3): 3.125 mg via ORAL
  Filled 2024-03-27 (×3): qty 1

## 2024-03-27 NOTE — Hospital Course (Addendum)
 Partly taken from H&P.  Heather Shaw is a 83 y.o. female with medical history significant for chronic opioid use for chronic pain due to interstitial cystitis, hyperlipidemia and statin intolerance, hypertension, and a growing left ovarian cyst presents after being awakened at 530 this morning with severe burning indigestion and abdominal pain.  Pain was radiating to the shoulder and back.  Patient went to ER at [facility], CT abdomen and pelvis with gallbladder wall thickening, distended gallbladder and pericholecystic stranding concerning for cholecystitis, patient decided to come to [facility] after that.  Patient was admitted for concern of cholecystitis, surgery was consulted and she was started on Zosyn .  9/2: Vital stable, labs with transaminitis with AST 457, ALT 486 and T. bili of 2, RUQ ultrasound with concern of cholelithiasis and acute cholecystitis. General surgery is recommending cholecystectomy after obtaining cardiology clearance for anesthesia.

## 2024-03-27 NOTE — Plan of Care (Signed)
   Problem: Education: Goal: Knowledge of General Education information will improve Description: Including pain rating scale, medication(s)/side effects and non-pharmacologic comfort measures Outcome: Progressing   Problem: Safety: Goal: Ability to remain free from injury will improve Outcome: Progressing   Problem: Skin Integrity: Goal: Risk for impaired skin integrity will decrease Outcome: Progressing

## 2024-03-27 NOTE — Assessment & Plan Note (Signed)
 Imaging concerning for cholelithiasis and cholecystitis. General surgery was consulted, patient likely need cholecystectomy after getting cardiology clearance. - Continue with Zosyn  -Continue supportive care

## 2024-03-27 NOTE — Assessment & Plan Note (Signed)
 Patient also has an history of uterine fibroids. CT with concern of left ovarian cyst. - Outpatient follow-up with gynecology

## 2024-03-27 NOTE — Assessment & Plan Note (Signed)
-   Continue Zetia  -Apparently has refused to take statin.

## 2024-03-27 NOTE — Progress Notes (Signed)
  Progress Note   Patient: Heather Shaw FMW:[id number] DOB: [birth year] DOA: 03/26/2024     1 DOS: the patient was seen and examined on 03/27/2024   Brief hospital course: Partly taken from H&P.  Heather Shaw is a 83 y.o. female with medical history significant for chronic opioid use for chronic pain due to interstitial cystitis, hyperlipidemia and statin intolerance, hypertension, and a growing left ovarian cyst presents after being awakened at 530 this morning with severe burning indigestion and abdominal pain.  Pain was radiating to the shoulder and back.  Patient went to ER at [facility], CT abdomen and pelvis with gallbladder wall thickening, distended gallbladder and pericholecystic stranding concerning for cholecystitis, patient decided to come to [facility] after that.  Patient was admitted for concern of cholecystitis, surgery was consulted and she was started on Zosyn .  9/2: Vital stable, labs with transaminitis with AST 457, ALT 486 and T. bili of 2, RUQ ultrasound with concern of cholelithiasis and acute cholecystitis. General surgery is recommending cholecystectomy after obtaining cardiology clearance for anesthesia.  Assessment and Plan: * Cholecystitis Imaging concerning for cholelithiasis and cholecystitis. General surgery was consulted, patient likely need cholecystectomy after getting cardiology clearance. - Continue with Zosyn  -Continue supportive care  Bradycardia Asymptomatic and seems chronic, EKG with sinus bradycardia in mid 40s. Cardiology decreased the dose of carvedilol -will be discontinued at if bradycardia persists -Continue to monitor  Chronic pain Continue home Norco every 8 hourly -Added oxycodone  for breakthrough pain as continue to have significant pain due to cholecystitis and cholelithiasis  Dyslipidemia - Continue Zetia  -Apparently has refused to take statin.  Ovarian cyst Patient also has an history of uterine fibroids. CT with  concern of left ovarian cyst. - Outpatient follow-up with gynecology   Subjective: Patient continued to have abdominal pain, mostly in epigastrium and right upper quadrant.  At times she was having lower abdominal pain.  Last bowel movement was 2 days ago. SIL at bedside.   Physical Exam: Vitals:   03/27/24 0131 03/27/24 0558 03/27/24 0909 03/27/24 1313  BP: (!) 125/56 133/89 (!) 175/69 (!) 169/63  Pulse:  (!) 59 (!) 54 (!) 59  Resp: 17 17 16 14   Temp: 98.1 F (36.7 C) 99 F (37.2 C) 98.4 F (36.9 C) 97.6 F (36.4 C)  TempSrc: Oral Oral Oral Oral  SpO2: 95% 94% 97% 97%   General.  Overweight elderly lady, in no acute distress. Pulmonary.  Lungs clear bilaterally, normal respiratory effort. CV.  Regular rate and rhythm, no JVD, rub or murmur. Abdomen.  Soft, RUQ tenderness, nondistended, BS positive. CNS.  Alert and oriented .  No focal neurologic deficit. Extremities.  No edema, no cyanosis, pulses intact and symmetrical. Psychiatry.  Judgment and insight appears normal.   Data Reviewed: Prior data reviewed  Family Communication: Discussed with SIL at bedside  Disposition: Status is: Inpatient Remains inpatient appropriate because: Severity of illness, likely need cholecystectomy during current hospitalization.  Planned Discharge Destination: Home  DVT prophylaxis.  Lovenox  Time spent: 50 minutes  This record has been created using Conservation officer, historic buildings. Errors have been sought and corrected,but may not always be located. Such creation errors do not reflect on the standard of care.   Author: Amaryllis Dare, MD 03/27/2024 2:10 PM  For on call review [email or web address].

## 2024-03-27 NOTE — Anesthesia Preprocedure Evaluation (Signed)
 Anesthesia Evaluation  Patient identified by MRN, date of birth, ID band Patient awake    Reviewed: Allergy & Precautions, NPO status , Patient's Chart, lab work & pertinent test results, reviewed documented beta blocker date and time   History of Anesthesia Complications (+) PONV and history of anesthetic complications  Airway Mallampati: III  TM Distance: >3 FB     Dental  (+) Partial Lower, Partial Upper   Pulmonary former smoker   Pulmonary exam normal breath sounds clear to auscultation       Cardiovascular hypertension, Pt. on medications Normal cardiovascular exam Rhythm:Regular Rate:Normal  EKG 05/02/23 Sinus rhythm Left anterior fascicular block Abnormal R-wave progression, late transition  Echo 03/12/22 1. Left ventricular ejection fraction, by estimation, is 55 to 60%. The  left ventricle has normal function. The left ventricle has no regional  wall motion abnormalities. There is moderate concentric left ventricular  hypertrophy. Left ventricular  diastolic parameters were normal.   2. Right ventricular systolic function is normal. The right ventricular  size is normal.   3. The mitral valve is grossly normal. Trivial mitral valve  regurgitation.   4. The aortic valve is grossly normal. Aortic valve regurgitation is  trivial. No aortic stenosis is present.   5. The inferior vena cava is normal in size with greater than 50%  respiratory variability, suggesting right atrial pressure of 3 mmHg.     Neuro/Psych  PSYCHIATRIC DISORDERS       Neuromuscular disease    GI/Hepatic ,,,(+)     substance abuse  Elevated LFT's Abdominal US  9/1 1. Cholelithiasis with gallbladder wall thickening and pericholecystic fluid. Findings are concerning for acute cholecystitis. 2. Common bile duct measures within normal limits for patient's age.  Cholelithiasis with acute Cholecystitis   Endo/Other  Hyperlipidemia   Renal/GU negative Renal ROS   Hx/o IC and chonic bladder pain    Musculoskeletal  (+) Arthritis , Osteoarthritis,  narcotic dependent  Abdominal  (+) + obese  Peds  Hematology Hx/o angioedema 03/2021   Anesthesia Other Findings   Reproductive/Obstetrics                              Anesthesia Physical Anesthesia Plan  ASA: 3  Anesthesia Plan: General   Post-op Pain Management: Ofirmev  IV (intra-op)*, Precedex  and Dilaudid  IV   Induction: Intravenous, Rapid sequence and Cricoid pressure planned  PONV Risk Score and Plan: 4 or greater and Treatment may vary due to age or medical condition, Ondansetron , Propofol  infusion, TIVA and Diphenhydramine   Airway Management Planned: Oral ETT  Additional Equipment: None  Intra-op Plan:   Post-operative Plan: Extubation in OR  Informed Consent: I have reviewed the patients History and Physical, chart, labs and discussed the procedure including the risks, benefits and alternatives for the proposed anesthesia with the patient or authorized representative who has indicated his/her understanding and acceptance.     Dental advisory given  Plan Discussed with: Anesthesiologist and CRNA  Anesthesia Plan Comments:          Anesthesia Quick Evaluation

## 2024-03-27 NOTE — Consult Note (Addendum)
 Cardiology Consultation   Patient ID: Heather Shaw MRN: [id number]; DOB: 01-16-1941  Admit date: 03/26/2024 Date of Consult: 03/27/2024  PCP:  Katheen Roselie Rockford, NP   [facility] Providers Cardiologist:  Maude Emmer, MD        Patient Profile: Heather Shaw is a 83 y.o. female with a hx of interstitial cystitis on chronic opiates, HLD, HTN, aortic atherosclerosis, coronary calcification, intolerance to statins/Repatha , former tobacco abuse, bradycardia, SVT, ovarian cyst who is being seen 03/27/2024 for the evaluation of preoperative risk assessment at the request of Dr. Caleen.  History of Present Illness: Ms. [name] has previously seen Dr. Nishan for general cardiology and Dr. Mona for lipid management. Nuclear stress test in 2017 showed moderate reversible defect, septal hypokinesis, EF 61%. However, this was personally reviewed by Dr. Francyne who felt this showed no ischemia, possible diaphragmatic attenuation instead. Notes from that admission also outline brief PSVT in setting of possible allergic reaction as well as baseline sinus bradycardia. The latter later prompted discontinuation of her atenolol , though in more recent years has been on carvedilol . Last echo 2023 showed EF 55-60%, moderate LVH, normal RV function, no significant valvular disease. In more recent years she's been followed for lipid management but has been unable to tolerate statins and Repatha . She was subsequently started on Nexletol .   She reports for the last 4-5 months she's been having increasing indigestion after eating certain foods. This started about 1x/month and slowly became a little more frequent. It was never with exertion. Getting up and walking around and taking Tums would ease the symptoms. She reports chronic mild DOE with stairs that has not changed in recent years,  no recent exertional angina. She awoke yesterday morning around 5:30am with epigastric/RUQ pain with radiation to her  back, left shoulder, and chest. She had a cheeseburger last night which she attributes as a possible culprit. She went to [facility] where troponin was negative (after about 5 hours of constant pain) and LFTs were abnormal. EKG reportedly showing NSR without acute changes. CT c/a/p showed normal aorta, mildly dilated pulmonary arteries, no central filling defects, possible emphysema, distended gallbadder with hyperenhancement, thickening and stranding. She was subsequently transferred to [facility] for concern for cholecystitis. Here, labs showed markedly elevated LFTs and abd US  concerning for acute cholecystitis. Cardiology asked to see for preoperative evaluation. She has been started on IV fluids, pain medication, and abx.  EKG has been ordered and is pending. Telemetry not available for review.   Past Medical History:  Diagnosis Date   Angio-edema 03/27/2021   Formatting of this note might be different from the original.  Last Assessment & Plan:   Formatting of this note might be different from the original.  Resolved.  Likely due to IV contrast (lisinopril  also d/c'd)     Arthritis    Bladder pain    Bradycardia    DDD (degenerative disc disease), lumbosacral    Grief 05/24/2018   Hyperlipidemia    Hyperplastic colon polyp    Hypertension    IC (interstitial cystitis)    Insomnia 04/06/2010   Qualifier: Diagnosis of  By: Jenetta MD, Talia     Internal hemorrhoids    Polyp of rectum    PONV (postoperative nausea and vomiting)    Right wrist tendonitis 08/24/2017   Squamous papilloma    in esophageal polyp   Uterine fibroid    Vertigo    Wears glasses    Wears partial dentures  Past Surgical History:  Procedure Laterality Date   CARPAL TUNNEL RELEASE Bilateral    COLONOSCOPY  11/02/2010   COLONOSCOPY W/ POLYPECTOMY  07/27/2007   CYSTO WITH HYDRODISTENSION N/A 05/20/2014   Procedure: CYSTOSCOPY/HYDRODISTENSION, INSTILLATION OF CHLORPACTIN;  Surgeon: Alm GORMAN Fragmin, MD;  Location:  [facility];  Service: Urology;  Laterality: N/A;   CYSTO/  HYDRODISTENTION/  INSTILLATION CLORPACTIC  09/21/2010   DILATION AND CURETTAGE OF UTERUS  07/26/1986   KNEE ARTHROSCOPY Right 11/18/2009   TRIGGER FINGER RELEASE Right 08/23/2016   Procedure: RIGHT RING FINGER TRIGGER RELEASE;  Surgeon: Alm Hummer, MD;  Location: [facility];  Service: Orthopedics;  Laterality: Right;   UPPER GASTROINTESTINAL ENDOSCOPY  10/17/2015     Home Medications:  Prior to Admission medications   Medication Sig Start Date End Date Taking? Authorizing Provider  amLODipine  (NORVASC ) 10 MG tablet Take 1 tablet (10 mg total) by mouth daily. 03/14/24   [name], Roselie Rockford, NP  carvedilol  (COREG ) 6.25 MG tablet Take 1 tablet (6.25 mg total) by mouth 2 (two) times daily. 02/24/24   Hilty, Vinie BROCKS, MD  Cholecalciferol (VITAMIN D3) 50 MCG (2000 UT) capsule Take 1 capsule (2,000 Units total) by mouth daily. 04/15/23   [name], Roselie Rockford, NP  ezetimibe  (ZETIA ) 10 MG tablet Take 1 tablet (10 mg total) by mouth daily. 02/24/24 05/29/24  Mona Vinie BROCKS, MD  fluocinonide -emollient (LIDEX -E) 0.05 % cream Apply 1 Application topically 2 (two) times daily as needed. 03/29/23   [name], Roselie Rockford, NP  gabapentin  (NEURONTIN ) 100 MG capsule Take 2 capsules (200 mg total) by mouth at bedtime. 03/14/24   [name], Roselie Rockford, NP  hydrALAZINE  (APRESOLINE ) 25 MG tablet Take 1 tablet (25 mg total) by mouth 2 (two) times daily. 03/14/24   [name], Roselie Rockford, NP  hydrochlorothiazide  (HYDRODIURIL ) 25 MG tablet Take 1 tablet (25 mg total) by mouth daily. 03/14/24   [name], Roselie Rockford, NP  HYDROcodone -acetaminophen  (NORCO) 7.5-325 MG tablet Take 1 tablet by mouth every 8 (eight) hours. (02/29/24) 03/30/24   [name], Roselie Rockford, NP  HYDROcodone -acetaminophen  (NORCO) 7.5-325 MG tablet Take 1 tablet by mouth every 8 (eight) hours. (02/29/24) 04/30/24   [name], Roselie Rockford, NP  HYDROcodone -acetaminophen  (NORCO) 7.5-325 MG  tablet Take 1 tablet by mouth every 8 (eight) hours. 05/31/24   [name], Roselie Rockford, NP  meclizine  (ANTIVERT ) 25 MG tablet Take 25 mg by mouth 3 (three) times daily as needed for dizziness.    [provider]  Meth-Hyo-M Bl-Na Phos-Ph Sal (URO-MP) 118 MG CAPS Take 1 capsule by mouth 3 (three) times daily as needed. 05/13/23   [provider]  traZODone  (DESYREL ) 50 MG tablet Take 1/2 tablet (25 mg total) by mouth at bedtime as needed for sleep. 11/02/23   [name], Roselie Rockford, NP    Scheduled Meds:  amLODipine   10 mg Oral Daily   carvedilol   6.25 mg Oral BID   docusate sodium   100 mg Oral QHS   enoxaparin  (LOVENOX ) injection  40 mg Subcutaneous Q24H   [START ON 03/28/2024] ezetimibe   10 mg Oral Daily   hydrALAZINE   25 mg Oral BID   HYDROcodone -acetaminophen   1 tablet Oral Q8H   Continuous Infusions:  lactated ringers  100 mL/hr at 03/26/24 2245   piperacillin -tazobactam (ZOSYN )  IV 3.375 g (03/27/24 0605)   PRN Meds: acetaminophen  **OR** acetaminophen , bisacodyl , HYDROmorphone  (DILAUDID ) injection, melatonin, ondansetron  **OR** ondansetron  (ZOFRAN ) IV, traZODone   Allergies:    Allergies  Allergen Reactions   Contrast Media [Iodinated Contrast  Media] Other (See Comments)    ? angioedema   Lisinopril  Other (See Comments)    ? angioedema   Repatha  [Evolocumab ] Other (See Comments)    Arthritis like pain   Allegra [Fexofenadine] Other (See Comments)    Makes pt nervous   Codeine Nausea And Vomiting   Losartan  Other (See Comments)    myalgia   Nsaids Other (See Comments)    stomach on fire feeling    Prednisone  Other (See Comments)    Causes elevation in blood pressure- ? All steroids cause same reaction   Pseudoephedrine Hcl Nausea And Vomiting   Sulfa Antibiotics Rash   Sulfur Rash   Zithromax  [Azithromycin ] Other (See Comments)    Burns stomach    Social History:   Social History   Socioeconomic History   Marital status: Widowed    Spouse name: Not on  file   Number of children: 6   Years of education: Not on file   Highest education level: Not on file  Occupational History   Occupation: housewife  Tobacco Use   Smoking status: Former    Current packs/day: 0.00    Types: Cigarettes    Quit date: 05/17/1971    Years since quitting: 52.8   Smokeless tobacco: Never  Vaping Use   Vaping status: Never Used  Substance and Sexual Activity   Alcohol use: No    Alcohol/week: 0.0 standard drinks of alcohol   Drug use: Yes    Types: Hydrocodone    Sexual activity: Not Currently    Birth control/protection: Post-menopausal  Other Topics Concern   Not on file  Social History Narrative   Not on file   Social Drivers of Health   Financial Resource Strain: Low Risk  (10/31/2023)   Overall Financial Resource Strain (CARDIA)    Difficulty of Paying Living Expenses: Not hard at all  Food Insecurity: No Food Insecurity (03/26/2024)   Hunger Vital Sign    Worried About Running Out of Food in the Last Year: Never true    Ran Out of Food in the Last Year: Never true  Transportation Needs: No Transportation Needs (03/26/2024)   PRAPARE - Administrator, Civil Service (Medical): No    Lack of Transportation (Non-Medical): No  Physical Activity: Inactive (10/31/2023)   Exercise Vital Sign    Days of Exercise per Week: 0 days    Minutes of Exercise per Session: 0 min  Stress: No Stress Concern Present (10/31/2023)   [employer] of Occupational Health - Occupational Stress Questionnaire    Feeling of Stress : Not at all  Social Connections: Moderately Integrated (03/26/2024)   Social Connection and Isolation Panel    Frequency of Communication with Friends and Family: More than three times a week    Frequency of Social Gatherings with Friends and Family: Twice a week    Attends Religious Services: 1 to 4 times per year    Active Member of [employer] or Organizations: Yes    Attends Banker Meetings: 1 to 4 times per year     Marital Status: Widowed  Intimate Partner Violence: Not At Risk (03/26/2024)   Humiliation, Afraid, Rape, and Kick questionnaire    Fear of Current or Ex-Partner: No    Emotionally Abused: No    Physically Abused: No    Sexually Abused: No    Family History:    Family History  Problem Relation Age of Onset   Clotting disorder Mother 40   COPD Father  Heart attack Father 20   Emphysema Father    Hypertension Sister    Hypertension Brother    Stroke Paternal Aunt    Heart disease Maternal Grandmother    Hypertension Maternal Grandmother    Heart disease Maternal Grandfather    Hypertension Maternal Grandfather    Heart disease Paternal Grandmother    Hypertension Paternal Grandmother    Heart disease Paternal Grandfather    Hypertension Paternal Grandfather    Cystic fibrosis Son    Diabetes Son    Colon polyps Son    Hypertension Son    Colon cancer Neg Hx    Esophageal cancer Neg Hx    Stomach cancer Neg Hx    Rectal cancer Neg Hx      ROS:  Please see the history of present illness.  All other ROS reviewed and negative.     Physical Exam/Data: Vitals:   03/26/24 2119 03/27/24 0131 03/27/24 0558 03/27/24 0909  BP: (!) 166/90 (!) 125/56 133/89 (!) 175/69  Pulse: 62  (!) 59 (!) 54  Resp: 18 17 17 16   Temp: 98.7 F (37.1 C) 98.1 F (36.7 C) 99 F (37.2 C) 98.4 F (36.9 C)  TempSrc: Oral Oral Oral Oral  SpO2: 96% 95% 94% 97%    Intake/Output Summary (Last 24 hours) at 03/27/2024 1235 Last data filed at 03/27/2024 1156 Gross per 24 hour  Intake 0 ml  Output 975 ml  Net -975 ml      03/14/2024    9:52 AM 12/05/2023    2:46 PM 11/02/2023    1:48 PM  Last 3 Weights  Weight (lbs) 197 lb 3.2 oz 198 lb 198 lb 6.4 oz  Weight (kg) 89.449 kg 89.812 kg 89.994 kg     There is no height or weight on file to calculate BMI.  General: Well developed, well nourished, in no acute distress. Head: Normocephalic, atraumatic, sclera non-icteric, no xanthomas, nares are without  discharge. Neck: Negative for carotid bruits. JVP not elevated. Lungs: Clear bilaterally to auscultation without wheezes, rales, or rhonchi. Breathing is unlabored. Heart: RRR S1 S2 without murmurs, rubs, or gallops.  Abdomen: Soft, non-tender, non-distended with normoactive bowel sounds. No rebound/guarding. Extremities: No clubbing or cyanosis. No edema. Distal pedal pulses are 2+ and equal bilaterally. Neuro: Alert and oriented X 3. Moves all extremities spontaneously. Psych:  Responds to questions appropriately with a normal affect.   EKG:  The EKG was personally reviewed and demonstrates:  Pending Telemetry:  Telemetry was personally reviewed and demonstrates:  N/A  Relevant CV Studies: 2d echo 02/2022    1. Left ventricular ejection fraction, by estimation, is 55 to 60%. The  left ventricle has normal function. The left ventricle has no regional  wall motion abnormalities. There is moderate concentric left ventricular  hypertrophy. Left ventricular  diastolic parameters were normal.   2. Right ventricular systolic function is normal. The right ventricular  size is normal.   3. The mitral valve is grossly normal. Trivial mitral valve  regurgitation.   4. The aortic valve is grossly normal. Aortic valve regurgitation is  trivial. No aortic stenosis is present.   5. The inferior vena cava is normal in size with greater than 50%  respiratory variability, suggesting right atrial pressure of 3 mmHg.   Comparison(s): No significant change from prior study.   Conclusion(s)/Recommendation(s): Otherwise normal echocardiogram, with  minor abnormalities described in the report.   Laboratory Data: High Sensitivity Troponin:  No results for input(s): TROPONINIHS in  the last 720 hours.   Chemistry Recent Labs  Lab 03/27/24 0422  NA 141  K 3.9  CL 103  CO2 25  GLUCOSE 99  BUN 12  CREATININE 0.73  CALCIUM  9.5  GFRNONAA >60  ANIONGAP 13    Recent Labs  Lab 03/27/24 0422   PROT 6.3*  ALBUMIN 3.9  AST 457*  ALT 486*  ALKPHOS 89  BILITOT 2.0*   Lipids No results for input(s): CHOL, TRIG, HDL, LABVLDL, LDLCALC, CHOLHDL in the last 168 hours.  Hematology Recent Labs  Lab 03/27/24 0422  WBC 9.6  RBC 4.72  HGB 13.7  HCT 41.3  MCV 87.5  MCH 29.0  MCHC 33.2  RDW 12.7  PLT 207   Thyroid  No results for input(s): TSH, FREET4 in the last 168 hours.  BNPNo results for input(s): BNP, PROBNP in the last 168 hours.  DDimer No results for input(s): DDIMER in the last 168 hours.  Radiology/Studies:  US  Abdomen Limited RUQ (LIVER/GB) Result Date: 03/26/2024 CLINICAL DATA:  Abdominal pain EXAM: ULTRASOUND ABDOMEN LIMITED RIGHT UPPER QUADRANT COMPARISON:  CT abdomen and pelvis 05/02/2023 FINDINGS: Gallbladder: Gallstones are present measuring up to 8 mm. There is a small amount of pericholecystic fluid. Gallbladder wall is thickened measuring up to 5 mm. No sonographic Murphy sign noted by sonographer. Common bile duct: Diameter: 6.6 mm. Liver: No focal lesion identified. Within normal limits in parenchymal echogenicity. Portal vein is patent on color Doppler imaging with normal direction of blood flow towards the liver. Other: None. IMPRESSION: 1. Cholelithiasis with gallbladder wall thickening and pericholecystic fluid. Findings are concerning for acute cholecystitis. 2. Common bile duct measures within normal limits for patient's age. Electronically Signed   By: Greig Pique M.D.   On: 03/26/2024 22:23     Assessment and Plan:  1. Suspected acute cholecystitis with elevated LFTs - per primary  2. Coronary calcification, preoperative risk assessment - hsTroponin negative at OSH despite 5 hours of symptoms prior to arrival, arguing against active ischemia. She also has a 4-5 month history of atypical indigestion which sounds more GI in nature, no convincing exertional angina. Labs and imaging suggest active gallbladder issue. She reports mild  stable chronic DOE which has been unchanged per her report. Last echo in 2023 showed normal EF with moderate LVH, otherwise unremarkable. I will review further with Dr. Barbaraann - also needs pre-op EKG for review, have ordered pending completion  3. History of bradycardia, brief PSVT - remotely on atenolol  which was stopped, has been on carvedilol  in recent years - will order telemetry for now, would continue perioperatively - EKG pending  4. HTN  - management per primary team  5. HLD, aortic atherosclerosis - hold Zetia  - OK to defer until OP follow-up given abnormal LFTs   Addendum: EKG has been performed, sinus bradycardia 45bpm with baseline artifact, nonspecific TW changes. Will decrease carvedilol  to 3.125mg  BID with hold parameters pending MD review. If bradycardia persists, may need top stop altogether. This has not yet crossed over into Epic. See attachment below.     Risk Assessment/Risk Scores: N/A         For questions or updates, please contact [facility] Please consult [email or web address] for contact info under    Signed, Kaydee Magel N Izaac Reisig, PA-C  03/27/2024 12:35 PM

## 2024-03-27 NOTE — Plan of Care (Signed)

## 2024-03-27 NOTE — Assessment & Plan Note (Signed)
 Continue home Norco every 8 hourly -Added oxycodone  for breakthrough pain as continue to have significant pain due to cholecystitis and cholelithiasis

## 2024-03-27 NOTE — Consult Note (Signed)
 Heather Shaw 12-Nov-1940  [id number].    Requesting MD: Dr. Amaryllis Dare Chief Complaint/Reason for Consult: Acute Cholecystitis  HPI: Heather Shaw is a 83 y.o. female with a history of chronic opioid use for chronic pain secondary to interstitial cystitis, hyperlipidemia and hypertension who presented to the ED at [facility] for abdominal pain.  Patient reports that on Sunday she woke at 5:30 AM with epigastric/RUQ abdominal pain with radiation to her back, and left shoulder.  She also did have some chest discomfort as well.  Reports associated nausea and vomiting.  No fever, constipation, diarrhea or new urinary symptoms.  She does report she has been constipated recently but her last bowel movement was yesterday and normal.  Symptoms worsened after she had a cheeseburger prompting her visit to the ED.  She reports history of similar symptoms over the last several months that usually occur after p.o. intake and self resolved.  Her workup was concerning for acute cholecystitis and she was transferred to [facility] for evaluation.  She reports no prior abdominal surgeries.  She has not any blood thinners.  She does not smoke. No alcohol use.  She lives at home with her son, does not use an assistive device for ambulation, does all of her own ADLs but does note that she has shortness of breath that stops her with ambulation when she climbs 1 flight of stairs.  She denies history of heart attack, stroke or cardiac stent placement in the past.  She is currently n.p.o. Her abdominal pain has greatly improved since admission but is still present in the epigastrium and RUQ.   ROS: ROS As above, see hpi   Family History  Problem Relation Age of Onset   Clotting disorder Mother 36   COPD Father    Heart attack Father 66   Emphysema Father    Hypertension Sister    Hypertension Brother    Stroke Paternal Aunt    Heart disease Maternal Grandmother    Hypertension Maternal Grandmother    Heart  disease Maternal Grandfather    Hypertension Maternal Grandfather    Heart disease Paternal Grandmother    Hypertension Paternal Grandmother    Heart disease Paternal Grandfather    Hypertension Paternal Grandfather    Cystic fibrosis Son    Diabetes Son    Colon polyps Son    Hypertension Son    Colon cancer Neg Hx    Esophageal cancer Neg Hx    Stomach cancer Neg Hx    Rectal cancer Neg Hx     Past Medical History:  Diagnosis Date   Angio-edema 03/27/2021   Formatting of this note might be different from the original.  Last Assessment & Plan:   Formatting of this note might be different from the original.  Resolved.  Likely due to IV contrast (lisinopril  also d/c'd)     Arthritis    Bladder pain    Bradycardia    DDD (degenerative disc disease), lumbosacral    Grief 05/24/2018   Hyperlipidemia    Hyperplastic colon polyp    Hypertension    IC (interstitial cystitis)    Insomnia 04/06/2010   Qualifier: Diagnosis of  By: Jenetta MD, Talia     Internal hemorrhoids    Polyp of rectum    PONV (postoperative nausea and vomiting)    Right wrist tendonitis 08/24/2017   Squamous papilloma    in esophageal polyp   Uterine fibroid    Vertigo  Wears glasses    Wears partial dentures     Past Surgical History:  Procedure Laterality Date   CARPAL TUNNEL RELEASE Bilateral    COLONOSCOPY  11/02/2010   COLONOSCOPY W/ POLYPECTOMY  07/27/2007   CYSTO WITH HYDRODISTENSION N/A 05/20/2014   Procedure: CYSTOSCOPY/HYDRODISTENSION, INSTILLATION OF CHLORPACTIN;  Surgeon: Alm GORMAN Fragmin, MD;  Location: [facility];  Service: Urology;  Laterality: N/A;   CYSTO/  HYDRODISTENTION/  INSTILLATION CLORPACTIC  09/21/2010   DILATION AND CURETTAGE OF UTERUS  07/26/1986   KNEE ARTHROSCOPY Right 11/18/2009   TRIGGER FINGER RELEASE Right 08/23/2016   Procedure: RIGHT RING FINGER TRIGGER RELEASE;  Surgeon: Alm Hummer, MD;  Location: [facility];  Service: Orthopedics;   Laterality: Right;   UPPER GASTROINTESTINAL ENDOSCOPY  10/17/2015    Social History:  reports that she quit smoking about 52 years ago. Her smoking use included cigarettes. She has never used smokeless tobacco. She reports current drug use. Drug: Hydrocodone . She reports that she does not drink alcohol.  Allergies:  Allergies  Allergen Reactions   Contrast Media [Iodinated Contrast Media] Other (See Comments)    ? angioedema   Lisinopril  Other (See Comments)    ? angioedema   Repatha  [Evolocumab ] Other (See Comments)    Arthritis like pain   Allegra [Fexofenadine] Other (See Comments)    Makes pt nervous   Codeine Nausea And Vomiting   Losartan  Other (See Comments)    myalgia   Nsaids Other (See Comments)    stomach on fire feeling    Prednisone  Other (See Comments)    Causes elevation in blood pressure- ? All steroids cause same reaction   Pseudoephedrine Hcl Nausea And Vomiting   Sulfa Antibiotics Rash   Sulfur Rash   Zithromax  [Azithromycin ] Other (See Comments)    Burns stomach    Facility-Administered Medications Prior to Admission  Medication Dose Route Frequency Provider Last Rate Last Admin   0.9 %  sodium chloride  infusion  500 mL Intravenous Once Pyrtle, Gordy HERO, MD       Medications Prior to Admission  Medication Sig Dispense Refill   amLODipine  (NORVASC ) 10 MG tablet Take 1 tablet (10 mg total) by mouth daily. 90 tablet 3   carvedilol  (COREG ) 6.25 MG tablet Take 1 tablet (6.25 mg total) by mouth 2 (two) times daily. 180 tablet 2   Cholecalciferol (VITAMIN D3) 50 MCG (2000 UT) capsule Take 1 capsule (2,000 Units total) by mouth daily.     ezetimibe  (ZETIA ) 10 MG tablet Take 1 tablet (10 mg total) by mouth daily. 90 tablet 2   fluocinonide -emollient (LIDEX -E) 0.05 % cream Apply 1 Application topically 2 (two) times daily as needed. 30 g 0   gabapentin  (NEURONTIN ) 100 MG capsule Take 2 capsules (200 mg total) by mouth at bedtime. 180 capsule 1   hydrALAZINE   (APRESOLINE ) 25 MG tablet Take 1 tablet (25 mg total) by mouth 2 (two) times daily. 180 tablet 3   hydrochlorothiazide  (HYDRODIURIL ) 25 MG tablet Take 1 tablet (25 mg total) by mouth daily. 90 tablet 1   [START ON 03/30/2024] HYDROcodone -acetaminophen  (NORCO) 7.5-325 MG tablet Take 1 tablet by mouth every 8 (eight) hours. (02/29/24) 90 tablet 0   [START ON 04/30/2024] HYDROcodone -acetaminophen  (NORCO) 7.5-325 MG tablet Take 1 tablet by mouth every 8 (eight) hours. (02/29/24) 90 tablet 0   [START ON 05/31/2024] HYDROcodone -acetaminophen  (NORCO) 7.5-325 MG tablet Take 1 tablet by mouth every 8 (eight) hours. 90 tablet 0   meclizine  (ANTIVERT ) 25 MG  tablet Take 25 mg by mouth 3 (three) times daily as needed for dizziness.     Meth-Hyo-M Bl-Na Phos-Ph Sal (URO-MP) 118 MG CAPS Take 1 capsule by mouth 3 (three) times daily as needed.     traZODone  (DESYREL ) 50 MG tablet Take 1/2 tablet (25 mg total) by mouth at bedtime as needed for sleep. 30 tablet 2     Physical Exam: Blood pressure (!) 175/69, pulse (!) 54, temperature 98.4 F (36.9 C), temperature source Oral, resp. rate 16, SpO2 97%. General: pleasant, WD/WN female who is laying in bed in NAD HEENT: head is normocephalic, atraumatic.  Sclera are non-icteric.  Heart: regular, rate, and rhythm.   Lungs:  Respiratory effort nonlabored Abd:  Soft, ND, epigastric and ruq ttp without rigidity or guarding. Negative Murphy's sign. No masses, hernias, or organomegaly Skin: warm and dry  Psych: A&Ox4 with an appropriate affect Neuro: normal speech, thought process intact, moves all extremities, gait not assessed  Results for orders placed or performed during the hospital encounter of 03/26/24 (from the past 48 hours)  Comprehensive metabolic panel     Status: Abnormal   Collection Time: 03/27/24  4:22 AM  Result Value Ref Range   Sodium 141 135 - 145 mmol/L   Potassium 3.9 3.5 - 5.1 mmol/L   Chloride 103 98 - 111 mmol/L   CO2 25 22 - 32 mmol/L   Glucose,  Bld 99 70 - 99 mg/dL    Comment: Glucose reference range applies only to samples taken after fasting for at least 8 hours.   BUN 12 8 - 23 mg/dL   Creatinine, Ser 9.26 0.44 - 1.00 mg/dL   Calcium  9.5 8.9 - 10.3 mg/dL   Total Protein 6.3 (L) 6.5 - 8.1 g/dL   Albumin 3.9 3.5 - 5.0 g/dL   AST 542 (H) 15 - 41 U/L   ALT 486 (H) 0 - 44 U/L   Alkaline Phosphatase 89 38 - 126 U/L   Total Bilirubin 2.0 (H) 0.0 - 1.2 mg/dL   GFR, Estimated >39 >39 mL/min    Comment: (NOTE) Calculated using the CKD-EPI Creatinine Equation (2021)    Anion gap 13 5 - 15    Comment: Performed at [facility], [address] [place], [place] [address]  CBC     Status: None   Collection Time: 03/27/24  4:22 AM  Result Value Ref Range   WBC 9.6 4.0 - 10.5 K/uL   RBC 4.72 3.87 - 5.11 MIL/uL   Hemoglobin 13.7 12.0 - 15.0 g/dL   HCT 58.6 63.9 - 53.9 %   MCV 87.5 80.0 - 100.0 fL   MCH 29.0 26.0 - 34.0 pg   MCHC 33.2 30.0 - 36.0 g/dL   RDW 87.2 88.4 - 84.4 %   Platelets 207 150 - 400 K/uL   nRBC 0.0 0.0 - 0.2 %    Comment: Performed at [facility], [address] [place], [place] [address]   US  Abdomen Limited RUQ (LIVER/GB) Result Date: 03/26/2024 CLINICAL DATA:  Abdominal pain EXAM: ULTRASOUND ABDOMEN LIMITED RIGHT UPPER QUADRANT COMPARISON:  CT abdomen and pelvis 05/02/2023 FINDINGS: Gallbladder: Gallstones are present measuring up to 8 mm. There is a small amount of pericholecystic fluid. Gallbladder wall is thickened measuring up to 5 mm. No sonographic Murphy sign noted by sonographer. Common bile duct: Diameter: 6.6 mm. Liver: No focal lesion identified. Within normal limits in parenchymal echogenicity. Portal vein is patent on color Doppler imaging with normal  direction of blood flow towards the liver. Other: None. IMPRESSION: 1. Cholelithiasis with gallbladder wall thickening and pericholecystic fluid. Findings are concerning for acute cholecystitis. 2. Common bile duct  measures within normal limits for patient's age. Electronically Signed   By: Greig Pique M.D.   On: 03/26/2024 22:23    Anti-infectives (From admission, onward)    Start     Dose/Rate Route Frequency Ordered Stop   03/26/24 2300  piperacillin -tazobactam (ZOSYN ) IVPB 3.375 g        3.375 g 12.5 mL/hr over 240 Minutes Intravenous Every 8 hours 03/26/24 2211         Assessment/Plan Acute Cholecystitis - RUQ US  w/ cholelithiasis with gallbladder wall thickening and Perry cholecystic fluid.  CBD wnl - WBC wnl. Lipase pending. Alk Phos wnl. AST 457, ALT 486, T. BIli 2.0.  - Afebrile. No tachycardia or hypotension. - Remains ttp in the epigsatrium and ruq. Negative murphy's sign.  - Discussed with TR[facility]. Agree with medical evaluation for cp and sob before general anesthesia - Agree with IV abx, cont - Trend labs  - Pending cardiac evaluation, optimization and risk stratification would recommend laparoscopic cholecystectomy - I have explained the procedure, risks, and aftercare of Laparoscopic cholecystectomy.  Risks include but are not limited to anesthesia (MI, CVA, death, prolonged intubation and aspiration), bleeding, infection, wound problems, hernia, bile leak, injury to common bile duct/liver/intestine, possible need for subtotal cholecystectomy or open cholecystectomy, increased risk of DVT/PE and diarrhea post op.  She seems to understand and agrees to proceed.  FEN - Okay for CLD, NPO midnight. IVF per TRH VTE - SCDs, okay for chem ppx from a general surgery standpoint ID - Zosyn   I reviewed nursing notes, hospitalist notes, last 24 h vitals and pain scores, last 48 h intake and output, last 24 h labs and trends, and last 24 h imaging results.   Heather Shaw, [facility] 03/27/2024, 9:18 AM Please see Amion for pager number during day hours 7:00am-4:30pm

## 2024-03-27 NOTE — Assessment & Plan Note (Signed)
 Asymptomatic and seems chronic, EKG with sinus bradycardia in mid 40s. Cardiology decreased the dose of carvedilol -will be discontinued at if bradycardia persists -Continue to monitor

## 2024-03-28 ENCOUNTER — Encounter (HOSPITAL_COMMUNITY): Payer: Self-pay | Admitting: Anesthesiology

## 2024-03-28 ENCOUNTER — Encounter (HOSPITAL_COMMUNITY): Payer: Self-pay | Admitting: Internal Medicine

## 2024-03-28 ENCOUNTER — Encounter (HOSPITAL_COMMUNITY)
Disposition: A | Discharge status: Home / Self Care | Payer: Self-pay | Source: Other Acute Inpatient Hospital | Attending: Internal Medicine

## 2024-03-28 ENCOUNTER — Ambulatory Visit (HOSPITAL_COMMUNITY): Payer: Self-pay | Admitting: Anesthesiology

## 2024-03-28 DIAGNOSIS — Z6833 Body mass index (BMI) 33.0-33.9, adult: Secondary | ICD-10-CM

## 2024-03-28 DIAGNOSIS — K8010 Calculus of gallbladder with chronic cholecystitis without obstruction: Secondary | ICD-10-CM | POA: Diagnosis not present

## 2024-03-28 DIAGNOSIS — K810 Acute cholecystitis: Secondary | ICD-10-CM | POA: Diagnosis not present

## 2024-03-28 DIAGNOSIS — K819 Cholecystitis, unspecified: Secondary | ICD-10-CM | POA: Diagnosis not present

## 2024-03-28 DIAGNOSIS — I10 Essential (primary) hypertension: Secondary | ICD-10-CM | POA: Diagnosis not present

## 2024-03-28 DIAGNOSIS — I471 Supraventricular tachycardia, unspecified: Secondary | ICD-10-CM

## 2024-03-28 HISTORY — PX: CHOLECYSTECTOMY: SHX55

## 2024-03-28 LAB — HEPATIC FUNCTION PANEL
ALT: 278 U/L — ABNORMAL HIGH (ref 0–44)
AST: 136 U/L — ABNORMAL HIGH (ref 15–41)
Albumin: 3.9 g/dL (ref 3.5–5.0)
Alkaline Phosphatase: 75 U/L (ref 38–126)
Bilirubin, Direct: 0.4 mg/dL — ABNORMAL HIGH (ref 0.0–0.2)
Indirect Bilirubin: 0.5 mg/dL (ref 0.3–0.9)
Total Bilirubin: 0.9 mg/dL (ref 0.0–1.2)
Total Protein: 6.6 g/dL (ref 6.5–8.1)

## 2024-03-28 LAB — SURGICAL PCR SCREEN
MRSA, PCR: NEGATIVE
Staphylococcus aureus: POSITIVE — AB

## 2024-03-28 LAB — LIPASE, BLOOD: Lipase: 22 U/L (ref 11–51)

## 2024-03-28 SURGERY — LAPAROSCOPIC CHOLECYSTECTOMY
Anesthesia: General

## 2024-03-28 MED ORDER — HYDROMORPHONE HCL 1 MG/ML IJ SOLN
1.0000 mg | Freq: Once | INTRAMUSCULAR | Status: AC
  Administered 2024-03-28: 1 mg via INTRAVENOUS
  Filled 2024-03-28: qty 1

## 2024-03-28 MED ORDER — ROCURONIUM BROMIDE 10 MG/ML (PF) SYRINGE
PREFILLED_SYRINGE | INTRAVENOUS | Status: DC | PRN
  Administered 2024-03-28: 15 mg via INTRAVENOUS
  Administered 2024-03-28: 40 mg via INTRAVENOUS
  Administered 2024-03-28: 10 mg via INTRAVENOUS

## 2024-03-28 MED ORDER — SODIUM CHLORIDE (PF) 0.9 % IJ SOLN
INTRAMUSCULAR | Status: AC
Start: 2024-03-28 — End: 2024-03-28
  Filled 2024-03-28: qty 10

## 2024-03-28 MED ORDER — SUCCINYLCHOLINE CHLORIDE 200 MG/10ML IV SOSY
PREFILLED_SYRINGE | INTRAVENOUS | Status: AC
  Filled 2024-03-28: qty 10

## 2024-03-28 MED ORDER — ONDANSETRON HCL 4 MG/2ML IJ SOLN
INTRAMUSCULAR | Status: AC
  Filled 2024-03-28: qty 2

## 2024-03-28 MED ORDER — LIDOCAINE HCL (PF) 2 % IJ SOLN
INTRAMUSCULAR | Status: AC
  Filled 2024-03-28: qty 5

## 2024-03-28 MED ORDER — DEXMEDETOMIDINE HCL IN NACL 80 MCG/20ML IV SOLN
INTRAVENOUS | Status: AC
  Filled 2024-03-28: qty 20

## 2024-03-28 MED ORDER — DEXMEDETOMIDINE HCL IN NACL 80 MCG/20ML IV SOLN
INTRAVENOUS | Status: DC | PRN
Start: 2024-03-28 — End: 2024-03-28
  Administered 2024-03-28: 8 ug via INTRAVENOUS

## 2024-03-28 MED ORDER — ONDANSETRON HCL 4 MG/2ML IJ SOLN: 4.0000 mg | Freq: Once | INTRAMUSCULAR | Status: DC | PRN

## 2024-03-28 MED ORDER — CHLORHEXIDINE GLUCONATE CLOTH 2 % EX PADS
6.0000 | MEDICATED_PAD | Freq: Every day | CUTANEOUS | Status: DC
  Administered 2024-03-29: 6 via TOPICAL

## 2024-03-28 MED ORDER — PROPOFOL 500 MG/50ML IV EMUL
INTRAVENOUS | Status: DC | PRN
  Administered 2024-03-28: 75 ug/kg/min via INTRAVENOUS

## 2024-03-28 MED ORDER — PROPOFOL 10 MG/ML IV BOLUS
INTRAVENOUS | Status: DC | PRN
  Administered 2024-03-28: 150 mg via INTRAVENOUS

## 2024-03-28 MED ORDER — INDOCYANINE GREEN 25 MG IV SOLR
2.5000 mg | Freq: Once | INTRAVENOUS | Status: AC
  Administered 2024-03-28: 2.5 mg via INTRAVENOUS
  Filled 2024-03-28: qty 10

## 2024-03-28 MED ORDER — LACTATED RINGERS IV SOLN: INTRAVENOUS | Status: DC

## 2024-03-28 MED ORDER — HYDROMORPHONE HCL 1 MG/ML IJ SOLN
0.2500 mg | INTRAMUSCULAR | Status: DC | PRN
  Administered 2024-03-28: 0.5 mg via INTRAVENOUS

## 2024-03-28 MED ORDER — GLYCOPYRROLATE 0.2 MG/ML IJ SOLN
INTRAMUSCULAR | Status: DC | PRN
  Administered 2024-03-28: .2 mg via INTRAVENOUS

## 2024-03-28 MED ORDER — SUGAMMADEX SODIUM 200 MG/2ML IV SOLN
INTRAVENOUS | Status: DC | PRN
Start: 2024-03-28 — End: 2024-03-28
  Administered 2024-03-28: 200 mg via INTRAVENOUS

## 2024-03-28 MED ORDER — DROPERIDOL 2.5 MG/ML IJ SOLN: 0.6250 mg | Freq: Once | INTRAMUSCULAR | Status: DC | PRN

## 2024-03-28 MED ORDER — SUCCINYLCHOLINE CHLORIDE 200 MG/10ML IV SOSY
PREFILLED_SYRINGE | INTRAVENOUS | Status: DC | PRN
  Administered 2024-03-28: 120 mg via INTRAVENOUS

## 2024-03-28 MED ORDER — DEXAMETHASONE SODIUM PHOSPHATE 10 MG/ML IJ SOLN
INTRAMUSCULAR | Status: AC
  Filled 2024-03-28: qty 1

## 2024-03-28 MED ORDER — SUGAMMADEX SODIUM 200 MG/2ML IV SOLN
INTRAVENOUS | Status: AC
  Filled 2024-03-28: qty 2

## 2024-03-28 MED ORDER — PROPOFOL 1000 MG/100ML IV EMUL
INTRAVENOUS | Status: AC
  Filled 2024-03-28: qty 100

## 2024-03-28 MED ORDER — CHLORHEXIDINE GLUCONATE 0.12 % MT SOLN
15.0000 mL | Freq: Once | OROMUCOSAL | Status: AC
  Administered 2024-03-28: 15 mL via OROMUCOSAL

## 2024-03-28 MED ORDER — HYDROMORPHONE HCL 2 MG/ML IJ SOLN
INTRAMUSCULAR | Status: AC
  Filled 2024-03-28: qty 1

## 2024-03-28 MED ORDER — PROPOFOL 10 MG/ML IV BOLUS
INTRAVENOUS | Status: AC
  Filled 2024-03-28: qty 20

## 2024-03-28 MED ORDER — ONDANSETRON HCL 4 MG/2ML IJ SOLN
INTRAMUSCULAR | Status: DC | PRN
  Administered 2024-03-28: 4 mg via INTRAVENOUS

## 2024-03-28 MED ORDER — OXYCODONE HCL 5 MG/5ML PO SOLN: 5.0000 mg | Freq: Once | ORAL | Status: DC | PRN

## 2024-03-28 MED ORDER — HYDROMORPHONE HCL 1 MG/ML IJ SOLN
INTRAMUSCULAR | Status: AC
  Filled 2024-03-28: qty 1

## 2024-03-28 MED ORDER — LIDOCAINE HCL (PF) 2 % IJ SOLN
INTRAMUSCULAR | Status: DC | PRN
  Administered 2024-03-28: 100 mg via INTRADERMAL

## 2024-03-28 MED ORDER — OXYCODONE HCL 5 MG PO TABS: 5.0000 mg | ORAL_TABLET | Freq: Once | ORAL | Status: DC | PRN

## 2024-03-28 MED ORDER — FENTANYL CITRATE (PF) 250 MCG/5ML IJ SOLN
INTRAMUSCULAR | Status: AC
  Filled 2024-03-28: qty 5

## 2024-03-28 MED ORDER — EPHEDRINE SULFATE-NACL 50-0.9 MG/10ML-% IV SOSY
PREFILLED_SYRINGE | INTRAVENOUS | Status: DC | PRN
  Administered 2024-03-28 (×2): 10 mg via INTRAVENOUS
  Administered 2024-03-28: 5 mg via INTRAVENOUS

## 2024-03-28 MED ORDER — BUPIVACAINE-EPINEPHRINE (PF) 0.25% -1:200000 IJ SOLN
INTRAMUSCULAR | Status: AC
Start: 2024-03-28 — End: 2024-03-28
  Filled 2024-03-28: qty 30

## 2024-03-28 MED ORDER — ROCURONIUM BROMIDE 10 MG/ML (PF) SYRINGE
PREFILLED_SYRINGE | INTRAVENOUS | Status: AC
  Filled 2024-03-28: qty 10

## 2024-03-28 MED ORDER — GLYCOPYRROLATE 0.2 MG/ML IJ SOLN
INTRAMUSCULAR | Status: AC
  Filled 2024-03-28: qty 1

## 2024-03-28 MED ORDER — EPHEDRINE 5 MG/ML INJ
INTRAVENOUS | Status: AC
  Filled 2024-03-28: qty 5

## 2024-03-28 MED ORDER — 0.9 % SODIUM CHLORIDE (POUR BTL) OPTIME
TOPICAL | Status: DC | PRN
  Administered 2024-03-28: 1000 mL

## 2024-03-28 MED ORDER — PHENYLEPHRINE HCL-NACL 20-0.9 MG/250ML-% IV SOLN
INTRAVENOUS | Status: DC | PRN
  Administered 2024-03-28: 40 ug/min via INTRAVENOUS

## 2024-03-28 MED ORDER — FENTANYL CITRATE (PF) 250 MCG/5ML IJ SOLN
INTRAMUSCULAR | Status: DC | PRN
  Administered 2024-03-28: 50 ug via INTRAVENOUS
  Administered 2024-03-28: 100 ug via INTRAVENOUS
  Administered 2024-03-28: 50 ug via INTRAVENOUS

## 2024-03-28 MED ORDER — MUPIROCIN 2 % EX OINT
1.0000 | TOPICAL_OINTMENT | Freq: Two times a day (BID) | CUTANEOUS | Status: DC
  Administered 2024-03-28 – 2024-03-29 (×2): 1 via NASAL
  Filled 2024-03-28: qty 22

## 2024-03-28 MED ORDER — HYDROMORPHONE HCL 1 MG/ML IJ SOLN
INTRAMUSCULAR | Status: DC | PRN
  Administered 2024-03-28: .2 mg via INTRAVENOUS

## 2024-03-28 MED ORDER — BUPIVACAINE-EPINEPHRINE 0.25% -1:200000 IJ SOLN
INTRAMUSCULAR | Status: DC | PRN
  Administered 2024-03-28: 26 mL

## 2024-03-28 MED ORDER — LACTATED RINGERS IR SOLN
Status: DC | PRN
  Administered 2024-03-28: 1000 mL

## 2024-03-28 SURGICAL SUPPLY — 36 items
APPLICATOR ARISTA FLEXITIP XL (MISCELLANEOUS) IMPLANT
BAG COUNTER SPONGE SURGICOUNT (BAG) IMPLANT
CABLE HIGH FREQUENCY MONO STRZ (ELECTRODE) ×2 IMPLANT
CHLORAPREP W/TINT 26 (MISCELLANEOUS) ×2 IMPLANT
CLIP APPLIE 5 13 M/L LIGAMAX5 (MISCELLANEOUS) ×2 IMPLANT
CLIP APPLIE ROT 10 11.4 M/L (STAPLE) IMPLANT
COVER MAYO STAND XLG (MISCELLANEOUS) ×2 IMPLANT
COVER SURGICAL LIGHT HANDLE (MISCELLANEOUS) ×2 IMPLANT
DERMABOND ADVANCED .7 DNX12 (GAUZE/BANDAGES/DRESSINGS) ×2 IMPLANT
DISSECTOR BLUNT TIP ENDO 5MM (MISCELLANEOUS) IMPLANT
DRAPE C-ARM 42X120 X-RAY (DRAPES) IMPLANT
ELECT PENCIL ROCKER SW 15FT (MISCELLANEOUS) ×2 IMPLANT
ELECT REM PT RETURN 15FT ADLT (MISCELLANEOUS) ×2 IMPLANT
GLOVE BIO SURGEON STRL SZ7.5 (GLOVE) ×2 IMPLANT
GLOVE INDICATOR 8.0 STRL GRN (GLOVE) ×2 IMPLANT
GOWN STRL REUS W/ TWL XL LVL3 (GOWN DISPOSABLE) ×4 IMPLANT
GRASPER SUT TROCAR 14GX15 (MISCELLANEOUS) IMPLANT
HEMOSTAT ARISTA ABSORB 3G PWDR (HEMOSTASIS) IMPLANT
HEMOSTAT SNOW SURGICEL 2X4 (HEMOSTASIS) IMPLANT
IRRIGATION SUCT STRKRFLW 2 WTP (MISCELLANEOUS) ×2 IMPLANT
KIT BASIN OR (CUSTOM PROCEDURE TRAY) ×2 IMPLANT
KIT TURNOVER KIT A (KITS) ×2 IMPLANT
NDL INSUFFLATION 14GA 120MM (NEEDLE) IMPLANT
NEEDLE INSUFFLATION 14GA 120MM (NEEDLE) IMPLANT
SCISSORS LAP 5X35 DISP (ENDOMECHANICALS) ×2 IMPLANT
SET CHOLANGIOGRAPH MIX (MISCELLANEOUS) IMPLANT
SET TUBE SMOKE EVAC HIGH FLOW (TUBING) ×2 IMPLANT
SLEEVE ADV FIXATION 5X100MM (TROCAR) ×4 IMPLANT
SPIKE FLUID TRANSFER (MISCELLANEOUS) ×2 IMPLANT
SUT MNCRL AB 4-0 PS2 18 (SUTURE) ×2 IMPLANT
SYR 20ML ECCENTRIC (SYRINGE) ×2 IMPLANT
SYSTEM BAG RETRIEVAL 10MM (BASKET) ×2 IMPLANT
TOWEL OR 17X26 10 PK STRL BLUE (TOWEL DISPOSABLE) ×2 IMPLANT
TRAY LAPAROSCOPIC (CUSTOM PROCEDURE TRAY) ×2 IMPLANT
TROCAR ADV FIXATION 5X100MM (TROCAR) ×2 IMPLANT
TROCAR BALLN 12MMX100 BLUNT (TROCAR) ×2 IMPLANT

## 2024-03-28 NOTE — Anesthesia Procedure Notes (Signed)
 Procedure Name: Intubation Date/Time: 03/28/2024 8:30 AM  Performed by: Augusta Daved SAILOR, CRNAPre-anesthesia Checklist: Patient identified, Emergency Drugs available, Suction available and Patient being monitored Patient Re-evaluated:Patient Re-evaluated prior to induction Oxygen Delivery Method: Circle System Utilized Preoxygenation: Pre-oxygenation with 100% oxygen Induction Type: IV induction, Cricoid Pressure applied and Rapid sequence Laryngoscope Size: Miller and 2 Grade View: Grade II Tube type: Oral Tube size: 7.0 mm Number of attempts: 1 Airway Equipment and Method: Stylet and Oral airway Placement Confirmation: ETT inserted through vocal cords under direct vision, positive ETCO2 and breath sounds checked- equal and bilateral Secured at: 21 (at the lip) cm Tube secured with: Tape Dental Injury: Teeth and Oropharynx as per pre-operative assessment

## 2024-03-28 NOTE — Discharge Instructions (Signed)

## 2024-03-28 NOTE — Op Note (Signed)
 03/28/2024 9:35 AM  PATIENT: Heather Shaw  83 y.o. female  Patient Care Team: [name], Roselie Rockford, NP as PCP - General (Internal Medicine) Delford Maude BROCKS, MD as PCP - Cardiology (Cardiology) Alline Lenis, MD (Inactive) as Consulting Physician (Urology) Rosalynn LELON Ingle, MD (Inactive) as Consulting Physician (Obstetrics and Gynecology) Abigail Maude POUR as Consulting Physician (Optometry) Livingston Rigg, MD as Consulting Physician (Dermatology) Ramonita Suzen CROME, RN as VBCI Care Management  PRE-OPERATIVE DIAGNOSIS: Acute cholecystitis, possible choledocholithiasis  POST-OPERATIVE DIAGNOSIS: Same  PROCEDURE: Laparoscopic cholecystectomy with indocyanine green  cholangiography  SURGEON: Lonni Pizza, MD  ASSISTANT: OR Staff  ANESTHESIA: General endotracheal  EBL: 10 mL  DRAINS: None  SPECIMEN: Gallbladder  COUNTS: Sponge, needle and instrument counts were reported correct x2 at the conclusion of the operation  DISPOSITION: PACU in satisfactory condition  COMPLICATIONS: None  FINDINGS: Mildly inflamed gallbladder.  Significant contrast allergy and therefore IOC was not carried out.  ICG cholangiography does however demonstrate uptake by the liver and excretion into the biliary system with filling of the gallbladder.  There is also faint tracer seen through the wall of the duodenum consistent with a patent biliary system.  Critical view of safety achieved prior to clipping or dividing any structures.  DESCRIPTION:  The patient was seen in the pre-op holding area. The risks, benefits, complications, treatment options, and expected outcomes were previously discussed with the patient. The patient agreed with the proposed plan and has signed the informed consent form. The patient was brought to the operating room by the surgical team, identified as [facility], and the procedure verified. placed supine on the operating table and SCD's were applied. General anesthesia was  induced without difficulty. She was positioned supine on the operating table.  Pressure points were evaluated and padded.A foley catheter was then placed by nursing under sterile conditions. Hair on the abdomen was clipped.  She was secured to the operating table. The abdomen was then prepped and draped in the standard sterile fashion. A time out was completed and the above information confirmed and need for preoperative antibiotics.  A periumbilical incision was made. The umbilical stalk was grasped and retracted outwardly. The supraumbilical fascia was identified and incised. The peritoneal cavity was gently entered bluntly. A purse-string 0 Vicryl suture was placed. The Hasson cannula was inserted into the peritoneal cavity and insufflation with CO2 commenced to . A laparoscope was inserted into the peritoneal cavity and inspection confirmed no evidence of trocar site complications. The patient was then positioned in reverse Trendelenburg with slight left side down. 3 additional 5mm trocars were placed along the right subcostal line - one 5mm port in mid subcostal region, another 5mm port in the right flank near the anterior axillary line, and a third 5mm port in the left subxiphoid region obliquely near the falciform ligament.  The liver and gallbladder were inspected. . The gallbladder fundus was grasped and elevated cephalad. An additional grasper was then placed on the infundibulum of the gallbladder and the infundibulum was retracted laterally. Staying high on the gallbladder, the peritoneum on both sides of the gallbladder was opened with hook cautery. Gentle blunt dissection was then employed with a [place]  dissector working down into [employer]. The cystic duct was identified and carefully circumferentially dissected. The cystic artery was also identified and carefully circumferentially dissected. The space between the cystic artery and hepatocystic plate was developed such that a good  view of the liver could be seen through a window medial to the  cystic artery. The triangle of Calot had been cleared of all fibrofatty tissue. At this point, a critical view of safety was achieved and the only structures visualized was the skeletonized cystic duct laterally, the skeletonized cystic artery and the liver through the window medial to the artery.  Additionally, a posterior cystic artery was noted.  Given her contrast allergy, we did forego intraoperative cholangiogram but were able to use ICG dye.  Indocyanine green  cholangiography demonstrates uptake by the liver and excretion into the biliary system.  There is contrast seen in the gallbladder as well as extending through the cystic duct.  There is no opacification of the cystic artery at its anterior or posterior divisions.  There is faint tracer seen to the wall of the duodenum which suggests a patent biliary system.  The cystic duct and artery were clipped with 2 clips on the patient side and 1 clip on the specimen side.  1 additional clip was placed on the staying side of the cystic duct.  The cystic artery at its posterior division was controlled similarly.  The cystic duct and artery were then divided. The gallbladder was then freed from its remaining attachments to the liver using electrocautery and placed into an endocatch bag.  There was some spillage of bile during the removal from the liver bed related to the relatively thin walled nature of the gallbladder wall on the hepatocystic plate.  The RUQ was gently irrigated with sterile saline. Hemostasis was then verified. The clips were in good position; the gallbladder fossa was dry. The rest of the abdomen was inspected no injury nor bleeding elsewhere was identified.  The endocatch bag containing the gallbladder was then removed from the umbilical port site and passed off as specimen. The RUQ ports were removed under direct visualization and noted to be hemostatic. The umbilical fascia  was then closed using the 0 Vicryl purse-string suture. The fascia was palpated and noted to be completely closed. The skin of all incision sites was approximated with 4-0 monocryl subcuticular suture and dermabond applied. She was then awakened from anesthesia, extubated, and transferred to a stretcher for transport to PACU in satisfactory condition.

## 2024-03-28 NOTE — Progress Notes (Signed)
Pt to OR for surgery 

## 2024-03-28 NOTE — Transfer of Care (Signed)
 Immediate Anesthesia Transfer of Care Note  Patient: Heather Shaw  Procedure(s) Performed: LAPAROSCOPIC CHOLECYSTECTOMY  Patient Location: PACU  Anesthesia Type:General  Level of Consciousness: drowsy and patient cooperative  Airway & Oxygen Therapy: Patient Spontanous Breathing and Patient connected to face mask oxygen  Post-op Assessment: Report given to RN and Post -op Vital signs reviewed and stable  Post vital signs: Reviewed and stable  Last Vitals:  Vitals Value Taken Time  BP 125/60 03/28/24 09:45  Temp    Pulse 63 03/28/24 09:46  Resp 12 03/28/24 09:46  SpO2 97 % 03/28/24 09:46  Vitals shown include unfiled device data.  Last Pain:  Vitals:   03/28/24 0807  TempSrc:   PainSc: Asleep         Complications: No notable events documented.

## 2024-03-28 NOTE — Progress Notes (Signed)
  Progress Note   Patient: Heather Shaw FMW:[id number] DOB: Jul 25, 1941 DOA: 03/26/2024     2 DOS: the patient was seen and examined on 03/28/2024   Brief hospital course:   Lynnette Pote Prest is a 83 y.o. female with medical history significant for chronic opioid use for chronic pain due to interstitial cystitis, hyperlipidemia and statin intolerance, hypertension, and a growing left ovarian cyst presents after being awakened at 530 this morning with severe burning indigestion and abdominal pain.  Pain was radiating to the shoulder and back.  Patient went to ER at [facility], CT abdomen and pelvis with gallbladder wall thickening, distended gallbladder and pericholecystic stranding concerning for cholecystitis, patient decided to come to [facility] after that.  Patient was admitted for concern of cholecystitis, surgery was consulted and she was started on Zosyn .  Patient went to surgery on 9/3.  Assessment and Plan: * Cholecystitis Imaging concerning for cholelithiasis and cholecystitis. General surgery was consulted-surgery on 9/3 - Continue with Zosyn  -Continue supportive care  Bradycardia Asymptomatic and seems chronic, EKG with sinus bradycardia in mid 40s. Cardiology decreased the dose of carvedilol -will be discontinued at if bradycardia persists -Continue to monitor  Chronic pain Continue home Norco every 8 hourly -Added oxycodone  for breakthrough pain as continue to have significant pain due to cholecystitis and cholelithiasis  Dyslipidemia - Continue Zetia  -Apparently has refused to take statin.  Ovarian cyst Patient also has an history of uterine fibroids. CT with concern of left ovarian cyst. - Outpatient follow-up with gynecology   Subjective:  In surgery  Physical Exam: Vitals:   03/28/24 1045 03/28/24 1100 03/28/24 1115 03/28/24 1205  BP: (!) 140/63 (!) 142/61 (!) 151/66 (!) 170/76  Pulse: (!) 58 (!) 56 (!) 53 67  Resp: 11 13 10    Temp:       TempSrc:      SpO2: 97% 97% 99% 98%  Weight:      Height:       NAD, status post surgery   Data Reviewed: Prior data reviewed  Family Communication:   Disposition: Status is: Inpatient Remains inpatient appropriate because: Severity of illness, likely need cholecystectomy during current hospitalization.  Planned Discharge Destination: Home  DVT prophylaxis.  Lovenox  Time spent: 50 minutes  This record has been created using Conservation officer, historic buildings. Errors have been sought and corrected,but may not always be located. Such creation errors do not reflect on the standard of care.   Author: Mialani Reicks U Taner Rzepka, DO 03/28/2024 1:14 PM  For on call review [email or web address].

## 2024-03-28 NOTE — Care Management CC44 (Signed)
 Condition Code 44 Documentation Completed  Patient Details  Name: Yu Cragun MRN: [id number] Date of Birth: 08-14-1940   Condition Code 44 given:  Yes Patient signature on Condition Code 44 notice:  Yes Documentation of 2 MD's agreement:  Yes Code 44 added to claim:  Yes    Keundra Petrucelli, LCSW 03/28/2024, 1:21 PM

## 2024-03-28 NOTE — Anesthesia Postprocedure Evaluation (Signed)
 Anesthesia Post Note  Patient: Heather Shaw  Procedure(s) Performed: LAPAROSCOPIC CHOLECYSTECTOMY     Patient location during evaluation: PACU Anesthesia Type: General Level of consciousness: awake and alert and oriented Pain management: pain level controlled Vital Signs Assessment: post-procedure vital signs reviewed and stable Respiratory status: spontaneous breathing, nonlabored ventilation and respiratory function stable Cardiovascular status: blood pressure returned to baseline and stable Postop Assessment: no apparent nausea or vomiting Anesthetic complications: no   No notable events documented.  Last Vitals:  Vitals:   03/28/24 1000 03/28/24 1015  BP: 109/74 134/65  Pulse: (!) 56 (!) 58  Resp: 11 12  Temp:    SpO2: 99% 92%    Last Pain:  Vitals:   03/28/24 1028  TempSrc:   PainSc: 6                  Cort Dragoo A.

## 2024-03-28 NOTE — Progress Notes (Signed)
 Subjective No acute events. Still with intermittent RUQ pain although improved since admission. No n/v.  Objective: Vital signs in last 24 hours: Temp:  [97.6 F (36.4 C)-98.4 F (36.9 C)] 98.1 F (36.7 C) (09/03 0623) Pulse Rate:  [54-65] 55 (09/03 0623) Resp:  [14-18] 18 (09/03 0623) BP: (169-202)/(63-93) 202/77 (09/03 0623) SpO2:  [96 %-97 %] 96 % (09/03 0623) Weight:  [89.4 kg] 89.4 kg (09/03 0632) Last BM Date : 03/25/24  Intake/Output from previous day: 09/02 0701 - 09/03 0700 In: 495.6 [I.V.:331.9; IV Piggyback:163.7] Out: 1675 [Urine:1675] Intake/Output this shift: No intake/output data recorded.  Gen: NAD, comfortable CV: RRR Pulm: Normal work of breathing Abd: Soft, minimal tenderness at this point, nondistended Ext: SCDs in place  Lab Results: CBC  Recent Labs    03/27/24 0422  WBC 9.6  HGB 13.7  HCT 41.3  PLT 207   BMET Recent Labs    03/27/24 0422  NA 141  K 3.9  CL 103  CO2 25  GLUCOSE 99  BUN 12  CREATININE 0.73  CALCIUM  9.5   PT/INR No results for input(s): LABPROT, INR in the last 72 hours. ABG No results for input(s): PHART, HCO3 in the last 72 hours.  Invalid input(s): PCO2, PO2  Studies/Results:  Anti-infectives: Anti-infectives (From admission, onward)    Start     Dose/Rate Route Frequency Ordered Stop   03/26/24 2300  [MAR Hold]  piperacillin -tazobactam (ZOSYN ) IVPB 3.375 g        (MAR Hold since Wed 03/28/2024 at 0623.Hold Reason: Transfer to a Procedural area)   3.375 g 12.5 mL/hr over 240 Minutes Intravenous Every 8 hours 03/26/24 2211          Assessment/Plan: Patient Active Problem List   Diagnosis Date Noted   Ovarian cyst 03/27/2024   Lactic acidosis 03/27/2024   Cholecystitis 03/26/2024   Obesity, morbid (HCC) 11/02/2023   Constipation 10/25/2023   Controlled substance agreement signed 08/05/2023   Insomnia 12/09/2022   Statin myopathy 09/06/2022   Hyperglycemia 09/25/2020   Atherosclerosis  of aorta (HCC) 09/25/2020   Opioid type dependence, continuous (HCC) 08/15/2019   Chronic pain 08/11/2017   Seborrheic keratoses, inflamed 12/24/2016   Hearing loss 11/23/2016   Screening for colon cancer 11/05/2016   SVT (supraventricular tachycardia) (HCC)    Bradycardia 12/27/2015   Carpal tunnel syndrome 09/19/2014   Eczema 09/19/2014   Chronic interstitial cystitis 05/20/2014   Lumbar radiculopathy, chronic 12/31/2013   Degeneration of lumbosacral intervertebral disc 04/07/2011   Vitamin D  deficiency 10/03/2009   Dyslipidemia 07/16/2009   Essential hypertension 07/16/2009    Acute cholecystitis, possible choledocholithiasis  Cardiac clearance obtained Lipase normal  Will plan laparoscopic cholecystectomy with ICG, possible cholangiogram based on findings  -The anatomy and physiology of the hepatobiliary system was discussed with her again today. The pathophysiology of gallbladder disease was then reviewed as well. -The options for treatment were discussed including ongoing observation which may result in subsequent gallbladder complications (infection, pancreatitis, choledocholithiasis, etc), drainage procedures, and surgery - laparoscopic cholecystectomy  -The planned procedure, material risks (including, but not limited to, pain, bleeding, infection, scarring, need for blood transfusion, damage to surrounding structures- blood vessels/nerves/viscus/organs, damage to bile duct, bile leak, chronic diarrhea, conversion to a 'subtotal' cholecystectomy and general expectations therein, post-cholecystectomy diarrhea, potential need for additional procedures including EGD/ERCP, blood clot, pulmonary embolus, hernia, worsening of pre-existing medical conditions, pancreatitis, pneumonia, heart attack, stroke, death) benefits and alternatives to surgery were discussed at length. We have noted a good  probability that the procedure would help improve their symptoms. The patient's questions  were answered to her satisfaction, she voiced understanding and elected to proceed with surgery. Additionally, we discussed typical postoperative expectations and the recovery process.   LOS: 2 days   I spent a total of 50 minutes in both face-to-face and non-face-to-face activities, excluding procedures performed, for this visit on the date of this encounter.   Lonni Pizza, MD [facility], A [facility]

## 2024-03-28 NOTE — Care Management Obs Status (Signed)
 MEDICARE OBSERVATION STATUS NOTIFICATION   Patient Details  Name: Heather Shaw MRN: [id number] Date of Birth: [birth year]   Medicare Observation Status Notification Given:  Yes    NORMAN ASPEN, LCSW 03/28/2024, 1:21 PM

## 2024-03-28 NOTE — Plan of Care (Signed)

## 2024-03-28 NOTE — Progress Notes (Signed)
   03/28/24 1325  TOC Brief Assessment  Insurance and Status Reviewed  Patient has primary care physician Yes  Home environment has been reviewed home with family  Prior level of function: independent  Prior/Current Home Services No current home services  Social Drivers of Health Review SDOH reviewed no interventions necessary  Readmission risk has been reviewed Yes  Transition of care needs no transition of care needs at this time

## 2024-03-29 ENCOUNTER — Other Ambulatory Visit (HOSPITAL_COMMUNITY): Payer: Self-pay

## 2024-03-29 ENCOUNTER — Encounter (HOSPITAL_COMMUNITY): Payer: Self-pay | Admitting: Surgery

## 2024-03-29 ENCOUNTER — Encounter: Payer: Self-pay | Admitting: Nurse Practitioner

## 2024-03-29 DIAGNOSIS — K819 Cholecystitis, unspecified: Secondary | ICD-10-CM | POA: Diagnosis not present

## 2024-03-29 DIAGNOSIS — K8010 Calculus of gallbladder with chronic cholecystitis without obstruction: Secondary | ICD-10-CM | POA: Diagnosis not present

## 2024-03-29 DIAGNOSIS — G8929 Other chronic pain: Secondary | ICD-10-CM

## 2024-03-29 DIAGNOSIS — R2689 Other abnormalities of gait and mobility: Secondary | ICD-10-CM

## 2024-03-29 DIAGNOSIS — Z9181 History of falling: Secondary | ICD-10-CM

## 2024-03-29 LAB — COMPREHENSIVE METABOLIC PANEL WITH GFR
ALT: 211 U/L — ABNORMAL HIGH (ref 0–44)
AST: 74 U/L — ABNORMAL HIGH (ref 15–41)
Albumin: 3.9 g/dL (ref 3.5–5.0)
Alkaline Phosphatase: 73 U/L (ref 38–126)
Anion gap: 14 (ref 5–15)
BUN: 10 mg/dL (ref 8–23)
CO2: 23 mmol/L (ref 22–32)
Calcium: 9.5 mg/dL (ref 8.9–10.3)
Chloride: 103 mmol/L (ref 98–111)
Creatinine, Ser: 0.62 mg/dL (ref 0.44–1.00)
GFR, Estimated: >60 mL/min (ref >60)
Glucose, Bld: 94 mg/dL (ref 70–99)
Potassium: 3.6 mmol/L (ref 3.5–5.1)
Sodium: 140 mmol/L (ref 135–145)
Total Bilirubin: 0.7 mg/dL (ref 0.0–1.2)
Total Protein: 6.9 g/dL (ref 6.5–8.1)

## 2024-03-29 LAB — CBC
HCT: 43.7 % (ref 36.0–46.0)
Hemoglobin: 14.4 g/dL (ref 12.0–15.0)
MCH: 29.1 pg (ref 26.0–34.0)
MCHC: 33 g/dL (ref 30.0–36.0)
MCV: 88.5 fL (ref 80.0–100.0)
Platelets: 223 K/uL (ref 150–400)
RBC: 4.94 MIL/uL (ref 3.87–5.11)
RDW: 12.7 % (ref 11.5–15.5)
WBC: 8.8 K/uL (ref 4.0–10.5)
nRBC: 0 % (ref 0.0–0.2)

## 2024-03-29 LAB — SURGICAL PATHOLOGY

## 2024-03-29 MED ORDER — HYDROCODONE-ACETAMINOPHEN 7.5-325 MG PO TABS
1.0000 | ORAL_TABLET | Freq: Three times a day (TID) | ORAL | 0 refills | Status: DC
  Filled 2024-03-29: qty 12, 4d supply, fill #0

## 2024-03-29 MED ORDER — HYDROCODONE-ACETAMINOPHEN 7.5-325 MG PO TABS
1.0000 | ORAL_TABLET | Freq: Three times a day (TID) | ORAL | 0 refills | Status: DC
  Filled ????-??-??: fill #0

## 2024-03-29 MED ORDER — BISACODYL 5 MG PO TBEC: 5.0000 mg | DELAYED_RELEASE_TABLET | Freq: Every day | ORAL | Status: AC | PRN

## 2024-03-29 NOTE — Progress Notes (Signed)
Patient discharged home, IV removed, discharge paperwork provided and explained to patient as well as patient's daughter, both patient and patient's daughter verbalized understanding.

## 2024-03-29 NOTE — Progress Notes (Signed)
 1 Day Post-Op  Subjective: CC: Reports soreness around her incisions that is worse with movement and well controlled. Tolerating regular diet without n/v. Passing flatus. Voiding. Mobilizing. Up to the chair this morning. LFT's downtrending.   Objective: Vital signs in last 24 hours: Temp:  [97.7 F (36.5 C)-98.3 F (36.8 C)] 97.7 F (36.5 C) (09/04 0630) Pulse Rate:  [49-72] 61 (09/04 0630) Resp:  [10-18] 18 (09/04 0630) BP: (109-170)/(51-77) 125/51 (09/04 0630) SpO2:  [92 %-100 %] 97 % (09/04 0630) Last BM Date : 03/25/24  Intake/Output from previous day: 09/03 0701 - 09/04 0700 In: 1880 [P.O.:1080; I.V.:800] Out: 1765 [Urine:1750; Blood:15] Intake/Output this shift: No intake/output data recorded.  PE: Gen:  Alert, NAD, pleasant Abd: Soft, mild distension, appropriately tender around laparoscopic incisions, no rigidity or guarding and otherwise NT, +BS. Incisions with glue intact appears well and are without drainage, bleeding, or signs of infection.   Lab Results:  Recent Labs    03/27/24 0422 03/29/24 0454  WBC 9.6 8.8  HGB 13.7 14.4  HCT 41.3 43.7  PLT 207 223   BMET Recent Labs    03/27/24 0422 03/29/24 0454  NA 141 140  K 3.9 3.6  CL 103 103  CO2 25 23  GLUCOSE 99 94  BUN 12 10  CREATININE 0.73 0.62  CALCIUM  9.5 9.5   PT/INR No results for input(s): LABPROT, INR in the last 72 hours. CMP     Component Value Date/Time   NA 140 03/29/2024 0454   NA 133 (L) 10/10/2012 2107   K 3.6 03/29/2024 0454   K 4.5 10/10/2012 2107   CL 103 03/29/2024 0454   CL 97 (L) 10/10/2012 2107   CO2 23 03/29/2024 0454   CO2 29 10/10/2012 2107   GLUCOSE 94 03/29/2024 0454   GLUCOSE 109 (H) 10/10/2012 2107   BUN 10 03/29/2024 0454   BUN 13 10/10/2012 2107   CREATININE 0.62 03/29/2024 0454   CREATININE 0.74 10/10/2012 2107   CALCIUM  9.5 03/29/2024 0454   CALCIUM  8.8 10/10/2012 2107   PROT 6.9 03/29/2024 0454   PROT 7.6 10/10/2012 2107   ALBUMIN 3.9  03/29/2024 0454   ALBUMIN 3.7 10/10/2012 2107   AST 74 (H) 03/29/2024 0454   AST 18 10/10/2012 2107   ALT 211 (H) 03/29/2024 0454   ALT 23 10/10/2012 2107   ALKPHOS 73 03/29/2024 0454   ALKPHOS 52 10/10/2012 2107   BILITOT 0.7 03/29/2024 0454   BILITOT 0.3 10/10/2012 2107   GFRNONAA >60 03/29/2024 0454   GFRNONAA >60 10/10/2012 2107   GFRAA >60 01/25/2020 1723   GFRAA >60 10/10/2012 2107   Lipase     Component Value Date/Time   LIPASE 22 03/28/2024 0443    Studies/Results: No results found.  Anti-infectives: Anti-infectives (From admission, onward)    Start     Dose/Rate Route Frequency Ordered Stop   03/26/24 2300  piperacillin -tazobactam (ZOSYN ) IVPB 3.375 g  Status:  Discontinued        3.375 g 12.5 mL/hr over 240 Minutes Intravenous Every 8 hours 03/26/24 2211 03/28/24 1216        Assessment/Plan POD 1 s/p laparoscopic cholecystectomy by Dr. Teresa on 03/28/24 for acute cholecystitis - LFT's downtrending. Normal Alk Phos and T. Bili today - HH diet as tolerated - Mobilize - Okay for discharge from our standpoint. Will arrange follow up. Discussed discharge instructions, restrictions and return/call back precautions. Will send a message to [facility].   FEN - HH, IVF per  [facility] VTE - SCDs, Lovenox  ID - Zosyn  peri-op. No further abx needed from our standpoint.     LOS: 2 days    Ozell CHRISTELLA Shaper, [facility] 03/29/2024, 8:14 AM Please see Amion for pager number during day hours 7:00am-4:30pm

## 2024-03-29 NOTE — Progress Notes (Signed)
Discharge medication delivered to patient at bedside.

## 2024-03-29 NOTE — Discharge Summary (Addendum)
 Physician Discharge Summary  Heather Shaw DOB: Nov 27, 1940 DOA: 03/26/2024  PCP: Katheen Roselie Rockford, NP  Admit date: 03/26/2024 Discharge date: 03/29/2024  Admitted From:  Discharge disposition: Home   Recommendations for Outpatient Follow-Up:   Close outpatient follow-up Monitor heart rate Patient given 3 days of narcotics post surgery as her script is due to be refilled on 9/6   Discharge Diagnosis:   Principal Problem:   Cholecystitis Active Problems:   Bradycardia   Chronic pain   Opioid type dependence, continuous (HCC)   Essential hypertension   Dyslipidemia   Ovarian cyst   SVT (supraventricular tachycardia) (HCC)    Discharge Condition: Improved.  Diet recommendation: Soft  Wound care: None.  Code status: Full.   History of Present Illness:    Heather Shaw is a 83 y.o. female with medical history significant for chronic opioid use for chronic pain due to interstitial cystitis, hyperlipidemia and statin intolerance, hypertension, and a growing left ovarian cyst presents after being awakened at 530 this morning with severe burning indigestion and abdominal pain.  The patient has had more trouble with indigestion over the last couple of months but nothing like the pain she had today.  The pain radiated into her left shoulder and back that is when she decided to go to the emergency department.  She also reports that her abdomen became distended and tight like a basketball.  The burning was all the way from her upper mid chest down through her abdomen.  She denies any fevers or chills.  She has not had any vomiting.  She takes chronic narcotics and has had some trouble with constipation in the past but denies feeling that way recently. Her closest emergency department is [facility] so she went there this morning.  Her evaluation there included a CT scan of her abdomen pelvis which revealed gallbladder wall thickening a distended  gallbladder and  pericholecystic stranding.  The patient elected to come to [facility] because she appreciates this hospital from when her husband was ill and spent a lot of time here.  Her daughters live in [place].     Hospital Course by Problem:   POD 1 s/p laparoscopic cholecystectomy by Dr. Teresa on 03/28/24 for acute cholecystitis - LFT's downtrending. Normal Alk Phos and T. Bili today - HH diet as tolerated - Mobilize - Okay for discharge from our standpoint.  Chronic pain Continue home Norco every 8 hourly -Added oxycodone  for breakthrough pain as continue to have significant pain due to cholecystitis and cholelithiasis   Dyslipidemia - Continue Zetia  -Apparently has refused to take statin.   Ovarian cyst Patient also has an history of uterine fibroids. CT with concern of left ovarian cyst. - Outpatient follow-up with gynecology  Medical Consultants:   General Surgery   Discharge Exam:   Vitals:   03/29/24 0201 03/29/24 0630  BP: (!) 128/54 (!) 125/51  Pulse: (!) 49 61  Resp: 18 18  Temp: 98.2 F (36.8 C) 97.7 F (36.5 C)  SpO2: 93% 97%   Vitals:   03/28/24 1644 03/28/24 2049 03/29/24 0201 03/29/24 0630  BP: (!) 155/60 (!) 144/58 (!) 128/54 (!) 125/51  Pulse: 72 66 (!) 49 61  Resp: 17 18 18 18   Temp: 98.3 F (36.8 C) 98 F (36.7 C) 98.2 F (36.8 C) 97.7 F (36.5 C)  TempSrc: Oral Oral Oral Oral  SpO2: 95% 93% 93% 97%  Weight:      Height:  General exam: Appears calm and comfortable.  Tolerating diet   The results of significant diagnostics from this hospitalization (including imaging, microbiology, ancillary and laboratory) are listed below for reference.     Procedures and Diagnostic Studies:   US  Abdomen Limited RUQ (LIVER/GB) Result Date: 03/26/2024 CLINICAL DATA:  Abdominal pain EXAM: ULTRASOUND ABDOMEN LIMITED RIGHT UPPER QUADRANT COMPARISON:  CT abdomen and pelvis 05/02/2023 FINDINGS: Gallbladder: Gallstones are present measuring  up to 8 mm. There is a small amount of pericholecystic fluid. Gallbladder wall is thickened measuring up to 5 mm. No sonographic Murphy sign noted by sonographer. Common bile duct: Diameter: 6.6 mm. Liver: No focal lesion identified. Within normal limits in parenchymal echogenicity. Portal vein is patent on color Doppler imaging with normal direction of blood flow towards the liver. Other: None. IMPRESSION: 1. Cholelithiasis with gallbladder wall thickening and pericholecystic fluid. Findings are concerning for acute cholecystitis. 2. Common bile duct measures within normal limits for patient's age. Electronically Signed   By: Greig Pique M.D.   On: 03/26/2024 22:23     Labs:   Basic Metabolic Panel: Recent Labs  Lab 03/27/24 0422 03/29/24 0454  NA 141 140  K 3.9 3.6  CL 103 103  CO2 25 23  GLUCOSE 99 94  BUN 12 10  CREATININE 0.73 0.62  CALCIUM  9.5 9.5   GFR Estimated Creatinine Clearance: 59.7 mL/min (by C-G formula based on SCr of 0.62 mg/dL). Liver Function Tests: Recent Labs  Lab 03/27/24 0422 03/28/24 1003 03/29/24 0454  AST 457* 136* 74*  ALT 486* 278* 211*  ALKPHOS 89 75 73  BILITOT 2.0* 0.9 0.7  PROT 6.3* 6.6 6.9  ALBUMIN 3.9 3.9 3.9   Recent Labs  Lab 03/28/24 0443  LIPASE 22   No results for input(s): AMMONIA in the last 168 hours. Coagulation profile No results for input(s): INR, PROTIME in the last 168 hours.  CBC: Recent Labs  Lab 03/27/24 0422 03/29/24 0454  WBC 9.6 8.8  HGB 13.7 14.4  HCT 41.3 43.7  MCV 87.5 88.5  PLT 207 223   Cardiac Enzymes: No results for input(s): CKTOTAL, CKMB, CKMBINDEX, TROPONINI in the last 168 hours. BNP: Invalid input(s): POCBNP CBG: No results for input(s): GLUCAP in the last 168 hours. D-Dimer No results for input(s): DDIMER in the last 72 hours. Hgb A1c No results for input(s): HGBA1C in the last 72 hours. Lipid Profile No results for input(s): CHOL, HDL, LDLCALC, TRIG,  CHOLHDL, LDLDIRECT in the last 72 hours. Thyroid  function studies No results for input(s): TSH, T4TOTAL, T3FREE, THYROIDAB in the last 72 hours.  Invalid input(s): FREET3 Anemia work up No results for input(s): VITAMINB12, FOLATE, FERRITIN, TIBC, IRON, RETICCTPCT in the last 72 hours. Microbiology Recent Results (from the past 240 hours)  Surgical pcr screen     Status: Abnormal   Collection Time: 03/28/24 12:37 AM   Specimen: Nasal Mucosa; Nasal Swab  Result Value Ref Range Status   MRSA, PCR NEGATIVE NEGATIVE Final   Staphylococcus aureus POSITIVE (A) NEGATIVE Final    Comment: (NOTE) The Xpert SA Assay (FDA approved for NASAL specimens in patients 5 years of age and older), is one component of a comprehensive surveillance program. It is not intended to diagnose infection nor to guide or monitor treatment. Performed at [facility], [address] [place], [place] [address]      Discharge Instructions:   Discharge Instructions     Diet - low sodium heart healthy   Complete by:  As directed    Increase activity slowly   Complete by: As directed       Allergies as of 03/29/2024       Reactions   Contrast Media [iodinated Contrast Media] Other (See Comments)   ? angioedema   Lisinopril  Other (See Comments)   ? angioedema   Repatha  [evolocumab ] Other (See Comments)   Arthritis like pain   Allegra [fexofenadine] Other (See Comments)   Makes pt nervous   Codeine Nausea And Vomiting   Losartan  Other (See Comments)   myalgia   Nsaids Other (See Comments)   stomach on fire feeling   Prednisone  Other (See Comments)   Causes elevation in blood pressure- ? All steroids cause same reaction   Pseudoephedrine Hcl Nausea And Vomiting   Sulfa Antibiotics Rash   Sulfur Rash   Zithromax  [azithromycin ] Other (See Comments)   Burns stomach        Medication List     PAUSE taking these medications    hydrochlorothiazide  25  MG tablet Wait to take this until your doctor or other care provider tells you to start again. Commonly known as: HYDRODIURIL  Take 1 tablet (25 mg total) by mouth daily.       TAKE these medications    amLODipine  10 MG tablet Commonly known as: NORVASC  Take 1 tablet (10 mg total) by mouth daily.   bisacodyl  5 MG EC tablet Commonly known as: DULCOLAX Take 1 tablet (5 mg total) by mouth daily as needed for moderate constipation.   carvedilol  6.25 MG tablet Commonly known as: COREG  Take 1 tablet (6.25 mg total) by mouth 2 (two) times daily.   ezetimibe  10 MG tablet Commonly known as: ZETIA  Take 1 tablet (10 mg total) by mouth daily.   fluocinonide -emollient 0.05 % cream Commonly known as: LIDEX -E Apply 1 Application topically 2 (two) times daily as needed.   gabapentin  100 MG capsule Commonly known as: NEURONTIN  Take 2 capsules (200 mg total) by mouth at bedtime.   hydrALAZINE  25 MG tablet Commonly known as: APRESOLINE  Take 1 tablet (25 mg total) by mouth 2 (two) times daily.   HYDROcodone -acetaminophen  7.5-325 MG tablet Commonly known as: NORCO Take 1 tablet by mouth every 8 (eight) hours 03/30/24 What changed: additional instructions   HYDROcodone -acetaminophen  7.5-325 MG tablet Commonly known as: NORCO Take 1 tablet by mouth every 8 (eight) hours. (02/29/24) Start taking on: April 30, 2024 What changed: Another medication with the same name was changed. Make sure you understand how and when to take each.   HYDROcodone -acetaminophen  7.5-325 MG tablet Commonly known as: NORCO Take 1 tablet by mouth every 8 (eight) hours. Start taking on: May 31, 2024 What changed: Another medication with the same name was changed. Make sure you understand how and when to take each.   meclizine  25 MG tablet Commonly known as: ANTIVERT  Take 25 mg by mouth 3 (three) times daily as needed for dizziness.   traZODone  50 MG tablet Commonly known as: DESYREL  Take 1/2 tablet (25  mg total) by mouth at bedtime as needed for sleep.   Uro-MP 118 MG Caps Take 1 capsule by mouth 3 (three) times daily as needed.   Vitamin D3 50 MCG (2000 UT) capsule Take 1 capsule (2,000 Units total) by mouth daily.        Follow-up Information     Maczis, Puja Gosai, PA-C Follow up on 04/19/2024.   Specialty: General Surgery Why: 9/25 at 1:30. Please bring a copy of your photo ID, insurance  card and arrive 30 minutes prior to your appointment for paperwork. Contact information: [address] [place] [address] [phone]         Katheen Roselie Rockford, NP Follow up in 1 week(s).   Specialty: Internal Medicine Contact information: [address] [place] [address] [phone]                  Time coordinating discharge: 10  Signed:  Harlene RAYMOND Bowl DO  [facility] 03/29/2024, 1:44 PM

## 2024-03-30 ENCOUNTER — Telehealth: Payer: Self-pay

## 2024-03-30 NOTE — Transitions of Care (Post Inpatient/ED Visit) (Signed)
   03/30/2024  Name: Heather Shaw MRN: [id number] DOB: 06-24-1941  Today's TOC FU Call Status: Today's TOC FU Call Status:: Unsuccessful Call (1st Attempt) Unsuccessful Call (1st Attempt) Date: 03/30/24  Attempted to reach the patient regarding the most recent Inpatient/ED visit.  Follow Up Plan: Additional outreach attempts will be made to reach the patient to complete the Transitions of Care (Post Inpatient/ED visit) call.   Signature Julian Lemmings, LPN [facility] Nurse Health Advisor Direct Dial [phone]

## 2024-04-02 ENCOUNTER — Encounter: Payer: Self-pay | Admitting: Internal Medicine

## 2024-04-02 ENCOUNTER — Ambulatory Visit: Admitting: Internal Medicine

## 2024-04-02 ENCOUNTER — Telehealth: Payer: Self-pay

## 2024-04-02 ENCOUNTER — Other Ambulatory Visit (HOSPITAL_COMMUNITY): Payer: Self-pay

## 2024-04-02 VITALS — BP 120/72 | HR 56 | Temp 97.8°F | Ht 64.0 in | Wt 192.6 lb

## 2024-04-02 DIAGNOSIS — K819 Cholecystitis, unspecified: Secondary | ICD-10-CM | POA: Diagnosis not present

## 2024-04-02 DIAGNOSIS — M51372 Other intervertebral disc degeneration, lumbosacral region with discogenic back pain and lower extremity pain: Secondary | ICD-10-CM

## 2024-04-02 DIAGNOSIS — Z79899 Other long term (current) drug therapy: Secondary | ICD-10-CM | POA: Diagnosis not present

## 2024-04-02 DIAGNOSIS — G8929 Other chronic pain: Secondary | ICD-10-CM | POA: Diagnosis not present

## 2024-04-02 DIAGNOSIS — N83209 Unspecified ovarian cyst, unspecified side: Secondary | ICD-10-CM

## 2024-04-02 DIAGNOSIS — F1120 Opioid dependence, uncomplicated: Secondary | ICD-10-CM

## 2024-04-02 DIAGNOSIS — N3010 Interstitial cystitis (chronic) without hematuria: Secondary | ICD-10-CM

## 2024-04-02 LAB — CBC WITH DIFFERENTIAL/PLATELET
Basophils Absolute: 0.1 K/uL (ref 0.0–0.1)
Basophils Relative: 1 % (ref 0.0–3.0)
Eosinophils Absolute: 0.3 K/uL (ref 0.0–0.7)
Eosinophils Relative: 4 % (ref 0.0–5.0)
HCT: 41.5 % (ref 36.0–46.0)
Hemoglobin: 13.9 g/dL (ref 12.0–15.0)
Lymphocytes Relative: 39 % (ref 12.0–46.0)
Lymphs Abs: 2.6 K/uL (ref 0.7–4.0)
MCHC: 33.4 g/dL (ref 30.0–36.0)
MCV: 85.3 fl (ref 78.0–100.0)
Monocytes Absolute: 0.5 K/uL (ref 0.1–1.0)
Monocytes Relative: 8 % (ref 3.0–12.0)
Neutro Abs: 3.2 K/uL (ref 1.4–7.7)
Neutrophils Relative %: 48 % (ref 43.0–77.0)
Platelets: 273 K/uL (ref 150.0–400.0)
RBC: 4.86 Mil/uL (ref 3.87–5.11)
RDW: 13.2 % (ref 11.5–15.5)
WBC: 6.7 K/uL (ref 4.0–10.5)

## 2024-04-02 LAB — COMPREHENSIVE METABOLIC PANEL WITH GFR
ALT: 54 U/L — ABNORMAL HIGH (ref 0–35)
AST: 21 U/L (ref 0–37)
Albumin: 4 g/dL (ref 3.5–5.2)
Alkaline Phosphatase: 53 U/L (ref 39–117)
BUN: 11 mg/dL (ref 6–23)
CO2: 27 meq/L (ref 19–32)
Calcium: 9.7 mg/dL (ref 8.4–10.5)
Chloride: 105 meq/L (ref 96–112)
Creatinine, Ser: 0.73 mg/dL (ref 0.40–1.20)
GFR: 77.26 mL/min (ref >60.00)
Glucose, Bld: 95 mg/dL (ref 70–99)
Potassium: 3.9 meq/L (ref 3.5–5.1)
Sodium: 140 meq/L (ref 135–145)
Total Bilirubin: 0.4 mg/dL (ref 0.2–1.2)
Total Protein: 6.9 g/dL (ref 6.0–8.3)

## 2024-04-02 MED ORDER — HYDROCODONE-ACETAMINOPHEN 7.5-325 MG PO TABS
1.0000 | ORAL_TABLET | Freq: Three times a day (TID) | ORAL | 0 refills | Status: DC
  Filled 2024-04-02: qty 90, 30d supply, fill #0

## 2024-04-02 NOTE — Progress Notes (Signed)
 [facility] PRIMARY CARE [facility] [address] [place] [address] Dept: [phone] Dept Fax: [phone]   Office Visit  Subjective:   Heather Shaw 1940-08-02 04/02/2024  Chief Complaint  Patient presents with   Hospitalization Follow-up    No concerns hospital follow up discharged Thursday on 9/4.    HPI:  Discussed the use of AI scribe software for clinical note transcription with the patient, who gave verbal consent to proceed.  History of Present Illness   Heather Shaw is an 83 year old female who presents for follow-up after gallbladder removal surgery.  She underwent hospitalization for cholecystitis , followed by gallbladder removal surgery five days ago and is experiencing soreness around the incision site and general tiredness. No nausea or vomiting. Regular bowel movements. No fever. She takes hydrocodone  three times a day for post-op pain, and for chronic pain.   Her diet has been adjusted post-surgery to avoid fatty foods and known triggers for her interstitial cystitis. She is consuming lean [place] patties, sweet potatoes, rice, vegetables without seasoning, vanilla pudding, vanilla wafers, blueberries, and pears. No issues with eating and she is gradually increasing her food intake.  She has a history of interstitial cystitis and ovarian cysts. An ultrasound was recently performed for the ovarian cysts, with the largest located on the left side. A follow-up is scheduled in three months to monitor the cysts with OBGYN.      The following portions of the patient's history were reviewed and updated as appropriate: past medical history, past surgical history, family history, social history, allergies, medications, and problem list.   Patient Active Problem List   Diagnosis Date Noted   Ovarian cyst 03/27/2024   Lactic acidosis 03/27/2024   Cholecystitis 03/26/2024   Obesity, morbid (HCC) 11/02/2023   Constipation  10/25/2023   Controlled substance agreement signed 08/05/2023   Insomnia 12/09/2022   Statin myopathy 09/06/2022   Hyperglycemia 09/25/2020   Atherosclerosis of aorta (HCC) 09/25/2020   Opioid type dependence, continuous (HCC) 08/15/2019   Chronic pain 08/11/2017   Seborrheic keratoses, inflamed 12/24/2016   Hearing loss 11/23/2016   Screening for colon cancer 11/05/2016   SVT (supraventricular tachycardia) (HCC)    Bradycardia 12/27/2015   Carpal tunnel syndrome 09/19/2014   Eczema 09/19/2014   Chronic interstitial cystitis 05/20/2014   Lumbar radiculopathy, chronic 12/31/2013   Degeneration of lumbosacral intervertebral disc 04/07/2011   Vitamin D  deficiency 10/03/2009   Dyslipidemia 07/16/2009   Essential hypertension 07/16/2009   Past Medical History:  Diagnosis Date   Angio-edema 03/27/2021   Formatting of this note might be different from the original.  Last Assessment & Plan:   Formatting of this note might be different from the original.  Resolved.  Likely due to IV contrast (lisinopril  also d/c'd)     Arthritis    Bladder pain    Bradycardia    DDD (degenerative disc disease), lumbosacral    Grief 05/24/2018   Hyperlipidemia    Hyperplastic colon polyp    Hypertension    IC (interstitial cystitis)    Insomnia 04/06/2010   Qualifier: Diagnosis of  By: Jenetta MD, Talia     Internal hemorrhoids    Polyp of rectum    PONV (postoperative nausea and vomiting)    Right wrist tendonitis 08/24/2017   Squamous papilloma    in esophageal polyp   Uterine fibroid    Vertigo    Wears glasses    Wears partial dentures    Past  Surgical History:  Procedure Laterality Date   CARPAL TUNNEL RELEASE Bilateral    CHOLECYSTECTOMY N/A 03/28/2024   Procedure: LAPAROSCOPIC CHOLECYSTECTOMY;  Surgeon: Teresa Lonni HERO, MD;  Location: [facility];  Service: General;  Laterality: N/A;  ICG Dye   COLONOSCOPY  11/02/2010   COLONOSCOPY W/ POLYPECTOMY  07/27/2007   CYSTO WITH HYDRODISTENSION  N/A 05/20/2014   Procedure: CYSTOSCOPY/HYDRODISTENSION, INSTILLATION OF CHLORPACTIN;  Surgeon: Alm GORMAN Fragmin, MD;  Location: [facility];  Service: Urology;  Laterality: N/A;   CYSTO/  HYDRODISTENTION/  INSTILLATION CLORPACTIC  09/21/2010   DILATION AND CURETTAGE OF UTERUS  07/26/1986   KNEE ARTHROSCOPY Right 11/18/2009   TRIGGER FINGER RELEASE Right 08/23/2016   Procedure: RIGHT RING FINGER TRIGGER RELEASE;  Surgeon: Alm Hummer, MD;  Location: [facility];  Service: Orthopedics;  Laterality: Right;   UPPER GASTROINTESTINAL ENDOSCOPY  10/17/2015   Family History  Problem Relation Age of Onset   Clotting disorder Mother 53   COPD Father    Heart attack Father 69   Emphysema Father    Hypertension Sister    Hypertension Brother    Stroke Paternal Aunt    Heart disease Maternal Grandmother    Hypertension Maternal Grandmother    Heart disease Maternal Grandfather    Hypertension Maternal Grandfather    Heart disease Paternal Grandmother    Hypertension Paternal Grandmother    Heart disease Paternal Grandfather    Hypertension Paternal Grandfather    Cystic fibrosis Son    Diabetes Son    Colon polyps Son    Hypertension Son    Colon cancer Neg Hx    Esophageal cancer Neg Hx    Stomach cancer Neg Hx    Rectal cancer Neg Hx     Current Outpatient Medications:    amLODipine  (NORVASC ) 10 MG tablet, Take 1 tablet (10 mg total) by mouth daily., Disp: 90 tablet, Rfl: 3   carvedilol  (COREG ) 6.25 MG tablet, Take 1 tablet (6.25 mg total) by mouth 2 (two) times daily., Disp: 180 tablet, Rfl: 2   Cholecalciferol (VITAMIN D3) 50 MCG (2000 UT) capsule, Take 1 capsule (2,000 Units total) by mouth daily., Disp: , Rfl:    ezetimibe  (ZETIA ) 10 MG tablet, Take 1 tablet (10 mg total) by mouth daily., Disp: 90 tablet, Rfl: 2   fluocinonide -emollient (LIDEX -E) 0.05 % cream, Apply 1 Application topically 2 (two) times daily as needed., Disp: 30 g, Rfl: 0   gabapentin   (NEURONTIN ) 100 MG capsule, Take 2 capsules (200 mg total) by mouth at bedtime., Disp: 180 capsule, Rfl: 1   hydrALAZINE  (APRESOLINE ) 25 MG tablet, Take 1 tablet (25 mg total) by mouth 2 (two) times daily., Disp: 180 tablet, Rfl: 3   meclizine  (ANTIVERT ) 25 MG tablet, Take 25 mg by mouth 3 (three) times daily as needed for dizziness., Disp: , Rfl:    traZODone  (DESYREL ) 50 MG tablet, Take 1/2 tablet (25 mg total) by mouth at bedtime as needed for sleep., Disp: 30 tablet, Rfl: 2   bisacodyl  (DULCOLAX) 5 MG EC tablet, Take 1 tablet (5 mg total) by mouth daily as needed for moderate constipation. (Patient not taking: Reported on 04/02/2024), Disp: , Rfl:    [Paused] hydrochlorothiazide  (HYDRODIURIL ) 25 MG tablet, Take 1 tablet (25 mg total) by mouth daily., Disp: 90 tablet, Rfl: 1   [START ON 04/30/2024] HYDROcodone -acetaminophen  (NORCO) 7.5-325 MG tablet, Take 1 tablet by mouth every 8 (eight) hours. (02/29/24), Disp: 90 tablet, Rfl: 0   [START ON 05/31/2024]  HYDROcodone -acetaminophen  (NORCO) 7.5-325 MG tablet, Take 1 tablet by mouth every 8 (eight) hours., Disp: 90 tablet, Rfl: 0   HYDROcodone -acetaminophen  (NORCO) 7.5-325 MG tablet, Take 1 tablet by mouth every 8 (eight) hours., Disp: 90 tablet, Rfl: 0   Meth-Hyo-M Bl-Na Phos-Ph Sal (URO-MP) 118 MG CAPS, Take 1 capsule by mouth 3 (three) times daily as needed. (Patient not taking: Reported on 04/02/2024), Disp: , Rfl:  Allergies  Allergen Reactions   Contrast Media [Iodinated Contrast Media] Other (See Comments)    ? angioedema   Lisinopril  Other (See Comments)    ? angioedema   Repatha  [Evolocumab ] Other (See Comments)    Arthritis like pain   Allegra [Fexofenadine] Other (See Comments)    Makes pt nervous   Codeine Nausea And Vomiting   Losartan  Other (See Comments)    myalgia   Nsaids Other (See Comments)    stomach on fire feeling    Prednisone  Other (See Comments)    Causes elevation in blood pressure- ? All steroids cause same reaction    Pseudoephedrine Hcl Nausea And Vomiting   Sulfa Antibiotics Rash   Sulfur Rash   Zithromax  [Azithromycin ] Other (See Comments)    Burns stomach     ROS: A complete ROS was performed with pertinent positives/negatives noted in the HPI. The remainder of the ROS are negative.    Objective:   Today's Vitals   04/02/24 0909  BP: 120/72  Pulse: (!) 56  Temp: 97.8 F (36.6 C)  TempSrc: Temporal  SpO2: 98%  Weight: 192 lb 9.6 oz (87.4 kg)  Height: 5' 4 (1.626 m)    GENERAL: Well-appearing, in NAD. Well nourished.  SKIN: Pink, warm and dry. 4 linear laparoscopic incision sites to RUQ, epigastric , and umbilical region - glue intact. No redness or active drainage.  NECK: Trachea midline. Full ROM w/o pain or tenderness. No lymphadenopathy.  RESPIRATORY: Chest wall symmetrical. Respirations even and non-labored. Breath sounds clear to auscultation bilaterally.  CARDIAC: S1, S2 present, regular rate and rhythm. Peripheral pulses 2+ bilaterally. GI: Abdomen soft, non-tender. Normoactive bowel sounds. No rebound tenderness. No hepatomegaly or splenomegaly. NEUROLOGIC: Steady, even gait.  PSYCH/MENTAL STATUS: Alert, oriented x 3. Cooperative, appropriate mood and affect.     Assessment & Plan:  Assessment and Plan    Cholecystitis, Postoperative care following cholecystectomy Postoperative status stable with expected soreness post-op. Incision sites healing well, no infection.  - Order blood work for liver function, electrolytes, and WBC count. - Advise to pat dry incision sites after showering and avoid picking at glue. - Keep follow-up appointment with general surgeon on September 21st.  Chronic pain syndrome with opioid dependence Chronic pain managed with hydrocodone , paused during hospitalization. Prescription management coordinated for future adjustments. - Send prescription for hydrocodone  to [facility] for September supply. - Advise to contact provider via MyChart for  future prescription adjustments.  Interstitial cystitis Manages condition by avoiding dietary triggers. No acute issues reported.  Left ovarian cyst Left ovarian cyst present, largest on recent ultrasound. Follow-up planned to monitor size and need for intervention. - Advise to follow up with OBGYN in three months for re-evaluation of ovarian cyst.       Meds ordered this encounter  Medications   HYDROcodone -acetaminophen  (NORCO) 7.5-325 MG tablet    Sig: Take 1 tablet by mouth every 8 (eight) hours.    Dispense:  90 tablet    Refill:  0    Only dispense tabs from [facility] per patient's preference. Fill  on or after 03/30/2024   Orders Placed This Encounter  Procedures   CBC with Differential/Platelet   Comp Met (CMET)   Lab Orders         CBC with Differential/Platelet         Comp Met (CMET)     No images are attached to the encounter or orders placed in the encounter.  Return for Scheduled Routine Office Visits and as needed.   Rosina Senters, FNP

## 2024-04-02 NOTE — Telephone Encounter (Signed)
 Patient has an appt w/ Rosina Senters 04/02/24 please advise  Copied from CRM [id number]. Topic: Clinical - Medication Question >> Mar 30, 2024  3:01 PM Aisha D wrote: Reason for CRM: Pt stated that she was recently discharged from the ER due to surgery and was only provided 12 pills of the HYDROcodone -acetaminophen  (NORCO) 7.5-325 MG tablet. Pt doesn't have a refill from the office for the medication until 10/6 and stated that she can't be off of the medication that long. Pt would like for [facility], to send a short supply to last until then and would like a callback with an update.

## 2024-04-02 NOTE — Telephone Encounter (Signed)
 Faxed DME order to [facility]

## 2024-04-02 NOTE — Transitions of Care (Post Inpatient/ED Visit) (Signed)
   04/02/2024  Name: Heather Shaw MRN: [id number] DOB: 1940/10/09  Today's TOC FU Call Status: Today's TOC FU Call Status:: Unsuccessful Call (1st Attempt) Unsuccessful Call (1st Attempt) Date: 03/30/24  Attempted to reach the patient regarding the most recent Inpatient/ED visit.  Follow Up Plan: No further outreach attempts will be made at this time. We have been unable to contact the patient. Patient already seen in office Signature  Julian Lemmings, LPN [facility] Nurse Health Advisor Direct Dial [phone]

## 2024-04-03 DIAGNOSIS — R2689 Other abnormalities of gait and mobility: Secondary | ICD-10-CM | POA: Diagnosis not present

## 2024-04-03 DIAGNOSIS — N83209 Unspecified ovarian cyst, unspecified side: Secondary | ICD-10-CM | POA: Diagnosis not present

## 2024-04-03 NOTE — Telephone Encounter (Signed)
 Patient was informed of medication at her appointment on 04/02/24

## 2024-04-04 ENCOUNTER — Ambulatory Visit: Payer: Self-pay | Admitting: Internal Medicine

## 2024-04-10 ENCOUNTER — Encounter: Payer: Self-pay | Admitting: Internal Medicine

## 2024-04-10 ENCOUNTER — Ambulatory Visit (INDEPENDENT_AMBULATORY_CARE_PROVIDER_SITE_OTHER): Admitting: Internal Medicine

## 2024-04-10 VITALS — BP 138/70 | HR 58 | Ht 64.0 in | Wt 193.5 lb

## 2024-04-10 DIAGNOSIS — K641 Second degree hemorrhoids: Secondary | ICD-10-CM | POA: Diagnosis not present

## 2024-04-10 DIAGNOSIS — K648 Other hemorrhoids: Secondary | ICD-10-CM

## 2024-04-10 NOTE — Patient Instructions (Signed)

## 2024-04-11 NOTE — Progress Notes (Signed)
 Heather Shaw is an 83 year old female with a history of colonic polyps, diverticulosis and hemorrhoids  She had hemorrhoidal banding x 2 to the right anterior and left lateral internal hemorrhoid in 2017  She recently has had recurrent prolapse symptoms and intermittent bleeding  She wishes to repeat hemorrhoidal banding  She also had laparoscopic cholecystectomy 10 days ago and has recovered well.  No further indigestion or epigastric pain since surgery.  Symptoms prior to banding: Prolapse and intermittent bleeding   PROCEDURE NOTE:  The patient presents with symptomatic grade 2 internal hemorrhoids, requesting rubber band ligation of her hemorrhoidal disease.  All risks, benefits and alternative forms of therapy were described and informed consent was obtained.   The anorectum was pre-medicated with 0.125% nitroglycerin ointment The decision was made to band the LL internal hemorrhoid, and the [facility] O'Regan System was used to perform band ligation without complication.   Digital anorectal examination was then performed to assure proper positioning of the band, and to adjust the banded tissue as required.  The patient was discharged home without pain or other issues.  Dietary and behavioral recommendations were given and along with follow-up instructions.     The patient will return as scheduled for follow-up and possible additional banding as required. No complications were encountered and the patient tolerated the procedure well.

## 2024-04-23 ENCOUNTER — Other Ambulatory Visit (HOSPITAL_COMMUNITY): Payer: Self-pay

## 2024-04-23 ENCOUNTER — Other Ambulatory Visit (HOSPITAL_BASED_OUTPATIENT_CLINIC_OR_DEPARTMENT_OTHER): Payer: Self-pay

## 2024-04-24 ENCOUNTER — Other Ambulatory Visit (HOSPITAL_COMMUNITY): Payer: Self-pay

## 2024-04-29 DIAGNOSIS — J069 Acute upper respiratory infection, unspecified: Secondary | ICD-10-CM | POA: Diagnosis not present

## 2024-04-29 DIAGNOSIS — R051 Acute cough: Secondary | ICD-10-CM | POA: Diagnosis not present

## 2024-04-29 DIAGNOSIS — R0981 Nasal congestion: Secondary | ICD-10-CM | POA: Diagnosis not present

## 2024-04-29 DIAGNOSIS — R0982 Postnasal drip: Secondary | ICD-10-CM | POA: Diagnosis not present

## 2024-04-30 ENCOUNTER — Other Ambulatory Visit (HOSPITAL_COMMUNITY): Payer: Self-pay

## 2024-05-01 ENCOUNTER — Other Ambulatory Visit (HOSPITAL_COMMUNITY): Payer: Self-pay

## 2024-05-02 ENCOUNTER — Other Ambulatory Visit (HOSPITAL_COMMUNITY): Payer: Self-pay

## 2024-05-03 ENCOUNTER — Encounter: Payer: Self-pay | Admitting: Internal Medicine

## 2024-05-03 ENCOUNTER — Ambulatory Visit (INDEPENDENT_AMBULATORY_CARE_PROVIDER_SITE_OTHER): Admitting: Internal Medicine

## 2024-05-03 VITALS — BP 118/72 | HR 68 | Ht 64.5 in | Wt 194.0 lb

## 2024-05-03 DIAGNOSIS — K648 Other hemorrhoids: Secondary | ICD-10-CM

## 2024-05-03 DIAGNOSIS — K641 Second degree hemorrhoids: Secondary | ICD-10-CM | POA: Diagnosis not present

## 2024-05-03 NOTE — Patient Instructions (Signed)
 HEMORRHOID BANDING PROCEDURE    FOLLOW-UP CARE   The procedure you have had should have been relatively painless since the banding of the area involved does not have nerve endings and there is no pain sensation.  The rubber band cuts off the blood supply to the hemorrhoid and the band may fall off as soon as 48 hours after the banding (the band may occasionally be seen in the toilet bowl following a bowel movement). You may notice a temporary feeling of fullness in the rectum which should respond adequately to plain Tylenol  or Motrin.  Following the banding, avoid strenuous exercise that evening and resume full activity the next day.  A sitz bath (soaking in a warm tub) or bidet is soothing, and can be useful for cleansing the area after bowel movements.     To avoid constipation, take two tablespoons of natural wheat bran, natural oat bran, flax, Benefiber or any over the counter fiber supplement and increase your water  intake to 7-8 glasses daily.    Unless you have been prescribed anorectal medication, do not put anything inside your rectum for two weeks: No suppositories, enemas, fingers, etc.  Occasionally, you may have more bleeding than usual after the banding procedure.  This is often from the untreated hemorrhoids rather than the treated one.  Don't be concerned if there is a tablespoon or so of blood.  If there is more blood than this, lie flat with your bottom higher than your head and apply an ice pack to the area. If the bleeding does not stop within a half an hour or if you feel faint, call our office at [phone] or go to the emergency room.  Problems are not common; however, if there is a substantial amount of bleeding, severe pain, chills, fever or difficulty passing urine (very rare) or other problems, you should call us  at [phone] or report to the nearest emergency room.  Do not stay seated continuously for more than 2-3 hours for a day or two after the procedure.   Tighten your buttock muscles 10-15 times every two hours and take 10-15 deep breaths every 1-2 hours.  Do not spend more than a few minutes on the toilet if you cannot empty your bowel; instead re-visit the toilet at a later time.   _______________________________________________________  If your blood pressure at your visit was 140/90 or greater, please contact your primary care physician to follow up on this.  _______________________________________________________  If you are age 1 or older, your body mass index should be between 23-30. Your Body mass index is 32.79 kg/m. If this is out of the aforementioned range listed, please consider follow up with your Primary Care Provider.  If you are age 81 or younger, your body mass index should be between 19-25. Your Body mass index is 32.79 kg/m. If this is out of the aformentioned range listed, please consider follow up with your Primary Care Provider.   ________________________________________________________  The [place] [facility] providers would like to encourage you to use MYCHART to communicate with providers for non-urgent requests or questions.  Due to long hold times on the telephone, sending your provider a message by [facility] may be a faster and more efficient way to get a response.  Please allow 48 business hours for a response.  Please remember that this is for non-urgent requests.  _______________________________________________________  [facility] is using a team-based approach to care.  Your team is made up of your doctor and two  to three APPS. Our APPS (Nurse Practitioners and Physician Assistants) work with your physician to ensure care continuity for you. They are fully qualified to address your health concerns and develop a treatment plan. They communicate directly with your gastroenterologist to care for you. Seeing the Advanced Practice Practitioners on your physician's team can help you by facilitating care more  promptly, often allowing for earlier appointments, access to diagnostic testing, procedures, and other specialty referrals.

## 2024-05-03 NOTE — Progress Notes (Signed)
 Heather Shaw returns today for additional hemorrhoidal banding.  She is an 83 year old female with a history of symptomatic hemorrhoids, colonic polyps and diverticulosis  She had hemorrhoidal banding x 2 in 2017  She had recurrent prolapse symptoms and intermittent bleeding  For this cycle she had banding to the left lateral internal hemorrhoid on 04/10/2024 and reports significant improvement in symptoms with just 1 visit  She wishes to proceed with additional banding today   PROCEDURE NOTE:  The patient presents with symptomatic grade 2 internal hemorrhoids, requesting rubber band ligation of her hemorrhoidal disease.  All risks, benefits and alternative forms of therapy were described and informed consent was obtained.   The anorectum was pre-medicated with 0.125% nitroglycerin ointment The decision was made to band the RA (LL banded at visit #1) internal hemorrhoid, and the [facility] O'Regan System was used to perform band ligation without complication.   Digital anorectal examination was then performed to assure proper positioning of the band, and to adjust the banded tissue as required.  The patient was discharged home without pain or other issues.  Dietary and behavioral recommendations were given and along with follow-up instructions.     The patient will return as scheduled for follow-up and possible additional banding as required. No complications were encountered and the patient tolerated the procedure well.

## 2024-05-09 ENCOUNTER — Other Ambulatory Visit (HOSPITAL_COMMUNITY): Payer: Self-pay

## 2024-05-22 ENCOUNTER — Other Ambulatory Visit: Payer: Self-pay

## 2024-05-25 ENCOUNTER — Other Ambulatory Visit: Payer: Self-pay

## 2024-05-25 NOTE — Patient Outreach (Signed)
 Complex Care Management   Visit Note  05/25/2024  Name:  Heather Shaw MRN: [id number] DOB: 1941/01/27  Situation: Referral received for Complex Care Management related to HTN and chronic pain I obtained verbal consent from Patient.  Visit completed with Patient  on the phone  Background:   Past Medical History:  Diagnosis Date   Angio-edema 03/27/2021   Formatting of this note might be different from the original.  Last Assessment & Plan:   Formatting of this note might be different from the original.  Resolved.  Likely due to IV contrast (lisinopril  also d/c'd)     Arthritis    Bladder pain    Bradycardia    DDD (degenerative disc disease), lumbosacral    Grief 05/24/2018   Hyperlipidemia    Hyperplastic colon polyp    Hypertension    IC (interstitial cystitis)    Insomnia 04/06/2010   Qualifier: Diagnosis of  By: Jenetta MD, Talia     Internal hemorrhoids    Polyp of rectum    PONV (postoperative nausea and vomiting)    Right wrist tendonitis 08/24/2017   Squamous papilloma    in esophageal polyp   Uterine fibroid    Vertigo    Wears glasses    Wears partial dentures     Assessment: Patient Reported Symptoms:  Cognitive Cognitive Status: No symptoms reported, Alert and oriented to person, place, and time      Neurological Neurological Review of Symptoms: No symptoms reported (patient reports had hearing test, but has not gotten hearing aids yet) Neurological Management Strategies: Routine screening  HEENT HEENT Symptoms Reported: No symptoms reported      Cardiovascular Cardiovascular Symptoms Reported: No symptoms reported    Respiratory Respiratory Symptoms Reported: No symptoms reported    Endocrine Endocrine Symptoms Reported: No symptoms reported Is patient diabetic?: No    Gastrointestinal Gastrointestinal Symptoms Reported: No symptoms reported Other Gastrointestinal Symptoms: patient denies constipation at this time. she reports she is eating more  fiber taking Miralax  as needed. Patient had emergency lap cholecytectomy on 03/28/24. Denies any issues or concerns at this time.      Genitourinary Genitourinary Symptoms Reported: Other Other Genitourinary Symptoms: history Interstitial Cystitis (IC) managed with medications and diet. avoiding foods that trigger to help manage. drinks primarily water . Genitourinary Management Strategies: Medication therapy, Adequate rest, Diet modification  Integumentary Integumentary Symptoms Reported: No symptoms reported Additional Integumentary Details: history of back and stomach, seborrheic kerotosis    Musculoskeletal Musculoskelatal Symptoms Reviewed: Back pain, Joint pain Additional Musculoskeletal Details: reports right leg/knee sciatica, arthritis. patient reports managed by pain medication Musculoskeletal Management Strategies: Medication therapy, Adequate rest, Routine screening      Psychosocial Psychosocial Symptoms Reported: Other Other Psychosocial Conditions: patient expresses concerns about her daughter who has just been diagnosed with cancer. patient denies depression or anxiety-RNCM supportive/Active listening.          Vitals:   05/25/24 1406  BP: 128/78  Pulse: (!) 56    Medications Reviewed Today     Reviewed by Wenonah Milo M, RN (Registered Nurse) on 05/25/24 at 1405  Med List Status: <None>   Medication Order Taking? Sig Documenting Provider Last Dose Status Informant  amLODipine  (NORVASC ) 10 MG tablet [id number] Yes Take 1 tablet (10 mg total) by mouth daily. [name], Roselie Rockford, NP  Active   bisacodyl  (DULCOLAX) 5 MG EC tablet [id number] Yes Take 1 tablet (5 mg total) by mouth daily as needed for moderate constipation. Vann, Jessica U, DO  Active   carvedilol  (COREG ) 6.25 MG tablet [id number] Yes Take 1 tablet (6.25 mg total) by mouth 2 (two) times daily. Mona Vinie BROCKS, MD  Active   Cholecalciferol (VITAMIN D3) 50 MCG (2000 UT) capsule [id number] Yes Take 1 capsule  (2,000 Units total) by mouth daily. [name], Roselie Rockford, NP  Active   estradiol  (ESTRACE ) 0.1 MG/GM vaginal cream [id number]  Place 1 Applicatorful vaginally 3 (three) times a week.  Patient not taking: Reported on 05/25/2024   [provider]  Active   ezetimibe  (ZETIA ) 10 MG tablet [id number] Yes Take 1 tablet (10 mg total) by mouth daily. Mona Vinie BROCKS, MD  Active   fluocinonide -emollient (LIDEX -E) 0.05 % cream [id number] Yes Apply 1 Application topically 2 (two) times daily as needed. Katheen Roselie Rockford, NP  Active   gabapentin  (NEURONTIN ) 100 MG capsule [id number] Yes Take 2 capsules (200 mg total) by mouth at bedtime. [name], Roselie Rockford, NP  Active   hydrALAZINE  (APRESOLINE ) 25 MG tablet [id number] Yes Take 1 tablet (25 mg total) by mouth 2 (two) times daily. [name], Roselie Rockford, NP  Active   hydrochlorothiazide  (HYDRODIURIL ) 25 MG tablet [id number]  Take 1 tablet (25 mg total) by mouth daily.  Patient not taking: Reported on 05/25/2024   Katheen Roselie Rockford, NP  Active   HYDROcodone -acetaminophen  (NORCO) 7.5-325 MG tablet [id number] Yes Take 1 tablet by mouth every 8 (eight) hours. (02/29/24) [name], Roselie Rockford, NP  Active   HYDROcodone -acetaminophen  (NORCO) 7.5-325 MG tablet [id number]  Take 1 tablet by mouth every 8 (eight) hours. [name], Roselie Rockford, NP  Active   HYDROcodone -acetaminophen  (NORCO) 7.5-325 MG tablet [id number]  Take 1 tablet by mouth every 8 (eight) hours. Billy Knee, FNP  Active   meclizine  (ANTIVERT ) 25 MG tablet [id number] Yes Take 25 mg by mouth 3 (three) times daily as needed for dizziness. [provider]  Active   Meth-Hyo-M Bl-Na Phos-Ph Sal (URO-MP) 118 MG CAPS [id number] Yes Take 1 capsule by mouth 3 (three) times daily as needed. [provider]  Active   traZODone  (DESYREL ) 50 MG tablet [id number] Yes Take 1/2 tablet (25 mg total) by mouth at bedtime as needed for sleep. [name], Roselie Rockford, NP  Active           Recommendation:   PCP  Follow-up  Follow Up Plan:   Telephone follow up appointment date/time:  06/19/24 at 3:00 pm  Heddy Shutter, RN, MSN, BSN, CCM [place]  [facility], Population Health Case Manager Phone: [phone]

## 2024-05-25 NOTE — Patient Instructions (Signed)
 Visit Information  Thank you for taking time to visit with me today. Please don't hesitate to contact me if I can be of assistance to you before our next scheduled appointment.  Your next care management appointment is by telephone on 06/19/24 at 3:00 pm  Please call the care guide team at [phone] if you need to cancel, schedule, or reschedule an appointment.   Please call the Suicide and Crisis Lifeline: 988 call the [place]  National Suicide Prevention Lifeline: (870) 426-4957 or TTY: 850-164-7310 TTY 2148093205) to talk to a trained counselor if you are experiencing a Mental Health or Behavioral Health Crisis or need someone to talk to.  Heddy Shutter, RN, MSN, BSN, CCM [place]  [facility], Population Health Case Manager Phone: [phone]

## 2024-05-28 ENCOUNTER — Other Ambulatory Visit (HOSPITAL_COMMUNITY): Payer: Self-pay

## 2024-05-30 ENCOUNTER — Other Ambulatory Visit: Payer: Self-pay

## 2024-05-30 ENCOUNTER — Encounter: Payer: Self-pay | Admitting: Nurse Practitioner

## 2024-05-30 ENCOUNTER — Other Ambulatory Visit (HOSPITAL_COMMUNITY): Payer: Self-pay

## 2024-05-30 DIAGNOSIS — M51372 Other intervertebral disc degeneration, lumbosacral region with discogenic back pain and lower extremity pain: Secondary | ICD-10-CM

## 2024-05-30 DIAGNOSIS — G8929 Other chronic pain: Secondary | ICD-10-CM

## 2024-05-30 DIAGNOSIS — Z79899 Other long term (current) drug therapy: Secondary | ICD-10-CM

## 2024-05-30 DIAGNOSIS — F1120 Opioid dependence, uncomplicated: Secondary | ICD-10-CM

## 2024-05-30 MED ORDER — HYDROCODONE-ACETAMINOPHEN 7.5-325 MG PO TABS
1.0000 | ORAL_TABLET | Freq: Three times a day (TID) | ORAL | 0 refills | Status: DC
  Filled 2024-05-30: qty 81, 27d supply, fill #0
  Filled 2024-05-30: qty 9, 3d supply, fill #0

## 2024-05-30 NOTE — Telephone Encounter (Signed)
Provider responded to patient's MyChart message

## 2024-05-30 NOTE — Telephone Encounter (Unsigned)
 Copied from CRM [id number]. Topic: Clinical - Medication Question >> May 30, 2024 12:09 PM Viola F wrote: Reason for CRM: Patient called to follow up on MyChart message that was sent this morning regarding the HYDROcodone -acetaminophen  (NORCO) 7.5-325 MG tablet - refill is due for tomorrow but she would like it filled today because she won't have her car for tomorrow and can only pick it up today. Please call her when medication is filled to let her know at [phone] (M)

## 2024-05-31 ENCOUNTER — Other Ambulatory Visit (HOSPITAL_COMMUNITY): Payer: Self-pay

## 2024-06-13 ENCOUNTER — Encounter (HOSPITAL_COMMUNITY): Payer: Self-pay

## 2024-06-13 ENCOUNTER — Telehealth

## 2024-06-13 ENCOUNTER — Other Ambulatory Visit (HOSPITAL_COMMUNITY): Payer: Self-pay

## 2024-06-15 ENCOUNTER — Other Ambulatory Visit (HOSPITAL_COMMUNITY): Payer: Self-pay

## 2024-06-15 ENCOUNTER — Telehealth: Payer: Self-pay

## 2024-06-15 ENCOUNTER — Ambulatory Visit (INDEPENDENT_AMBULATORY_CARE_PROVIDER_SITE_OTHER): Admitting: Nurse Practitioner

## 2024-06-15 ENCOUNTER — Encounter: Payer: Self-pay | Admitting: Nurse Practitioner

## 2024-06-15 VITALS — BP 150/80 | HR 58 | Temp 98.2°F | Ht 64.5 in | Wt 195.0 lb

## 2024-06-15 DIAGNOSIS — I700 Atherosclerosis of aorta: Secondary | ICD-10-CM

## 2024-06-15 DIAGNOSIS — F1120 Opioid dependence, uncomplicated: Secondary | ICD-10-CM

## 2024-06-15 DIAGNOSIS — R300 Dysuria: Secondary | ICD-10-CM

## 2024-06-15 DIAGNOSIS — Z79899 Other long term (current) drug therapy: Secondary | ICD-10-CM

## 2024-06-15 DIAGNOSIS — M51372 Other intervertebral disc degeneration, lumbosacral region with discogenic back pain and lower extremity pain: Secondary | ICD-10-CM

## 2024-06-15 DIAGNOSIS — G8929 Other chronic pain: Secondary | ICD-10-CM

## 2024-06-15 DIAGNOSIS — K5903 Drug induced constipation: Secondary | ICD-10-CM

## 2024-06-15 DIAGNOSIS — Z23 Encounter for immunization: Secondary | ICD-10-CM

## 2024-06-15 DIAGNOSIS — I10 Essential (primary) hypertension: Secondary | ICD-10-CM

## 2024-06-15 DIAGNOSIS — R739 Hyperglycemia, unspecified: Secondary | ICD-10-CM

## 2024-06-15 DIAGNOSIS — E559 Vitamin D deficiency, unspecified: Secondary | ICD-10-CM

## 2024-06-15 LAB — POC URINALSYSI DIPSTICK (AUTOMATED)
Bilirubin, UA: NEGATIVE
Blood, UA: NEGATIVE
Glucose, UA: NEGATIVE
Ketones, UA: NEGATIVE
Nitrite, UA: NEGATIVE
Protein, UA: NEGATIVE
Spec Grav, UA: 1.02 (ref 1.010–1.025)
Urobilinogen, UA: NEGATIVE U/dL — AB
pH, UA: 6 (ref 5.0–8.0)

## 2024-06-15 MED ORDER — HYDROCODONE-ACETAMINOPHEN 7.5-325 MG PO TABS
1.0000 | ORAL_TABLET | Freq: Three times a day (TID) | ORAL | 0 refills | Status: AC
  Filled 2024-08-30 (×2): qty 90, 30d supply, fill #0

## 2024-06-15 MED ORDER — HYDROCHLOROTHIAZIDE 25 MG PO TABS
25.0000 mg | ORAL_TABLET | Freq: Every day | ORAL | 1 refills | Status: AC
  Filled 2024-06-15: qty 90, 90d supply, fill #0

## 2024-06-15 MED ORDER — GABAPENTIN 100 MG PO CAPS
ORAL_CAPSULE | ORAL | 5 refills | Status: AC
  Filled 2024-06-15: qty 120, 30d supply, fill #0
  Filled 2024-08-30 (×2): qty 120, 30d supply, fill #1

## 2024-06-15 MED ORDER — HYDROCODONE-ACETAMINOPHEN 7.5-325 MG PO TABS
1.0000 | ORAL_TABLET | Freq: Three times a day (TID) | ORAL | 0 refills | Status: AC
  Filled 2024-07-30: qty 90, 30d supply, fill #0

## 2024-06-15 MED ORDER — HYDROCODONE-ACETAMINOPHEN 7.5-325 MG PO TABS
1.0000 | ORAL_TABLET | Freq: Three times a day (TID) | ORAL | 0 refills | Status: AC
  Filled 2024-06-28: qty 78, 26d supply, fill #0
  Filled 2024-06-28: qty 12, 4d supply, fill #0
  Filled ????-??-??: fill #0

## 2024-06-15 NOTE — Assessment & Plan Note (Signed)
 F/up with Dr. Mona Current use of zetia  only Repeat NMR profile

## 2024-06-15 NOTE — Telephone Encounter (Signed)
 Called patient and left a voice message for patient per DPR and stated that I did not give her a call and that I do not see where Roselie may have called her. Stated that if I find out different I will give her a call back with the needed information.

## 2024-06-15 NOTE — Assessment & Plan Note (Signed)
 Repeat hgbA1c

## 2024-06-15 NOTE — Patient Instructions (Addendum)
 Resume use of hydrochlorothiazide  Maintain other med doses Increase gabapentin  dose to 1tab in am and 2tabs in PM. Ok to take 1tab at noon if needed Check BP at home in AM, no less than 3x/week. Call office if BP >140/80 for more than 2days. Bring BP readings and machine to next appointment. Maintain a low salt/sodium diet, and daily exercise. Go to lab

## 2024-06-15 NOTE — Telephone Encounter (Signed)
 Copied from CRM [id number]. Topic: General - Other >> Jun 15, 2024 12:54 PM Heather Shaw wrote: Reason for CRM: Patient called in regarding a missed call from the office, patient would like a callback

## 2024-06-15 NOTE — Progress Notes (Unsigned)
 Established Patient Visit  Patient: Heather Shaw   DOB: 1940-09-25   83 y.o. Female  MRN: [id number] Visit Date: 06/18/2024  Subjective:    Chief Complaint  Patient presents with   Follow-up    FASTING 3 month follow up  Burning with urination for a 3 week    HPI Constipation Improved with increased dietary fiber. Use of miralax  prn   Chronic pain Reports pain is not controlled with current hydrocodone /acetaminophen  7.5/325mg  1tab TID and gabepentin 200mg  at hs Pain remains at 7/10. Constipation has resolved with increased dietary fiber and adequate oral hydration Contract updated today UDS collected today PMP database reviewed: 04/02/2024, 05/01/2024 and 05/31/2024  Maintain hydrocodone  dose. Sent refill for 06/29/2024. 07/30/2024, and 08/30/2024 Increase gabapentin  dose to 100mg  in AM and noon, 200mg  in PM F/up in 3months  Hyperglycemia Repeat hgbA1c  Atherosclerosis of aorta F/up with Dr. Mona Current use of zetia  only Repeat NMR profile  Essential hypertension BP not at goal Did not take med this AM BP Readings from Last 3 Encounters:  06/15/24 (!) 150/80  05/25/24 128/78  05/03/24 118/72    Advised to take meds as prescribed, Check BP at home in AM, no less than 3x/week. Call office if BP >140/80 for more than 2days. Bring BP readings and machine to next appointment. Maintain a low salt/sodium diet, and daily exercise. F/up in 2months  Uncontrolled pain, rate pain a 7/10 constant.  BP Readings from Last 3 Encounters:  06/15/24 (!) 150/80  05/25/24 128/78  05/03/24 118/72    Reviewed medical, surgical, and social history today  Medications: Outpatient Medications Prior to Visit  Medication Sig   amLODipine  (NORVASC ) 10 MG tablet Take 1 tablet (10 mg total) by mouth daily.   bisacodyl  (DULCOLAX) 5 MG EC tablet Take 1 tablet (5 mg total) by mouth daily as needed for moderate constipation.   carvedilol  (COREG ) 6.25 MG tablet Take  1 tablet (6.25 mg total) by mouth 2 (two) times daily.   Cholecalciferol (VITAMIN D3) 50 MCG (2000 UT) capsule Take 1 capsule (2,000 Units total) by mouth daily.   ezetimibe  (ZETIA ) 10 MG tablet Take 1 tablet (10 mg total) by mouth daily.   fluocinonide -emollient (LIDEX -E) 0.05 % cream Apply 1 Application topically 2 (two) times daily as needed.   hydrALAZINE  (APRESOLINE ) 25 MG tablet Take 1 tablet (25 mg total) by mouth 2 (two) times daily.   meclizine  (ANTIVERT ) 25 MG tablet Take 25 mg by mouth 3 (three) times daily as needed for dizziness.   Meth-Hyo-M Bl-Na Phos-Ph Sal (URO-MP) 118 MG CAPS Take 1 capsule by mouth 3 (three) times daily as needed.   traZODone  (DESYREL ) 50 MG tablet Take 1/2 tablet (25 mg total) by mouth at bedtime as needed for sleep.   [DISCONTINUED] gabapentin  (NEURONTIN ) 100 MG capsule Take 2 capsules (200 mg total) by mouth at bedtime.   [DISCONTINUED] HYDROcodone -acetaminophen  (NORCO) 7.5-325 MG tablet Take 1 tablet by mouth every 8 (eight) hours.   [DISCONTINUED] estradiol  (ESTRACE ) 0.1 MG/GM vaginal cream Place 1 Applicatorful vaginally 3 (three) times a week. (Patient not taking: Reported on 06/15/2024)   [DISCONTINUED] hydrochlorothiazide  (HYDRODIURIL ) 25 MG tablet Take 1 tablet (25 mg total) by mouth daily. (Patient not taking: Reported on 05/25/2024)   No facility-administered medications prior to visit.   Reviewed past medical and social history.   ROS per HPI above  {Show previous labs (optional):23779}    Objective:  BP (!) 150/80 (BP Location: Left Arm, Patient Position: Sitting)   Pulse (!) 58   Temp 98.2 F (36.8 C) (Oral)   Ht 5' 4.5 (1.638 m)   Wt 195 lb (88.5 kg)   SpO2 96%   BMI 32.95 kg/m      Physical Exam Vitals and nursing note reviewed.  Cardiovascular:     Rate and Rhythm: Normal rate and regular rhythm.     Pulses: Normal pulses.     Heart sounds: Normal heart sounds.  Pulmonary:     Effort: Pulmonary effort is normal.      Breath sounds: Normal breath sounds.  Musculoskeletal:     Right lower leg: No edema.     Left lower leg: No edema.  Neurological:     Mental Status: She is alert and oriented to person, place, and time.     Results for orders placed or performed in visit on 06/15/24  Urine Culture   Specimen: Urine  Result Value Ref Range   MICRO NUMBER: [id number]    SPECIMEN QUALITY: Adequate    Sample Source NOT GIVEN    STATUS: FINAL    Result: No Growth   DRUG MONITORING, PANEL 8 WITH CONFIRMATION, URINE  Result Value Ref Range   Alcohol Metabolites NEGATIVE <500 ng/mL   Amphetamines NEGATIVE <500 ng/mL   Benzodiazepines NEGATIVE <100 ng/mL   Buprenorphine, Urine NEGATIVE <5 ng/mL   Cocaine Metabolite NEGATIVE <150 ng/mL   6 Acetylmorphine NEGATIVE <10 ng/mL   Marijuana Metabolite NEGATIVE <20 ng/mL   MDMA NEGATIVE <500 ng/mL   Opiates POSITIVE (A) <100 ng/mL   Codeine NEGATIVE <50 ng/mL   Hydrocodone  1,245 (H) <50 ng/mL   Hydromorphone  750 (H) <50 ng/mL   Morphine  NEGATIVE <50 ng/mL   Norhydrocodone 1,782 (H) <50 ng/mL   Opiates Comments     Oxycodone  NEGATIVE <100 ng/mL   Creatinine 71.5 > or = 20.0 mg/dL   pH 5.2 4.5 - 9.0   Oxidant NEGATIVE <200 mcg/mL  NMR, lipoprofile  Result Value Ref Range   LDL Particle Number 1,351 (H) <1,000 nmol/L   LDL-C (NIH Calc) 107 (H) 0 - 99 mg/dL   HDL-C 44 >60 mg/dL   Triglycerides 860 0 - 149 mg/dL   Cholesterol, Total 823 100 - 199 mg/dL   HDL Particle Number 71.7 (L) >=30.5 umol/L   Small LDL Particle Number 582 (H) <=527 nmol/L   LDL Size 20.7 >20.5 nm   LP-IR Score 40 <=45  DM TEMPLATE  Result Value Ref Range   Notes and Comments    POCT Urinalysis Dipstick (Automated)  Result Value Ref Range   Color, UA Yellow    Clarity, UA Clear    Glucose, UA Negative Negative   Bilirubin, UA Negative    Ketones, UA Negative    Spec Grav, UA 1.020 1.010 - 1.025   Blood, UA Negative    pH, UA 6.0 5.0 - 8.0   Protein, UA Negative Negative    Urobilinogen, UA negative (A) 0.2 or 1.0 E.U./dL   Nitrite, UA Negative    Leukocytes, UA Trace (A) Negative      Assessment & Plan:    Problem List Items Addressed This Visit     Atherosclerosis of aorta   F/up with Dr. Mona Current use of zetia  only Repeat NMR profile      Relevant Medications   hydrochlorothiazide  (HYDRODIURIL ) 25 MG tablet   Other Relevant Orders   NMR, lipoprofile (Completed)   Chronic pain -  Primary   Reports pain is not controlled with current hydrocodone /acetaminophen  7.5/325mg  1tab TID and gabepentin 200mg  at hs Pain remains at 7/10. Constipation has resolved with increased dietary fiber and adequate oral hydration Contract updated today UDS collected today PMP database reviewed: 04/02/2024, 05/01/2024 and 05/31/2024  Maintain hydrocodone  dose. Sent refill for 06/29/2024. 07/30/2024, and 08/30/2024 Increase gabapentin  dose to 100mg  in AM and noon, 200mg  in PM F/up in 3months      Relevant Medications   gabapentin  (NEURONTIN ) 100 MG capsule   HYDROcodone -acetaminophen  (NORCO) 7.5-325 MG tablet (Start on 06/29/2024)   HYDROcodone -acetaminophen  (NORCO) 7.5-325 MG tablet (Start on 07/30/2024)   HYDROcodone -acetaminophen  (NORCO) 7.5-325 MG tablet (Start on 08/30/2024)   Other Relevant Orders   DRUG MONITORING, PANEL 8 WITH CONFIRMATION, URINE (Completed)   Constipation   Improved with increased dietary fiber. Use of miralax  prn       Controlled substance agreement signed   Relevant Medications   HYDROcodone -acetaminophen  (NORCO) 7.5-325 MG tablet (Start on 06/29/2024)   HYDROcodone -acetaminophen  (NORCO) 7.5-325 MG tablet (Start on 07/30/2024)   HYDROcodone -acetaminophen  (NORCO) 7.5-325 MG tablet (Start on 08/30/2024)   Degeneration of lumbosacral intervertebral disc   Relevant Medications   gabapentin  (NEURONTIN ) 100 MG capsule   HYDROcodone -acetaminophen  (NORCO) 7.5-325 MG tablet (Start on 06/29/2024)   HYDROcodone -acetaminophen  (NORCO) 7.5-325 MG  tablet (Start on 07/30/2024)   HYDROcodone -acetaminophen  (NORCO) 7.5-325 MG tablet (Start on 08/30/2024)   Essential hypertension   BP not at goal Did not take med this AM BP Readings from Last 3 Encounters:  06/15/24 (!) 150/80  05/25/24 128/78  05/03/24 118/72    Advised to take meds as prescribed, Check BP at home in AM, no less than 3x/week. Call office if BP >140/80 for more than 2days. Bring BP readings and machine to next appointment. Maintain a low salt/sodium diet, and daily exercise. F/up in 2months      Relevant Medications   hydrochlorothiazide  (HYDRODIURIL ) 25 MG tablet   Hyperglycemia   Repeat hgbA1c      Relevant Orders   Hemoglobin A1c   Opioid type dependence, continuous (HCC)   Relevant Medications   HYDROcodone -acetaminophen  (NORCO) 7.5-325 MG tablet (Start on 06/29/2024)   HYDROcodone -acetaminophen  (NORCO) 7.5-325 MG tablet (Start on 07/30/2024)   HYDROcodone -acetaminophen  (NORCO) 7.5-325 MG tablet (Start on 08/30/2024)   Vitamin D  deficiency   Relevant Orders   VITAMIN D  25 Hydroxy (Vit-D Deficiency, Fractures)   Other Visit Diagnoses       Burning with urination       Relevant Orders   POCT Urinalysis Dipstick (Automated) (Completed)   Urine Culture (Completed)     Primary hypertension       Relevant Medications   hydrochlorothiazide  (HYDRODIURIL ) 25 MG tablet     Immunization due       Relevant Orders   Flu vaccine HIGH DOSE PF(Fluzone Trivalent)      Return in about 6 weeks (around 07/27/2024) for HTN.     Roselie Mood, NP

## 2024-06-15 NOTE — Assessment & Plan Note (Signed)
 BP not at goal Did not take med this AM BP Readings from Last 3 Encounters:  06/15/24 (!) 150/80  05/25/24 128/78  05/03/24 118/72    Advised to take meds as prescribed, Check BP at home in AM, no less than 3x/week. Call office if BP >140/80 for more than 2days. Bring BP readings and machine to next appointment. Maintain a low salt/sodium diet, and daily exercise. F/up in 2months

## 2024-06-15 NOTE — Assessment & Plan Note (Addendum)
 Reports pain is not controlled with current hydrocodone /acetaminophen  7.5/325mg  1tab TID and gabepentin 200mg  at hs Pain remains at 7/10. Constipation has resolved with increased dietary fiber and adequate oral hydration Contract updated today UDS collected today PMP database reviewed: 04/02/2024, 05/01/2024 and 05/31/2024  Maintain hydrocodone  dose. Sent refill for 06/29/2024. 07/30/2024, and 08/30/2024 Increase gabapentin  dose to 100mg  in AM and noon, 200mg  in PM F/up in 3months

## 2024-06-15 NOTE — Assessment & Plan Note (Signed)
 Improved with increased dietary fiber. Use of miralax  prn

## 2024-06-17 ENCOUNTER — Encounter: Payer: Self-pay | Admitting: Nurse Practitioner

## 2024-06-17 LAB — URINE CULTURE
MICRO NUMBER:: 17268331
Result:: NO GROWTH
SPECIMEN QUALITY:: ADEQUATE

## 2024-06-17 LAB — DRUG MONITORING, PANEL 8 WITH CONFIRMATION, URINE
6 Acetylmorphine: NEGATIVE ng/mL (ref <10)
Alcohol Metabolites: NEGATIVE ng/mL (ref <500)
Amphetamines: NEGATIVE ng/mL (ref <500)
Benzodiazepines: NEGATIVE ng/mL (ref <100)
Buprenorphine, Urine: NEGATIVE ng/mL (ref <5)
Cocaine Metabolite: NEGATIVE ng/mL (ref <150)
Codeine: NEGATIVE ng/mL (ref <50)
Creatinine: 71.5 mg/dL (ref >=20.0)
Hydrocodone: 1245 ng/mL — ABNORMAL HIGH (ref <50)
Hydromorphone: 750 ng/mL — ABNORMAL HIGH (ref <50)
MDMA: NEGATIVE ng/mL (ref <500)
Marijuana Metabolite: NEGATIVE ng/mL (ref <20)
Morphine: NEGATIVE ng/mL (ref <50)
Norhydrocodone: 1782 ng/mL — ABNORMAL HIGH (ref <50)
Opiates: POSITIVE ng/mL — AB (ref <100)
Oxidant: NEGATIVE ug/mL (ref <200)
Oxycodone: NEGATIVE ng/mL (ref <100)
pH: 5.2 (ref 4.5–9.0)

## 2024-06-17 LAB — NMR, LIPOPROFILE
Cholesterol, Total: 176 mg/dL (ref 100–199)
HDL Particle Number: 28.2 umol/L — ABNORMAL LOW (ref >=30.5)
HDL-C: 44 mg/dL (ref >39)
LDL Particle Number: 1351 nmol/L — ABNORMAL HIGH (ref <1000)
LDL Size: 20.7 nm (ref >20.5)
LDL-C (NIH Calc): 107 mg/dL — ABNORMAL HIGH (ref 0–99)
LP-IR Score: 40 (ref <=45)
Small LDL Particle Number: 582 nmol/L — ABNORMAL HIGH (ref <=527)
Triglycerides: 139 mg/dL (ref 0–149)

## 2024-06-17 LAB — DM TEMPLATE

## 2024-06-18 ENCOUNTER — Ambulatory Visit: Payer: Self-pay | Admitting: Nurse Practitioner

## 2024-06-18 ENCOUNTER — Other Ambulatory Visit: Payer: Self-pay

## 2024-06-18 ENCOUNTER — Other Ambulatory Visit (HOSPITAL_COMMUNITY): Payer: Self-pay

## 2024-06-18 NOTE — Telephone Encounter (Signed)
 Called patient and left a detailed voice message per DPR stating that the lab was the missed call. Patient needs to come back into the office for lab draw that was missed at appointment please give me a call back at the office.

## 2024-06-19 ENCOUNTER — Other Ambulatory Visit (HOSPITAL_COMMUNITY): Payer: Self-pay

## 2024-06-19 ENCOUNTER — Telehealth: Payer: Self-pay

## 2024-06-19 NOTE — Telephone Encounter (Signed)
 Friday Jun 15, 2024 [facility] lab called patient to have the patient to come back to have a redraw. The lab did not leave a voicemail, and the nurse team was notified. Several calls and messages have been left to have patient come back in. Spoke to patients daughter who is listed on her DPR and she stated she will have her come back at her earliest convince.

## 2024-06-20 NOTE — Telephone Encounter (Signed)
 Copied from CRM [id number]. Topic: General - Call Back - No Documentation >> Jun 19, 2024  2:19 PM Dedra B wrote: Reason for CRM: Pt said she received a call at 2:15. Didn't see any documentation of call.

## 2024-06-20 NOTE — Telephone Encounter (Signed)
 Patient called and informed reason for my call Patient stated that she returned the call yesterday (06/19/24) and scheduled an appointment for Monday 12/01/2 for the labs. I thanked her for taking my call.

## 2024-06-25 ENCOUNTER — Telehealth: Payer: Self-pay

## 2024-06-25 ENCOUNTER — Other Ambulatory Visit

## 2024-06-25 NOTE — Telephone Encounter (Signed)
 Copied from CRM #[id number]. Topic: Clinical - Medical Advice >> Jun 25, 2024  9:01 AM Suzen RAMAN wrote: Reason for CRM: patient has a lab appt today to check her A1C but had a piece of pecan pie and would like to know if she should reschedule her lab appt or is okay to keep the scheduled appt?

## 2024-06-25 NOTE — Telephone Encounter (Signed)
 Patient rescheduled lab appointment for Wednesday 06/27/24

## 2024-06-27 ENCOUNTER — Ambulatory Visit: Payer: Self-pay | Admitting: Nurse Practitioner

## 2024-06-27 ENCOUNTER — Other Ambulatory Visit

## 2024-06-27 ENCOUNTER — Other Ambulatory Visit (HOSPITAL_COMMUNITY): Payer: Self-pay

## 2024-06-27 DIAGNOSIS — E559 Vitamin D deficiency, unspecified: Secondary | ICD-10-CM

## 2024-06-27 DIAGNOSIS — R739 Hyperglycemia, unspecified: Secondary | ICD-10-CM | POA: Diagnosis not present

## 2024-06-27 LAB — VITAMIN D 25 HYDROXY (VIT D DEFICIENCY, FRACTURES): VITD: 13.4 ng/mL — ABNORMAL LOW (ref 30.00–100.00)

## 2024-06-27 LAB — HEMOGLOBIN A1C: Hgb A1c MFr Bld: 5.5 % (ref 4.6–6.5)

## 2024-06-27 MED ORDER — VITAMIN D (ERGOCALCIFEROL) 1.25 MG (50000 UNIT) PO CAPS
50000.0000 [IU] | ORAL_CAPSULE | ORAL | 0 refills | Status: AC
  Filled 2024-06-27: qty 12, 84d supply, fill #0

## 2024-06-27 NOTE — Addendum Note (Signed)
 Addended by: KATHEEN GARDEN L on: 06/27/2024 12:24 PM   Modules accepted: Orders

## 2024-06-27 NOTE — Addendum Note (Signed)
 Addended by: ALTO PARODY D on: 06/27/2024 12:22 PM   Modules accepted: Orders

## 2024-06-28 ENCOUNTER — Telehealth: Payer: Self-pay | Admitting: Nurse Practitioner

## 2024-06-28 ENCOUNTER — Other Ambulatory Visit: Payer: Self-pay

## 2024-06-28 ENCOUNTER — Encounter: Admitting: Internal Medicine

## 2024-06-28 ENCOUNTER — Other Ambulatory Visit (HOSPITAL_COMMUNITY): Payer: Self-pay

## 2024-06-28 NOTE — Telephone Encounter (Signed)
 Called and spoke the [employer] the pharmacy staff and informed her of why I was calling. She stated that she would be able to get the Rx ready for pick up then she realized that patient uses a different brand and that it may need to be ordered she had to check. She informed me that she can give a partial refill. Thanked her for taking my call

## 2024-06-28 NOTE — Telephone Encounter (Signed)
 Pt is requesting that her Rx #: [id number]  HYDROcodone -acetaminophen  (NORCO) 7.5-325 MG tablet [[id number]] be filled early since it is not due for a refill until tomorrow and she is leery  of the weather.  Please call [phone]

## 2024-06-28 NOTE — Telephone Encounter (Signed)
 Called the patient and informed her that Roselie approved for her medication to be released early and that the pharmacy will have a limited supply due to her using a different brand on hand. She thanked me for calling and stated that she will go pick up the Rx.

## 2024-06-29 ENCOUNTER — Other Ambulatory Visit (HOSPITAL_COMMUNITY): Payer: Self-pay

## 2024-06-29 ENCOUNTER — Other Ambulatory Visit: Payer: Self-pay

## 2024-07-04 ENCOUNTER — Other Ambulatory Visit (HOSPITAL_BASED_OUTPATIENT_CLINIC_OR_DEPARTMENT_OTHER): Payer: Self-pay

## 2024-07-04 ENCOUNTER — Ambulatory Visit: Admitting: Internal Medicine

## 2024-07-04 VITALS — BP 110/62 | HR 51 | Ht 64.5 in | Wt 195.2 lb

## 2024-07-04 DIAGNOSIS — K641 Second degree hemorrhoids: Secondary | ICD-10-CM | POA: Diagnosis not present

## 2024-07-04 DIAGNOSIS — K648 Other hemorrhoids: Secondary | ICD-10-CM

## 2024-07-04 NOTE — Patient Instructions (Signed)
 HEMORRHOID BANDING PROCEDURE    FOLLOW-UP CARE   The procedure you have had should have been relatively painless since the banding of the area involved does not have nerve endings and there is no pain sensation.  The rubber band cuts off the blood supply to the hemorrhoid and the band may fall off as soon as 48 hours after the banding (the band may occasionally be seen in the toilet bowl following a bowel movement). You may notice a temporary feeling of fullness in the rectum which should respond adequately to plain Tylenol  or Motrin.  Following the banding, avoid strenuous exercise that evening and resume full activity the next day.  A sitz bath (soaking in a warm tub) or bidet is soothing, and can be useful for cleansing the area after bowel movements.     To avoid constipation, take two tablespoons of natural wheat bran, natural oat bran, flax, Benefiber or any over the counter fiber supplement and increase your water  intake to 7-8 glasses daily.    Unless you have been prescribed anorectal medication, do not put anything inside your rectum for two weeks: No suppositories, enemas, fingers, etc.  Occasionally, you may have more bleeding than usual after the banding procedure.  This is often from the untreated hemorrhoids rather than the treated one.  Dont be concerned if there is a tablespoon or so of blood.  If there is more blood than this, lie flat with your bottom higher than your head and apply an ice pack to the area. If the bleeding does not stop within a half an hour or if you feel faint, call our office at [phone] or go to the emergency room.  Problems are not common; however, if there is a substantial amount of bleeding, severe pain, chills, fever or difficulty passing urine (very rare) or other problems, you should call us  at [phone] or report to the nearest emergency room.  Do not stay seated continuously for more than 2-3 hours for a day or two after the procedure.   Tighten your buttock muscles 10-15 times every two hours and take 10-15 deep breaths every 1-2 hours.  Do not spend more than a few minutes on the toilet if you cannot empty your bowel; instead re-visit the toilet at a later time.   _______________________________________________________  If your blood pressure at your visit was 140/90 or greater, please contact your primary care physician to follow up on this.  _______________________________________________________  If you are age 81 or older, your body mass index should be between 23-30. Your Body mass index is 33 kg/m. If this is out of the aforementioned range listed, please consider follow up with your Primary Care Provider.  If you are age 81 or younger, your body mass index should be between 19-25. Your Body mass index is 33 kg/m. If this is out of the aformentioned range listed, please consider follow up with your Primary Care Provider.   ________________________________________________________  The [facility] providers would like to encourage you to use MYCHART to communicate with providers for non-urgent requests or questions.  Due to long hold times on the telephone, sending your provider a message by [facility] may be a faster and more efficient way to get a response.  Please allow 48 business hours for a response.  Please remember that this is for non-urgent requests.  _______________________________________________________  [facility] is using a team-based approach to care.  Your team is made up of your doctor and two  to three APPS. Our APPS (Nurse Practitioners and Physician Assistants) work with your physician to ensure care continuity for you. They are fully qualified to address your health concerns and develop a treatment plan. They communicate directly with your gastroenterologist to care for you. Seeing the Advanced Practice Practitioners on your physician's team can help you by facilitating care more promptly,  often allowing for earlier appointments, access to diagnostic testing, procedures, and other specialty referrals.

## 2024-07-04 NOTE — Progress Notes (Signed)
 Della Scrivener returns today for additional hemorrhoidal banding She is an 83 year old female with a history of symptomatic hemorrhoids, colonic polyps and diverticulosis She had hemorrhoidal banding x 2 in 2017  Recurrent symptoms include prolapse and intermittent bleeding  She has done really well with the first 2 banding's have a near resolution of all hemorrhoidal symptoms She wishes to proceed with the third hemorrhoidal band today   PROCEDURE NOTE:  The patient presents with symptomatic grade 2 internal hemorrhoids, requesting rubber band ligation of her hemorrhoidal disease.  All risks, benefits and alternative forms of therapy were described and informed consent was obtained.   The anorectum was pre-medicated with 0.125% nitroglycerin ointment The decision was made to band the RP (RA and LL banded at visit #1 and 2) internal hemorrhoid, and the [facility] ORegan System was used to perform band ligation without complication.   Digital anorectal examination was then performed to assure proper positioning of the band, and to adjust the banded tissue as required.  The patient was discharged home without pain or other issues.  Dietary and behavioral recommendations were given and along with follow-up instructions.     The patient will return as needed for follow-up and possible additional banding as required. No complications were encountered and the patient tolerated the procedure well.

## 2024-07-05 ENCOUNTER — Other Ambulatory Visit: Payer: Self-pay

## 2024-07-11 ENCOUNTER — Other Ambulatory Visit: Payer: Self-pay

## 2024-07-11 ENCOUNTER — Telehealth

## 2024-07-11 NOTE — Patient Instructions (Signed)
 Heather Shaw - I am sorry I was unable to reach you today for our scheduled appointment. I work with [name], Roselie Rockford, NP and am calling to support your healthcare needs. Please contact me at [phone] at your earliest convenience. I look forward to speaking with you soon.   Thank you,  Donnelle Rubey RN RN Care Manager [facility] [phone] Visit Information  Thank you for taking time to visit with me today. Please don't hesitate to contact me if I can be of assistance to you before our next scheduled appointment.  Your next care management appointment is by telephone on 08/09/24 at 11:30am  Telephone follow-up in 1 month  Please call the care guide team at [phone] if you need to cancel, schedule, or reschedule an appointment.   Please call the Suicide and Crisis Lifeline: 988 call the [place]  National Suicide Prevention Lifeline: (224)314-9466 or TTY: (727) 096-8311 TTY (940) 086-6309) to talk to a trained counselor call 1-800-273-TALK (toll free, 24 hour hotline) if you are experiencing a Mental Health or Behavioral Health Crisis or need someone to talk to.  Karlo Goeden RN RN Care Manager [facility] [phone]

## 2024-07-11 NOTE — Patient Outreach (Signed)
 Complex Care Management   Visit Note  07/11/2024  Name:  Heather Shaw MRN: [id number] DOB: [birth year]  Situation: Referral received for Complex Care Management related to htn  I obtained verbal consent from Patient.  Visit completed with Patient  on the phone  Background:   Past Medical History:  Diagnosis Date   Angio-edema 03/27/2021   Formatting of this note might be different from the original.  Last Assessment & Plan:   Formatting of this note might be different from the original.  Resolved.  Likely due to IV contrast (lisinopril  also d/c'd)     Arthritis    Bladder pain    Bradycardia    DDD (degenerative disc disease), lumbosacral    Grief 05/24/2018   Hyperlipidemia    Hyperplastic colon polyp    Hypertension    IC (interstitial cystitis)    Insomnia 04/06/2010   Qualifier: Diagnosis of  By: Jenetta MD, Talia     Internal hemorrhoids    Polyp of rectum    PONV (postoperative nausea and vomiting)    Right wrist tendonitis 08/24/2017   Squamous papilloma    in esophageal polyp   Uterine fibroid    Vertigo    Wears glasses    Wears partial dentures     Assessment: Patient Reported Symptoms:  Cognitive Cognitive Status: No symptoms reported Cognitive/Intellectual Conditions Management [RPT]: None reported or documented in medical history or problem list   Health Maintenance Behaviors: None Healing Pattern: Average  Neurological Neurological Review of Symptoms: No symptoms reported Neurological Management Strategies: Routine screening Neurological Self-Management Outcome: 4 (good)  HEENT HEENT Symptoms Reported: No symptoms reported HEENT Management Strategies: Routine screening HEENT Self-Management Outcome: 4 (good)    Cardiovascular Cardiovascular Symptoms Reported: No symptoms reported Does patient have uncontrolled Hypertension?: Yes Is patient checking Blood Pressure at home?: Yes Patient's Recent BP reading at home: 142/72 Cardiovascular Management  Strategies: Routine screening Cardiovascular Self-Management Outcome: 3 (uncertain)  Respiratory Respiratory Symptoms Reported: No symptoms reported Respiratory Management Strategies: Routine screening, Adequate rest Respiratory Self-Management Outcome: 4 (good)  Endocrine Endocrine Symptoms Reported: No symptoms reported Is patient diabetic?: No Endocrine Self-Management Outcome: 4 (good)  Gastrointestinal Gastrointestinal Symptoms Reported: No symptoms reported Other Gastrointestinal Symptoms: pt had hemorrhoid binding 12/10 Gastrointestinal Management Strategies: Adequate rest Gastrointestinal Self-Management Outcome: 3 (uncertain)    Genitourinary Other Genitourinary Symptoms: interstitial cystitis Genitourinary Management Strategies: Diet modification Genitourinary Self-Management Outcome: 3 (uncertain)  Integumentary Integumentary Symptoms Reported: No symptoms reported Skin Management Strategies: Routine screening Skin Self-Management Outcome: 4 (good)  Musculoskeletal   Musculoskeletal Management Strategies: Routine screening, Medication therapy Musculoskeletal Self-Management Outcome: 4 (good) Falls in the past year?: No Number of falls in past year: 1 or less Was there an injury with Fall?: No Fall Risk Category Calculator: 0 Patient Fall Risk Level: Low Fall Risk Fall risk Follow up: Falls evaluation completed, Education provided, Falls prevention discussed  Psychosocial Psychosocial Symptoms Reported: No symptoms reported Other Psychosocial Conditions: slightly stressed due to dtr's diagnosis Behavioral Health Self-Management Outcome: 4 (good) Major Change/Loss/Stressor/Fears (CP): Denies Techniques to Cope with Loss/Stress/Change: Not applicable Quality of Family Relationships: involved, supportive Do you feel physically threatened by others?: No    07/11/2024    PHQ2-9 Depression Screening   Little interest or pleasure in doing things    Feeling down, depressed,  or hopeless    PHQ-2 - Total Score    Trouble falling or staying asleep, or sleeping too much    Feeling tired or having little energy  Poor appetite or overeating     Feeling bad about yourself - or that you are a failure or have let yourself or your family down    Trouble concentrating on things, such as reading the newspaper or watching television    Moving or speaking so slowly that other people could have noticed.  Or the opposite - being so fidgety or restless that you have been moving around a lot more than usual    Thoughts that you would be better off dead, or hurting yourself in some way    PHQ2-9 Total Score    If you checked off any problems, how difficult have these problems made it for you to do your work, take care of things at home, or get along with other people    Depression Interventions/Treatment      There were no vitals filed for this visit. Pain Scale: 0-10 Pain Score: 6  Pain Type: Chronic pain Pain Location: Back Pain Orientation: Lower Pain Descriptors / Indicators: Constant Pain Onset: On-going Patients Stated Pain Goal: 3 Pain Intervention(s): Medication (See eMAR)  Medications Reviewed Today     Reviewed by Nivia, Gurman Ashland , RN (Registered Nurse) on 07/11/24 at 1444  Med List Status: <None>   Medication Order Taking? Sig Documenting Provider Last Dose Status Informant  amLODipine  (NORVASC ) 10 MG tablet [id number] Yes Take 1 tablet (10 mg total) by mouth daily. [name], Roselie Rockford, NP  Active   bisacodyl  (DULCOLAX) 5 MG EC tablet [id number] Yes Take 1 tablet (5 mg total) by mouth daily as needed for moderate constipation. Vann, Jessica U, DO  Active   carvedilol  (COREG ) 6.25 MG tablet [id number] Yes Take 1 tablet (6.25 mg total) by mouth 2 (two) times daily. Mona Vinie BROCKS, MD  Active   ezetimibe  (ZETIA ) 10 MG tablet [id number] Yes Take 1 tablet (10 mg total) by mouth daily. Mona Vinie BROCKS, MD  Active   fluocinonide -emollient (LIDEX -E) 0.05 % cream  [id number] Yes Apply 1 Application topically 2 (two) times daily as needed. [name], Roselie Rockford, NP  Active   gabapentin  (NEURONTIN ) 100 MG capsule [id number] Yes Take 1 capsule (100 mg total) by mouth in the morning AND 1 capsule (100 mg total) daily at 12 noon as needed AND 2 capsules (200 mg total) every evening. [name], Roselie Rockford, NP  Active   hydrALAZINE  (APRESOLINE ) 25 MG tablet [id number] Yes Take 1 tablet (25 mg total) by mouth 2 (two) times daily. Katheen Roselie Rockford, NP  Active   hydrochlorothiazide  (HYDRODIURIL ) 25 MG tablet [id number] Yes Take 1 tablet (25 mg total) by mouth daily. [name], Roselie Rockford, NP  Active   HYDROcodone -acetaminophen  (NORCO) 7.5-325 MG tablet [id number] Yes Take 1 tablet by mouth every 8 (eight) hours. [name], Roselie Rockford, NP  Active   HYDROcodone -acetaminophen  (NORCO) 7.5-325 MG tablet [id number]  Take 1 tablet by mouth every 8 (eight) hours. [name], Roselie Rockford, NP  Active   HYDROcodone -acetaminophen  (NORCO) 7.5-325 MG tablet [id number]  Take 1 tablet by mouth every 8 (eight) hours. [name], Roselie Rockford, NP  Active   meclizine  (ANTIVERT ) 25 MG tablet [id number] Yes Take 25 mg by mouth 3 (three) times daily as needed for dizziness. [provider]  Active   Meth-Hyo-M Bl-Na Phos-Ph Sal (URO-MP) 118 MG CAPS [id number]  Take 1 capsule by mouth 3 (three) times daily as needed.  Patient not taking: Reported on 07/11/2024   [provider]  Active   traZODone  (DESYREL ) 50 MG tablet [id number] Yes Take 1/2 tablet (25 mg  total) by mouth at bedtime as needed for sleep. [name], Roselie Rockford, NP  Active   Vitamin D , Ergocalciferol , (DRISDOL ) 1.25 MG (50000 UNIT) CAPS capsule [id number] Yes Take 1 capsule (50,000 Units total) by mouth every 7 (seven) days. [name], Roselie Rockford, NP  Active             Recommendation:   Continue Current Plan of Care  Follow Up Plan:   Telephone follow-up in 1 month  Dora Simeone RN RN Care Manager [facility] [phone]

## 2024-07-27 ENCOUNTER — Other Ambulatory Visit (HOSPITAL_COMMUNITY): Payer: Self-pay

## 2024-07-27 ENCOUNTER — Encounter: Payer: Self-pay | Admitting: Nurse Practitioner

## 2024-07-27 ENCOUNTER — Ambulatory Visit (INDEPENDENT_AMBULATORY_CARE_PROVIDER_SITE_OTHER): Admitting: Nurse Practitioner

## 2024-07-27 VITALS — BP 134/72 | HR 83 | Temp 97.9°F | Ht 64.5 in | Wt 191.0 lb

## 2024-07-27 DIAGNOSIS — I10 Essential (primary) hypertension: Secondary | ICD-10-CM | POA: Diagnosis not present

## 2024-07-27 NOTE — Progress Notes (Signed)
" ° °  Established Patient Office Visit  Subjective   Patient ID: Heather Shaw, female    DOB: 1941-01-07  Age: 84 y.o. MRN: [id number]  Chief Complaint  Patient presents with   Hypertension    Follow up    HPI  Discussed the use of AI scribe software for clinical note transcription with the patient, who gave verbal consent to proceed.  History of Present Illness   Heather Shaw is an 84 year old female with hypertension who presents for a follow-up on her blood pressure management.  She monitors her blood pressure at home and notes consistently elevated systolic readings with good diastolic values. She recently restarted hydrochlorothiazide  after it was held for a surgical procedure. Her current regimen includes amlodipine , carvedilol , and HCTZ. She denies chest pain, shortness of breath, headaches, or lower extremity swelling, and notes headaches only with sinus infections.       ROS See pertinent positives and negatives per HPI.    Objective:     BP 134/72 (BP Location: Right Arm, Patient Position: Sitting, Cuff Size: Normal)   Pulse 83   Temp 97.9 F (36.6 C)   Ht 5' 4.5 (1.638 m)   Wt 191 lb (86.6 kg)   SpO2 96%   BMI 32.28 kg/m  BP Readings from Last 3 Encounters:  07/27/24 134/72  07/04/24 110/62  06/15/24 (!) 150/80   Wt Readings from Last 3 Encounters:  07/27/24 191 lb (86.6 kg)  07/04/24 195 lb 4 oz (88.6 kg)  06/15/24 195 lb (88.5 kg)      Physical Exam Vitals and nursing note reviewed.  Constitutional:      General: She is not in acute distress.    Appearance: Normal appearance.  HENT:     Head: Normocephalic.  Eyes:     Conjunctiva/sclera: Conjunctivae normal.  Cardiovascular:     Rate and Rhythm: Normal rate and regular rhythm.     Pulses: Normal pulses.     Heart sounds: Normal heart sounds.  Pulmonary:     Effort: Pulmonary effort is normal.     Breath sounds: Normal breath sounds.  Musculoskeletal:     Cervical back: Normal  range of motion.  Skin:    General: Skin is warm.  Neurological:     General: No focal deficit present.     Mental Status: She is alert and oriented to person, place, and time.  Psychiatric:        Mood and Affect: Mood normal.        Behavior: Behavior normal.        Thought Content: Thought content normal.        Judgment: Judgment normal.      Assessment & Plan:   Problem List Items Addressed This Visit       Cardiovascular and Mediastinum   Essential hypertension - Primary   Chronic, stable. Blood pressure is well-controlled with the current regimen. She reports no symptoms and has resumed HCTZ last month. Continue HCTZ 25mg  daily, amlodipine  10mg  daily, and carvedilol  6.25mg  BID. Check BMP today. Monitor blood pressure at home. Follow-up with PCP in 3 months.      Relevant Orders   Basic metabolic panel with GFR    Return in about 3 months (around 10/25/2024) for HTN with PCP.    Tinnie DELENA Harada, NP  "

## 2024-07-27 NOTE — Patient Instructions (Signed)
 It was great to see you!  We are checking your labs today and will let you know the results via mychart/phone.   Keep taking your medication   Let's follow-up in 2 months, sooner if you have concerns.  If a referral was placed today, you will be contacted for an appointment. Please note that routine referrals can sometimes take up to 3-4 weeks to process. Please call our office if you haven't heard anything after this time frame.  Take care,  Tinnie Harada, NP

## 2024-07-27 NOTE — Assessment & Plan Note (Signed)
 Chronic, stable. Blood pressure is well-controlled with the current regimen. She reports no symptoms and has resumed HCTZ last month. Continue HCTZ 25mg  daily, amlodipine  10mg  daily, and carvedilol  6.25mg  BID. Check BMP today. Monitor blood pressure at home. Follow-up with PCP in 3 months.

## 2024-07-28 LAB — BASIC METABOLIC PANEL WITH GFR
BUN: 13 mg/dL (ref 7–25)
CO2: 25 mmol/L (ref 20–32)
Calcium: 9.4 mg/dL (ref 8.6–10.4)
Chloride: 105 mmol/L (ref 98–110)
Creat: 0.72 mg/dL (ref 0.60–0.95)
Glucose, Bld: 91 mg/dL (ref 65–99)
Potassium: 3.6 mmol/L (ref 3.5–5.3)
Sodium: 138 mmol/L (ref 135–146)
eGFR: 84 mL/min/1.73m2

## 2024-07-30 ENCOUNTER — Other Ambulatory Visit (HOSPITAL_COMMUNITY): Payer: Self-pay

## 2024-07-30 ENCOUNTER — Ambulatory Visit: Payer: Self-pay | Admitting: Nurse Practitioner

## 2024-08-08 ENCOUNTER — Telehealth: Payer: Self-pay

## 2024-08-09 ENCOUNTER — Other Ambulatory Visit: Payer: Self-pay

## 2024-08-09 NOTE — Patient Instructions (Signed)
 Visit Information  Thank you for taking time to visit with me today. Please don't hesitate to contact me if I can be of assistance to you before our next scheduled appointment.  Your next care management appointment is by telephone on 09/07/24 at 1 00 pm  Please call the care guide team at [phone] if you need to cancel, schedule, or reschedule an appointment.   Please call the Suicide and Crisis Lifeline: 988 call the [place]  National Suicide Prevention Lifeline: (262)128-6342 or TTY: (818)537-8213 TTY 956-302-4127) to talk to a trained counselor if you are experiencing a Mental Health or Behavioral Health Crisis or need someone to talk to.  Heddy Shutter, RN, MSN, BSN, CCM [facility], Population Health Case Manager Phone: [phone]

## 2024-08-09 NOTE — Patient Outreach (Signed)
 Complex Care Management   Visit Note  08/09/2024  Name:  Heather Shaw MRN: [id number] DOB: Oct 17, 1940  Situation: Referral received for Complex Care Management related to HTN and Chronic pain I obtained verbal consent from Patient.  Visit completed with Patient  on the phone  Background:   Past Medical History:  Diagnosis Date   Angio-edema 03/27/2021   Formatting of this note might be different from the original.  Last Assessment & Plan:   Formatting of this note might be different from the original.  Resolved.  Likely due to IV contrast (lisinopril  also d/c'd)     Arthritis    Bladder pain    Bradycardia    DDD (degenerative disc disease), lumbosacral    Grief 05/24/2018   Hyperlipidemia    Hyperplastic colon polyp    Hypertension    IC (interstitial cystitis)    Insomnia 04/06/2010   Qualifier: Diagnosis of  By: Jenetta MD, Talia     Internal hemorrhoids    Polyp of rectum    PONV (postoperative nausea and vomiting)    Right wrist tendonitis 08/24/2017   Squamous papilloma    in esophageal polyp   Uterine fibroid    Vertigo    Wears glasses    Wears partial dentures     Assessment: Patient Reported Symptoms:  Cognitive Cognitive Status: No symptoms reported      Neurological Neurological Review of Symptoms: No symptoms reported    HEENT HEENT Symptoms Reported: No symptoms reported      Cardiovascular Cardiovascular Symptoms Reported: No symptoms reported Does patient have uncontrolled Hypertension?: No Patient's Recent BP reading at home: 134/72 HR 83 at PCP visit 07/27/24    Respiratory Respiratory Symptoms Reported: No symptoms reported    Endocrine Endocrine Symptoms Reported: No symptoms reported Is patient diabetic?: No    Gastrointestinal Gastrointestinal Symptoms Reported: No symptoms reported      Genitourinary Genitourinary Symptoms Reported: Other Other Genitourinary Symptoms: History of interstitial Cystitis. Patient reports is managed at  this time. she states she has to continue to monitor what she eat and avoid foods that cause exacerbation. she continues to attend provider visits as prescribed.    Integumentary Integumentary Symptoms Reported: No symptoms reported    Musculoskeletal Musculoskelatal Symptoms Reviewed: Joint pain, Back pain Additional Musculoskeletal Details: patient reports chronic pain managed with medications. She states she continues to attend provider visits as schedule and will call as needed.        Psychosocial Psychosocial Symptoms Reported: Not assessed          Today's Vitals   08/09/24 1214  BP: 135/78  Pulse: (!) 57      Medications Reviewed Today     Reviewed by Celesta Funderburk M, RN (Registered Nurse) on 08/09/24 at 1225  Med List Status: <None>   Medication Order Taking? Sig Documenting Provider Last Dose Status Informant  amLODipine  (NORVASC ) 10 MG tablet [id number] Yes Take 1 tablet (10 mg total) by mouth daily. [name], Roselie Rockford, NP  Active   bisacodyl  (DULCOLAX) 5 MG EC tablet [id number] Yes Take 1 tablet (5 mg total) by mouth daily as needed for moderate constipation. Vann, Jessica U, DO  Active   carvedilol  (COREG ) 6.25 MG tablet [id number] Yes Take 1 tablet (6.25 mg total) by mouth 2 (two) times daily. Mona Vinie BROCKS, MD  Active   ezetimibe  (ZETIA ) 10 MG tablet [id number] Yes Take 1 tablet (10 mg total) by mouth daily. Mona Vinie BROCKS, MD  Active  fluocinonide -emollient (LIDEX -E) 0.05 % cream [id number] Yes Apply 1 Application topically 2 (two) times daily as needed. [name], Roselie Rockford, NP  Active   gabapentin  (NEURONTIN ) 100 MG capsule [id number] Yes Take 1 capsule (100 mg total) by mouth in the morning AND 1 capsule (100 mg total) daily at 12 noon as needed AND 2 capsules (200 mg total) every evening. [name], Roselie Rockford, NP  Active   hydrALAZINE  (APRESOLINE ) 25 MG tablet [id number] Yes Take 1 tablet (25 mg total) by mouth 2 (two) times daily. Katheen Roselie Rockford, NP  Active    hydrochlorothiazide  (HYDRODIURIL ) 25 MG tablet [id number] Yes Take 1 tablet (25 mg total) by mouth daily. [name], Roselie Rockford, NP  Active   HYDROcodone -acetaminophen  (NORCO) 7.5-325 MG tablet [id number] Yes Take 1 tablet by mouth every 8 (eight) hours. [name], Roselie Rockford, NP  Active   HYDROcodone -acetaminophen  (NORCO) 7.5-325 MG tablet [id number]  Take 1 tablet by mouth every 8 (eight) hours. [name], Roselie Rockford, NP  Active   HYDROcodone -acetaminophen  (NORCO) 7.5-325 MG tablet [id number]  Take 1 tablet by mouth every 8 (eight) hours. [name], Roselie Rockford, NP  Active   meclizine  (ANTIVERT ) 25 MG tablet [id number] Yes Take 25 mg by mouth 3 (three) times daily as needed for dizziness. [provider]  Active   Meth-Hyo-M Bl-Na Phos-Ph Sal (URO-MP) 118 MG CAPS [id number] Yes Take 1 capsule by mouth 3 (three) times daily as needed. [provider]  Active   traZODone  (DESYREL ) 50 MG tablet [id number]  Take 1/2 tablet (25 mg total) by mouth at bedtime as needed for sleep.  Patient not taking: Reported on 08/09/2024   [name], Roselie Rockford, NP  Active   Vitamin D , Ergocalciferol , (DRISDOL ) 1.25 MG (50000 UNIT) CAPS capsule [id number] Yes Take 1 capsule (50,000 Units total) by mouth every 7 (seven) days. [name], Roselie Rockford, NP  Active           Recommendation:   Continue Current Plan of Care  Follow Up Plan:   Telephone follow up appointment date/time:  09/07/24 with Elida Pulse, RNCM  Heddy Shutter, RN, MSN, BSN, CCM [place]  [facility], Population Health Case Manager Phone: [phone]

## 2024-08-26 ENCOUNTER — Other Ambulatory Visit: Payer: Self-pay

## 2024-08-29 ENCOUNTER — Other Ambulatory Visit (HOSPITAL_COMMUNITY): Payer: Self-pay

## 2024-08-30 ENCOUNTER — Other Ambulatory Visit: Payer: Self-pay

## 2024-08-30 ENCOUNTER — Other Ambulatory Visit (HOSPITAL_COMMUNITY): Payer: Self-pay

## 2024-09-07 ENCOUNTER — Telehealth

## 2024-10-30 ENCOUNTER — Ambulatory Visit: Admitting: Nurse Practitioner

## 2024-11-02 ENCOUNTER — Ambulatory Visit

## 2024-11-06 ENCOUNTER — Ambulatory Visit
